# Patient Record
Sex: Female | Born: 1935
Health system: Southern US, Community
[De-identification: ages and names within clinical notes are randomized; demographics above are authoritative.]

## PROBLEM LIST (undated history)

## (undated) DIAGNOSIS — L80 Vitiligo: Secondary | ICD-10-CM

## (undated) DIAGNOSIS — K5730 Diverticulosis of large intestine without perforation or abscess without bleeding: Secondary | ICD-10-CM

## (undated) DIAGNOSIS — K589 Irritable bowel syndrome without diarrhea: Secondary | ICD-10-CM

## (undated) DIAGNOSIS — K635 Polyp of colon: Secondary | ICD-10-CM

## (undated) DIAGNOSIS — J3489 Other specified disorders of nose and nasal sinuses: Secondary | ICD-10-CM

## (undated) DIAGNOSIS — C50919 Malignant neoplasm of unspecified site of unspecified female breast: Secondary | ICD-10-CM

## (undated) DIAGNOSIS — K648 Other hemorrhoids: Secondary | ICD-10-CM

## (undated) DIAGNOSIS — M1990 Unspecified osteoarthritis, unspecified site: Secondary | ICD-10-CM

## (undated) DIAGNOSIS — Z972 Presence of dental prosthetic device (complete) (partial): Secondary | ICD-10-CM

## (undated) DIAGNOSIS — E7800 Pure hypercholesterolemia, unspecified: Secondary | ICD-10-CM

## (undated) DIAGNOSIS — H9190 Unspecified hearing loss, unspecified ear: Secondary | ICD-10-CM

## (undated) DIAGNOSIS — E039 Hypothyroidism, unspecified: Secondary | ICD-10-CM

## (undated) DIAGNOSIS — F419 Anxiety disorder, unspecified: Secondary | ICD-10-CM

## (undated) DIAGNOSIS — Z800 Family history of malignant neoplasm of digestive organs: Secondary | ICD-10-CM

## (undated) DIAGNOSIS — Z973 Presence of spectacles and contact lenses: Secondary | ICD-10-CM

## (undated) DIAGNOSIS — M1711 Unilateral primary osteoarthritis, right knee: Secondary | ICD-10-CM

## (undated) HISTORY — PX: BUNIONECTOMY: SHX129

## (undated) HISTORY — PX: OTHER SURGICAL HISTORY: SHX169

## (undated) HISTORY — PX: ABDOMINAL HYSTERECTOMY: SHX81

## (undated) HISTORY — PX: TONSILLECTOMY: SUR1361

## (undated) HISTORY — DX: Unspecified osteoarthritis, unspecified site: M19.90

## (undated) HISTORY — DX: Family history of malignant neoplasm of digestive organs: Z80.0

## (undated) HISTORY — DX: Unilateral primary osteoarthritis, right knee: M17.11

## (undated) HISTORY — DX: Other hemorrhoids: K64.8

## (undated) HISTORY — DX: Polyp of colon: K63.5

## (undated) HISTORY — PX: EYE SURGERY: SHX253

## (undated) HISTORY — DX: Diverticulosis of large intestine without perforation or abscess without bleeding: K57.30

---

## 1999-09-10 ENCOUNTER — Encounter: Admission: RE | Admit: 1999-09-10 | Discharge: 1999-09-10 | Payer: Self-pay | Admitting: Family Medicine

## 1999-09-10 ENCOUNTER — Encounter: Payer: Self-pay | Admitting: Family Medicine

## 2001-01-06 ENCOUNTER — Encounter: Admission: RE | Admit: 2001-01-06 | Discharge: 2001-01-06 | Payer: Self-pay | Admitting: Family Medicine

## 2001-01-06 ENCOUNTER — Encounter: Payer: Self-pay | Admitting: Family Medicine

## 2001-03-02 HISTORY — PX: WRIST FRACTURE SURGERY: SHX121

## 2001-03-02 HISTORY — PX: OTHER SURGICAL HISTORY: SHX169

## 2001-03-22 ENCOUNTER — Encounter: Payer: Self-pay | Admitting: Family Medicine

## 2001-03-22 ENCOUNTER — Encounter: Admission: RE | Admit: 2001-03-22 | Discharge: 2001-03-22 | Payer: Self-pay | Admitting: Family Medicine

## 2001-05-20 ENCOUNTER — Encounter: Admission: RE | Admit: 2001-05-20 | Discharge: 2001-05-20 | Payer: Self-pay | Admitting: Orthopaedic Surgery

## 2001-05-20 ENCOUNTER — Encounter: Payer: Self-pay | Admitting: Orthopaedic Surgery

## 2001-05-24 ENCOUNTER — Ambulatory Visit (HOSPITAL_BASED_OUTPATIENT_CLINIC_OR_DEPARTMENT_OTHER): Admission: RE | Admit: 2001-05-24 | Discharge: 2001-05-24 | Payer: Self-pay | Admitting: Orthopaedic Surgery

## 2001-11-01 ENCOUNTER — Ambulatory Visit (HOSPITAL_BASED_OUTPATIENT_CLINIC_OR_DEPARTMENT_OTHER): Admission: RE | Admit: 2001-11-01 | Discharge: 2001-11-01 | Payer: Self-pay | Admitting: Orthopaedic Surgery

## 2002-03-02 HISTORY — PX: BREAST LUMPECTOMY: SHX2

## 2002-03-24 ENCOUNTER — Encounter: Payer: Self-pay | Admitting: Internal Medicine

## 2002-03-24 ENCOUNTER — Encounter: Admission: RE | Admit: 2002-03-24 | Discharge: 2002-03-24 | Payer: Self-pay | Admitting: Internal Medicine

## 2002-03-30 ENCOUNTER — Other Ambulatory Visit: Admission: RE | Admit: 2002-03-30 | Discharge: 2002-03-30 | Payer: Self-pay | Admitting: Diagnostic Radiology

## 2002-03-30 ENCOUNTER — Encounter (INDEPENDENT_AMBULATORY_CARE_PROVIDER_SITE_OTHER): Payer: Self-pay | Admitting: Specialist

## 2002-03-30 ENCOUNTER — Encounter: Admission: RE | Admit: 2002-03-30 | Discharge: 2002-03-30 | Payer: Self-pay | Admitting: Internal Medicine

## 2002-03-30 ENCOUNTER — Encounter: Payer: Self-pay | Admitting: Internal Medicine

## 2002-04-04 ENCOUNTER — Encounter: Payer: Self-pay | Admitting: Surgery

## 2002-04-04 ENCOUNTER — Ambulatory Visit (HOSPITAL_COMMUNITY): Admission: RE | Admit: 2002-04-04 | Discharge: 2002-04-04 | Payer: Self-pay | Admitting: Surgery

## 2002-04-17 ENCOUNTER — Encounter: Admission: RE | Admit: 2002-04-17 | Discharge: 2002-04-17 | Payer: Self-pay | Admitting: Surgery

## 2002-04-17 ENCOUNTER — Encounter: Payer: Self-pay | Admitting: Surgery

## 2002-04-17 ENCOUNTER — Encounter (INDEPENDENT_AMBULATORY_CARE_PROVIDER_SITE_OTHER): Payer: Self-pay | Admitting: Specialist

## 2002-04-17 ENCOUNTER — Ambulatory Visit (HOSPITAL_BASED_OUTPATIENT_CLINIC_OR_DEPARTMENT_OTHER): Admission: RE | Admit: 2002-04-17 | Discharge: 2002-04-17 | Payer: Self-pay | Admitting: Surgery

## 2002-04-17 ENCOUNTER — Encounter: Payer: Self-pay | Admitting: General Surgery

## 2002-05-02 ENCOUNTER — Ambulatory Visit: Admission: RE | Admit: 2002-05-02 | Discharge: 2002-07-06 | Payer: Self-pay | Admitting: Radiation Oncology

## 2002-05-09 ENCOUNTER — Ambulatory Visit (HOSPITAL_COMMUNITY): Admission: RE | Admit: 2002-05-09 | Discharge: 2002-05-09 | Payer: Self-pay | Admitting: Oncology

## 2002-05-09 ENCOUNTER — Encounter: Payer: Self-pay | Admitting: Oncology

## 2002-07-27 ENCOUNTER — Encounter: Admission: RE | Admit: 2002-07-27 | Discharge: 2002-07-27 | Payer: Self-pay | Admitting: Oncology

## 2002-07-27 ENCOUNTER — Encounter: Payer: Self-pay | Admitting: Oncology

## 2002-09-05 ENCOUNTER — Other Ambulatory Visit: Admission: RE | Admit: 2002-09-05 | Discharge: 2002-09-05 | Payer: Self-pay | Admitting: Internal Medicine

## 2002-10-26 ENCOUNTER — Encounter: Admission: RE | Admit: 2002-10-26 | Discharge: 2002-10-26 | Payer: Self-pay | Admitting: General Surgery

## 2002-10-26 ENCOUNTER — Encounter: Payer: Self-pay | Admitting: General Surgery

## 2002-11-08 ENCOUNTER — Encounter (INDEPENDENT_AMBULATORY_CARE_PROVIDER_SITE_OTHER): Payer: Self-pay | Admitting: *Deleted

## 2002-11-08 DIAGNOSIS — K648 Other hemorrhoids: Secondary | ICD-10-CM

## 2003-03-27 ENCOUNTER — Encounter: Admission: RE | Admit: 2003-03-27 | Discharge: 2003-03-27 | Payer: Self-pay | Admitting: General Surgery

## 2003-12-13 ENCOUNTER — Ambulatory Visit (HOSPITAL_COMMUNITY): Admission: RE | Admit: 2003-12-13 | Discharge: 2003-12-13 | Payer: Self-pay | Admitting: Oncology

## 2004-04-02 ENCOUNTER — Ambulatory Visit: Payer: Self-pay | Admitting: Oncology

## 2004-04-04 ENCOUNTER — Encounter: Admission: RE | Admit: 2004-04-04 | Discharge: 2004-04-04 | Payer: Self-pay | Admitting: General Surgery

## 2004-10-09 ENCOUNTER — Ambulatory Visit (HOSPITAL_COMMUNITY): Admission: RE | Admit: 2004-10-09 | Discharge: 2004-10-09 | Payer: Self-pay | Admitting: Internal Medicine

## 2005-04-02 ENCOUNTER — Ambulatory Visit: Payer: Self-pay | Admitting: Oncology

## 2005-04-16 ENCOUNTER — Encounter: Admission: RE | Admit: 2005-04-16 | Discharge: 2005-04-16 | Payer: Self-pay | Admitting: Oncology

## 2005-09-11 ENCOUNTER — Other Ambulatory Visit: Admission: RE | Admit: 2005-09-11 | Discharge: 2005-09-11 | Payer: Self-pay | Admitting: Internal Medicine

## 2006-03-29 ENCOUNTER — Ambulatory Visit: Payer: Self-pay | Admitting: Oncology

## 2006-04-01 LAB — CBC WITH DIFFERENTIAL/PLATELET
BASO%: 0.6 % (ref 0.0–2.0)
Basophils Absolute: 0 10*3/uL (ref 0.0–0.1)
EOS%: 1.7 % (ref 0.0–7.0)
HGB: 13.2 g/dL (ref 11.6–15.9)
MCH: 30.6 pg (ref 26.0–34.0)
MCHC: 34.6 g/dL (ref 32.0–36.0)
MCV: 88.5 fL (ref 81.0–101.0)
MONO%: 9.1 % (ref 0.0–13.0)
RBC: 4.32 10*6/uL (ref 3.70–5.32)
RDW: 13.3 % (ref 11.3–14.5)
lymph#: 1.6 10*3/uL (ref 0.9–3.3)

## 2006-04-01 LAB — COMPREHENSIVE METABOLIC PANEL
AST: 15 U/L (ref 0–37)
Albumin: 4.3 g/dL (ref 3.5–5.2)
Alkaline Phosphatase: 56 U/L (ref 39–117)
Glucose, Bld: 93 mg/dL (ref 70–99)
Potassium: 4.2 mEq/L (ref 3.5–5.3)
Sodium: 143 mEq/L (ref 135–145)
Total Bilirubin: 0.8 mg/dL (ref 0.3–1.2)
Total Protein: 6.8 g/dL (ref 6.0–8.3)

## 2006-04-01 LAB — CANCER ANTIGEN 27.29: CA 27.29: 24 U/mL (ref 0–39)

## 2006-04-01 LAB — LIPID PANEL
LDL Cholesterol: 148 mg/dL — ABNORMAL HIGH (ref 0–99)
Total CHOL/HDL Ratio: 4.4 Ratio
Triglycerides: 207 mg/dL — ABNORMAL HIGH (ref <150)
VLDL: 41 mg/dL — ABNORMAL HIGH (ref 0–40)

## 2006-04-12 ENCOUNTER — Encounter: Admission: RE | Admit: 2006-04-12 | Discharge: 2006-04-12 | Payer: Self-pay | Admitting: Oncology

## 2006-04-19 ENCOUNTER — Encounter: Admission: RE | Admit: 2006-04-19 | Discharge: 2006-04-19 | Payer: Self-pay | Admitting: Oncology

## 2006-04-26 ENCOUNTER — Encounter: Admission: RE | Admit: 2006-04-26 | Discharge: 2006-04-26 | Payer: Self-pay | Admitting: Oncology

## 2006-10-05 ENCOUNTER — Ambulatory Visit: Payer: Self-pay | Admitting: Oncology

## 2006-10-07 LAB — CBC WITH DIFFERENTIAL/PLATELET
BASO%: 0.4 % (ref 0.0–2.0)
Basophils Absolute: 0 10*3/uL (ref 0.0–0.1)
EOS%: 2.4 % (ref 0.0–7.0)
HGB: 12.9 g/dL (ref 11.6–15.9)
MCH: 30.5 pg (ref 26.0–34.0)
RBC: 4.22 10*6/uL (ref 3.70–5.32)
RDW: 13.6 % (ref 11.3–14.5)
lymph#: 1.8 10*3/uL (ref 0.9–3.3)

## 2006-10-07 LAB — COMPREHENSIVE METABOLIC PANEL
ALT: 14 U/L (ref 0–35)
AST: 16 U/L (ref 0–37)
Albumin: 4 g/dL (ref 3.5–5.2)
Alkaline Phosphatase: 57 U/L (ref 39–117)
BUN: 18 mg/dL (ref 6–23)
Calcium: 9.5 mg/dL (ref 8.4–10.5)
Chloride: 106 mEq/L (ref 96–112)
Potassium: 4 mEq/L (ref 3.5–5.3)
Sodium: 143 mEq/L (ref 135–145)
Total Protein: 6.6 g/dL (ref 6.0–8.3)

## 2007-03-03 DIAGNOSIS — K5730 Diverticulosis of large intestine without perforation or abscess without bleeding: Secondary | ICD-10-CM

## 2007-03-03 DIAGNOSIS — K635 Polyp of colon: Secondary | ICD-10-CM

## 2007-03-03 HISTORY — PX: CYSTOCELE REPAIR: SHX163

## 2007-03-03 HISTORY — DX: Diverticulosis of large intestine without perforation or abscess without bleeding: K57.30

## 2007-03-03 HISTORY — PX: COLONOSCOPY: SHX174

## 2007-03-03 HISTORY — DX: Polyp of colon: K63.5

## 2007-04-05 ENCOUNTER — Ambulatory Visit: Payer: Self-pay | Admitting: Oncology

## 2007-04-07 LAB — COMPREHENSIVE METABOLIC PANEL
Albumin: 4.2 g/dL (ref 3.5–5.2)
Alkaline Phosphatase: 51 U/L (ref 39–117)
BUN: 17 mg/dL (ref 6–23)
CO2: 25 mEq/L (ref 19–32)
Glucose, Bld: 107 mg/dL — ABNORMAL HIGH (ref 70–99)
Potassium: 4 mEq/L (ref 3.5–5.3)
Sodium: 141 mEq/L (ref 135–145)
Total Protein: 6.4 g/dL (ref 6.0–8.3)

## 2007-04-07 LAB — CBC WITH DIFFERENTIAL/PLATELET
Basophils Absolute: 0 10*3/uL (ref 0.0–0.1)
Eosinophils Absolute: 0.1 10*3/uL (ref 0.0–0.5)
HGB: 12.6 g/dL (ref 11.6–15.9)
LYMPH%: 30.9 % (ref 14.0–48.0)
MCV: 87.3 fL (ref 81.0–101.0)
MONO#: 0.5 10*3/uL (ref 0.1–0.9)
MONO%: 11.1 % (ref 0.0–13.0)
NEUT#: 2.6 10*3/uL (ref 1.5–6.5)
Platelets: 205 10*3/uL (ref 145–400)
RDW: 12.9 % (ref 11.3–14.5)
WBC: 4.7 10*3/uL (ref 3.9–10.0)

## 2007-04-07 LAB — LACTATE DEHYDROGENASE: LDH: 146 U/L (ref 94–250)

## 2007-04-07 LAB — CANCER ANTIGEN 27.29: CA 27.29: 13 U/mL (ref 0–39)

## 2007-04-27 ENCOUNTER — Encounter: Admission: RE | Admit: 2007-04-27 | Discharge: 2007-04-27 | Payer: Self-pay | Admitting: Oncology

## 2007-09-16 ENCOUNTER — Encounter (INDEPENDENT_AMBULATORY_CARE_PROVIDER_SITE_OTHER): Payer: Self-pay | Admitting: Urology

## 2007-09-16 ENCOUNTER — Ambulatory Visit (HOSPITAL_BASED_OUTPATIENT_CLINIC_OR_DEPARTMENT_OTHER): Admission: RE | Admit: 2007-09-16 | Discharge: 2007-09-16 | Payer: Self-pay | Admitting: Urology

## 2007-12-05 DIAGNOSIS — E039 Hypothyroidism, unspecified: Secondary | ICD-10-CM

## 2007-12-05 DIAGNOSIS — M1990 Unspecified osteoarthritis, unspecified site: Secondary | ICD-10-CM

## 2007-12-07 ENCOUNTER — Ambulatory Visit: Payer: Self-pay | Admitting: Gastroenterology

## 2007-12-07 DIAGNOSIS — Z853 Personal history of malignant neoplasm of breast: Secondary | ICD-10-CM

## 2008-01-10 ENCOUNTER — Ambulatory Visit: Payer: Self-pay | Admitting: Gastroenterology

## 2008-01-12 ENCOUNTER — Encounter: Payer: Self-pay | Admitting: Gastroenterology

## 2008-01-12 ENCOUNTER — Ambulatory Visit: Payer: Self-pay | Admitting: Gastroenterology

## 2008-01-16 ENCOUNTER — Encounter: Payer: Self-pay | Admitting: Gastroenterology

## 2008-04-04 ENCOUNTER — Ambulatory Visit: Payer: Self-pay | Admitting: Oncology

## 2008-04-05 LAB — CBC WITH DIFFERENTIAL/PLATELET
BASO%: 0.5 % (ref 0.0–2.0)
EOS%: 1.7 % (ref 0.0–7.0)
HCT: 39.1 % (ref 34.8–46.6)
MCH: 30.1 pg (ref 26.0–34.0)
MCHC: 34.2 g/dL (ref 32.0–36.0)
MONO#: 0.5 10*3/uL (ref 0.1–0.9)
NEUT%: 65.2 % (ref 39.6–76.8)
RBC: 4.44 10*6/uL (ref 3.70–5.32)
RDW: 13.6 % (ref 11.3–14.5)
WBC: 6.1 10*3/uL (ref 3.9–10.0)
lymph#: 1.5 10*3/uL (ref 0.9–3.3)

## 2008-04-06 LAB — COMPREHENSIVE METABOLIC PANEL
ALT: 12 U/L (ref 0–35)
AST: 15 U/L (ref 0–37)
CO2: 26 mEq/L (ref 19–32)
Calcium: 9.6 mg/dL (ref 8.4–10.5)
Chloride: 103 mEq/L (ref 96–112)
Creatinine, Ser: 0.78 mg/dL (ref 0.40–1.20)
Sodium: 139 mEq/L (ref 135–145)
Total Protein: 6.7 g/dL (ref 6.0–8.3)

## 2008-04-30 ENCOUNTER — Encounter: Admission: RE | Admit: 2008-04-30 | Discharge: 2008-04-30 | Payer: Self-pay | Admitting: Oncology

## 2009-04-12 ENCOUNTER — Ambulatory Visit: Payer: Self-pay | Admitting: Oncology

## 2009-04-12 LAB — CANCER ANTIGEN 27.29: CA 27.29: 15 U/mL (ref 0–39)

## 2009-04-12 LAB — COMPREHENSIVE METABOLIC PANEL
ALT: 18 U/L (ref 0–35)
Albumin: 4.4 g/dL (ref 3.5–5.2)
CO2: 29 mEq/L (ref 19–32)
Calcium: 9.9 mg/dL (ref 8.4–10.5)
Chloride: 105 mEq/L (ref 96–112)
Glucose, Bld: 84 mg/dL (ref 70–99)
Potassium: 4.3 mEq/L (ref 3.5–5.3)
Sodium: 143 mEq/L (ref 135–145)
Total Bilirubin: 1.1 mg/dL (ref 0.3–1.2)
Total Protein: 6.8 g/dL (ref 6.0–8.3)

## 2009-04-12 LAB — LACTATE DEHYDROGENASE: LDH: 168 U/L (ref 94–250)

## 2009-04-12 LAB — CBC WITH DIFFERENTIAL/PLATELET
Basophils Absolute: 0 10*3/uL (ref 0.0–0.1)
EOS%: 1.9 % (ref 0.0–7.0)
Eosinophils Absolute: 0.1 10*3/uL (ref 0.0–0.5)
HCT: 41.5 % (ref 34.8–46.6)
HGB: 13.9 g/dL (ref 11.6–15.9)
MCH: 30.2 pg (ref 25.1–34.0)
NEUT#: 4.2 10*3/uL (ref 1.5–6.5)
NEUT%: 62.4 % (ref 38.4–76.8)
RDW: 13.6 % (ref 11.2–14.5)
lymph#: 1.9 10*3/uL (ref 0.9–3.3)

## 2009-05-01 ENCOUNTER — Encounter: Admission: RE | Admit: 2009-05-01 | Discharge: 2009-05-01 | Payer: Self-pay | Admitting: Oncology

## 2009-09-19 ENCOUNTER — Other Ambulatory Visit: Admission: RE | Admit: 2009-09-19 | Discharge: 2009-09-19 | Payer: Self-pay | Admitting: Internal Medicine

## 2009-10-03 ENCOUNTER — Ambulatory Visit (HOSPITAL_COMMUNITY): Admission: RE | Admit: 2009-10-03 | Discharge: 2009-10-03 | Payer: Self-pay | Admitting: Internal Medicine

## 2009-10-17 ENCOUNTER — Ambulatory Visit (HOSPITAL_COMMUNITY): Admission: RE | Admit: 2009-10-17 | Discharge: 2009-10-17 | Payer: Self-pay | Admitting: Internal Medicine

## 2010-03-23 ENCOUNTER — Encounter: Payer: Self-pay | Admitting: Oncology

## 2010-03-23 ENCOUNTER — Encounter: Payer: Self-pay | Admitting: Internal Medicine

## 2010-03-28 ENCOUNTER — Other Ambulatory Visit: Payer: Self-pay | Admitting: Internal Medicine

## 2010-03-28 DIAGNOSIS — Z1239 Encounter for other screening for malignant neoplasm of breast: Secondary | ICD-10-CM

## 2010-05-07 ENCOUNTER — Ambulatory Visit
Admission: RE | Admit: 2010-05-07 | Discharge: 2010-05-07 | Disposition: A | Discharge status: Home / Self Care | Payer: MEDICARE | Source: Ambulatory Visit | Attending: Internal Medicine | Admitting: Internal Medicine

## 2010-05-07 DIAGNOSIS — Z1239 Encounter for other screening for malignant neoplasm of breast: Secondary | ICD-10-CM

## 2010-07-15 NOTE — Op Note (Signed)
NAME:  Alexandria Davies, Alexandria Davies                 ACCOUNT NO.:  [id number]   MEDICAL RECORD NO.:  [id number]           PATIENT TYPE:   LOCATION:                                 FACILITY:   PHYSICIAN:  Sigmund I. Tannenbaum, M.D.DATE OF BIRTH:  1925/11/21   DATE OF PROCEDURE:  DATE OF DISCHARGE:                               OPERATIVE REPORT   PREOPERATIVE DIAGNOSES:  Complete pelvic vault prolapse with grade 4  cystocele and possible enterocele.   POSTOPERATIVE DIAGNOSES:  Grade 4 cystocele, enterocele.   OPERATION:  Anterior vaginal vault repair, apical repair and enterocele  repair using Capio device and Public affairs consultant.   SURGEON:  Sigmund I. Patsi Sears, M.D.   ANESTHESIA:  General endotracheal.   FIRST ASSISTANT:  Delman Kitten, M.D.   PREPARATION:  After appropriate preanesthesia the patient was brought to  the operating room and placed on the operating table in dorsal supine  position where general endotracheal anesthesia was introduced.  She was  then replaced in dorsal lithotomy position where the pubis was prepped  with Betadine solution and draped in the usual fashion.   BRIEF HISTORY:  This 77 year old female has a history of hysterectomy  and bladder tacking in 1976 for benign reasons.  She is para 4-4-0,  uses no tobacco, has a stable weight at 142 pounds.  The patient denies  leakage with cough/sneeze, but has a history of urge incontinence.  The  patient has had urodynamics done which showed that she has a 500 mL  capacity, a compliant but hypersensitive bladder.  The bladder does  appear stable and she did not leak for Valsalva leak point pressure at  an abdominal generated pressure of 123 cm of water.  She is now for  pelvic floor repair.   PROCEDURE IN DETAIL:  Vaginal inspection reveals the patient has a grade  4 cystourethrocele.  There is also a large midline mass which is  posterior to the apex.  The apex was delineated with a 2-0 Vicryl suture  between two  Allis clamps.  Xylocaine with epinephrine 1:200,000 was  injected in the vaginal epithelial tissue and a 10 cm midline incision  is accomplished.  Subcutaneous tissue was dissected with both blunt and  sharp dissection.  A large cystocele was identified but there was also  an enterocele identified.  The enterocele was dissected proximally and  closed with 2-0 Vicryl sutures.  Following this dissection was  accomplished until the ischial spines were identified bilaterally.  The  sacrospinous ligament was then identified bilaterally and using the  Capio device the Pinnacle mesh system was placed bilaterally.  The  anterior portion of the Pinnacle device was placed with the Capio device  placing sutures in the arcus tendineus.  The mesh was advanced and  redundant mesh was excised.  The edges of the mesh were then sutured to  the vaginal epithelium and the pelvic floor with 2-0 Vicryl suture.  Following this, the wings of the Pinnacle mesh were cut in a standard  fashion and removed.  Redundant wings of mesh were also excised.  Excellent  release of the grade 4 cystocele and the enterocele were  accomplished using the Pinnacle mesh system.  Because of the large  amount of redundant vaginal mucosa, the patient's advanced age, it  elected to excise some of the redundant vaginal epithelium and this was  accomplished.  The vaginal epithelium was then closed with 2-0 Vicryl  running suture.  Estrace cream packing was placed.  Cystoscopy revealed  normal bladder and it was noted that blue contrast was identified from  each orifice after giving indigo carmine.   It is noted that at the beginning of the case an erythematous irregular  area was biopsied from the external urethra.  This appeared to be  atrophic excoriated tissue only.  Pathology is pending.  The base of the  area was cauterized.  The patient was taken to the recovery room in good  condition.      Sigmund I. Patsi Sears, M.D.   Electronically Signed     SIT/MEDQ  D:  09/16/2007  T:  09/16/2007  Job:  [id number]   cc:   Lovenia Kim, D.O.  Fax: [phone]

## 2010-07-18 NOTE — Op Note (Signed)
NAME:  Alexandria Davies, Alexandria Davies                           ACCOUNT NO.:  [id number]   MEDICAL RECORD NO.:  [id number]                   PATIENT TYPE:  AMB   LOCATION:  DSC                                  FACILITY:  [facility]   PHYSICIAN:  Lubertha Basque. Jerl Santos, M.D.             DATE OF BIRTH:  04/18/1925   DATE OF PROCEDURE:  11/01/2001  DATE OF DISCHARGE:                                 OPERATIVE REPORT   PREOPERATIVE DIAGNOSES:  1. Left wrist cyst.  2. Left forearm lipoma.  3. Right shoulder acromioclavicular pain.   POSTOPERATIVE DIAGNOSES:  1. Left wrist cyst.  2. Left forearm lipoma.  3. Right shoulder acromioclavicular pain.   PROCEDURES:  1. Left wrist cyst excision.  2. Left forearm lipoma excision.  3. Right shoulder acromioclavicular injection.   ANESTHESIA:  MAC and Bier block.   SURGEON:  Lubertha Basque. Jerl Santos, M.D.   ASSISTANT:  Prince Rome, P.A.   INDICATIONS:  The patient is a 77 year old woman with a long history of  right wrist pain stemming from an accident.  She has had a swelling on the  volar aspect of her wrist since that time which has persisted.  She has  undergone thorough workup and has been treated with bracing and anti-  inflammatories.  Unfortunately, this continues to bother her.  She is  offered an outpatient excision.  She adds that she has a mass on her forearm  that she would like to have removed at the same time, and this appeared to  be a lipoma, and she is offered excision of this as well under the same  anesthetic.  Her right shoulder was again bothering her but at this point on  the top near the [facility] joint, and we agreed to have an injection done during  this anesthetic.  The procedures were discussed with the patient and  informed operative consent was obtained after a discussion of the possible  complications of, reaction to the anesthesia, infection, and recurrence, as  well as neurovascular injury.   DESCRIPTION OF PROCEDURE:  The patient was  taken to the operating suite,  where a Bier block was applied to the level of her upper arm.  She was also  given MAC.  She was positioned supine and prepped and draped in a normal  sterile fashion.  After the administration of preop IV antibiotics, a small  incision was made over a cyst on the volar aspect of her wrist.  Dissection  was carried down to a small gelatinous-filled cyst, which was excised along  with some overlying scar tissue.  The radial artery was retracted in a  radial direction.  This wound was irrigated, followed by use of Bovie  cautery to control some mild bleeding.  Skin was then closed primarily with  nylon.  Further up her arm on the ulnar aspect of the forearm, a several  centimeter incision  was made over a mass.  Some normal-appearing fatty  tissue was removed consistent with a lipoma.  This wound was then irrigated,  followed by closure with nylon.  Adaptic was applied to the dressings,  followed by dry gauze and a loose Ace wrap.  The tourniquet was deflated,  and her hand became pink and warm immediately.  At this point the right  shoulder was prepped and an AC injection was done with 1 cc of Depo-Medrol  and 1 cc of Xylocaine.  A Band-Aid was applied here.   DISPOSITION:  The patient was taken to the recovery room in stable  condition.  Plans were for her to go home the same day and follow up in the  office in less than a week.  I will contact her by phone tonight.                                               Lubertha Basque Jerl Santos, M.D.    PGD/MEDQ  D:  11/01/2001  T:  11/01/2001  Job:  [id number]

## 2010-07-18 NOTE — Op Note (Signed)
NAME:  Alexandria Davies, Alexandria Davies                           ACCOUNT NO.:  [id number]   MEDICAL RECORD NO.:  [id number]                   PATIENT TYPE:  AMB   LOCATION:  DSC                                  FACILITY:  [facility]   PHYSICIAN:  Rose Phi. Maple Hudson, M.D.                DATE OF BIRTH:  11/15/1925   DATE OF PROCEDURE:  04/17/2002  DATE OF DISCHARGE:                                 OPERATIVE REPORT   PREOPERATIVE DIAGNOSIS:  Stage-1 carcinoma of the left breast.   POSTOPERATIVE DIAGNOSIS:  Stage-1 carcinoma of the left breast.   OPERATION:  1. Blue dye injection.  2. Left partial mastectomy with needle localization and specimen     mammography.  3. Left sentinel node biopsy.   SURGEON:  Rose Phi. Maple Hudson, M.D.   ANESTHESIA:  General.   OPERATIVE PROCEDURE:  Prior to coming to the operating room, the patient had  a wire localization of a biopsy-proven carcinoma at the 5 o'clock position  of her left breast.  In addition, 1 mCi of technetium sulfur colloid was  injected intradermally.   After suitable general anesthesia was induced, the patient was placed in the  supine position with the left arm extended on the arm board.  Next, 4 mL of  Lymphazurin blue was injected in the subareolar tissue and the breast gently  massaged for about three minutes.   We then prepped and draped the breast, shoulder and axilla.  A curved  incision using the previously placed wire for the lesion at the 5 o'clock  position was then made, and the wire and surrounding tissue were excised.  Specimen was oriented for the pathologist.  Specimen mammography confirmed  the removal of a lesion.  Touch preps of the margins were thought to be  clear.   While that part was being done, we scanned the axilla and there was one good  hot spot.  A short, transverse, axillary incision was made with dissection  through the subcutaneous tissue to the clavipectoral fascia.  Just deep to  the clavipectoral fascia was an enlarged  blue and very hot lymph node.  This  was removed by clipping the lymphatics and removing the specimen and  submitting it to the pathologist.  There were no other blue hot or palpable  nodes.  Touch preps on the nodes were negative.  Incisions were then closed  with 3-0 Vicryl and subcuticular 4-0 Monocryl and Steri-Strips.  Dressings  applied.  Patient transferred to the recovery room in satisfactory condition  having tolerated the procedure well.                                               Rose Phi. Maple Hudson, M.D.    PRY/MEDQ  D:  04/17/2002  T:  04/17/2002  Job:  [id number]

## 2010-07-18 NOTE — Op Note (Signed)
[place]  Patient:    Alexandria Davies, Alexandria Davies Visit Number: [id number] MRN: [id number]          Service Type: DSU Location: [facility] Attending Physician:  Marcene Corning Dictated by:   Lubertha Basque. Jerl Santos, M.D. Proc. Date: 05/24/01 Admit Date:  05/24/2001                             Operative Report  PREOPERATIVE DIAGNOSIS: 1. Right shoulder impingement. 2. Right shoulder rotator cuff tear.  POSTOPERATIVE DIAGNOSIS: 1. Right shoulder impingement. 2. Right shoulder acromioclavicular spur. 3. Right shoulder rotator cuff tear.  OPERATION PERFORMED: 1. Right shoulder arthroscopic acromioplasty. 2. Right shoulder partial clavulectomy. 3. Right shoulder debridement, rotator cuff.  ANESTHESIA:  General.  ATTENDING SURGEON:  Lubertha Basque. Jerl Santos, M.D.  ASSISTANT:  Lindwood Qua, P.A.  INDICATIONS FOR PROCEDURE:  The patient is a 77 year old woman with a long history of right shoulder pain.  This has responded in a transient way to subacromial injection x 3.  She has undergone a preoperative MRI scan which shows some significant rotator cuff degeneration with several full thickness tears small in size.  At this point she is offered arthroscopic intervention. The procedure was discussed with the patient and informed operative consent was obtained after discussion of possible complications of reaction to anesthesia and infection.  DESCRIPTION OF PROCEDURE:  The patient was taken to an operating suite where general anesthetic was applied without difficulty.  She was then positioned in beach chair position and prepped and draped in normal sterile fashion.  After administration of preop intravenous antibiotics, an arthroscopy of the right right shoulder was performed through a total of two portals.  The glenohumeral joint showed no degenerative change and the biceps tendon and all labral structures were intact.  The rotator cuff appeared benign from below.   The subacromial space she had a great deal of bursitis which was excised.  She had a prominent subacromial spur which was addressed with an acromioplasty back to a flat surface.  This was done with the bur in the lateral position followed by transfer of the bur to the posterior position.  She had some prominence of the distal clavicle addressed with a partial clavulectomy but a formal AC decompression was not done.  The rotator cuff was then thoroughly examined. She had some significant tendinosis with 50% partial thickness tears through a good portion of the supraspinatus and infraspinatus from the insertion point to about 2 cm proximal.  There were some small areas of full thickness tearing as well and one which measured 5 or 6 mm at the insertion point on the greater tuberosity.  It was felt with all this degeneration in the tendon that a repair would be futile and doomed to failure.  Our hope is that with decompression, these will gradually heal or at least become asymptomatic as she did respond in a transient way to injection.  The shoulder was thoroughly irrigated at the of the case followed by placement of Marcaine with morphine. No epinephrine was placed.  Simple sutures of nylon were used to loosely reapproximate the portals followed by Adaptic and a dry gauze dressing with tape.  Estimated blood loss and intraoperative fluids can be obtained from Anesthesia records.  DISPOSITION:  The patient was extubated in the operating room and taken to the recovery room in stable condition.  Plans were for her to go home the same day and to  follow up in the office in less than a week.  I will contact her by phone tonight. Dictated by:   Lubertha Basque Jerl Santos, M.D. Attending Physician:  Marcene Corning DD:  05/24/01 TD:  05/24/01 Job: [id number]

## 2010-07-21 ENCOUNTER — Other Ambulatory Visit: Payer: Self-pay | Admitting: Dermatology

## 2011-02-17 ENCOUNTER — Encounter (HOSPITAL_COMMUNITY): Payer: Self-pay | Admitting: *Deleted

## 2011-02-17 ENCOUNTER — Emergency Department (HOSPITAL_COMMUNITY)
Admission: EM | Admit: 2011-02-17 | Discharge: 2011-02-17 | Disposition: A | Discharge status: Home / Self Care | Payer: Medicare Other | Attending: Emergency Medicine | Admitting: Emergency Medicine

## 2011-02-17 ENCOUNTER — Emergency Department (HOSPITAL_COMMUNITY): Payer: Medicare Other

## 2011-02-17 DIAGNOSIS — E7800 Pure hypercholesterolemia, unspecified: Secondary | ICD-10-CM | POA: Insufficient documentation

## 2011-02-17 DIAGNOSIS — S60211A Contusion of right wrist, initial encounter: Secondary | ICD-10-CM

## 2011-02-17 DIAGNOSIS — Z853 Personal history of malignant neoplasm of breast: Secondary | ICD-10-CM | POA: Insufficient documentation

## 2011-02-17 DIAGNOSIS — Z9010 Acquired absence of unspecified breast and nipple: Secondary | ICD-10-CM | POA: Insufficient documentation

## 2011-02-17 DIAGNOSIS — M25539 Pain in unspecified wrist: Secondary | ICD-10-CM | POA: Insufficient documentation

## 2011-02-17 DIAGNOSIS — E039 Hypothyroidism, unspecified: Secondary | ICD-10-CM | POA: Insufficient documentation

## 2011-02-17 DIAGNOSIS — W1809XA Striking against other object with subsequent fall, initial encounter: Secondary | ICD-10-CM | POA: Insufficient documentation

## 2011-02-17 DIAGNOSIS — S60219A Contusion of unspecified wrist, initial encounter: Secondary | ICD-10-CM | POA: Insufficient documentation

## 2011-02-17 HISTORY — DX: Malignant neoplasm of unspecified site of unspecified female breast: C50.919

## 2011-02-17 HISTORY — DX: Pure hypercholesterolemia, unspecified: E78.00

## 2011-02-17 HISTORY — DX: Hypothyroidism, unspecified: E03.9

## 2011-02-17 NOTE — ED Notes (Signed)
Pt states "stepped on an incline Saturday & fell, want to get the right wrist x-rayed"; pt presents with bruising to RAW, +pp

## 2011-02-19 NOTE — ED Provider Notes (Signed)
History     CSN: [id number] Arrival date & time: 02/17/2011  1:29 PM   First MD Initiated Contact with Patient 02/17/11 1637      Chief Complaint  Patient presents with  . Fall  . Wrist Injury    (Consider location/radiation/quality/duration/timing/severity/associated sxs/prior treatment) Patient is a 77 y.o. female presenting with fall and wrist injury. The history is provided by the patient.  Fall The accident occurred 2 days ago. The fall occurred while standing (she was stepping out of a car when she tripped on an incline,  landing on her outstretched right wrist.). She landed on concrete. There was no blood loss. The point of impact was the right wrist. The pain is at a severity of 4/10. The pain is moderate. She was ambulatory at the scene. Associated symptoms include tingling. Pertinent negatives include no fever, no numbness, no abdominal pain, no nausea, no headaches and no loss of consciousness.  Wrist Injury  Pertinent negatives include no fever.    Past Medical History  Diagnosis Date  . Breast cancer   . Hypercholesteremia   . Hypothyroidism     Past Surgical History  Procedure Date  . Abdominal hysterectomy   . Bladder tac   . Bunionectomy   . Breast lumpectomy     left  . Right shoulder     bone spur removed    No family history on file.  History  Substance Use Topics  . Smoking status: Never Smoker   . Smokeless tobacco: Not on file  . Alcohol Use: Yes     ocassionally    OB History    Grav Para Term Preterm Abortions TAB SAB Ect Mult Living                  Review of Systems  Constitutional: Negative for fever.  HENT: Negative for congestion, sore throat and neck pain.   Eyes: Negative.   Respiratory: Negative for chest tightness and shortness of breath.   Cardiovascular: Negative for chest pain.  Gastrointestinal: Negative for nausea and abdominal pain.  Genitourinary: Negative.   Musculoskeletal: Positive for arthralgias. Negative  for joint swelling.  Skin: Negative.  Negative for rash and wound.  Neurological: Positive for tingling. Negative for dizziness, loss of consciousness, weakness, light-headedness, numbness and headaches.  Hematological: Negative.   Psychiatric/Behavioral: Negative.     Allergies  Epinephrine and Etodolac  Home Medications   Current Outpatient Rx  Name Route Sig Dispense Refill  . ALPHA LIPOIC ACID PO Oral Take 100 mg by mouth daily.      Marland Kitchen BIOTIN 1000 MCG PO TABS Oral Take 500 mcg by mouth daily.      Marland Kitchen CALCIUM CARBONATE-VITAMIN D 600-400 MG-UNIT PO TABS Oral Take 1 tablet by mouth daily.      Marland Kitchen VITAMIN D 1000 UNITS PO TABS Oral Take 200 Units by mouth daily.      . IBUPROFEN 200 MG PO TABS Oral Take 200 mg by mouth every 6 (six) hours as needed. For pain     . LEVOTHYROXINE SODIUM 75 MCG PO TABS Oral Take 75 mcg by mouth daily.      Marland Kitchen POTASSIUM GLUCONATE 595 MG PO CAPS Oral Take 595 mg by mouth daily.      Marland Kitchen SIMVASTATIN 20 MG PO TABS Oral Take 20 mg by mouth daily.      . TROLAMINE SALICYLATE 10 % EX CREA Topical Apply 1 application topically 2 (two) times daily as needed. For pain     .  VITAMIN C 500 MG PO TABS Oral Take 500 mg by mouth daily.      Marland Kitchen VITAMIN E 400 UNITS PO CAPS Oral Take 400 Units by mouth daily.        BP 131/83  Pulse 63  Temp(Src) 98.4 F (36.9 C) (Oral)  Resp 20  Wt 147 lb (66.679 kg)  SpO2 99%  Physical Exam  Nursing note and vitals reviewed. Constitutional: She is oriented to person, place, and time. She appears well-developed and well-nourished.  HENT:  Head: Normocephalic.  Eyes: Conjunctivae are normal.  Neck: Normal range of motion.  Cardiovascular: Normal rate and intact distal pulses.  Exam reveals no decreased pulses.   Pulses:      Dorsalis pedis pulses are 2+ on the right side, and 2+ on the left side.       Posterior tibial pulses are 2+ on the right side, and 2+ on the left side.  Pulmonary/Chest: Effort normal.  Musculoskeletal: She  exhibits edema and tenderness.       Right wrist: She exhibits tenderness and swelling. She exhibits normal range of motion, no effusion, no crepitus and no deformity.       Arms: Neurological: She is alert and oriented to person, place, and time. No sensory deficit.  Skin: Skin is warm, dry and intact.    ED Course  Procedures (including critical care time)  Labs Reviewed - No data to display Dg Wrist Complete Right  02/17/2011  *RADIOLOGY REPORT*  Clinical Data: Right wrist pain and bruising secondary to a fall.  RIGHT WRIST - COMPLETE 3+ VIEW  Comparison: None.  Findings: No fracture, dislocation, or other significant abnormality.  IMPRESSION: Normal right wrist.  Original Report Authenticated By: Gwynn Burly, M.D.     1. Contusion of right wrist       MDM  Contusion of right wrist.  Offered ace wrap or wrist splint for comfort.  Pt. Defers.        Candis Musa, PA 02/19/11 [phone]

## 2011-02-20 NOTE — ED Provider Notes (Signed)
Medical screening examination/treatment/procedure(s) were performed by non-physician practitioner and as supervising physician I was immediately available for consultation/collaboration.  Ethelda Chick, MD 02/20/11 [phone]

## 2011-03-30 ENCOUNTER — Other Ambulatory Visit: Payer: Self-pay | Admitting: Internal Medicine

## 2011-03-30 DIAGNOSIS — Z1231 Encounter for screening mammogram for malignant neoplasm of breast: Secondary | ICD-10-CM

## 2011-04-22 ENCOUNTER — Telehealth: Payer: Self-pay | Admitting: Gastroenterology

## 2011-04-22 NOTE — Telephone Encounter (Signed)
Pt scheduled to see Mike Gip PA tomorrow at 9:30am. Lelon Mast to fax records and notify pt of appt date and time.

## 2011-04-23 ENCOUNTER — Ambulatory Visit (INDEPENDENT_AMBULATORY_CARE_PROVIDER_SITE_OTHER): Payer: Medicare Other | Admitting: Physician Assistant

## 2011-04-23 ENCOUNTER — Ambulatory Visit (INDEPENDENT_AMBULATORY_CARE_PROVIDER_SITE_OTHER)
Admission: RE | Admit: 2011-04-23 | Discharge: 2011-04-23 | Disposition: A | Discharge status: Home / Self Care | Payer: Medicare Other | Source: Ambulatory Visit | Attending: Physician Assistant | Admitting: Physician Assistant

## 2011-04-23 ENCOUNTER — Telehealth: Payer: Self-pay | Admitting: *Deleted

## 2011-04-23 ENCOUNTER — Encounter: Payer: Self-pay | Admitting: Physician Assistant

## 2011-04-23 DIAGNOSIS — K5732 Diverticulitis of large intestine without perforation or abscess without bleeding: Secondary | ICD-10-CM

## 2011-04-23 DIAGNOSIS — K5792 Diverticulitis of intestine, part unspecified, without perforation or abscess without bleeding: Secondary | ICD-10-CM

## 2011-04-23 DIAGNOSIS — R1032 Left lower quadrant pain: Secondary | ICD-10-CM

## 2011-04-23 DIAGNOSIS — K635 Polyp of colon: Secondary | ICD-10-CM | POA: Insufficient documentation

## 2011-04-23 DIAGNOSIS — D126 Benign neoplasm of colon, unspecified: Secondary | ICD-10-CM

## 2011-04-23 DIAGNOSIS — K5730 Diverticulosis of large intestine without perforation or abscess without bleeding: Secondary | ICD-10-CM

## 2011-04-23 DIAGNOSIS — K5790 Diverticulosis of intestine, part unspecified, without perforation or abscess without bleeding: Secondary | ICD-10-CM

## 2011-04-23 MED ORDER — IOHEXOL 300 MG/ML  SOLN
100.0000 mL | Freq: Once | INTRAMUSCULAR | Status: AC | PRN
  Administered 2011-04-23: 100 mL via INTRAVENOUS

## 2011-04-23 MED ORDER — METRONIDAZOLE 250 MG PO TABS: 250.0000 mg | ORAL_TABLET | Freq: Three times a day (TID) | ORAL | Status: DC

## 2011-04-23 MED ORDER — CIPROFLOXACIN HCL 500 MG PO TABS: ORAL_TABLET | ORAL | Status: DC

## 2011-04-23 NOTE — Patient Instructions (Signed)
We schedueld the CT Scan at [facility] [address] It is located in the [employer] building across from [facility] .  You have been scheduled for a CT scan of the abdomen and pelvis at [facility] ([address]---this is in the same building as Architectural technologist).   You are scheduled on 04-23-2011 at 12:30 PM.   Arrive at 12:15 PMYou should arrive 15 minutes prior to your appointment time for registration. Please follow the written instructions below on the day of your exam:  WARNING: IF YOU ARE ALLERGIC TO IODINE/X-RAY DYE, PLEASE NOTIFY RADIOLOGY IMMEDIATELY AT [phone]! YOU WILL BE GIVEN A 13 HOUR PREMEDICATION PREP.  1) Do not eat or drink anything until after the exam is over.2) You have been given 2 bottles of oral contrast to drink. The solution may taste better if refrigerated, but do NOT add ice or any other liquid to this solution. Shake  well before drinking.    Drink 1 bottle of contrast @ 10:30 AM (2 hours prior to your exam)  Drink 1 bottle of contrast @ 11:30 AM (1 hour prior to your exam)  You may take any medications as prescribed with a small amount of water except for the following: Metformin, Glucophage, Glucovance, Avandamet, Riomet, Fortamet, Actoplus Met, Janumet, Glumetza or Metaglip. The above medications must be held the day of the exam AND 48 hours after the exam.  The purpose of you drinking the oral contrast is to aid in the visualization of your intestinal tract. The contrast solution may cause some diarrhea. Before your exam is started, you will be given a small amount of fluid to drink. Depending on your individual set of symptoms, you may also receive an intravenous injection of x-ray contrast/dye. Plan on being at [facility] for 30 minutes or long, depending on the type of exam you are having performed.  If you have any questions regarding your exam or if you need to reschedule, you may call the CT department at  [phone] between the hours of 8:00 am and 5:00 pm, Monday-Friday.  ________________________________________________________________________

## 2011-04-23 NOTE — Progress Notes (Signed)
Subjective:    [name], female    DOB: 1925/05/01, 78 y.o.   MRN: [id number]  HPI Alexandria Davies is a pleasant 78 year old white female known to Alexandria Davies from prior colonoscopies. She had a colonoscopy in November of 2009 which was positive for descending colon diverticulosis and a couple of small polyps which were removed she also had internal hemorrhoids. Path on f her polyps showed benign polypoid tissue. Patient does have a family history of colon cancer in her son and also a strong family history of breast cancer as well as personal history of breast cancer.  Patient comes in today after being seen at Alexandria Davies office on Monday 2/18 with  left-sided abdominal pain onset about one week previous. She was felt to have diverticulitis and was started on a course of Cipro and Flagyl. She had labs done on the 18th including a CBC  And CMET  ,which were unremarkable.  After being on the Antibiotics  for 2 days she has developed a dark blackish diarrhea. It sounds as if she is having 3-4 loose bowel movements per day, and continues to have fairly constant left-sided abdominal pain which is not quite as bad as it had been. She has not had any fever or chills,but  in general she  does not feel well. She does complain of some nausea as well. She's quite concerned about the knot sensation that she has in her left abdomen and wonders if she has a hernia.    Review of Systems  Constitutional: Positive for appetite change.  HENT: Negative.   Eyes: Negative.   Respiratory: Negative.   Cardiovascular: Negative.   Gastrointestinal: Positive for nausea, abdominal pain and diarrhea.  Genitourinary: Negative.   Musculoskeletal: Negative.   Neurological: Negative.   Hematological: Negative.   Psychiatric/Behavioral: Negative.    Outpatient Prescriptions Prior to Visit  Medication Sig Dispense Refill  . ALPHA LIPOIC ACID PO Take 100 mg by mouth daily.        . Biotin 1000 MCG tablet Take 500  mcg by mouth daily.        . Calcium Carbonate-Vitamin D (CALCIUM 600+D HIGH POTENCY) 600-400 MG-UNIT per tablet Take 1 tablet by mouth daily.        . cholecalciferol (VITAMIN D) 1000 UNITS tablet Take 1,000 Units by mouth daily.       Marland Kitchen ibuprofen (ADVIL,MOTRIN) 200 MG tablet Take 200 mg by mouth every 6 (six) hours as needed. For pain       . levothyroxine (SYNTHROID, LEVOTHROID) 75 MCG tablet Take 75 mcg by mouth daily.        . Potassium Gluconate 595 MG CAPS Take 595 mg by mouth daily.        . simvastatin (ZOCOR) 20 MG tablet Take 20 mg by mouth daily.        Marland Kitchen trolamine salicylate (ASPERCREME) 10 % cream Apply 1 application topically 2 (two) times daily as needed. For pain       . vitamin C (ASCORBIC ACID) 500 MG tablet Take 500 mg by mouth daily.        . vitamin E (VITAMIN E) 400 UNIT capsule Take 400 Units by mouth daily.         Allergies  Allergen Reactions  . Epinephrine Other (See Comments)    Heart raced   . Etodolac Hives   Patient Active Problem List  Diagnoses  . HYPOTHYROIDISM  . HEMORRHOIDS, INTERNAL  . DEGENERATIVE JOINT DISEASE  .  PERSONAL HISTORY OF MALIGNANT NEOPLASM OF BREAST  . Diverticulosis  . Colon polyps       Objective:   Physical Exam in acute distress somewhat anxious blood pressure 1/64 pulse 60 weight is 148. HEENT; nontraumatic, normocephalic, EOMI, PERRLA sclera anicteric, neck; supple no JVD Cardiovascular; regular rate and rhythm with S1-S2 no murmur gallop, Pulmonary; clear bilaterally, Abdomen; soft she is tender in the left mid quadrant left lower quadrant with some guarding no rebound she also has a ping-pong ball-sized lipoma in the left mid quadrant which is nontender. Rectal; Brown stool Hemoccult negative, Extremities; no clubbing cyanosis or edema, Psych;mood and affect appropriate.        Assessment & Plan:  #27  78 year old female with known diverticulosis presenting with a one-week history of left-sided abdominal pain which has  been constant. She does have a lipoma in the left mid abdomen which explains the knot sensation she has in her abdomen. I do think she has acute diverticulitis, but she is hesitant to complete a course of antibiotics because of the diarrheal side effect. I do not think that she has C. Diff. as her diarrhea has been mild and started immediately after the antibiotics began.  Plan; proceed with CT scan of the abdomen and pelvis to confirm the diagnosis and better clarify need for a longer course of antibiotics. We may need to switch her to Augmentin she is not tolerating the Cipro and Flagyl well. Will await CT result, study to be done this afternoon.  #2 positive family history of colon cancer in the patient's son, patient had colonoscopy in November of 2009 and will be due for followup in 2014.

## 2011-04-23 NOTE — Telephone Encounter (Signed)
I called the patient to advise per Mike Gip PA-C that the CT scan showed infected diverticulum on the left side. Amy is suggesting the patient take a total of 10 days of Cipro and Flagyl.  She agreed to take the additional doses.  I also told her to take 1 Imodium capsule in the morning for several mornings.  She asked if she gets constipated should she still take the Imodium .  I told her to stop it at that point.  I told her I will call her on Tues or Wed next week to check on her progress.  I called the pharmacist at [facility] and ordered the additional doses.

## 2011-04-24 ENCOUNTER — Telehealth: Payer: Self-pay | Admitting: Physician Assistant

## 2011-04-24 NOTE — Telephone Encounter (Signed)
Spoke with patient and gave her the same report as Lowry Ram, CMA gave her yesterday as per Mike Gip, PA

## 2011-04-24 NOTE — Progress Notes (Signed)
I agree with assessment and plan.

## 2011-04-24 NOTE — Telephone Encounter (Signed)
Spoke with patient and she wants CT scan results. She states her diarrhea is getting a little better.

## 2011-04-28 ENCOUNTER — Telehealth: Payer: Self-pay | Admitting: Gastroenterology

## 2011-04-28 NOTE — Telephone Encounter (Signed)
Pt aware.

## 2011-04-28 NOTE — Telephone Encounter (Signed)
Pt was taking cipro and flagyl from her PCP. Amy Esterwood PA added an additional 3 days of treatment. Pt states that yesterday was the 8th day and she took the medicine and her face is red and puffy, she has also broken out in a rash. Pt states she does not want to take any more of the medication but wants to know if there is anything else she needs to do. Dr. Arlyce Dice please advise.

## 2011-04-28 NOTE — Telephone Encounter (Signed)
OK to d/c antibiotics

## 2011-05-06 ENCOUNTER — Ambulatory Visit (INDEPENDENT_AMBULATORY_CARE_PROVIDER_SITE_OTHER): Payer: Medicare Other | Admitting: Gastroenterology

## 2011-05-06 ENCOUNTER — Encounter: Payer: Self-pay | Admitting: Gastroenterology

## 2011-05-06 DIAGNOSIS — K635 Polyp of colon: Secondary | ICD-10-CM

## 2011-05-06 DIAGNOSIS — R1012 Left upper quadrant pain: Secondary | ICD-10-CM

## 2011-05-06 DIAGNOSIS — D126 Benign neoplasm of colon, unspecified: Secondary | ICD-10-CM

## 2011-05-06 DIAGNOSIS — Z800 Family history of malignant neoplasm of digestive organs: Secondary | ICD-10-CM

## 2011-05-06 HISTORY — DX: Family history of malignant neoplasm of digestive organs: Z80.0

## 2011-05-06 NOTE — Assessment & Plan Note (Signed)
Symptoms were due to epiploic appendigitis which has resolved with antibiotic therapy. She has residual complaints of bloating and excess gas that likely is related to altered bowel flora.  Recommendations #1 align for 10 days

## 2011-05-06 NOTE — Progress Notes (Signed)
History of Present Illness:  Mrs. Chabot has returned for followup of pain and diarrhea. CT scan demonstrated changes consistent with acute epiploic appendigitis.  Pain has entirely subsided. She had diarrhea with the antibiotics and cut short her antibiotic course by 2 days. At this time she's complaining of abdominal bloating and excess gas. She's without pain or diarrhea.    Review of Systems: Pertinent positive and negative review of systems were noted in the above HPI section. All other review of systems were otherwise negative.    Current Medications, Allergies, Past Medical History, Past Surgical History, Family History and Social History were reviewed in [facility] electronic medical record  Vital signs were reviewed in today's medical record. Physical Exam: General: Well developed , well nourished, no acute distress

## 2011-05-06 NOTE — Patient Instructions (Signed)
Follow up as needed Use Align daily for 10 days we have given you samples

## 2011-05-06 NOTE — Assessment & Plan Note (Signed)
Plan follow-up colonoscopy 2016 

## 2011-05-11 ENCOUNTER — Ambulatory Visit
Admission: RE | Admit: 2011-05-11 | Discharge: 2011-05-11 | Disposition: A | Discharge status: Home / Self Care | Payer: Medicare Other | Source: Ambulatory Visit | Attending: Internal Medicine | Admitting: Internal Medicine

## 2011-05-11 DIAGNOSIS — Z1231 Encounter for screening mammogram for malignant neoplasm of breast: Secondary | ICD-10-CM

## 2011-07-01 ENCOUNTER — Telehealth: Payer: Self-pay

## 2011-07-01 NOTE — Telephone Encounter (Signed)
ATC PT NA unable to LVM WCB--pt is not pt here

## 2011-07-02 NOTE — Telephone Encounter (Signed)
Patient has never been seen by this practice, she is currently a patient of Dr Arlyce Dice.  Called spoke with patient, who stated that she was on allergy shots on the 80s and over the past few weeks/month she has been having trouble with her allergies.  Pt is requesting to have allergy testing done.  Advised pt that consult is needed before allergy testing can be done.  Pt okay with this.  Consult with CDY scheduled for 6.13.13 @ 2:45pm - pt aware to arrive 15 minutes early for paperwork and bring current insurance cards.  Nothing further needed, will sign off.

## 2011-07-02 NOTE — Telephone Encounter (Signed)
LMTCB

## 2011-08-13 ENCOUNTER — Other Ambulatory Visit (INDEPENDENT_AMBULATORY_CARE_PROVIDER_SITE_OTHER): Payer: Medicare Other

## 2011-08-13 ENCOUNTER — Ambulatory Visit (INDEPENDENT_AMBULATORY_CARE_PROVIDER_SITE_OTHER): Payer: Medicare Other | Admitting: Internal Medicine

## 2011-08-13 ENCOUNTER — Encounter: Payer: Self-pay | Admitting: Internal Medicine

## 2011-08-13 VITALS — BP 122/66 | HR 66 | Ht 65.5 in | Wt 149.0 lb

## 2011-08-13 DIAGNOSIS — J029 Acute pharyngitis, unspecified: Secondary | ICD-10-CM

## 2011-08-13 DIAGNOSIS — J309 Allergic rhinitis, unspecified: Secondary | ICD-10-CM

## 2011-08-13 LAB — CBC WITH DIFFERENTIAL/PLATELET
Basophils Absolute: 0 10*3/uL (ref 0.0–0.1)
Eosinophils Absolute: 0.2 10*3/uL (ref 0.0–0.7)
Lymphocytes Relative: 27.1 % (ref 12.0–46.0)
MCHC: 33.5 g/dL (ref 30.0–36.0)
Neutrophils Relative %: 61.7 % (ref 43.0–77.0)
RBC: 4.51 Mil/uL (ref 3.87–5.11)
RDW: 13.9 % (ref 11.5–14.6)

## 2011-08-13 NOTE — Progress Notes (Signed)
08/13/11- 78 yoF never smoker referred courtesy of Dr. Haroldine Laws for evaluation of suspected allergy. She was on allergy vaccine 20 years ago and remembers doing very well. She has had sore throats, enlarged nodule on the left side of her neck is shifted some, throat clearing. This is gone at least 2 months. Dr. Haroldine Laws has treated her for otitis in the past with antibiotics for the recent symptoms. She has been putting Vick's on her neck. She denies discomfort with her eyes were ears now. Her chest is been clear without cough or wheeze. She denies any GERD or reflux. There is poorly defined family history of allergies and of cancer.  Prior to Admission medications   Medication Sig Start Date End Date Taking? Authorizing Provider  ALPHA LIPOIC ACID PO Take 100 mg by mouth daily.     Yes Historical Provider, MD  Biotin 1000 MCG tablet Take 500 mcg by mouth daily.     Yes Historical Provider, MD  Calcium Carbonate-Vitamin D (CALCIUM 600+D HIGH POTENCY) 600-400 MG-UNIT per tablet Take 1 tablet by mouth daily.     Yes Historical Provider, MD  cholecalciferol (VITAMIN D) 1000 UNITS tablet Take 1,000 Units by mouth daily.    Yes Historical Provider, MD  ibuprofen (ADVIL,MOTRIN) 200 MG tablet Take 200 mg by mouth every 6 (six) hours as needed. For pain    Yes Historical Provider, MD  levothyroxine (SYNTHROID, LEVOTHROID) 75 MCG tablet Take 75 mcg by mouth daily.     Yes Historical Provider, MD  Potassium Gluconate 595 MG CAPS Take 595 mg by mouth daily.     Yes Historical Provider, MD  simvastatin (ZOCOR) 20 MG tablet Take 20 mg by mouth daily.     Yes Historical Provider, MD  trolamine salicylate (ASPERCREME) 10 % cream Apply 1 application topically 2 (two) times daily as needed. For pain    Yes Historical Provider, MD  vitamin C (ASCORBIC ACID) 500 MG tablet Take 500 mg by mouth daily.     Yes Historical Provider, MD  vitamin E (VITAMIN E) 400 UNIT capsule Take 400 Units by mouth daily.     Yes Historical  Provider, MD   Past Medical History  Diagnosis Date  . Breast cancer   . Hypercholesteremia   . Hypothyroidism   . Internal hemorrhoids without mention of complication   . Family history of malignant neoplasm of gastrointestinal tract 05/06/2011   Past Surgical History  Procedure Date  . Abdominal hysterectomy   . Bladder tac   . Bunionectomy     bilateral  . Breast lumpectomy     left  . Right shoulder     bone spur removed  . Wrist fracture surgery     left   Family History  Problem Relation Age of Onset  . Breast cancer Sister     x 2  . Colon cancer Son   . Heart disease Mother     died at age 63   History   Social History  . Marital Status: Married    Spouse Name: N/A    Number of Children: N/A  . Years of Education: N/A   Occupational History  . Retired    Social History Main Topics  . Smoking status: Never Smoker   . Smokeless tobacco: Never Used  . Alcohol Use: Yes     ocassionally  . Drug Use: No  . Sexually Active: Not on file   Other Topics Concern  . Not on file  Social History Narrative  . No narrative on file   ROS-see HPI Constitutional:   No-   weight loss, night sweats, fevers, chills, fatigue, lassitude. HEENT:   No-  headaches, difficulty swallowing, tooth/dental problems, +sore throat,       No-  sneezing, itching, ear ache, nasal congestion, post nasal drip,  CV:  No-   chest pain, orthopnea, PND, swelling in lower extremities, anasarca, dizziness, palpitations Resp: No-   shortness of breath with exertion or at rest.              No-   productive cough,  No non-productive cough,  No- coughing up of blood.              No-   change in color of mucus.  No- wheezing.   Skin: No-   rash or lesions. GI:  No-   heartburn, indigestion, abdominal pain, nausea, vomiting, diarrhea,                 change in bowel habits, loss of appetite GU: No-   dysuria, change in color of urine, no urgency or frequency.  No- flank pain. MS:  No-    joint pain or swelling.  No- decreased range of motion.  No- back pain. Neuro-     nothing unusual Psych:  No- change in mood or affect. No depression or anxiety.  No memory loss.  OBJ- Physical Exam General- Alert, Oriented, Affect-appropriate, Distress- none acute, looks well for age Skin- rash-none, lesions- none, excoriation- none Lymphadenopathy- none. Specifically no enlarged cervical nodes found. Head- atraumatic            Eyes- Gross vision intact, PERRLA, conjunctivae and secretions clear            Ears- +Hearing diminished, scar R TM            Nose- Clear, no-Septal dev, mucus, polyps, erosion, perforation             Throat- Mallampati II , +mucosa clear- minimally red , drainage- none, tonsils- atrophic Neck- flexible , trachea midline, no stridor , thyroid nl, carotid no bruit Chest - symmetrical excursion , unlabored           Heart/CV- RRR , no murmur , no gallop  , no rub, nl s1 s2                           - JVD- none , edema- none, stasis changes- none, varices- none           Lung- clear to P&A, wheeze- none, cough- none , dullness-none, rub- none           Chest wall-  Abd- tender-no, distended-no, bowel sounds-present, HSM- no Br/ Gen/ Rectal- Not done, not indicated Extrem- cyanosis- none, clubbing, none, atrophy- none, strength- nl Neuro- grossly intact to observation

## 2011-08-13 NOTE — Patient Instructions (Addendum)
Order- CBC w/ diff, Allergy Profile     Dx pharyngitis  Sample Dymista nasal spray    1-2 puffs each nostril every night at bedtime

## 2011-08-14 LAB — ALLERGY FULL PROFILE
Allergen, D pternoyssinus,d7: 1.47 kU/L — ABNORMAL HIGH (ref <0.35)
Alternaria Alternata: 0.13 kU/L (ref <0.35)
Aspergillus fumigatus, IgG: 0.17 kU/L (ref <0.35)
Bahia Grass: 8.59 kU/L — ABNORMAL HIGH (ref <0.35)
Cat Dander: <0.1 kU/L (ref <0.35)
Curvularia lunata: <0.1 kU/L (ref <0.35)
D. farinae: 0.76 kU/L — ABNORMAL HIGH (ref <0.35)
Elm IgE: 6.54 kU/L — ABNORMAL HIGH (ref <0.35)
G009 Red Top: 4.77 kU/L — ABNORMAL HIGH (ref <0.35)
House Dust Hollister: 0.22 kU/L (ref <0.35)
Lamb's Quarters: 7.5 kU/L — ABNORMAL HIGH (ref <0.35)
Plantain: 6.03 kU/L — ABNORMAL HIGH (ref <0.35)
Sycamore Tree: 4.51 kU/L — ABNORMAL HIGH (ref <0.35)

## 2011-08-19 DIAGNOSIS — J309 Allergic rhinitis, unspecified: Secondary | ICD-10-CM | POA: Insufficient documentation

## 2011-08-19 NOTE — Assessment & Plan Note (Signed)
Or throat complaints probably related to the same process that irritates her nasopharynx. This may respond to treatment of postnasal drip with the Dymista. I do not suspect strep throat. I can never completely exclude an element of reflux with pharyngeal irritation.

## 2011-08-19 NOTE — Assessment & Plan Note (Signed)
Allergic versus nonallergic rhinitis, complaints of drainage which presents for consideration of allergy. Plan-lab allergy profile, sample of Dymista nasal spray

## 2011-08-20 NOTE — Progress Notes (Signed)
Quick Note:  LMTCB ______ 

## 2011-08-21 ENCOUNTER — Telehealth: Payer: Self-pay | Admitting: Internal Medicine

## 2011-08-21 NOTE — Telephone Encounter (Signed)
Notes Recorded by Waymon Budge, MD on 08/14/2011 at 5:26 PM Blood count is normal. There are significantly elevated allergy antibody levels to a number of common environmental allergens.  I spoke with patient about results and she verbalized understanding and had no questions

## 2011-09-08 ENCOUNTER — Ambulatory Visit (INDEPENDENT_AMBULATORY_CARE_PROVIDER_SITE_OTHER): Payer: Medicare Other | Admitting: Internal Medicine

## 2011-09-08 ENCOUNTER — Encounter: Payer: Self-pay | Admitting: Internal Medicine

## 2011-09-08 VITALS — BP 110/64 | HR 58 | Ht 65.5 in | Wt 147.8 lb

## 2011-09-08 DIAGNOSIS — J309 Allergic rhinitis, unspecified: Secondary | ICD-10-CM

## 2011-09-08 NOTE — Progress Notes (Signed)
08/13/11- 42 yoF never smoker referred courtesy of Dr. Haroldine Laws for evaluation of suspected allergy. She was on allergy vaccine 20 years ago and remembers doing very well. She has had sore throats, enlarged nodule on the left side of her neck is shifted some, throat clearing. This is gone at least 2 months. Dr. Haroldine Laws has treated her for otitis in the past with antibiotics for the recent symptoms. She has been putting Vick's on her neck. She denies discomfort with her eyes were ears now. Her chest is been clear without cough or wheeze. She denies any GERD or reflux. There is poorly defined family history of allergies and of cancer.   09/08/11- 86 yoF never smoker followed for allergy evaluation Noticed glands swelling in neck few days ago "due to allergies"; Dymista  helped slightly. She now feels better and adenopathy gone.  Allergy Profile 08/13/11- total IgE 279.3, specific elevations for dust mite, grass pollens, tree and weed pollens.  ROS-see HPI Constitutional:   No-   weight loss, night sweats, fevers, chills, fatigue, lassitude. HEENT:   No-  headaches, difficulty swallowing, tooth/dental problems, +sore throat,       No-  sneezing, itching, ear ache, nasal congestion, post nasal drip,  CV:  No-   chest pain, orthopnea, PND, swelling in lower extremities, anasarca, dizziness, palpitations Resp: No-   shortness of breath with exertion or at rest.              No-   productive cough,  No non-productive cough,  No- coughing up of blood.              No-   change in color of mucus.  No- wheezing.   Skin: No-   rash or lesions. GI:  No-   heartburn, indigestion, abdominal pain, nausea, vomiting,  GU:  MS:  No-   joint pain or swelling.  Neuro-     nothing unusual Psych:  No- change in mood or affect. No depression or anxiety.  No memory loss.  OBJ- Physical Exam General- Alert, Oriented, Affect-appropriate, Distress- none acute, looks well for age Skin- rash-none, lesions- none,  excoriation- none Lymphadenopathy- none. Specifically no enlarged cervical nodes found. Head- atraumatic            Eyes- Gross vision intact, PERRLA, conjunctivae and secretions clear            Ears- +Hearing diminished, scar R TM            Nose- Clear, no-Septal dev, mucus, polyps, erosion, perforation             Throat- Mallampati II , mucosa clear , drainage- none, tonsils- atrophic Neck- flexible , trachea midline, no stridor , thyroid nl, carotid no bruit Chest - symmetrical excursion , unlabored           Heart/CV- RRR , no murmur , no gallop  , no rub, nl s1 s2                           - JVD- none , edema- none, stasis changes- none, varices- none           Lung- clear to P&A, wheeze- none, cough- none , dullness-none, rub- none           Chest wall-  Abd-  Br/ Gen/ Rectal- Not done, not indicated Extrem- cyanosis- none, clubbing, none, atrophy- none, strength- nl Neuro- grossly intact to observation

## 2011-09-08 NOTE — Patient Instructions (Addendum)
Sample dymista nasal spray-   To try again when needed   1-2 puffs each nostril once daily at bedtime  Consider the environmental dust and pollen control information I gave you.  We will get you back in the Fall to see how you are doing then. Please call earlier as needed.

## 2011-09-14 NOTE — Assessment & Plan Note (Signed)
Significantly atopic, sensitive to common environmental allergens. Plan-environmental dust precautions reviewed. Sample of Dymista to hold for now. She would be a candidate for allergy vaccine therapy if symptoms require, but for now she is satisfied.

## 2011-12-01 ENCOUNTER — Ambulatory Visit: Payer: Medicare Other | Admitting: Internal Medicine

## 2012-02-05 ENCOUNTER — Telehealth: Payer: Self-pay | Admitting: Internal Medicine

## 2012-02-05 MED ORDER — AZELASTINE-FLUTICASONE 137-50 MCG/ACT NA SUSP: 1.0000 | Freq: Every day | NASAL | Status: DC

## 2012-02-05 NOTE — Telephone Encounter (Signed)
Pt informed that rx for Dymista was sent to [facility].

## 2012-04-04 ENCOUNTER — Other Ambulatory Visit: Payer: Self-pay | Admitting: Internal Medicine

## 2012-04-04 DIAGNOSIS — Z1231 Encounter for screening mammogram for malignant neoplasm of breast: Secondary | ICD-10-CM

## 2012-05-11 ENCOUNTER — Ambulatory Visit
Admission: RE | Admit: 2012-05-11 | Discharge: 2012-05-11 | Disposition: A | Discharge status: Home / Self Care | Payer: Medicare Other | Source: Ambulatory Visit | Attending: Internal Medicine | Admitting: Internal Medicine

## 2012-05-12 ENCOUNTER — Other Ambulatory Visit: Payer: Self-pay | Admitting: Internal Medicine

## 2012-05-12 DIAGNOSIS — R928 Other abnormal and inconclusive findings on diagnostic imaging of breast: Secondary | ICD-10-CM

## 2012-05-26 ENCOUNTER — Ambulatory Visit
Admission: RE | Admit: 2012-05-26 | Discharge: 2012-05-26 | Disposition: A | Discharge status: Home / Self Care | Payer: Medicare Other | Source: Ambulatory Visit | Attending: Internal Medicine | Admitting: Internal Medicine

## 2012-05-26 DIAGNOSIS — R928 Other abnormal and inconclusive findings on diagnostic imaging of breast: Secondary | ICD-10-CM

## 2013-01-04 ENCOUNTER — Other Ambulatory Visit: Payer: Self-pay | Admitting: Orthopaedic Surgery

## 2013-01-12 NOTE — H&P (Signed)
Alexandria Davies is an 79 y.o. female.   Chief Complaint: Left knee pain HPI: Re: Continues to complain of left knee pain with a catching sensation.  This is limiting her activities.  She feels that something is moving around inside of her knee joint.  She continues on meloxicam.  She has had a recent injection which helped temporarily.  Pain is intermittent and severe.X-rays taken recently show mild patellofemoral degeneration.  We have discussed proceeding with a arthroscopy to hopefully eliminate her pain and improve her function and activities of daily living.  Past Medical History  Diagnosis Date  . Breast cancer   . Hypercholesteremia   . Hypothyroidism   . Internal hemorrhoids without mention of complication   . Family history of malignant neoplasm of gastrointestinal tract 05/06/2011    Past Surgical History  Procedure Laterality Date  . Abdominal hysterectomy    . Bladder tac    . Bunionectomy      bilateral  . Breast lumpectomy      left  . Right shoulder      bone spur removed  . Wrist fracture surgery      left    Family History  Problem Relation Age of Onset  . Breast cancer Sister     x 2  . Colon cancer Son   . Heart disease Mother     died at age 19   Social History:  reports that she has never smoked. She has never used smokeless tobacco. She reports that she drinks alcohol. She reports that she does not use illicit drugs.  Allergies:  Allergies  Allergen Reactions  . Ciprofloxacin   . Epinephrine Other (See Comments)    Heart raced   . Etodolac Hives  . Flagyl [Metronidazole Hcl]     No prescriptions prior to admission    No results found for this or any previous visit (from the past 48 hour(s)). No results found.  Review of Systems  Constitutional: Negative.   HENT: Negative.   Eyes: Negative.   Respiratory: Negative.   Cardiovascular: Negative.   Gastrointestinal: Negative.   Genitourinary: Negative.   Musculoskeletal: Positive for joint  pain.  Skin: Negative.   Neurological: Negative.   Endo/Heme/Allergies: Negative.   Psychiatric/Behavioral: Negative.     There were no vitals taken for this visit. Physical Exam  Constitutional: She appears well-nourished.  HENT:  Head: Atraumatic.  Eyes: Conjunctivae are normal.  Neck: Neck supple.  Cardiovascular: Normal rate.   Respiratory: Effort normal.  GI: Soft. Bowel sounds are normal.  Musculoskeletal:  Left knee exam: Trace effusion.  Range of motion 0-1 20.  Pain with hyperflexion and pain along the medial joint line to palpation.  McMurray's testing causes medial pain.  Ligaments stable.  Neurological: She is alert.  Skin: Skin is warm.  Psychiatric: She has a normal mood and affect.     Assessment/Plan Assessment: Left knee chondromalacia in probable torn lateral meniscus, most recently injected on 11/28/12. Plan: We have talked to Alexandria Davies about proceeding with a left knee arthroscopy.  She has failed anti-inflammatory medicines and injections and has continued symptoms.  We have discussed the risks of anesthesia, infection and potential DVT associated with knee arthroscopy.  Also discussed the need for postoperative physical therapy to optimize the results.  Katrece Roediger R 01/12/2013, 5:28 PM

## 2013-01-13 ENCOUNTER — Encounter (HOSPITAL_BASED_OUTPATIENT_CLINIC_OR_DEPARTMENT_OTHER): Payer: Self-pay | Admitting: *Deleted

## 2013-01-13 NOTE — Progress Notes (Signed)
Very sharp little lady-no labs needed no cardiac problems

## 2013-01-17 ENCOUNTER — Ambulatory Visit (HOSPITAL_BASED_OUTPATIENT_CLINIC_OR_DEPARTMENT_OTHER)
Admission: RE | Admit: 2013-01-17 | Discharge: 2013-01-17 | Disposition: A | Discharge status: Home / Self Care | Payer: Medicare Other | Source: Ambulatory Visit | Attending: Orthopaedic Surgery | Admitting: Orthopaedic Surgery

## 2013-01-17 ENCOUNTER — Encounter (HOSPITAL_BASED_OUTPATIENT_CLINIC_OR_DEPARTMENT_OTHER)
Admission: RE | Disposition: A | Discharge status: Home / Self Care | Payer: Self-pay | Source: Ambulatory Visit | Attending: Orthopaedic Surgery

## 2013-01-17 ENCOUNTER — Ambulatory Visit (HOSPITAL_BASED_OUTPATIENT_CLINIC_OR_DEPARTMENT_OTHER): Payer: Medicare Other | Admitting: *Deleted

## 2013-01-17 ENCOUNTER — Encounter (HOSPITAL_BASED_OUTPATIENT_CLINIC_OR_DEPARTMENT_OTHER): Payer: Self-pay | Admitting: *Deleted

## 2013-01-17 ENCOUNTER — Encounter (HOSPITAL_BASED_OUTPATIENT_CLINIC_OR_DEPARTMENT_OTHER): Payer: Medicare Other | Admitting: *Deleted

## 2013-01-17 DIAGNOSIS — E039 Hypothyroidism, unspecified: Secondary | ICD-10-CM | POA: Insufficient documentation

## 2013-01-17 DIAGNOSIS — M23329 Other meniscus derangements, posterior horn of medial meniscus, unspecified knee: Secondary | ICD-10-CM | POA: Insufficient documentation

## 2013-01-17 DIAGNOSIS — M1710 Unilateral primary osteoarthritis, unspecified knee: Secondary | ICD-10-CM | POA: Insufficient documentation

## 2013-01-17 DIAGNOSIS — Z853 Personal history of malignant neoplasm of breast: Secondary | ICD-10-CM | POA: Insufficient documentation

## 2013-01-17 DIAGNOSIS — Z9889 Other specified postprocedural states: Secondary | ICD-10-CM

## 2013-01-17 DIAGNOSIS — M2340 Loose body in knee, unspecified knee: Secondary | ICD-10-CM | POA: Insufficient documentation

## 2013-01-17 DIAGNOSIS — M23302 Other meniscus derangements, unspecified lateral meniscus, unspecified knee: Secondary | ICD-10-CM | POA: Insufficient documentation

## 2013-01-17 HISTORY — PX: KNEE ARTHROSCOPY: SHX127

## 2013-01-17 HISTORY — DX: Presence of spectacles and contact lenses: Z97.3

## 2013-01-17 HISTORY — DX: Presence of dental prosthetic device (complete) (partial): Z97.2

## 2013-01-17 SURGERY — ARTHROSCOPY, KNEE
Anesthesia: General | Site: Knee | Laterality: Left | Wound class: Clean

## 2013-01-17 MED ORDER — MIDAZOLAM HCL 2 MG/2ML IJ SOLN
INTRAMUSCULAR | Status: AC
  Filled 2013-01-17: qty 2

## 2013-01-17 MED ORDER — HYDROCODONE-ACETAMINOPHEN 5-325 MG PO TABS
ORAL_TABLET | ORAL | Status: AC
  Filled 2013-01-17: qty 1

## 2013-01-17 MED ORDER — SODIUM CHLORIDE 0.9 % IR SOLN
Status: DC | PRN
  Administered 2013-01-17: 6000 mL

## 2013-01-17 MED ORDER — CHLORHEXIDINE GLUCONATE 4 % EX LIQD: 60.0000 mL | Freq: Once | CUTANEOUS | Status: DC

## 2013-01-17 MED ORDER — METHYLPREDNISOLONE ACETATE 80 MG/ML IJ SUSP
INTRAMUSCULAR | Status: DC | PRN
  Administered 2013-01-17: 80 mg

## 2013-01-17 MED ORDER — PROPOFOL 10 MG/ML IV EMUL
INTRAVENOUS | Status: AC
  Filled 2013-01-17: qty 100

## 2013-01-17 MED ORDER — ONDANSETRON HCL 4 MG/2ML IJ SOLN: 4.0000 mg | Freq: Once | INTRAMUSCULAR | Status: DC | PRN

## 2013-01-17 MED ORDER — FENTANYL CITRATE 0.05 MG/ML IJ SOLN: 50.0000 ug | INTRAMUSCULAR | Status: DC | PRN

## 2013-01-17 MED ORDER — MORPHINE SULFATE 4 MG/ML IJ SOLN
INTRAMUSCULAR | Status: AC
  Filled 2013-01-17: qty 1

## 2013-01-17 MED ORDER — MORPHINE SULFATE 4 MG/ML IJ SOLN
INTRAMUSCULAR | Status: DC | PRN
  Administered 2013-01-17: 4 mg

## 2013-01-17 MED ORDER — BUPIVACAINE HCL (PF) 0.5 % IJ SOLN
INTRAMUSCULAR | Status: AC
  Filled 2013-01-17: qty 30

## 2013-01-17 MED ORDER — MIDAZOLAM HCL 2 MG/2ML IJ SOLN: 1.0000 mg | INTRAMUSCULAR | Status: DC | PRN

## 2013-01-17 MED ORDER — KETOROLAC TROMETHAMINE 30 MG/ML IJ SOLN: 15.0000 mg | Freq: Once | INTRAMUSCULAR | Status: DC | PRN

## 2013-01-17 MED ORDER — LACTATED RINGERS IV SOLN
INTRAVENOUS | Status: DC
  Administered 2013-01-17: 08:00:00 via INTRAVENOUS

## 2013-01-17 MED ORDER — FENTANYL CITRATE 0.05 MG/ML IJ SOLN
INTRAMUSCULAR | Status: AC
  Filled 2013-01-17: qty 4

## 2013-01-17 MED ORDER — ONDANSETRON HCL 4 MG/2ML IJ SOLN
INTRAMUSCULAR | Status: DC | PRN
  Administered 2013-01-17: 4 mg via INTRAVENOUS

## 2013-01-17 MED ORDER — HYDROCODONE-ACETAMINOPHEN 5-325 MG PO TABS: 1.0000 | ORAL_TABLET | Freq: Four times a day (QID) | ORAL | Status: DC | PRN

## 2013-01-17 MED ORDER — HYDROMORPHONE HCL PF 1 MG/ML IJ SOLN
INTRAMUSCULAR | Status: AC
  Filled 2013-01-17: qty 1

## 2013-01-17 MED ORDER — HYDROMORPHONE HCL PF 1 MG/ML IJ SOLN
0.2500 mg | INTRAMUSCULAR | Status: DC | PRN
  Administered 2013-01-17 (×2): 0.5 mg via INTRAVENOUS

## 2013-01-17 MED ORDER — BUPIVACAINE HCL (PF) 0.5 % IJ SOLN
INTRAMUSCULAR | Status: DC | PRN
  Administered 2013-01-17: 20 mL via INTRA_ARTICULAR

## 2013-01-17 MED ORDER — PROPOFOL 10 MG/ML IV BOLUS
INTRAVENOUS | Status: DC | PRN
  Administered 2013-01-17: 140 mg via INTRAVENOUS
  Administered 2013-01-17: 20 mg via INTRAVENOUS

## 2013-01-17 MED ORDER — DEXTROSE IN LACTATED RINGERS 5 % IV SOLN: INTRAVENOUS | Status: DC

## 2013-01-17 MED ORDER — CEFAZOLIN SODIUM 1-5 GM-% IV SOLN
INTRAVENOUS | Status: AC
  Filled 2013-01-17: qty 100

## 2013-01-17 MED ORDER — LIDOCAINE HCL (CARDIAC) 20 MG/ML IV SOLN
INTRAVENOUS | Status: DC | PRN
  Administered 2013-01-17: 20 mg via INTRAVENOUS

## 2013-01-17 MED ORDER — DEXAMETHASONE SODIUM PHOSPHATE 4 MG/ML IJ SOLN
INTRAMUSCULAR | Status: DC | PRN
  Administered 2013-01-17: 4 mg via INTRAVENOUS

## 2013-01-17 MED ORDER — FENTANYL CITRATE 0.05 MG/ML IJ SOLN
INTRAMUSCULAR | Status: DC | PRN
  Administered 2013-01-17: 50 ug via INTRAVENOUS
  Administered 2013-01-17: 100 ug via INTRAVENOUS

## 2013-01-17 MED ORDER — HYDROCODONE-ACETAMINOPHEN 5-325 MG PO TABS
1.0000 | ORAL_TABLET | Freq: Once | ORAL | Status: AC
  Administered 2013-01-17: 1 via ORAL

## 2013-01-17 MED ORDER — CEFAZOLIN SODIUM-DEXTROSE 2-3 GM-% IV SOLR
2.0000 g | INTRAVENOUS | Status: AC
  Administered 2013-01-17: 2 g via INTRAVENOUS

## 2013-01-17 SURGICAL SUPPLY — 41 items
BANDAGE ELASTIC 6 VELCRO ST LF (GAUZE/BANDAGES/DRESSINGS) ×2 IMPLANT
BANDAGE GAUZE ELAST BULKY 4 IN (GAUZE/BANDAGES/DRESSINGS) ×2 IMPLANT
BLADE CUDA 5.5 (BLADE) IMPLANT
BLADE GREAT WHITE 4.2 (BLADE) ×2 IMPLANT
CANISTER SUCT 3000ML (MISCELLANEOUS) IMPLANT
CANISTER SUCT LVC 12 LTR MEDI- (MISCELLANEOUS) IMPLANT
DRAPE ARTHROSCOPY W/POUCH 114 (DRAPES) ×2 IMPLANT
DRAPE U-SHAPE 47X51 STRL (DRAPES) ×2 IMPLANT
DRSG EMULSION OIL 3X3 NADH (GAUZE/BANDAGES/DRESSINGS) ×2 IMPLANT
DURAPREP 26ML APPLICATOR (WOUND CARE) ×2 IMPLANT
ELECT MENISCUS 165MM 90D (ELECTRODE) IMPLANT
ELECT REM PT RETURN 9FT ADLT (ELECTROSURGICAL)
ELECTRODE REM PT RTRN 9FT ADLT (ELECTROSURGICAL) IMPLANT
GLOVE BIO SURGEON STRL SZ 6.5 (GLOVE) ×2 IMPLANT
GLOVE BIO SURGEON STRL SZ8.5 (GLOVE) IMPLANT
GLOVE BIOGEL PI IND STRL 7.0 (GLOVE) ×1 IMPLANT
GLOVE BIOGEL PI IND STRL 8 (GLOVE) ×2 IMPLANT
GLOVE BIOGEL PI IND STRL 8.5 (GLOVE) IMPLANT
GLOVE BIOGEL PI INDICATOR 7.0 (GLOVE) ×1
GLOVE BIOGEL PI INDICATOR 8 (GLOVE) ×2
GLOVE BIOGEL PI INDICATOR 8.5 (GLOVE)
GLOVE SS BIOGEL STRL SZ 8 (GLOVE) ×1 IMPLANT
GLOVE SUPERSENSE BIOGEL SZ 8 (GLOVE) ×1
GOWN BRE IMP PREV XXLGXLNG (GOWN DISPOSABLE) ×2 IMPLANT
GOWN PREVENTION PLUS XLARGE (GOWN DISPOSABLE) ×2 IMPLANT
GOWN PREVENTION PLUS XXLARGE (GOWN DISPOSABLE) ×2 IMPLANT
IV NS IRRIG 3000ML ARTHROMATIC (IV SOLUTION) ×4 IMPLANT
KNEE WRAP E Z 3 GEL PACK (MISCELLANEOUS) ×2 IMPLANT
PACK ARTHROSCOPY DSU (CUSTOM PROCEDURE TRAY) ×2 IMPLANT
PACK BASIN DAY SURGERY FS (CUSTOM PROCEDURE TRAY) ×2 IMPLANT
PENCIL BUTTON HOLSTER BLD 10FT (ELECTRODE) IMPLANT
SET ARTHROSCOPY TUBING (MISCELLANEOUS) ×1
SET ARTHROSCOPY TUBING LN (MISCELLANEOUS) ×1 IMPLANT
SHEET MEDIUM DRAPE 40X70 STRL (DRAPES) ×2 IMPLANT
SPONGE GAUZE 4X4 12PLY (GAUZE/BANDAGES/DRESSINGS) ×2 IMPLANT
SYR 3ML 18GX1 1/2 (SYRINGE) IMPLANT
TOWEL OR 17X24 6PK STRL BLUE (TOWEL DISPOSABLE) ×2 IMPLANT
TOWEL OR NON WOVEN STRL DISP B (DISPOSABLE) ×2 IMPLANT
WAND 30 DEG SABER W/CORD (SURGICAL WAND) IMPLANT
WAND STAR VAC 90 (SURGICAL WAND) IMPLANT
WATER STERILE IRR 1000ML POUR (IV SOLUTION) ×2 IMPLANT

## 2013-01-17 NOTE — Anesthesia Procedure Notes (Signed)
Procedure Name: LMA Insertion Date/Time: 01/17/2013 8:32 AM Performed by: Velna Ochs Pre-anesthesia Checklist: Patient identified, Emergency Drugs available, Suction available and Patient being monitored Patient Re-evaluated:Patient Re-evaluated prior to inductionOxygen Delivery Method: Circle System Utilized Preoxygenation: Pre-oxygenation with 100% oxygen Intubation Type: IV induction Ventilation: Mask ventilation without difficulty LMA: LMA inserted LMA Size: 4.0 Number of attempts: 1 Airway Equipment and Method: bite block Placement Confirmation: positive ETCO2 and breath sounds checked- equal and bilateral Tube secured with: Tape Dental Injury: Teeth and Oropharynx as per pre-operative assessment

## 2013-01-17 NOTE — Op Note (Signed)
[id number] 

## 2013-01-17 NOTE — Transfer of Care (Signed)
Immediate Anesthesia Transfer of Care Note  Patient: Alexandria Davies  Procedure(s) Performed: Procedure(s) with comments: ARTHROSCOPY LEFT KNEE (Left) - partial medial and partial lateral chondroplasty and removal loose body  Patient Location: PACU  Anesthesia Type:General  Level of Consciousness: awake, alert  and oriented  Airway & Oxygen Therapy: Patient Spontanous Breathing and Patient connected to face mask oxygen  Post-op Assessment: Report given to PACU RN, Post -op Vital signs reviewed and stable and Patient moving all extremities  Post vital signs: Reviewed and stable  Complications: No apparent anesthesia complications

## 2013-01-17 NOTE — Anesthesia Postprocedure Evaluation (Signed)
  Anesthesia Post-op Note  Patient: Alexandria Davies  Procedure(s) Performed: Procedure(s) with comments: ARTHROSCOPY LEFT KNEE (Left) - partial medial and partial lateral chondroplasty and removal loose body  Patient Location: PACU  Anesthesia Type:General  Level of Consciousness: awake, alert  and oriented  Airway and Oxygen Therapy: Patient Spontanous Breathing and Patient connected to nasal cannula oxygen  Post-op Pain: mild  Post-op Assessment: Post-op Vital signs reviewed, Patient's Cardiovascular Status Stable, Respiratory Function Stable, Patent Airway and Pain level controlled  Post-op Vital Signs: stable  Complications: No apparent anesthesia complications

## 2013-01-17 NOTE — Interval H&P Note (Signed)
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 After consideration of risks, benefits and other options for treatment, the patient has consented to  Procedure(s): ARTHROSCOPY LEFT KNEE (Left) as a surgical intervention .  The patient's history has been reviewed, patient examined, no change in status, stable for surgery.  I have reviewed the patient's chart and labs.  Questions were answered to the patient's satisfaction.     [name]

## 2013-01-17 NOTE — Anesthesia Preprocedure Evaluation (Signed)
Anesthesia Evaluation  Patient identified by MRN, date of birth, ID band Patient awake    Reviewed: Allergy & Precautions, H&P , NPO status , Patient's Chart, lab work & pertinent test results  Airway Mallampati: II TM Distance: >3 FB Neck ROM: Full    Dental  (+) Edentulous Upper and Dental Advisory Given   Pulmonary  breath sounds clear to auscultation        Cardiovascular Rhythm:Regular Rate:Normal     Neuro/Psych    GI/Hepatic   Endo/Other    Renal/GU      Musculoskeletal   Abdominal   Peds  Hematology   Anesthesia Other Findings   Reproductive/Obstetrics                           Anesthesia Physical Anesthesia Plan  ASA: II  Anesthesia Plan: General   Post-op Pain Management:    Induction: Intravenous  Airway Management Planned: LMA  Additional Equipment:   Intra-op Plan:   Post-operative Plan:   Informed Consent: I have reviewed the patients History and Physical, chart, labs and discussed the procedure including the risks, benefits and alternatives for the proposed anesthesia with the patient or authorized representative who has indicated his/her understanding and acceptance.   Dental advisory given  Plan Discussed with: CRNA and Anesthesiologist  Anesthesia Plan Comments:         Anesthesia Quick Evaluation

## 2013-01-18 ENCOUNTER — Encounter (HOSPITAL_BASED_OUTPATIENT_CLINIC_OR_DEPARTMENT_OTHER): Payer: Self-pay | Admitting: Orthopaedic Surgery

## 2013-01-20 NOTE — Op Note (Signed)
NAMEMOYINOLUWA, DAWE                 ACCOUNT NO.:  [id number]  MEDICAL RECORD NO.:  [id number]  LOCATION:                               FACILITY:  [facility]  PHYSICIAN:  Lubertha Basque. Karmine Kauer, M.D.DATE OF BIRTH:  August 02, 1925  DATE OF PROCEDURE:  01/17/2013 DATE OF DISCHARGE:  01/17/2013                              OPERATIVE REPORT   PREOPERATIVE DIAGNOSES: 1. Left knee torn medial meniscus. 2. Left knee degenerative joint disease.  POSTOPERATIVE DIAGNOSES: 1. Left knee torn medial and torn lateral meniscus. 2. Left knee degenerative joint disease. 3. Left knee loose body.  PROCEDURES: 1. Left knee partial medial and partial lateral meniscectomy. 2. Left knee abrasion chondroplasty, lateral and patellofemoral. 3. Left knee removal of loose body.  ANESTHESIA:  General.  ATTENDING SURGEON:  Lubertha Basque. Jerl Santos, M.D.  ASSISTANT:  Elodia Florence, PA.  INDICATION FOR PROCEDURE:  The patient is an 79 year old woman with a long history of mechanical symptoms involving her left knee.  This locks and almost makes her fall.  This is persisted despite oral anti- inflammatories, bracing, and an injection.  She has findings consistent with a mechanical problem in the knee, and she is offered an arthroscopy.  Informed operative consent was obtained after discussion of possible complications including reaction to anesthesia and infection.  SUMMARY OF FINDINGS AND PROCEDURE:  Under general anesthesia, an arthroscopy of the left knee was performed.  The suprapatellar pouch was benign except for a fairly large cartilaginous loose body, which we removed.  She had some patellofemoral breakdown grade 4 focally and abrasion chondroplasty to bleeding bone was done at one tiny area.  The medial compartment exhibited some grade 3 change across the broad areas on the medial femoral condyle, a thorough chondroplasty was done here. She had a very small posterior horn medial meniscus tear addressed with about a  5% partial medial meniscectomy.  The ACL was normal.  The lateral compartment was notable for a large defect on the lateral femoral condyle likely where her loose body emanated from.  This was debrided back to stable edges, but she did have grade 4 change in this area and it was about half of her lateral femoral condyle surface. Lateral meniscus had a degenerative tear addressed with about 10% partial lateral meniscectomy.  She was injected and discharged home.  DESCRIPTION OF PROCEDURE:  The patient was taken to the operating suite where general anesthetic was applied without difficulty.  She was positioned supine and prepped and draped in normal sterile fashion. After administration of preop IV Kefzol and appropriate time-out, an arthroscopy of the left knee was performed through total of two portals. Findings were as noted above and procedure consisted predominantly of the removal of loose body, which measured about a 1.5 cm in its greatest diameter.  We also performed the abrasion chondroplasties as listed above and the meniscectomy was done with baskets and shavers.  She was irrigated and injected with Marcaine with epinephrine and morphine plus some Depo-Medrol.  Adaptic was applied to the portals followed by dry gauze and a loose Ace wrap.  Estimated blood loss and intraoperative fluids were obtained from anesthesia records.  No tourniquet was used.  DISPOSITION:  The patient was extubated in the operating room and taken to the recovery room in stable condition.  She was to go home same-day and follow up in the office next week.  I will contact her by phone tonight.     Lubertha Basque Jerl Santos, M.D.     PGD/MEDQ  D:  01/17/2013  T:  01/18/2013  Job:  [id number]

## 2013-04-14 ENCOUNTER — Other Ambulatory Visit: Payer: Self-pay

## 2013-04-14 DIAGNOSIS — Z1231 Encounter for screening mammogram for malignant neoplasm of breast: Secondary | ICD-10-CM

## 2013-05-12 ENCOUNTER — Other Ambulatory Visit: Payer: Self-pay

## 2013-05-12 ENCOUNTER — Ambulatory Visit
Admission: RE | Admit: 2013-05-12 | Discharge: 2013-05-12 | Disposition: A | Discharge status: Home / Self Care | Payer: Medicare Other | Source: Ambulatory Visit

## 2013-05-12 DIAGNOSIS — Z1231 Encounter for screening mammogram for malignant neoplasm of breast: Secondary | ICD-10-CM

## 2013-06-06 ENCOUNTER — Telehealth: Payer: Self-pay

## 2013-06-06 NOTE — Telephone Encounter (Signed)
Pt called requesting Rx Clotimazole-topical cream. [facility]. Please advise.

## 2013-06-07 ENCOUNTER — Telehealth: Payer: Self-pay | Admitting: *Deleted

## 2013-06-07 ENCOUNTER — Telehealth: Payer: Self-pay

## 2013-06-07 MED ORDER — CLOTRIMAZOLE-BETAMETHASONE 1-0.05 % EX CREA: 1.0000 "application " | TOPICAL_CREAM | Freq: Two times a day (BID) | CUTANEOUS | Status: DC

## 2013-06-07 MED ORDER — NYSTATIN 100000 UNIT/GM EX CREA: 1.0000 "application " | TOPICAL_CREAM | Freq: Two times a day (BID) | CUTANEOUS | Status: DC

## 2013-06-07 NOTE — Telephone Encounter (Signed)
lmom to pt 

## 2013-06-07 NOTE — Telephone Encounter (Signed)
RX WE CALLED IN COST HER 46$ ASKING CAN WE CHANGE IT TO WHAT SHE HAS USED BEFORE?  CLOTRIMAZOLE "B" Garrett 6$

## 2013-06-07 NOTE — Telephone Encounter (Signed)
Pt called requesting topical cream for vaginal area. Please advise. [facility]

## 2013-06-20 ENCOUNTER — Ambulatory Visit: Payer: Self-pay | Admitting: Physician Assistant

## 2013-06-21 ENCOUNTER — Encounter: Payer: Self-pay | Admitting: Emergency Medicine

## 2013-06-21 ENCOUNTER — Ambulatory Visit (INDEPENDENT_AMBULATORY_CARE_PROVIDER_SITE_OTHER): Payer: Medicare Other | Admitting: Emergency Medicine

## 2013-06-21 VITALS — BP 124/70 | HR 62 | Temp 98.0°F | Resp 16 | Ht 64.5 in | Wt 146.0 lb

## 2013-06-21 DIAGNOSIS — R197 Diarrhea, unspecified: Secondary | ICD-10-CM

## 2013-06-21 DIAGNOSIS — R350 Frequency of micturition: Secondary | ICD-10-CM

## 2013-06-21 DIAGNOSIS — J309 Allergic rhinitis, unspecified: Secondary | ICD-10-CM

## 2013-06-21 DIAGNOSIS — R5381 Other malaise: Secondary | ICD-10-CM

## 2013-06-21 DIAGNOSIS — R5383 Other fatigue: Principal | ICD-10-CM

## 2013-06-21 LAB — HEPATIC FUNCTION PANEL
ALT: 13 U/L (ref 0–35)
AST: 17 U/L (ref 0–37)
Albumin: 4.3 g/dL (ref 3.5–5.2)
Alkaline Phosphatase: 63 U/L (ref 39–117)
BILIRUBIN DIRECT: 0.2 mg/dL (ref 0.0–0.3)
BILIRUBIN INDIRECT: 1 mg/dL (ref 0.2–1.2)
BILIRUBIN TOTAL: 1.2 mg/dL (ref 0.2–1.2)
Total Protein: 6.6 g/dL (ref 6.0–8.3)

## 2013-06-21 LAB — CBC WITH DIFFERENTIAL/PLATELET
BASOS PCT: 0 % (ref 0–1)
Basophils Absolute: 0 10*3/uL (ref 0.0–0.1)
EOS ABS: 0.1 10*3/uL (ref 0.0–0.7)
Eosinophils Relative: 2 % (ref 0–5)
HCT: 39.3 % (ref 36.0–46.0)
HEMOGLOBIN: 13.6 g/dL (ref 12.0–15.0)
Lymphocytes Relative: 26 % (ref 12–46)
Lymphs Abs: 1.8 10*3/uL (ref 0.7–4.0)
MCH: 29.6 pg (ref 26.0–34.0)
MCHC: 34.6 g/dL (ref 30.0–36.0)
MCV: 85.4 fL (ref 78.0–100.0)
Monocytes Absolute: 0.6 10*3/uL (ref 0.1–1.0)
Monocytes Relative: 8 % (ref 3–12)
NEUTROS PCT: 64 % (ref 43–77)
Neutro Abs: 4.4 10*3/uL (ref 1.7–7.7)
PLATELETS: 266 10*3/uL (ref 150–400)
RBC: 4.6 MIL/uL (ref 3.87–5.11)
RDW: 14 % (ref 11.5–15.5)
WBC: 6.9 10*3/uL (ref 4.0–10.5)

## 2013-06-21 LAB — BASIC METABOLIC PANEL WITH GFR
BUN: 15 mg/dL (ref 6–23)
CHLORIDE: 103 meq/L (ref 96–112)
CO2: 28 mEq/L (ref 19–32)
Calcium: 9.4 mg/dL (ref 8.4–10.5)
Creat: 0.66 mg/dL (ref 0.50–1.10)
GFR, EST NON AFRICAN AMERICAN: 81 mL/min
GFR, Est African American: >89 mL/min
Glucose, Bld: 89 mg/dL (ref 70–99)
POTASSIUM: 4.2 meq/L (ref 3.5–5.3)
Sodium: 139 mEq/L (ref 135–145)

## 2013-06-21 LAB — TSH: TSH: 2.235 u[IU]/mL (ref 0.350–4.500)

## 2013-06-21 NOTE — Progress Notes (Signed)
Subjective:    [name], female    DOB: [birth year], 80 y.o.   MRN: [id number]  HPI Comments: 80 yo WF with multiple complaints. She is feeling fatigued and has occasional left abdomen pain on/off with out trigger. She notes pain is worse after eating with loose stools. She notes those symptoms have been going on for about a week and she notes mild palpitations on/ off x 1 week and increased bruising. She denies dysuria but earlier in the week she was having frequent urine. All symptoms better x 2 days except for her feeling tired. She notes increased stress and notes it has made stomach feel more queasy. She denies any new exposures.    She has been feeling more depressed with decreased activity with knee pain. She notes improvement with PT but has not been active since d/c PT.  Abdominal Pain Associated symptoms include dysuria and nausea.  Palpitations  Associated symptoms include nausea.  Dysuria  Associated symptoms include nausea.     Medication List       This list is accurate as of: 06/21/13 10:44 AM.  Always use your most recent med list.               Azelastine-Fluticasone 137-50 MCG/ACT Susp  Place 1-2 puffs into the nose at bedtime.     Biotin 1000 MCG tablet  Take 500 mcg by mouth daily.     CALCIUM 600+D HIGH POTENCY 600-400 MG-UNIT per tablet  Generic drug:  Calcium Carbonate-Vitamin D  Take 1 tablet by mouth daily.     cholecalciferol 1000 UNITS tablet  Commonly known as:  VITAMIN D  Take 1,000 Units by mouth daily.     clotrimazole-betamethasone cream  Commonly known as:  LOTRISONE  Apply 1 application topically 2 (two) times daily.     diazepam 5 MG tablet  Commonly known as:  VALIUM  Take 5 mg by mouth every 8 (eight) hours as needed for anxiety.     levothyroxine 75 MCG tablet  Commonly known as:  SYNTHROID, LEVOTHROID  Take 75 mcg by mouth daily.     meloxicam 15 MG tablet  Commonly known as:  MOBIC  Take 15 mg by mouth  daily.     nystatin cream  Commonly known as:  MYCOSTATIN  Apply 1 application topically 2 (two) times daily.     Potassium Gluconate 595 MG Caps  Take 595 mg by mouth daily.     simvastatin 20 MG tablet  Commonly known as:  ZOCOR  Take 20 mg by mouth daily.     vitamin C 500 MG tablet  Commonly known as:  ASCORBIC ACID  Take 500 mg by mouth daily.     vitamin E 400 UNIT capsule  Generic drug:  vitamin E  Take 400 Units by mouth daily.       Allergies  Allergen Reactions  . Ciprofloxacin   . Epinephrine Other (See Comments)    Heart raced   . Etodolac Hives  . Lidocaine   . Synephrine     Palpitations   . Flagyl [Metronidazole Hcl] Rash   Past Medical History  Diagnosis Date  . Breast cancer   . Hypercholesteremia   . Hypothyroidism   . Internal hemorrhoids without mention of complication   . Family history of malignant neoplasm of gastrointestinal tract 05/06/2011  . Wears glasses   . Wears dentures     top      Review of Systems  Constitutional: Positive for fatigue.  HENT: Positive for tinnitus.   Cardiovascular: Positive for palpitations.  Gastrointestinal: Positive for nausea and abdominal pain.  Genitourinary: Positive for dysuria.  Hematological: Bruises/bleeds easily.  All other systems reviewed and are negative.  BP 124/70  Pulse 62  Temp(Src) 98 F (36.7 C) (Temporal)  Resp 16  Ht 5' 4.5" (1.638 m)  Wt 146 lb (66.225 kg)  BMI 24.68 kg/m2     Objective:   Physical Exam  Nursing note and vitals reviewed. Constitutional: She is oriented to person, place, and time. She appears well-developed and well-nourished. No distress.  HENT:  Head: Normocephalic and atraumatic.  Right Ear: External ear normal.  Left Ear: External ear normal.  Nose: Nose normal.  Mouth/Throat: Oropharynx is clear and moist. No oropharyngeal exudate.  Cloudy TM's bilaterally   Eyes: Conjunctivae and EOM are normal.  Neck: Normal range of motion. Neck supple. No  JVD present. No thyromegaly present.  Cardiovascular: Normal rate, regular rhythm, normal heart sounds and intact distal pulses.   Pulmonary/Chest: Effort normal and breath sounds normal.  Abdominal: Soft. Bowel sounds are normal. She exhibits no distension and no mass. There is no tenderness. There is no rebound and no guarding.  Musculoskeletal: Normal range of motion. She exhibits no edema and no tenderness.  Lymphadenopathy:    She has no cervical adenopathy.  Neurological: She is alert and oriented to person, place, and time. No cranial nerve deficit.  Skin: Skin is warm and dry. No rash noted. No erythema. No pallor.  Minimal ecchymosis UE  Psychiatric: She has a normal mood and affect. Her behavior is normal. Judgment and thought content normal.          Assessment & Plan:  1. Probably Viral gastritis vs colitis vs GERD- Restora 1 QD, Bland diet x 1 week, push fluids, Check labs, if any increased symptoms ER. Nexium 40 mg SX #10  2. Fatigue- check labs, increase activity and H2O, with history ? Palpitations, w/c if SX increase or ER.   3. Urine frequency - check labs  4. Tinnitus vs Allergic rhinitis- Claritin/ Allegra OTC, increase H2o, allergy hygiene explained.  5. Mild depression with knee troubles- keep ortho f/u and increase activity- chair exercises  OVER 40 minutes of exam, counseling, chart review, referral performed

## 2013-06-21 NOTE — Patient Instructions (Signed)
Tinnitus Sounds you hear in your ears and coming from within the ear is called tinnitus. This can be a symptom of many ear disorders. It is often associated with hearing loss.  Tinnitus can be seen with:  Infections.  Ear blockages such as wax buildup.  Meniere's disease.  Ear damage.  Inherited.  Occupational causes. While irritating, it is not usually a threat to health. When the cause of the tinnitus is wax, infection in the middle ear, or foreign body it is easily treated. Hearing loss will usually be reversible.  TREATMENT  When treating the underlying cause does not get rid of tinnitus, it may be necessary to get rid of the unwanted sound by covering it up with more pleasant background noises. This may include music, the radio etc. There are tinnitus maskers which can be worn which produce background noise to cover up the tinnitus. Avoid all medications which tend to make tinnitus worse such as alcohol, caffeine, aspirin, and nicotine. There are many soothing background tapes such as rain, ocean, thunderstorms, etc. These soothing sounds help with sleeping or resting. Keep all follow-up appointments and referrals. This is important to identify the cause of the problem. It also helps avoid complications, impaired hearing, disability, or chronic pain. Document Released: 02/16/2005 Document Revised: 05/11/2011 Document Reviewed: 10/05/2007 [facility] Patient Information 2014 [place]. Viral Gastroenteritis Viral gastroenteritis is also called stomach flu. This illness is caused by a certain type of germ (virus). It can cause sudden watery poop (diarrhea) and throwing up (vomiting). This can cause you to lose body fluids (dehydration). This illness usually lasts for 3 to 8 days. It usually goes away on its own. HOME CARE   Drink enough fluids to keep your pee (urine) clear or pale yellow. Drink small amounts of fluids often.  Ask your doctor how to replace body fluid losses  (rehydration).  Avoid:  Foods high in sugar.  Alcohol.  Bubbly (carbonated) drinks.  Tobacco.  Juice.  Caffeine drinks.  Very hot or cold fluids.  Fatty, greasy foods.  Eating too much at one time.  Dairy products until 24 to 48 hours after your watery poop stops.  You may eat foods with active cultures (probiotics). They can be found in some yogurts and supplements.  Wash your hands well to avoid spreading the illness.  Only take medicines as told by your doctor. Do not give aspirin to children. Do not take medicines for watery poop (antidiarrheals).  Ask your doctor if you should keep taking your regular medicines.  Keep all doctor visits as told. GET HELP RIGHT AWAY IF:   You cannot keep fluids down.  You do not pee at least once every 6 to 8 hours.  You are short of breath.  You see blood in your poop or throw up. This may look like coffee grounds.  You have belly (abdominal) pain that gets worse or is just in one small spot (localized).  You keep throwing up or having watery poop.  You have a fever.  The patient is a child younger than 3 months, and he or she has a fever.  The patient is a child older than 3 months, and he or she has a fever and problems that do not go away.  The patient is a child older than 3 months, and he or she has a fever and problems that suddenly get worse.  The patient is a baby, and he or she has no tears when crying. MAKE SURE YOU:  Understand these instructions.  Will watch your condition.  Will get help right away if you are not doing well or get worse. Document Released: 08/05/2007 Document Revised: 05/11/2011 Document Reviewed: 12/03/2010 [facility] Patient Information 2014 [place]. Diet for Gastroesophageal Reflux Disease, Adult Reflux is when stomach acid flows up into the esophagus. The esophagus becomes irritated and sore (inflammation). When reflux happens often and is severe, it is called  gastroesophageal reflux disease (GERD). What you eat can help ease any discomfort caused by GERD. FOODS OR DRINKS TO AVOID OR LIMIT  Coffee and black tea, with or without caffeine.  Bubbly (carbonated) drinks with caffeine or energy drinks.  Strong spices, such as pepper, cayenne pepper, curry, or chili powder.  Peppermint or spearmint.  Chocolate.  High-fat foods, such as meats, fried food, oils, butter, or nuts.  Fruits and vegetables that cause discomfort. This includes citrus fruits and tomatoes.  Alcohol. If a certain food or drink irritates your GERD, avoid eating or drinking it. THINGS THAT MAY HELP GERD INCLUDE:  Eat meals slowly.  Eat 5 to 6 small meals a day, not 3 large meals.  Do not eat food for a certain amount of time if it causes discomfort.  Wait 3 hours after eating before lying down.  Keep the head of your bed raised 6 to 9 inches (15 23 centimeters). Put a foam wedge or blocks under the legs of the bed.  Stay active. Weight loss, if needed, may help ease your discomfort.  Wear loose-fitting clothing.  Do not smoke or chew tobacco. Document Released: 08/18/2011 Document Reviewed: 08/18/2011 [facility] Patient Information 2014 [place].

## 2013-06-22 ENCOUNTER — Ambulatory Visit: Payer: Self-pay | Admitting: Internal Medicine

## 2013-06-22 LAB — URINALYSIS, ROUTINE W REFLEX MICROSCOPIC
BILIRUBIN URINE: NEGATIVE
GLUCOSE, UA: NEGATIVE mg/dL
Hgb urine dipstick: NEGATIVE
Ketones, ur: NEGATIVE mg/dL
Leukocytes, UA: NEGATIVE
Nitrite: NEGATIVE
PROTEIN: NEGATIVE mg/dL
Specific Gravity, Urine: 1.009 (ref 1.005–1.030)
Urobilinogen, UA: 0.2 mg/dL (ref 0.0–1.0)
pH: 6.5 (ref 5.0–8.0)

## 2013-06-22 LAB — URINE CULTURE
Colony Count: NO GROWTH
Organism ID, Bacteria: NO GROWTH

## 2013-06-23 ENCOUNTER — Telehealth: Payer: Self-pay

## 2013-06-23 NOTE — Telephone Encounter (Signed)
Pt called c/o weakness and is concerned about cholesterol. Pt was in for a sick visit so cholesterol was not checked. Pt advised to go to ER/Urgent care if having increased symptoms.

## 2013-08-02 ENCOUNTER — Telehealth: Payer: Self-pay | Admitting: Gastroenterology

## 2013-08-02 NOTE — Telephone Encounter (Signed)
Pts last colon was done in 2009. Pt is calling to find out when she needs a repeat colon done. States she is having a lot of lower abdominal pain and is concerned. Please advise.

## 2013-08-02 NOTE — Telephone Encounter (Signed)
She needs an office evaluation

## 2013-08-02 NOTE — Telephone Encounter (Signed)
Pt states she has been having abdominal pain and thinks she could have an infection in her colon or possible diverticulitis. Pt scheduled to see Alonza Bogus PA 08/04/13@10am . Pt aware of appt.

## 2013-08-03 ENCOUNTER — Encounter: Payer: Self-pay | Admitting: *Deleted

## 2013-08-04 ENCOUNTER — Other Ambulatory Visit (INDEPENDENT_AMBULATORY_CARE_PROVIDER_SITE_OTHER): Payer: Medicare Other

## 2013-08-04 ENCOUNTER — Encounter: Payer: Self-pay | Admitting: Gastroenterology

## 2013-08-04 ENCOUNTER — Ambulatory Visit (INDEPENDENT_AMBULATORY_CARE_PROVIDER_SITE_OTHER): Payer: Medicare Other | Admitting: Gastroenterology

## 2013-08-04 VITALS — BP 130/66 | HR 64 | Ht 64.25 in | Wt 148.2 lb

## 2013-08-04 DIAGNOSIS — R197 Diarrhea, unspecified: Secondary | ICD-10-CM

## 2013-08-04 DIAGNOSIS — R109 Unspecified abdominal pain: Secondary | ICD-10-CM

## 2013-08-04 LAB — IGA: IgA: 98 mg/dL (ref 68–378)

## 2013-08-04 MED ORDER — DICYCLOMINE HCL 10 MG PO CAPS: 10.0000 mg | ORAL_CAPSULE | Freq: Three times a day (TID) | ORAL | Status: DC

## 2013-08-04 NOTE — Progress Notes (Signed)
     08/04/2013 ANIEYA HELMAN [id number] January 28, 1926   History of Present Illness:  This is an 80 year old female who is known to Dr. Deatra Ina for previous colonoscopy.  Her last colonoscopy was in November 2011 at which time she was found to have diverticulosis, internal hemorrhoids, and what was thought to be of a colon polyp; biopsy showed only benign polypoid mucosa. She was treated in 2013 for what was initially suspected to be diverticulitis, however, CT scan actually showed epiploic appendicitis. She completed a course of Cipro and Flagyl with resolution of the symptoms at that time.  She comes to our office today with complaints of intermittent bilateral lower abdominal pains/cramping, particularly for the past 14 days or so; they come and go.  Do not keep her from sleeping at night.  No nausea, vomiting, fever, or chills.  She also reports that she constantly feels bloated and has suffered from loose stools with urgency and sometimes incontinence when eating certain things for the past 2-3 years. She states that she thinks she has a "nervous stomach". She constantly is fearful to eat in public because she knows that she will have to move her bowels and sometimes has accidents.  Denies seeing blood in her stool.  No weight loss.   Current Medications, Allergies, Past Medical History, Past Surgical History, Family History and Social History were reviewed in [employer] record.   Physical Exam: BP 130/66  Pulse 64  Ht 5' 4.25" (1.632 m)  Wt 148 lb 3.2 oz (67.223 kg)  BMI 25.24 kg/m2 General: Well developed white female in no acute distress; looks younger than reported age Head: Normocephalic and atraumatic Eyes:  Sclerae anicteric, conjunctiva pink  Ears: Normal auditory acuity Lungs: Clear throughout to auscultation Heart: Regular rate and rhythm Abdomen: Soft, non-distended.  Normal bowel sounds.  Minimal lower abdominal TTP without R/R/G. Musculoskeletal:  Symmetrical with no gross deformities  Extremities: No edema  Neurological: Alert oriented x 4, grossly non-focal Psychological:  Alert and cooperative. Normal mood and affect  Assessment and Recommendations: -Diarrhea (chronic) and intermittent lower abdominal pains with bloating:  Possibly has some irritable bowel syndrome. She describes what she calls a "nervous stomach". Will check celiac labs to rule that out as a source of her symptoms. She will try a lactose-free diet for several weeks and see if that allows any improvement. We will also give her a prescription for dicyclomine 10 mg to take twice daily.  We discussed the use of Imodium as needed.  Follow-up in 4-8 weeks.

## 2013-08-04 NOTE — Patient Instructions (Addendum)
Please make a follow up visit in 6-8 weeks with Dr. Deatra Ina  Your physician has requested that you go to the basement for the following lab work before leaving today: TTG IGA  We have sent the following medications to your pharmacy for you to pick up at your convenience: Bentyl 10 mg  Information on Lactose Free Diet is below for your review. ____________________________________________________________________________________________  Lactose-Free Diet Lactose is a carbohydrate that is found mainly in milk and milk products, as well as in foods with added milk or whey. Lactose must be digested by the enzyme lactase in order to be used by the body. Lactose intolerance occurs when there is a shortage of lactase. When your body is not able to digest lactose, you may feel sick to your stomach (nausea), bloated, and have cramps, gas, and diarrhea. TYPES OF LACTASE DEFICIENCY  Primary lactase deficiency. This is the most common type. It is characterized by a slow decrease in lactase activity.  Secondary lactase deficiency. This occurs following injury to the small intestinal mucosa as a result of a disease or condition. It can also occur as a result of surgery or after treatment with antibiotic medicines or cancer drugs. Tolerance to lactose varies widely. Each person must determine how much milk can be consumed without developing symptoms. Drinking smaller portions of milk throughout the day may be helpful. Some studies suggest that slowing gastric emptying may help increase tolerance of milk products. This may be done by:  Consuming milk or milk products with a meal rather than alone.  Consuming milk with a higher fat content. There are many dairy products that may be tolerated better than milk by some people, including:  Cheese (especially aged cheese). The lactose content is much lower than in milk.  Cultured dairy products, such as yogurt, buttermilk, cottage cheese, and sweet acidophilus  milk (kefir). These products are usually well tolerated by lactase-deficient people. This is because the healthy bacteria help digest lactose.  Lactose-hydrolyzed milk. This product contains 40% to 90% less lactose than milk and may also be well tolerated. ADEQUACY These diets may be deficient in calcium, riboflavin, and vitamin D, according to the Recommended Dietary Allowances of the [employer]. Depending on individual tolerances and the use of milk substitutes, milk, or other dairy products, you may be able to meet these recommendations. SPECIAL NOTES  Lactose is a carbohydrate. The main food source for lactose is dairy products. Reading food labels is important. Many products contain lactose even when they are not made from milk. Look for the following words: whey, milk solids, dry milk solids, nonfat dry milk powder. Typical sources of lactose other than dairy products include breads, candies, cold cuts, prepared and processed foods, and commercial sauces and gravies.  All foods must be prepared without milk, cream, or other dairy foods.  A vitamin or mineral supplement may be necessary. Consult your caregiver or Registered Dietitian.  Lactose is also found in many prescription and over-the-counter medicines.  Soy milk and lactose-free supplements may be used as an alternative to milk. CHOOSING FOODS Breads and Starches  Allowed: Breads and rolls made without milk. [place], [place], or [place] bread. Soda crackers, graham crackers. Any crackers prepared without lactose. Cooked or dry cereals prepared without lactose (read labels). Any potatoes, pasta, or rice prepared without milk or lactose. Popcorn.  Avoid: Breads and rolls that contain milk. Prepared mixes such as muffins, biscuits, waffles, pancakes. Sweet rolls, donuts, [place] toast (if made with milk or lactose). Zwieback  crackers, corn curls, or any crackers that contain lactose. Cooked or dry cereals prepared with  lactose (read labels). Instant potatoes, frozen [place] fries, scalloped or au gratin potatoes. Vegetables  Allowed: Fresh, frozen, and canned vegetables.  Avoid: Creamed or breaded vegetables. Vegetables in a cheese sauce or with lactose-containing margarines. Fruit  Allowed: All fresh, canned, or frozen fruits that are not processed with lactose.  Avoid: Any canned or frozen fruits processed with lactose. Meat and Meat Substitutes  Allowed: Plain beef, chicken, fish, [place], lamb, veal, pork, or ham. Kosher prepared meat products. Strained or junior meats that do not contain milk. Eggs, soy meat substitutes, nuts.  Avoid: Scrambled eggs, omelets, and souffles that contain milk. Creamed or breaded meat, fish, or fowl. Sausage products such as wieners, liver sausage, or cold cuts that contain milk solids. Cheese, cottage cheese, or cheese spreads. Milk  Allowed: None.  Avoid: Milk (whole, 2%, skim, or chocolate). Evaporated, powdered, or condensed milk. Malted milk. Soups and Combination Foods  Allowed: Bouillon, broth, vegetable soups, clear soups, consomms. Homemade soups made with allowed ingredients. Combination or prepared foods that do not contain milk or milk products (read labels).  Avoid: Cream soups, chowders, commercially prepared soups containing lactose. Macaroni and cheese, pizza. Combination or prepared foods that contain milk or milk products. Desserts and Sweets  Allowed: Water and fruit ices, gelatin, angel food cake. Homemade cookies, pies, or cakes made from allowed ingredients. Pudding (if made with water or a milk substitute). Lactose-free tofu desserts. Sugar, honey, corn syrup, jam, jelly, marmalade, molasses (beet sugar). Pure sugar candy, marshmallows.  Avoid: Ice cream, ice milk, sherbet, custard, pudding, frozen yogurt. Commercial cake and cookie mixes. Desserts that contain chocolate. Pie crust made with milk-containing margarine. Reduced calorie desserts  made with a sugar substitute that contains lactose. Toffee, peppermint, butterscotch, chocolate, caramels. Fats and Oils  Allowed: Butter (as tolerated, contains very small amounts of lactose). Margarines and dressings that do not contain milk. Vegetable oils, shortening, mayonnaise, nondairy cream and whipped toppings without lactose or milk solids added. Berniece Salines.  Avoid: Margarines and salad dressings containing milk. Cream, cream cheese, peanut butter with added milk solids, sour cream, chip dips made with sour cream. Beverages  Allowed: Carbonated drinks, tea, coffee and freeze-dried coffee, some instant coffees (check labels). Fruit drinks, fruit and vegetable juice, rice or soy milk.  Avoid: Hot chocolate. Some cocoas, some instant coffees, instant iced teas, powdered fruit drinks (read labels). Condiments  Allowed: Soy sauce, carob powder, olives, gravy made with water, baker's cocoa, pickles, pure seasonings and spices, wine, pure monosodium glutamate, catsup, mustard.  Avoid: Some chewing gums, chocolate, some cocoas. Certain antibiotics and vitamin or mineral preparations. Spice blends if they contain milk products. MSG extender. Artificial sweeteners that contain lactose. Some nondairy creamers (read labels). SAMPLE MENU Breakfast  Orange juice.  Banana.  Bran cereal.  Nondairy creamer.  [place] bread, toasted.  Butter or milk-free margarine.  Coffee or tea. Lunch  Chicken breast.  Rice.  Green beans.  Butter or milk-free margarine.  Fresh melon.  Coffee or tea. Dinner  [employer].  Baked potato.  Butter or milk-free margarine.  Broccoli.  Lettuce salad with vinegar and oil dressing.  [employer]  Coffee or tea. Document Released: 08/08/2001 Document Revised: 05/11/2011 Document Reviewed: 05/16/2010 [facility] Patient Information 2014 [place], [place].

## 2013-08-07 LAB — T-TRANSGLUTAMINASE (TTG) IGG: TISSUE TRANSGLUT AB: 3 U/mL (ref 0–5)

## 2013-08-07 NOTE — Progress Notes (Signed)
Reviewed and agree with management. Kari Kerth D. Shareece Bultman, M.D., FACG  

## 2013-10-18 ENCOUNTER — Other Ambulatory Visit: Payer: Self-pay | Admitting: Physician Assistant

## 2013-10-18 ENCOUNTER — Ambulatory Visit (INDEPENDENT_AMBULATORY_CARE_PROVIDER_SITE_OTHER): Payer: Medicare Other | Admitting: Physician Assistant

## 2013-10-18 ENCOUNTER — Encounter: Payer: Self-pay | Admitting: Physician Assistant

## 2013-10-18 VITALS — BP 138/78 | HR 60 | Temp 97.7°F | Resp 16 | Ht 64.0 in | Wt 145.0 lb

## 2013-10-18 DIAGNOSIS — K635 Polyp of colon: Secondary | ICD-10-CM

## 2013-10-18 DIAGNOSIS — Z23 Encounter for immunization: Secondary | ICD-10-CM

## 2013-10-18 DIAGNOSIS — Z0000 Encounter for general adult medical examination without abnormal findings: Secondary | ICD-10-CM

## 2013-10-18 DIAGNOSIS — Z853 Personal history of malignant neoplasm of breast: Secondary | ICD-10-CM

## 2013-10-18 DIAGNOSIS — E039 Hypothyroidism, unspecified: Secondary | ICD-10-CM

## 2013-10-18 DIAGNOSIS — E7800 Pure hypercholesterolemia, unspecified: Secondary | ICD-10-CM

## 2013-10-18 DIAGNOSIS — Z789 Other specified health status: Secondary | ICD-10-CM

## 2013-10-18 DIAGNOSIS — Z1331 Encounter for screening for depression: Secondary | ICD-10-CM

## 2013-10-18 DIAGNOSIS — M1990 Unspecified osteoarthritis, unspecified site: Secondary | ICD-10-CM

## 2013-10-18 DIAGNOSIS — E538 Deficiency of other specified B group vitamins: Secondary | ICD-10-CM

## 2013-10-18 DIAGNOSIS — E559 Vitamin D deficiency, unspecified: Secondary | ICD-10-CM

## 2013-10-18 LAB — BASIC METABOLIC PANEL WITH GFR
BUN: 16 mg/dL (ref 6–23)
CO2: 28 meq/L (ref 19–32)
CREATININE: 0.76 mg/dL (ref 0.50–1.10)
Calcium: 9.5 mg/dL (ref 8.4–10.5)
Chloride: 105 mEq/L (ref 96–112)
GFR, Est African American: 82 mL/min
GFR, Est Non African American: 71 mL/min
GLUCOSE: 86 mg/dL (ref 70–99)
Potassium: 4.3 mEq/L (ref 3.5–5.3)
Sodium: 141 mEq/L (ref 135–145)

## 2013-10-18 LAB — CBC WITH DIFFERENTIAL/PLATELET
Basophils Absolute: 0 10*3/uL (ref 0.0–0.1)
Basophils Relative: 0 % (ref 0–1)
Eosinophils Absolute: 0.2 10*3/uL (ref 0.0–0.7)
Eosinophils Relative: 3 % (ref 0–5)
HEMATOCRIT: 39.6 % (ref 36.0–46.0)
Hemoglobin: 13.4 g/dL (ref 12.0–15.0)
Lymphocytes Relative: 29 % (ref 12–46)
Lymphs Abs: 1.8 10*3/uL (ref 0.7–4.0)
MCH: 29.5 pg (ref 26.0–34.0)
MCHC: 33.8 g/dL (ref 30.0–36.0)
MCV: 87.2 fL (ref 78.0–100.0)
MONO ABS: 0.6 10*3/uL (ref 0.1–1.0)
MONOS PCT: 9 % (ref 3–12)
NEUTROS ABS: 3.7 10*3/uL (ref 1.7–7.7)
Neutrophils Relative %: 59 % (ref 43–77)
Platelets: 255 10*3/uL (ref 150–400)
RBC: 4.54 MIL/uL (ref 3.87–5.11)
RDW: 13.9 % (ref 11.5–15.5)
WBC: 6.2 10*3/uL (ref 4.0–10.5)

## 2013-10-18 LAB — HEPATIC FUNCTION PANEL
ALT: 14 U/L (ref 0–35)
AST: 19 U/L (ref 0–37)
Albumin: 4.4 g/dL (ref 3.5–5.2)
Alkaline Phosphatase: 69 U/L (ref 39–117)
BILIRUBIN DIRECT: 0.2 mg/dL (ref 0.0–0.3)
Indirect Bilirubin: 0.8 mg/dL (ref 0.2–1.2)
TOTAL PROTEIN: 6.5 g/dL (ref 6.0–8.3)
Total Bilirubin: 1 mg/dL (ref 0.2–1.2)

## 2013-10-18 LAB — TSH: TSH: 7.31 u[IU]/mL — ABNORMAL HIGH (ref 0.350–4.500)

## 2013-10-18 LAB — VITAMIN B12: Vitamin B-12: 567 pg/mL (ref 211–911)

## 2013-10-18 LAB — LIPID PANEL
CHOLESTEROL: 167 mg/dL (ref 0–200)
HDL: 57 mg/dL (ref >39)
LDL CALC: 81 mg/dL (ref 0–99)
TRIGLYCERIDES: 143 mg/dL (ref <150)
Total CHOL/HDL Ratio: 2.9 Ratio
VLDL: 29 mg/dL (ref 0–40)

## 2013-10-18 MED ORDER — DIAZEPAM 5 MG PO TABS: 5.0000 mg | ORAL_TABLET | Freq: Three times a day (TID) | ORAL | Status: DC | PRN

## 2013-10-18 MED ORDER — ESTROGENS, CONJUGATED 0.625 MG/GM VA CREA: 1.0000 | TOPICAL_CREAM | Freq: Every day | VAGINAL | Status: DC

## 2013-10-18 NOTE — Patient Instructions (Signed)
   Benefiber is good for constipation/diarrhea/irritable bowel syndrome, it helps with weight loss and can help lower your bad cholesterol. Please do 1-2 TBSP in the morning in water, coffee, or tea. It can take up to a month before you can see a difference with your bowel movements. It is cheapest from [employer], [employer], [employer].    

## 2013-10-18 NOTE — Progress Notes (Signed)
MEDICARE ANNUAL WELLNESS VISIT AND CPE  Assessment:   1. HYPOTHYROIDISM - TSH  2. Personal history of malignant neoplasm of breast Continue MGM  3. DEGENERATIVE JOINT DISEASE Increase walking, follow up with ortho  4. Hypercholesteremia - CBC with Differential - BASIC METABOLIC PANEL WITH GFR - Hepatic function panel - Lipid panel - Urinalysis, Routine w reflex microscopic - Microalbumin / creatinine urine ratio - EKG 12-Lead - [place], RETROPERITNL ABD,  LTD  5. Colon polyps Colonoscopy- patient declines a colonoscopy even though the risks and benefits were discussed at length. Colon cancer is 3rd most diagnosed cancer and 2nd leading cause of death in both men and women 39 years of age and older. Patient understands the risk of cancer and death with declining the test however they are willing to do cologuard screening instead. They understand that this is not as sensitive or specific as a colonoscopy and they are still recommended to get a colonoscopy. The cologuard will be sent out to their house.   6. Unspecified vitamin D deficiency - Vit D  25 hydroxy (rtn osteoporosis monitoring)  7. B12 deficiency - Vitamin B12 - HM DIABETES FOOT EXAM  8. Vaginal atrophy Premarin cream called in  Discussed small amount gets absorbed systemically, understands risks  9. PREVNAR 13  Plan:   During the course of the visit the patient was educated and counseled about appropriate screening and preventive services including:    Pneumococcal vaccine   Influenza vaccine  Td vaccine  Screening electrocardiogram  Screening mammography  Bone densitometry screening  Colorectal cancer screening  Diabetes screening  Glaucoma screening  Nutrition counseling   Advanced directives: given information/requested  Screening recommendations, referrals:  Vaccinations: Tdap vaccine not indicated Influenza vaccine requested Pneumococcal vaccine Needs Prevnar Shingles vaccine  undecided Hep B vaccine not indicated  Nutrition assessed and recommended  Colonoscopy declined due to age, she would like to do cologuard Mammogram due 04/2013 Pap smear not indicated Pelvic exam not indicated Recommended yearly ophthalmology/optometry visit for glaucoma screening and checkup Recommended yearly dental visit for hygiene and checkup Advanced directives - requested  Conditions/risks identified: BMI: Discussed weight loss, diet, and increase physical activity.  Increase physical activity: AHA recommends 150 minutes of physical activity a week.  Medications reviewed DEXA- declined Urinary Incontinence is not an issue: discussed non pharmacology and pharmacology options.  Fall risk: low- discussed PT, home fall assessment, medications.   Subjective:   Alexandria Davies is a 80 y.o. female who presents for Medicare Annual Wellness Visit and complete physical.    Date of last medicare wellness visit is unknown.   Her blood pressure has been controlled at home, today their BP is BP: 138/78 mmHg She does not workout due to left knee pain, she does stay active at home. She denies chest pain, shortness of breath, dizziness.  She is on cholesterol medication and denies myalgias. Her cholesterol is not at goal. The cholesterol last visit was:   Lab Results  Component Value Date   CHOL 245* 04/01/2006   HDL 56 04/01/2006   LDLCALC 148* 04/01/2006   TRIG 207* 04/01/2006   CHOLHDL 4.4 04/01/2006    Last A1C in the office was: 5.5 Patient is on Vitamin D supplement.   Lab Results  Component Value Date   VD25OH 33 04/12/2009     Has diverticulosis and IBS, sees Dr. Deatra Ina and has occ diarrhea.   Names of Other Physician/Practitioners you currently use: 1. [facility]- here  for primary care 2. [place], eye doctor, last visit March/April 2015 3. Dr. Tenna Child , dentist, Sept 2 is next visit Patient Care Team: Unk Pinto, MD as PCP -  General (Internal Medicine) Hessie Dibble, MD as Consulting Physician (Orthopedic Surgery) Deneise Lever, MD as Consulting Physician (Pulmonary Disease) Inda Castle, MD as Consulting Physician (Gastroenterology) Ailene Rud, MD as Consulting Physician (Urology) Dr. Allyson Sabal, Derm  Medication Review Current Outpatient Prescriptions on File Prior to Visit  Medication Sig Dispense Refill  . Azelastine-Fluticasone 137-50 MCG/ACT SUSP Place 1-2 puffs into the nose at bedtime.  1 Bottle  6  . Biotin 1000 MCG tablet Take 500 mcg by mouth daily.        . Calcium Carbonate-Vitamin D (CALCIUM 600+D HIGH POTENCY) 600-400 MG-UNIT per tablet Take 1 tablet by mouth daily.        . cholecalciferol (VITAMIN D) 1000 UNITS tablet Take 1,000 Units by mouth daily.       . clotrimazole-betamethasone (LOTRISONE) cream Apply 1 application topically 2 (two) times daily.  45 g  2  . diazepam (VALIUM) 5 MG tablet Take 5 mg by mouth every 8 (eight) hours as needed for anxiety.      . dicyclomine (BENTYL) 10 MG capsule Take 1 capsule (10 mg total) by mouth 4 (four) times daily -  before meals and at bedtime.  30 capsule  1  . levothyroxine (SYNTHROID, LEVOTHROID) 75 MCG tablet Take 75 mcg by mouth daily.        . meloxicam (MOBIC) 15 MG tablet Take 15 mg by mouth daily.      Marland Kitchen nystatin cream (MYCOSTATIN) Apply 1 application topically 2 (two) times daily.  30 g  2  . Potassium Gluconate 595 MG CAPS Take 595 mg by mouth daily.        . simvastatin (ZOCOR) 20 MG tablet Take 20 mg by mouth daily.        . vitamin C (ASCORBIC ACID) 500 MG tablet Take 500 mg by mouth daily.        . vitamin E (VITAMIN E) 400 UNIT capsule Take 400 Units by mouth daily.         No current facility-administered medications on file prior to visit.    Current Problems (verified) Patient Active Problem List   Diagnosis Date Noted  . Hypercholesteremia   . Diarrhea 08/04/2013  . Rhinitis, allergic 08/19/2011  . Family  history of malignant neoplasm of gastrointestinal tract 05/06/2011  . Diverticulosis 04/23/2011  . Colon polyps 04/23/2011  . PERSONAL HISTORY OF MALIGNANT NEOPLASM OF BREAST 12/07/2007  . HYPOTHYROIDISM 12/05/2007  . DEGENERATIVE JOINT DISEASE 12/05/2007  . HEMORRHOIDS, INTERNAL 11/08/2002    Screening Tests Health Maintenance  Topic Date Due  . Zostavax  08/11/1987  . Influenza Vaccine  09/30/2013  . Colonoscopy  01/11/2018  . Tetanus/tdap  10/18/2022  . Pneumococcal Polysaccharide Vaccine Age 1 And Over  Completed    Immunization History  Administered Date(s) Administered  . Influenza Split 12/01/2010  . Influenza-Unspecified 10/31/2012  . Pneumococcal Polysaccharide-23 03/20/2010  . Td 10/17/2012    Preventative care: Last colonoscopy: 2009 getting it Nov  Last mammogram: 04/2013 3D normal, due 1 year Last pap smear/pelvic exam: 2011   DEXA:2011 DUE  Prior vaccinations: TD or Tdap: 2014  Influenza: 2014  Pneumococcal: 2012  Prevnar 13: DUE Shingles/Zostavax: check price  History reviewed: allergies, current medications, past family history, past medical history, past social history, past surgical history and problem  list  Risk Factors: Osteoporosis: postmenopausal estrogen deficiency and dietary calcium and/or vitamin D deficiency History of fracture in the past year: no  Tobacco History  Substance Use Topics  . Smoking status: Never Smoker   . Smokeless tobacco: Never Used  . Alcohol Use: Yes     Comment: ocassionally   She does not smoke.  Patient is not a former smoker. Are there smokers in your home (other than you)?  No  Alcohol Current alcohol use: social drinker  Caffeine Current caffeine use: coffee 1 /day  Exercise  Current exercise: no regular exercise, walking and yard work  Nutrition/Diet Current diet: in general, a "healthy" diet    Cardiac risk factors: advanced age (older than 65 for men, 59 for women), dyslipidemia,  hypertension and sedentary lifestyle.  Depression Screen (Note: if answer to either of the following is "Yes", a more complete depression screening is indicated)   Q1: Over the past two weeks, have you felt down, depressed or hopeless? No  Q2: Over the past two weeks, have you felt little interest or pleasure in doing things? No  Have you lost interest or pleasure in daily life? No  Do you often feel hopeless? No  Do you cry easily over simple problems? No  Activities of Daily Living In your present state of health, do you have any difficulty performing the following activities?:  Driving? No Managing money?  No Feeding yourself? No Getting from bed to chair? No Climbing a flight of stairs? No Preparing food and eating?: No Bathing or showering? No Getting dressed: No Getting to the toilet? No Using the toilet:No Moving around from place to place: No In the past year have you fallen or had a near fall?:No   Are you sexually active?  No  Do you have more than one partner?  No  Vision Difficulties: No  Hearing Difficulties: No Do you often ask people to speak up or repeat themselves? No Do you experience ringing or noises in your ears? Yes Do you have difficulty understanding soft or whispered voices? No  Cognition  Do you feel that you have a problem with memory?Yes  Do you often misplace items? No  Do you feel safe at home?  Yes  Advanced directives Does patient have a [place]? Yes Does patient have a Living Will? Yes   Objective:     Blood pressure 138/78, pulse 60, temperature 97.7 F (36.5 C), resp. rate 16, height 5\' 4"  (1.626 m), weight 145 lb (65.772 kg). Body mass index is 24.88 kg/(m^2).  Wt Readings from Last 3 Encounters:  10/18/13 145 lb (65.772 kg)  08/04/13 148 lb 3.2 oz (67.223 kg)  06/21/13 146 lb (66.225 kg)    General appearance: alert, no distress, WD/WN,  female Cognitive Testing  Alert? Yes  Normal  Appearance?Yes  Oriented to person? Yes  Place? Yes   Time? Yes  Recall of three objects?  2/3  Can perform simple calculations? Yes  Displays appropriate judgment?Yes  Can read the correct time from a watch face?Yes  HEENT: normocephalic, sclerae anicteric, TMs pearly, nares patent, no discharge or erythema, pharynx normal Oral cavity: MMM, no lesions Neck: supple, no lymphadenopathy, no thyromegaly, no masses Heart: RRR, normal S1, S2, no murmurs Lungs: CTA bilaterally, no wheezes, rhonchi, or rales Abdomen: +bs, soft, non tender, non distended, no masses, no hepatomegaly, no splenomegaly Musculoskeletal: nontender, no swelling, no obvious deformity Extremities: no edema, no cyanosis, no clubbing Pulses: 2+ symmetric,  upper and lower extremities, normal cap refill Neurological: alert, oriented x 3, CN2-12 intact, strength normal upper extremities and lower extremities, sensation normal throughout, DTRs 2+ throughout, no cerebellar signs, gait normal Psychiatric: normal affect, behavior normal, pleasant  Breast:  Gyn: defer  Rectal: defer  Medicare Attestation I have personally reviewed: The patient's medical and social history Their use of alcohol, tobacco or illicit drugs Their current medications and supplements The patient's functional ability including ADLs,fall risks, home safety risks, cognitive, and hearing and visual impairment Diet and physical activities Evidence for depression or mood disorders  The patient's weight, height, BMI, and visual acuity have been recorded in the chart.  I have made referrals, counseling, and provided education to the patient based on review of the above and I have provided the patient with a written personalized care plan for preventive services.     Vicie Mutters, PA-C   10/18/2013

## 2013-10-19 ENCOUNTER — Other Ambulatory Visit: Payer: Self-pay

## 2013-10-19 ENCOUNTER — Other Ambulatory Visit: Payer: Self-pay | Admitting: Physician Assistant

## 2013-10-19 LAB — URINALYSIS, ROUTINE W REFLEX MICROSCOPIC
Bilirubin Urine: NEGATIVE
Glucose, UA: NEGATIVE mg/dL
HGB URINE DIPSTICK: NEGATIVE
Ketones, ur: NEGATIVE mg/dL
LEUKOCYTES UA: NEGATIVE
Nitrite: NEGATIVE
PH: 5 (ref 5.0–8.0)
PROTEIN: NEGATIVE mg/dL
Specific Gravity, Urine: 1.021 (ref 1.005–1.030)
Urobilinogen, UA: 0.2 mg/dL (ref 0.0–1.0)

## 2013-10-19 LAB — MICROALBUMIN / CREATININE URINE RATIO
Creatinine, Urine: 210 mg/dL
MICROALB UR: 1.02 mg/dL (ref 0.00–1.89)
Microalb Creat Ratio: 4.9 mg/g (ref 0.0–30.0)

## 2013-10-19 LAB — VITAMIN D 25 HYDROXY (VIT D DEFICIENCY, FRACTURES): Vit D, 25-Hydroxy: 47 ng/mL (ref 30–89)

## 2013-10-19 MED ORDER — CLOTRIMAZOLE-BETAMETHASONE 1-0.05 % EX CREA: 1.0000 "application " | TOPICAL_CREAM | Freq: Two times a day (BID) | CUTANEOUS | Status: DC

## 2013-11-27 ENCOUNTER — Ambulatory Visit (INDEPENDENT_AMBULATORY_CARE_PROVIDER_SITE_OTHER): Payer: Medicare Other

## 2013-11-27 DIAGNOSIS — E039 Hypothyroidism, unspecified: Secondary | ICD-10-CM

## 2013-11-27 DIAGNOSIS — R6889 Other general symptoms and signs: Secondary | ICD-10-CM

## 2013-11-27 NOTE — Progress Notes (Signed)
[name], female   DOB: [birth year], 80 y.o.   MRN: [id number] Patient here today for recheck of thyroid. Patient taking Levothyroxine 150 mg, Tuesday and Thursday , taking 75 mg all other days.

## 2013-11-28 LAB — TSH: TSH: 0.452 u[IU]/mL (ref 0.350–4.500)

## 2013-12-04 ENCOUNTER — Telehealth: Payer: Self-pay

## 2013-12-04 NOTE — Telephone Encounter (Signed)
Return call to patient, she called because she has not heard from [employer] regarding the cologuard product, I called [employer] and was informed by rep that the request was cancelled by provider due to it was not covered. I called patient and informed her and she was curious about cost, she was given the phone number to [employer] to follow up on cost at [phone]

## 2014-01-01 ENCOUNTER — Encounter: Payer: Self-pay | Admitting: Physician Assistant

## 2014-01-01 ENCOUNTER — Ambulatory Visit (INDEPENDENT_AMBULATORY_CARE_PROVIDER_SITE_OTHER): Payer: Medicare Other | Admitting: Physician Assistant

## 2014-01-01 VITALS — BP 150/76 | HR 64 | Temp 98.2°F | Resp 18 | Ht 64.25 in | Wt 142.0 lb

## 2014-01-01 DIAGNOSIS — A499 Bacterial infection, unspecified: Secondary | ICD-10-CM

## 2014-01-01 DIAGNOSIS — L089 Local infection of the skin and subcutaneous tissue, unspecified: Secondary | ICD-10-CM

## 2014-01-01 DIAGNOSIS — B9689 Other specified bacterial agents as the cause of diseases classified elsewhere: Secondary | ICD-10-CM

## 2014-01-01 MED ORDER — DOXYCYCLINE HYCLATE 100 MG PO TABS: 100.0000 mg | ORAL_TABLET | Freq: Two times a day (BID) | ORAL | Status: DC

## 2014-01-01 NOTE — Progress Notes (Signed)
Subjective:    [name], female    DOB: 06/19/1925, 80 y.o.   MRN: [id number]  HPI A 80yo Caucasian female presents today due to a cut on her dorsal right index finger at the MP joint that is about 4cm long and 0.2cm wide.  She states the cut happened 2-3 weeks ago when she was at her daughter's house in [place].  Patient states she was sitting on a stool at the kitchen counter and when she got up her foot slipped on the shiny wooden floors.  She reports that she hit her head on the stool when she landed, but did not loose consciousness.  She did not realize her hand was cut until her granddaughter told her she was bleeding on her right hand.  Her daughters told her to go to the ER to get stitches, but pt refused due to not wanting to go through that and was upset.  Patient stated she "felt like she already went through trauma".  Patient stated the pharmacist told her to put Bacitracin on it and covered it with gauze pads.  Patient state that pus was oozing from the wound yesterday.  Patient is left-hand dominant.  She states the cut is not healing well in her eyes.  She denies pain right now, but states is was painful when it happened.  Patient denies fever, chills, night sweats, fatigue, CP, SOB, abdominal pain, nausea, vomiting, diarrhea, constipation, decreased ROM, numbness, tingling, anxiety and stress.  Review of Systems  Constitutional: Negative.   Musculoskeletal: Negative.  Negative for myalgias, joint swelling and arthralgias.  Skin: Positive for wound. Negative for color change, pallor and rash.  Neurological: Negative.   Psychiatric/Behavioral: Negative.   All other systems reviewed and are negative. - See HPI for ROS.   Past Medical History  Diagnosis Date  . Hypothyroidism   . Internal hemorrhoids without mention of complication   . Family history of malignant neoplasm of gastrointestinal tract 05/06/2011  . Wears glasses   . Wears dentures     top  . Colon  polyp 2009    BENIGN POLYPOID  . Diverticulosis of colon (without mention of hemorrhage) 2009  . Hypercholesteremia   . DJD (degenerative joint disease)   . Breast cancer     left   Current Outpatient Prescriptions on File Prior to Visit  Medication Sig Dispense Refill  . Azelastine-Fluticasone 137-50 MCG/ACT SUSP Place 1-2 puffs into the nose at bedtime. 1 Bottle 6  . Biotin 1000 MCG tablet Take 500 mcg by mouth daily.      . Calcium Carbonate-Vitamin D (CALCIUM 600+D HIGH POTENCY) 600-400 MG-UNIT per tablet Take 1 tablet by mouth daily.      . cholecalciferol (VITAMIN D) 1000 UNITS tablet Take 1,000 Units by mouth daily.     . clotrimazole-betamethasone (LOTRISONE) cream Apply 1 application topically 2 (two) times daily. 45 g 2  . diazepam (VALIUM) 5 MG tablet Take 1 tablet (5 mg total) by mouth every 8 (eight) hours as needed for anxiety. 90 tablet 0  . levothyroxine (SYNTHROID, LEVOTHROID) 150 MCG tablet Take 1 tablet (150 mcg total) by mouth daily. 90 tablet 0  . meloxicam (MOBIC) 15 MG tablet Take 15 mg by mouth daily.    . Multiple Vitamin (ONE-A-DAY 55 PLUS PO) Take by mouth daily.    . Potassium Gluconate 595 MG CAPS Take 595 mg by mouth daily.      . simvastatin (ZOCOR) 20 MG tablet Take  20 mg by mouth daily.      . vitamin C (ASCORBIC ACID) 500 MG tablet Take 500 mg by mouth daily.      . vitamin E (VITAMIN E) 400 UNIT capsule Take 400 Units by mouth daily.       No current facility-administered medications on file prior to visit.   Allergies  Allergen Reactions  . Ciprofloxacin   . Epinephrine Other (See Comments)    Heart raced   . Etodolac Hives  . Lidocaine   . Synephrine     Palpitations   . Flagyl [Metronidazole Hcl] Rash     BP 150/76 mmHg  Pulse 64  Temp(Src) 98.2 F (36.8 C) (Temporal)  Resp 18  Ht 5' 4.25" (1.632 m)  Wt 142 lb (64.411 kg)  BMI 24.18 kg/m2  SpO2 97% Objective:   Physical Exam  Constitutional: She is oriented to person, place, and  time. She appears well-developed and well-nourished. No distress.  HENT:  Head: Normocephalic and atraumatic.  Eyes: Conjunctivae are normal. Pupils are equal, round, and reactive to light. Right eye exhibits no discharge. Left eye exhibits no discharge. No scleral icterus.  Neck: Normal range of motion.  Cardiovascular: Normal rate, regular rhythm, S1 normal, S2 normal, normal heart sounds, intact distal pulses and normal pulses.  Exam reveals no gallop, no distant heart sounds and no friction rub.   No murmur heard. Pulmonary/Chest: Effort normal and breath sounds normal. No respiratory distress. She has no decreased breath sounds. She has no wheezes. She has no rhonchi. She has no rales.  Musculoskeletal: Normal range of motion. She exhibits tenderness. She exhibits no edema.       Right wrist: She exhibits laceration. She exhibits normal range of motion, no tenderness, no bony tenderness, no swelling, no effusion, no crepitus and no deformity.       Arms: Tenderness upon palpation to right index MP joint.  Neurological: She is alert and oriented to person, place, and time. She has normal strength. No sensory deficit.  Skin: Skin is warm and dry. No rash noted. She is not diaphoretic. No cyanosis or erythema. No pallor. Nails show no clubbing.  Psychiatric: She has a normal mood and affect. Her speech is normal and behavior is normal. Judgment and thought content normal. Cognition and memory are normal.  Vitals reviewed.     Assessment & Plan:  1. Bacterial skin infection Take Doxycycline as prescribed with food.- doxycycline (VIBRA-TABS) 100 MG tablet; Take 1 tablet (100 mg total) by mouth 2 (two) times daily. For 10 days  Dispense: 20 tablet; Refill: 0 -Wash it daily with soap and water and keep uncovered so it can stay dry. -If you are having redness, swelling, joint pain or it is getting worse, then please call the office and we will have to refer you to ortho to make sure you do not  have a joint infection. -If you are having fever, chills, night sweats, redness, swelling, pus drainage and joint pain, then please go to the ER immediately.    Parisa Pinela, Stephani Police, PA-C 3:58 PM [facility]

## 2014-01-01 NOTE — Patient Instructions (Addendum)
Take Doxycycline as prescribed.  Make sure to take with food.   Keep clean and use soap and water to clean and keep dry.   If you have any questions or concerns, then please call the office.   -If you are having redness, swelling, joint pain or it is getting worse, then please call the office and we will have to refer you to ortho to make sure you do not have a joint infection. -If you are having fever, chills, night sweats, redness, swelling, pus drainage and joint pain, then please go to the ER immediately.

## 2014-01-26 ENCOUNTER — Other Ambulatory Visit: Payer: Self-pay | Admitting: Physician Assistant

## 2014-02-14 ENCOUNTER — Ambulatory Visit (INDEPENDENT_AMBULATORY_CARE_PROVIDER_SITE_OTHER): Payer: Medicare Other | Admitting: Internal Medicine

## 2014-02-14 ENCOUNTER — Encounter: Payer: Self-pay | Admitting: Internal Medicine

## 2014-02-14 VITALS — BP 122/66 | HR 56 | Temp 97.3°F | Resp 16 | Ht 64.5 in | Wt 142.2 lb

## 2014-02-14 DIAGNOSIS — E039 Hypothyroidism, unspecified: Secondary | ICD-10-CM

## 2014-02-14 DIAGNOSIS — E559 Vitamin D deficiency, unspecified: Secondary | ICD-10-CM

## 2014-02-14 DIAGNOSIS — IMO0001 Reserved for inherently not codable concepts without codable children: Secondary | ICD-10-CM

## 2014-02-14 DIAGNOSIS — Z79899 Other long term (current) drug therapy: Secondary | ICD-10-CM

## 2014-02-14 DIAGNOSIS — R030 Elevated blood-pressure reading, without diagnosis of hypertension: Secondary | ICD-10-CM

## 2014-02-14 DIAGNOSIS — E782 Mixed hyperlipidemia: Secondary | ICD-10-CM | POA: Insufficient documentation

## 2014-02-14 DIAGNOSIS — R0989 Other specified symptoms and signs involving the circulatory and respiratory systems: Secondary | ICD-10-CM | POA: Insufficient documentation

## 2014-02-14 LAB — CBC WITH DIFFERENTIAL/PLATELET
Basophils Absolute: 0 10*3/uL (ref 0.0–0.1)
Basophils Relative: 0 % (ref 0–1)
EOS ABS: 0.2 10*3/uL (ref 0.0–0.7)
Eosinophils Relative: 3 % (ref 0–5)
HCT: 37.6 % (ref 36.0–46.0)
HEMOGLOBIN: 13 g/dL (ref 12.0–15.0)
LYMPHS ABS: 1.7 10*3/uL (ref 0.7–4.0)
LYMPHS PCT: 28 % (ref 12–46)
MCH: 29.5 pg (ref 26.0–34.0)
MCHC: 34.6 g/dL (ref 30.0–36.0)
MCV: 85.3 fL (ref 78.0–100.0)
MONO ABS: 0.5 10*3/uL (ref 0.1–1.0)
MONOS PCT: 9 % (ref 3–12)
MPV: 9.4 fL (ref 9.4–12.4)
Neutro Abs: 3.5 10*3/uL (ref 1.7–7.7)
Neutrophils Relative %: 60 % (ref 43–77)
Platelets: 252 10*3/uL (ref 150–400)
RBC: 4.41 MIL/uL (ref 3.87–5.11)
RDW: 13.7 % (ref 11.5–15.5)
WBC: 5.9 10*3/uL (ref 4.0–10.5)

## 2014-02-14 NOTE — Progress Notes (Signed)
[name], female   DOB: 04-11-1925, 80 y.o.   MRN: [id number]   This very nice 80 y.o.female presents for 3 month follow up with Hypertension, Hyperlipidemia, Pre-Diabetes and Vitamin D Deficiency.    Patient is treated for HTN & BP has been controlled at home. Today's BP: 122/66 mmHg. Patient has had no complaints of any cardiac type chest pain, palpitations, dyspnea/orthopnea/PND, dizziness, claudication, or dependent edema.   Hyperlipidemia is controlled with diet & meds. Patient denies myalgias or other med SE's. Last Lipids were Total Chol 167; HDL  57; LDL 81; Triglycerides 143 on 10/18/2013   Also, the patient is screened for PreDiabetes and has had no symptoms of reactive hypoglycemia, diabetic polys, paresthesias or visual blurring.  Last A1c was 5.5% in Feb 2014.   Further, the patient also has history of Vitamin D Deficiency and supplements vitamin D without any suspected side-effects. Last vitamin D was  47 on 10/18/2013.    Medication List   Biotin 1000 MCG tablet  Take 500 mcg by mouth daily.     CALCIUM 600+D HIGH POTENCY 600-400 MG-UNIT per tablet  Generic drug:  Calcium Carbonate-Vitamin D  Take 1 tablet by mouth daily.     cholecalciferol 1000 UNITS tablet  Commonly known as:  VITAMIN D  Take 1,000 Units by mouth daily.     clotrimazole-betamethasone cream  Commonly known as:  LOTRISONE  Apply 1 application topically 2 (two) times daily.     diazepam 5 MG tablet  Commonly known as:  VALIUM  Take 1 tablet (5 mg total) by mouth every 8 (eight) hours as needed for anxiety.     levothyroxine 150 MCG tablet  Commonly known as:  SYNTHROID, LEVOTHROID  Take 1 tablet (150 mcg total) by mouth daily.     ONE-A-DAY 55 PLUS PO  Take by mouth daily.     Potassium Gluconate 595 MG Caps  Take 595 mg by mouth daily.     simvastatin 20 MG tablet  Commonly known as:  ZOCOR  Take 1 tablet by mouth  every night at bedtime     vitamin C 500 MG tablet  Commonly  known as:  ASCORBIC ACID  Take 500 mg by mouth daily.     vitamin E 400 UNIT capsule  Generic drug:  vitamin E  Take 400 Units by mouth daily.     Allergies  Allergen Reactions  . Ciprofloxacin   . Epinephrine Other (See Comments)    Heart raced   . Etodolac Hives  . Lidocaine   . Synephrine     Palpitations   . Flagyl [Metronidazole Hcl] Rash    PMHx:   Past Medical History  Diagnosis Date  . Hypothyroidism   . Internal hemorrhoids without mention of complication   . Family history of malignant neoplasm of gastrointestinal tract 05/06/2011  . Wears glasses   . Wears dentures     top  . Colon polyp 2009    BENIGN POLYPOID  . Diverticulosis of colon (without mention of hemorrhage) 2009  . Hypercholesteremia   . DJD (degenerative joint disease)   . Breast cancer     left   Immunization History  Administered Date(s) Administered  . Influenza Split 12/01/2010  . Influenza-Unspecified 10/31/2012  . Pneumococcal Conjugate-13 10/18/2013  . Pneumococcal Polysaccharide-23 03/20/2010  . Td 10/17/2012   Past Surgical History  Procedure Laterality Date  . Abdominal hysterectomy    . Bladder tac    . Bunionectomy  bilateral  . Right shoulder  2003    bone spur removed-rcr  . Wrist fracture surgery  2003    left-lipoma  . Breast lumpectomy  2004    left lump-snbx  . Cystocele repair  2009  . Tonsillectomy    . Knee arthroscopy Left 01/17/2013    Procedure: ARTHROSCOPY LEFT KNEE;  Surgeon: Hessie Dibble, MD;  Location: [facility];  Service: Orthopedics;  Laterality: Left;  partial medial and partial lateral chondroplasty and removal loose body  . Colonoscopy  2009    several   FHx:    Reviewed / unchanged  SHx:    Reviewed / unchanged  Systems Review:  Constitutional: Denies fever, chills, wt changes, headaches, insomnia, fatigue, night sweats, change in appetite. Eyes: Denies redness, blurred vision, diplopia, discharge, itchy, watery eyes.   ENT: Denies discharge, congestion, post nasal drip, epistaxis, sore throat, earache, hearing loss, dental pain, tinnitus, vertigo, sinus pain, snoring.  CV: Denies chest pain, palpitations, irregular heartbeat, syncope, dyspnea, diaphoresis, orthopnea, PND, claudication or edema. Respiratory: denies cough, dyspnea, DOE, pleurisy, hoarseness, laryngitis, wheezing.  Gastrointestinal: Denies dysphagia, odynophagia, heartburn, reflux, water brash, abdominal pain or cramps, nausea, vomiting, bloating, diarrhea, constipation, hematemesis, melena, hematochezia  or hemorrhoids. Genitourinary: Denies dysuria, frequency, urgency, nocturia, hesitancy, discharge, hematuria or flank pain. Musculoskeletal: Denies arthralgias, myalgias, stiffness, jt. swelling, pain, limping or strain/sprain.  Skin: Denies pruritus, rash, hives, warts, acne, eczema or change in skin lesion(s). Neuro: No weakness, tremor, incoordination, spasms, paresthesia or pain. Psychiatric: Denies confusion, memory loss or sensory loss. Endo: Denies change in weight, skin or hair change.  Heme/Lymph: No excessive bleeding, bruising or enlarged lymph nodes.  Physical Exam  BP 122/66   Pulse 56  Temp 97.3 F   Resp 16  Ht 5' 4.5"   Wt 142 lb 3.2 oz      BMI 24.04   Appears well nourished and in no distress. Eyes: PERRLA, EOMs, conjunctiva no swelling or erythema. Sinuses: No frontal/maxillary tenderness ENT/Mouth: EAC's clear, TM's nl w/o erythema, bulging. Nares clear w/o erythema, swelling, exudates. Oropharynx clear without erythema or exudates. Oral hygiene is good. Tongue normal, non obstructing. Hearing intact.  Neck: Supple. Thyroid nl. Car 2+/2+ without bruits, nodes or JVD. Chest: Respirations nl with BS clear & equal w/o rales, rhonchi, wheezing or stridor.  Cor: Heart sounds normal w/ regular rate and rhythm without sig. murmurs, gallops, clicks, or rubs. Peripheral pulses normal and equal  without edema.  Abdomen: Soft  & bowel sounds normal. Non-tender w/o guarding, rebound, hernias, masses, or organomegaly.  Lymphatics: Unremarkable.  Musculoskeletal: Full ROM all peripheral extremities, joint stability, 5/5 strength, and normal gait.  Skin: Warm, dry without exposed rashes, lesions or ecchymosis apparent.  Neuro: Cranial nerves intact, reflexes equal bilaterally. Sensory-motor testing grossly intact. Tendon reflexes grossly intact.  Pysch: Alert & oriented x 3.  Insight and judgement nl & appropriate. No ideations.  Assessment and Plan:  1. Elevated BP - Continue monitor blood pressure at home. Continue diet/meds same.  2. Hyperlipidemia - Continue diet/meds, exercise,& lifestyle modifications. Continue monitor periodic cholesterol/liver & renal functions   3.  Pre-Diabetes - Continue diet, exercise, lifestyle modifications. Monitor appropriate labs.  4. Vitamin D Deficiency - Continue supplementation.  5. Hypothyroidism -    Recommended regular exercise, BP monitoring, weight control, and discussed med and SE's. Recommended labs to assess and monitor clinical status. Further disposition pending results of labs.

## 2014-02-14 NOTE — Patient Instructions (Signed)

## 2014-02-15 LAB — HEPATIC FUNCTION PANEL
ALBUMIN: 4.1 g/dL (ref 3.5–5.2)
ALK PHOS: 66 U/L (ref 39–117)
ALT: 14 U/L (ref 0–35)
AST: 18 U/L (ref 0–37)
BILIRUBIN TOTAL: 1.1 mg/dL (ref 0.2–1.2)
Bilirubin, Direct: 0.2 mg/dL (ref 0.0–0.3)
Indirect Bilirubin: 0.9 mg/dL (ref 0.2–1.2)
TOTAL PROTEIN: 6.2 g/dL (ref 6.0–8.3)

## 2014-02-15 LAB — TSH: TSH: 1.63 u[IU]/mL (ref 0.350–4.500)

## 2014-02-15 LAB — BASIC METABOLIC PANEL WITHOUT GFR
BUN: 12 mg/dL (ref 6–23)
CO2: 27 meq/L (ref 19–32)
Calcium: 9.7 mg/dL (ref 8.4–10.5)
Chloride: 105 meq/L (ref 96–112)
Creat: 0.75 mg/dL (ref 0.50–1.10)
GFR, Est African American: 83 mL/min
GFR, Est Non African American: 72 mL/min
Glucose, Bld: 94 mg/dL (ref 70–99)
Potassium: 4.3 meq/L (ref 3.5–5.3)
Sodium: 143 meq/L (ref 135–145)

## 2014-02-15 LAB — HEMOGLOBIN A1C
Hgb A1c MFr Bld: 5.5 % (ref <5.7)
Mean Plasma Glucose: 111 mg/dL (ref <117)

## 2014-02-15 LAB — LIPID PANEL
CHOLESTEROL: 149 mg/dL (ref 0–200)
HDL: 60 mg/dL (ref >39)
LDL Cholesterol: 68 mg/dL (ref 0–99)
Total CHOL/HDL Ratio: 2.5 Ratio
Triglycerides: 103 mg/dL (ref <150)
VLDL: 21 mg/dL (ref 0–40)

## 2014-02-15 LAB — INSULIN, FASTING: Insulin fasting, serum: 13.3 u[IU]/mL (ref 2.0–19.6)

## 2014-02-15 LAB — MAGNESIUM: Magnesium: 2 mg/dL (ref 1.5–2.5)

## 2014-02-15 LAB — VITAMIN D 25 HYDROXY (VIT D DEFICIENCY, FRACTURES): Vit D, 25-Hydroxy: 29 ng/mL — ABNORMAL LOW (ref 30–100)

## 2014-02-21 ENCOUNTER — Telehealth: Payer: Self-pay | Admitting: *Deleted

## 2014-02-21 NOTE — Telephone Encounter (Signed)
Pt aware of lab results Patient advised to increase Vitamin D to 5000 iu daily.

## 2014-03-05 ENCOUNTER — Other Ambulatory Visit: Payer: Self-pay

## 2014-03-05 DIAGNOSIS — Z1231 Encounter for screening mammogram for malignant neoplasm of breast: Secondary | ICD-10-CM

## 2014-04-04 ENCOUNTER — Other Ambulatory Visit: Payer: Self-pay | Admitting: *Deleted

## 2014-04-04 MED ORDER — LEVOTHYROXINE SODIUM 150 MCG PO TABS: 150.0000 ug | ORAL_TABLET | Freq: Every day | ORAL | Status: DC

## 2014-04-30 ENCOUNTER — Ambulatory Visit (INDEPENDENT_AMBULATORY_CARE_PROVIDER_SITE_OTHER): Payer: Medicare Other | Admitting: Physician Assistant

## 2014-04-30 ENCOUNTER — Encounter: Payer: Self-pay | Admitting: Physician Assistant

## 2014-04-30 VITALS — BP 158/82 | HR 60 | Temp 98.4°F | Resp 16 | Ht 64.5 in | Wt 140.0 lb

## 2014-04-30 DIAGNOSIS — M79641 Pain in right hand: Secondary | ICD-10-CM

## 2014-04-30 DIAGNOSIS — M6530 Trigger finger, unspecified finger: Secondary | ICD-10-CM

## 2014-04-30 NOTE — Patient Instructions (Addendum)
We have made a referral for you to get imaging or go to another doctor. We will try to get this as soon as possible but please understand that we often need to get approval with your insurance before we can schedule and not every office can accommodate you quickly. So please allow 5-7 business days for [place] to call you back about the referral.    Trigger Finger Trigger finger (digital tendinitis and stenosing tenosynovitis) is a common disorder that causes an often painful catching of the fingers or thumb. It occurs as a clicking, snapping, or locking of a finger in the palm of the hand. This is caused by a problem with the tendons that flex or bend the fingers sliding smoothly through their sheaths. The condition may occur in any finger or a couple fingers at the same time.  The finger may lock with the finger curled or suddenly straighten out with a snap. This is more common in patients with rheumatoid arthritis and diabetes. Left untreated, the condition may get worse to the point where the finger becomes locked in flexion, like making a fist, or less commonly locked with the finger straightened out. CAUSES   Inflammation and scarring that lead to swelling around the tendon sheath.  Repeated or forceful movements.  Rheumatoid arthritis, an autoimmune disease that affects joints.  Gout.  Diabetes mellitus. SIGNS AND SYMPTOMS  Soreness and swelling of your finger.  A painful clicking or snapping as you bend and straighten your finger. DIAGNOSIS  Your health care provider will do a physical exam of your finger to diagnose trigger finger. TREATMENT   Splinting for 6-8 weeks may be helpful.  Nonsteroidal anti-inflammatory medicines (NSAIDs) can help to relieve the pain and inflammation.  Cortisone injections, along with splinting, may speed up recovery. Several injections may be required. Cortisone may give relief after one injection.  Surgery is another treatment that may be used if  conservative treatments do not work. Surgery can be minor, without incisions (a cut does not have to be made), and can be done with a needle through the skin.  Other surgical choices involve an open procedure in which the surgeon opens the hand through a small incision and cuts the pulley so the tendon can again slide smoothly. Your hand will still work fine. HOME CARE INSTRUCTIONS  Apply ice to the injured area, twice per day:  Put ice in a plastic bag.  Place a towel between your skin and the bag.  Leave the ice on for 20 minutes, 3-4 times a day.  Rest your hand often. MAKE SURE YOU:   Understand these instructions.  Will watch your condition.  Will get help right away if you are not doing well or get worse. Document Released: 12/07/2003 Document Revised: 10/19/2012 Document Reviewed: 07/19/2012 [facility] Patient Information 2015 [place], [place]. This information is not intended to replace advice given to you by your health care provider. Make sure you discuss any questions you have with your health care provider.  Dupuytren's Contracture Dupuytren's contracture affects the fingers and the palm of the hand. This condition usually develops slowly. It may take many years to develop. The pinky finger and the ring finger are most often affected. These fingers start to curve inward, like a claw. At some point, the fingers cannot go straight anymore. This can make it hard to do things like:  Put on gloves.  Shake hands.  Grab something off a shelf. The condition usually does not cause pain and  is not dangerous. The condition gets its name from the doctor who came up with an operation to fix the problem. His name was Lanney Gins Dupuytren. Contracture means pulling inward. CAUSES  Dupuytren's contracture does not start with the fingers. It starts in the palm of the hand, under the skin. The tissue under the skin is called fascia. The fascia covers the cords (tendons) that control  how the fingers move. In Dupuytren's contracture the fascia tissue becomes thick and then pulls on the cords. That causes the fingers to curl. The condition can affect both hands and any fingers, but it usually strikes one hand worse than the other. The fingers farthest from the thumb are most often the ones that curl. The cause is not clear. Some experts believe it results from an autoimmune reaction. That means the body's immune system (which fights off disease) attacks itself by mistake. What experts do know is that certain conditions and behaviors (called risk factors) make the chance of having this condition more likely. They include:  Age. Most people who have the condition are older than 50.  Sex. It affects men more often than women.  Family history. The condition tends to run in families from countries in [place] and [place].  Certain behaviors. People who smoke and drink alcohol are more apt to develop the problem.  Some other medical conditions. Having diabetes makes Dupuytren's contracture more likely. So does having a condition that involves a seizure (when the brain's function is interrupted). SYMPTOMS  Signs of this condition take time to develop. Sometimes this takes weeks or months. More often, it takes several years.   Early symptoms:  Skin on the palm of the hand becomes thick. This is usually the first sign.  The skin may look dimpled or puckered.  Lumps (nodules) show up on the palm. There may be one or more lumps. They are not painful.  Later symptoms:  Thick cords of tissue form in the palm of the hand.  The pinky and ring fingers start to curl up into the palm.  The fingers cannot be straightened into their normal position. DIAGNOSIS  A physical examination is the main way that a healthcare provider can tell if you have Dupuytren's contracture. Other tests usually are not needed. The caregiver will probably:  Look at your hands. Feel your hands.  This is to check for thickening and nodules.  Measure finger motion. This tells how much your fingers have contracted (pulled in).  Do a tabletop test. You will be asked to try to put your hand flat on a table, palm down. TREATMENT  There is no cure for Dupuytren's contracture. But there are ways to treat the symptoms. Options include:  Watching and waiting. The condition develops slowly. Often it does not create problems for a long time. Sometimes the skin gets thick and nodules form, but the fingers never curl. So, in some cases it is best to just watch the condition carefully and wait to see what happens.  Shots (injections). Different substances may be injected, including:  Steroids. These drugs block swelling. These shots should make the condition less uncomfortable. Steroids may also slow down the condition. Shots are given into the nodules. The effect only lasts awhile. More shots may have to be given.  Enzymes. These are proteins. They weaken the thick tissue. After an injection, the caregiver usually stretches the fingers.  Needling. A needle is pushed through the skin and into the thick tissue. This is done  in several spots. The goal is to break up the thickened tissue. Or to weaken it.  Surgery. This may be suggested if you cannot grasp objects. Or, if you can no longer put your hand in your pocket.  A cut (incision) is made in the palm of the hand. The thick tissue is removed.  Sometimes the thick tissue is attached to the skin. Then, the skin must be removed, too. It is replaced with a piece of skin from another place on your body. That is called a skin graft.  Occupational or hand therapy is almost always needed after surgery. This involves special exercises to get back the use of your hand and fingers. After a skin graft, several months of therapy may be needed.  Sometimes the condition comes back, even after surgery.  Other methods. You can do some things on your own. They  include:  Stretching the fingers backwards. Do this often.  Warming the hand and massaging it. Again, do this often.  Using tools with padded grips. This should make things easier.  Wearing heavy gloves while working. This protects the hands. PROGNOSIS  Dupuytren's contracture usually develops slowly. There is no cure. But, the symptoms can be treated. Sometimes they come back after treatment, but not always. It is important to remember that this is a functional problem and not a life-threatening condition. Document Released: 12/14/2008 Document Revised: 05/11/2011 Document Reviewed: 12/14/2008 [facility] Patient Information 2015 [place], [place]. This information is not intended to replace advice given to you by your health care provider. Make sure you discuss any questions you have with your health care provider.

## 2014-04-30 NOTE — Progress Notes (Signed)
Subjective:    [name], female    DOB: 1926-02-25, 81 y.o.   MRN: [id number]  HPI 81 y.o. left handed WF  has a past medical history of Hypothyroidism; Internal hemorrhoids without mention of complication; Family history of malignant neoplasm of gastrointestinal tract (05/06/2011); Wears glasses; Wears dentures; Colon polyp (2009); Diverticulosis of colon (without mention of hemorrhage) (2009); Hypercholesteremia; DJD (degenerative joint disease); and Breast cancer. presents with right hand pain. She has had right 3rd digit pain for over a year with a nodule on her PIP joint. It has gotten larger with pin point/sharp pain occ at the joint, and for the past month it has been catching and she will have to forcefully straighten it. She also complains of some tenderness on her palm. Advil occ for her knees.   Current Outpatient Prescriptions on File Prior to Visit  Medication Sig Dispense Refill  . Azelastine-Fluticasone 137-50 MCG/ACT SUSP Place 1-2 puffs into the nose at bedtime. 1 Bottle 6  . Biotin 1000 MCG tablet Take 500 mcg by mouth daily.      . Calcium Carbonate-Vitamin D (CALCIUM 600+D HIGH POTENCY) 600-400 MG-UNIT per tablet Take 1 tablet by mouth daily.      . cholecalciferol (VITAMIN D) 1000 UNITS tablet Take 2,000 Units by mouth 3 (three) times daily.     . clotrimazole-betamethasone (LOTRISONE) cream Apply 1 application topically 2 (two) times daily. 45 g 2  . diazepam (VALIUM) 5 MG tablet Take 1 tablet (5 mg total) by mouth every 8 (eight) hours as needed for anxiety. 90 tablet 0  . levothyroxine (SYNTHROID, LEVOTHROID) 150 MCG tablet Take 1 tablet (150 mcg total) by mouth daily. 90 tablet 1  . Multiple Vitamin (ONE-A-DAY 55 PLUS PO) Take by mouth daily.    . Potassium Gluconate 595 MG CAPS Take 595 mg by mouth daily.      . simvastatin (ZOCOR) 20 MG tablet Take 1 tablet by mouth  every night at bedtime 90 tablet 1  . vitamin C (ASCORBIC ACID) 500 MG tablet Take 500  mg by mouth daily.      . vitamin E (VITAMIN E) 400 UNIT capsule Take 400 Units by mouth daily.       No current facility-administered medications on file prior to visit.   Past Medical History  Diagnosis Date  . Hypothyroidism   . Internal hemorrhoids without mention of complication   . Family history of malignant neoplasm of gastrointestinal tract 05/06/2011  . Wears glasses   . Wears dentures     top  . Colon polyp 2009    BENIGN POLYPOID  . Diverticulosis of colon (without mention of hemorrhage) 2009  . Hypercholesteremia   . DJD (degenerative joint disease)   . Breast cancer     left    Review of Systems  Constitutional: Negative.   HENT: Negative.   Cardiovascular: Negative.   Musculoskeletal: Positive for myalgias, joint swelling and arthralgias. Negative for back pain, gait problem, neck pain and neck stiffness.  Skin: Negative.  Negative for rash.  Neurological: Negative.   Hematological: Negative.        Objective:   Physical Exam  Constitutional: She appears well-developed and well-nourished.  Cardiovascular: Regular rhythm.   Pulmonary/Chest: Effort normal and breath sounds normal.  Musculoskeletal:  Right+ PIP nodule 3rd digit hard, tender, without erythema, warmth, with catching of 3rd digit in flexion. Right palmar hand at 4th and 5th digit, hard fibrous cord with no obvious restriction/decreased  strength. Good distal neurovascular exam.        Assessment & Plan:  Right hand pain- + OA, ? Trigger finger vs dupytren's contracture- will refer to hand sepcialist and information given, continue NSAIDS  Future Appointments Date Time Provider [place]  05/14/2014 11:40 AM [facility]  05/16/2014 11:00 AM Vicie Mutters, PA-C [facility] None  10/22/2014 10:00 AM Vicie Mutters, PA-C [facility] None

## 2014-05-09 ENCOUNTER — Telehealth: Payer: Self-pay | Admitting: *Deleted

## 2014-05-09 NOTE — Telephone Encounter (Signed)
Pt asking about a referral said she was here & saw you & we were to refer her to someone & do the referral & schedule the appointment for trigger finger. Pt said shes checking status

## 2014-05-14 ENCOUNTER — Ambulatory Visit
Admission: RE | Admit: 2014-05-14 | Discharge: 2014-05-14 | Disposition: A | Discharge status: Home / Self Care | Payer: Medicare Other | Source: Ambulatory Visit

## 2014-05-14 ENCOUNTER — Ambulatory Visit: Payer: Self-pay

## 2014-05-14 DIAGNOSIS — Z1231 Encounter for screening mammogram for malignant neoplasm of breast: Secondary | ICD-10-CM

## 2014-05-16 ENCOUNTER — Ambulatory Visit (INDEPENDENT_AMBULATORY_CARE_PROVIDER_SITE_OTHER): Payer: Medicare Other | Admitting: Physician Assistant

## 2014-05-16 ENCOUNTER — Encounter: Payer: Self-pay | Admitting: Physician Assistant

## 2014-05-16 VITALS — BP 142/78 | HR 64 | Temp 97.7°F | Resp 16 | Wt 140.0 lb

## 2014-05-16 DIAGNOSIS — IMO0001 Reserved for inherently not codable concepts without codable children: Secondary | ICD-10-CM

## 2014-05-16 DIAGNOSIS — E782 Mixed hyperlipidemia: Secondary | ICD-10-CM | POA: Diagnosis not present

## 2014-05-16 DIAGNOSIS — Z79899 Other long term (current) drug therapy: Secondary | ICD-10-CM | POA: Diagnosis not present

## 2014-05-16 DIAGNOSIS — R030 Elevated blood-pressure reading, without diagnosis of hypertension: Secondary | ICD-10-CM

## 2014-05-16 DIAGNOSIS — E039 Hypothyroidism, unspecified: Secondary | ICD-10-CM | POA: Diagnosis not present

## 2014-05-16 DIAGNOSIS — E559 Vitamin D deficiency, unspecified: Secondary | ICD-10-CM

## 2014-05-16 LAB — LIPID PANEL
Cholesterol: 176 mg/dL (ref 0–200)
HDL: 64 mg/dL (ref >=46)
LDL CALC: 87 mg/dL (ref 0–99)
Total CHOL/HDL Ratio: 2.8 Ratio
Triglycerides: 124 mg/dL (ref <150)
VLDL: 25 mg/dL (ref 0–40)

## 2014-05-16 LAB — HEPATIC FUNCTION PANEL
ALT: 18 U/L (ref 0–35)
AST: 20 U/L (ref 0–37)
Albumin: 4.2 g/dL (ref 3.5–5.2)
Alkaline Phosphatase: 57 U/L (ref 39–117)
BILIRUBIN DIRECT: 0.2 mg/dL (ref 0.0–0.3)
Indirect Bilirubin: 0.8 mg/dL (ref 0.2–1.2)
Total Bilirubin: 1 mg/dL (ref 0.2–1.2)
Total Protein: 6.3 g/dL (ref 6.0–8.3)

## 2014-05-16 LAB — CBC WITH DIFFERENTIAL/PLATELET
Basophils Absolute: 0.1 10*3/uL (ref 0.0–0.1)
Basophils Relative: 1 % (ref 0–1)
EOS ABS: 0.1 10*3/uL (ref 0.0–0.7)
EOS PCT: 2 % (ref 0–5)
HCT: 40.5 % (ref 36.0–46.0)
HEMOGLOBIN: 13.6 g/dL (ref 12.0–15.0)
LYMPHS ABS: 1.5 10*3/uL (ref 0.7–4.0)
Lymphocytes Relative: 25 % (ref 12–46)
MCH: 29.6 pg (ref 26.0–34.0)
MCHC: 33.6 g/dL (ref 30.0–36.0)
MCV: 88.2 fL (ref 78.0–100.0)
MONO ABS: 0.5 10*3/uL (ref 0.1–1.0)
MPV: 9.5 fL (ref 8.6–12.4)
Monocytes Relative: 9 % (ref 3–12)
Neutro Abs: 3.8 10*3/uL (ref 1.7–7.7)
Neutrophils Relative %: 63 % (ref 43–77)
PLATELETS: 246 10*3/uL (ref 150–400)
RBC: 4.59 MIL/uL (ref 3.87–5.11)
RDW: 13.8 % (ref 11.5–15.5)
WBC: 6 10*3/uL (ref 4.0–10.5)

## 2014-05-16 LAB — BASIC METABOLIC PANEL WITH GFR
BUN: 18 mg/dL (ref 6–23)
CHLORIDE: 106 meq/L (ref 96–112)
CO2: 27 mEq/L (ref 19–32)
Calcium: 9.5 mg/dL (ref 8.4–10.5)
Creat: 0.92 mg/dL (ref 0.50–1.10)
GFR, Est African American: 65 mL/min
GFR, Est Non African American: 57 mL/min — ABNORMAL LOW
Glucose, Bld: 87 mg/dL (ref 70–99)
POTASSIUM: 4.6 meq/L (ref 3.5–5.3)
Sodium: 141 mEq/L (ref 135–145)

## 2014-05-16 LAB — TSH: TSH: 1.801 u[IU]/mL (ref 0.350–4.500)

## 2014-05-16 LAB — MAGNESIUM: MAGNESIUM: 2 mg/dL (ref 1.5–2.5)

## 2014-05-16 NOTE — Patient Instructions (Addendum)
Benefiber is good for constipation/diarrhea/irritable bowel syndrome, it helps with weight loss and can help lower your bad cholesterol. Please do 1-2 TBSP in the morning in water, coffee, or tea. It can take up to a month before you can see a difference with your bowel movements. It is cheapest from [employer], [employer], [employer].    02/14/2014: LDL (calc) 68. Your LDL is the bad cholesterol that can lead to heart attack and stroke. To lower your number you can decrease your fatty foods, red meat, cheese, milk and increase fiber like whole grains and veggies. You can also add a fiber supplement like Metamucil or Benefiber.   Nail fungus is very common and very difficult to treat. Sometime if caught early topical treatment can work. You can try terbinafine over the counter, apply twice daily for 1-3 months to your toenails. If this does not work there is a once a day oral medicine that you have to take for 3 months. Sometime this medication may have to be repeated. It is processed through your liver so we often ask patients to come back for a liver function test and to report any AB pain, yellowing of the skin, or fluid retention. If the topical treatment does not work for you we can start you on the pill.

## 2014-05-16 NOTE — Progress Notes (Signed)
Assessment and Plan:  1. Hypertension -Continue medication, monitor blood pressure at home. Continue DASH diet.  Reminder to go to the ER if any CP, SOB, nausea, dizziness, severe HA, changes vision/speech, left arm numbness and tingling and jaw pain.  2. Cholesterol -Continue diet and exercise. Check cholesterol.   3. Hypothyroidism -check TSH level, continue medications the same, reminded to take on an empty stomach 30-94mins before food.   4. Vitamin D Def - check level and continue medications.   Continue diet and meds as discussed. Further disposition pending results of labs.  HPI 81 y.o. female  presents for 3 month follow up   has a past medical history of Hypothyroidism; Internal hemorrhoids without mention of complication; Family history of malignant neoplasm of gastrointestinal tract (05/06/2011); Wears glasses; Wears dentures; Colon polyp (2009); Diverticulosis of colon (without mention of hemorrhage) (2009); Hypercholesteremia; DJD (degenerative joint disease); and Breast cancer.  Her blood pressure has been controlled at home, today their BP is BP: (!) 142/78 mmHg  She does workout. She denies chest pain, shortness of breath, dizziness.  She is on cholesterol medication and denies myalgias. Her cholesterol is at goal. The cholesterol last visit was:   Lab Results  Component Value Date   CHOL 149 02/14/2014   HDL 60 02/14/2014   LDLCALC 68 02/14/2014   TRIG 103 02/14/2014   CHOLHDL 2.5 02/14/2014  Last A1C in the office was:  Lab Results  Component Value Date   HGBA1C 5.5 02/14/2014  Patient is on Vitamin D supplement, 5000 IU.   Lab Results  Component Value Date   VD25OH 29* 02/14/2014    She is on thyroid medication. Her medication was not changed last visit.   Lab Results  Component Value Date   TSH 1.630 02/14/2014    Current Medications:  Current Outpatient Prescriptions on File Prior to Visit  Medication Sig Dispense Refill  . Azelastine-Fluticasone 137-50  MCG/ACT SUSP Place 1-2 puffs into the nose at bedtime. 1 Bottle 6  . Biotin 1000 MCG tablet Take 500 mcg by mouth daily.      . Calcium Carbonate-Vitamin D (CALCIUM 600+D HIGH POTENCY) 600-400 MG-UNIT per tablet Take 1 tablet by mouth daily.      . cholecalciferol (VITAMIN D) 1000 UNITS tablet Take 2,000 Units by mouth 3 (three) times daily.     . clotrimazole-betamethasone (LOTRISONE) cream Apply 1 application topically 2 (two) times daily. 45 g 2  . diazepam (VALIUM) 5 MG tablet Take 1 tablet (5 mg total) by mouth every 8 (eight) hours as needed for anxiety. 90 tablet 0  . levothyroxine (SYNTHROID, LEVOTHROID) 150 MCG tablet Take 1 tablet (150 mcg total) by mouth daily. 90 tablet 1  . Multiple Vitamin (ONE-A-DAY 55 PLUS PO) Take by mouth daily.    . Potassium Gluconate 595 MG CAPS Take 595 mg by mouth daily.      . simvastatin (ZOCOR) 20 MG tablet Take 1 tablet by mouth  every night at bedtime 90 tablet 1  . vitamin C (ASCORBIC ACID) 500 MG tablet Take 500 mg by mouth daily.      . vitamin E (VITAMIN E) 400 UNIT capsule Take 400 Units by mouth daily.       No current facility-administered medications on file prior to visit.   Medical History:  Past Medical History  Diagnosis Date  . Hypothyroidism   . Internal hemorrhoids without mention of complication   . Family history of malignant neoplasm of gastrointestinal tract 05/06/2011  .  Wears glasses   . Wears dentures     top  . Colon polyp 2009    BENIGN POLYPOID  . Diverticulosis of colon (without mention of hemorrhage) 2009  . Hypercholesteremia   . DJD (degenerative joint disease)   . Breast cancer     left   Allergies:  Allergies  Allergen Reactions  . Ciprofloxacin   . Epinephrine Other (See Comments)    Heart raced   . Etodolac Hives  . Lidocaine   . Synephrine     Palpitations   . Flagyl [Metronidazole Hcl] Rash     Review of Systems:  Review of Systems  Constitutional: Negative.   HENT: Negative.   Eyes:  Negative.   Respiratory: Negative.   Cardiovascular: Negative.   Gastrointestinal: Negative.   Genitourinary: Negative.   Musculoskeletal: Negative.   Skin: Negative.   Neurological: Negative.   Endo/Heme/Allergies: Negative.   Psychiatric/Behavioral: Negative.     Family history- Review and unchanged Social history- Review and unchanged Physical Exam: BP 142/78 mmHg  Pulse 64  Temp(Src) 97.7 F (36.5 C)  Resp 16  Wt 140 lb (63.504 kg) Wt Readings from Last 3 Encounters:  05/16/14 140 lb (63.504 kg)  04/30/14 140 lb (63.504 kg)  02/14/14 142 lb 3.2 oz (64.501 kg)   General Appearance: Well nourished, in no apparent distress. Eyes: PERRLA, EOMs, conjunctiva no swelling or erythema Sinuses: No Frontal/maxillary tenderness ENT/Mouth: Ext aud canals clear, TMs without erythema, bulging. No erythema, swelling, or exudate on post pharynx.  Tonsils not swollen or erythematous. Hearing normal.  Neck: Supple, thyroid normal.  Respiratory: Respiratory effort normal, BS equal bilaterally without rales, rhonchi, wheezing or stridor.  Cardio: RRR with no MRGs. Brisk peripheral pulses without edema.  Abdomen: Soft, + BS,  Non tender, no guarding, rebound, hernias, masses. Lymphatics: Non tender without lymphadenopathy.  Musculoskeletal: Full ROM, 5/5 strength, Normal gait Skin: Warm, dry without rashes, lesions, ecchymosis.  Neuro: Cranial nerves intact. Normal muscle tone, no cerebellar symptoms. Psych: Awake and oriented X 3, normal affect, Insight and Judgment appropriate.    Vicie Mutters, PA-C 11:19 AM [facility]

## 2014-05-17 DIAGNOSIS — M65331 Trigger finger, right middle finger: Secondary | ICD-10-CM | POA: Diagnosis not present

## 2014-05-17 LAB — VITAMIN D 25 HYDROXY (VIT D DEFICIENCY, FRACTURES): Vit D, 25-Hydroxy: 41 ng/mL (ref 30–100)

## 2014-05-23 ENCOUNTER — Telehealth: Payer: Self-pay | Admitting: Internal Medicine

## 2014-05-23 NOTE — Telephone Encounter (Signed)
please call with lab results  Thank you, [facility] [phone] Fax [phone]

## 2014-06-07 ENCOUNTER — Ambulatory Visit: Payer: Medicare Other

## 2014-06-07 DIAGNOSIS — R300 Dysuria: Secondary | ICD-10-CM | POA: Diagnosis not present

## 2014-06-07 DIAGNOSIS — R6889 Other general symptoms and signs: Secondary | ICD-10-CM

## 2014-06-07 LAB — BASIC METABOLIC PANEL WITH GFR
BUN: 16 mg/dL (ref 6–23)
CO2: 27 mEq/L (ref 19–32)
Calcium: 10 mg/dL (ref 8.4–10.5)
Chloride: 102 mEq/L (ref 96–112)
Creat: 1.04 mg/dL (ref 0.50–1.10)
GFR, EST AFRICAN AMERICAN: 56 mL/min — AB
GFR, EST NON AFRICAN AMERICAN: 49 mL/min — AB
Glucose, Bld: 79 mg/dL (ref 70–99)
POTASSIUM: 4.7 meq/L (ref 3.5–5.3)
SODIUM: 139 meq/L (ref 135–145)

## 2014-06-08 LAB — URINALYSIS, COMPLETE
BACTERIA UA: NONE SEEN
Bilirubin Urine: NEGATIVE
Casts: NONE SEEN
Crystals: NONE SEEN
Glucose, UA: NEGATIVE mg/dL
Hgb urine dipstick: NEGATIVE
KETONES UR: NEGATIVE mg/dL
LEUKOCYTES UA: NEGATIVE
NITRITE: NEGATIVE
PH: 5 (ref 5.0–8.0)
PROTEIN: NEGATIVE mg/dL
Specific Gravity, Urine: 1.01 (ref 1.005–1.030)
Squamous Epithelial / LPF: NONE SEEN
UROBILINOGEN UA: 0.2 mg/dL (ref 0.0–1.0)

## 2014-06-08 LAB — URINE CULTURE: Colony Count: 15000

## 2014-06-12 ENCOUNTER — Other Ambulatory Visit: Payer: Self-pay | Admitting: Internal Medicine

## 2014-06-12 ENCOUNTER — Telehealth: Payer: Self-pay

## 2014-06-12 DIAGNOSIS — R944 Abnormal results of kidney function studies: Secondary | ICD-10-CM

## 2014-06-12 NOTE — Telephone Encounter (Signed)
DISREGARD. WRONG PATIENT.ERROR

## 2014-06-12 NOTE — Telephone Encounter (Signed)
Return call to patient , she needed last lab results as she had a issue with mychart (now resolved) , patient aware of results and RX for antibiotic at pharmacy, she will return in one month for a UA and Culture in 30 days to recheck urine

## 2014-06-20 DIAGNOSIS — M65331 Trigger finger, right middle finger: Secondary | ICD-10-CM | POA: Diagnosis not present

## 2014-06-22 ENCOUNTER — Ambulatory Visit (HOSPITAL_COMMUNITY)
Admission: RE | Admit: 2014-06-22 | Discharge: 2014-06-22 | Disposition: A | Discharge status: Home / Self Care | Payer: Medicare Other | Source: Ambulatory Visit | Attending: Internal Medicine | Admitting: Internal Medicine

## 2014-06-22 DIAGNOSIS — N2889 Other specified disorders of kidney and ureter: Secondary | ICD-10-CM | POA: Insufficient documentation

## 2014-06-22 DIAGNOSIS — R944 Abnormal results of kidney function studies: Secondary | ICD-10-CM

## 2014-06-22 DIAGNOSIS — Z853 Personal history of malignant neoplasm of breast: Secondary | ICD-10-CM | POA: Insufficient documentation

## 2014-06-22 DIAGNOSIS — N281 Cyst of kidney, acquired: Secondary | ICD-10-CM | POA: Diagnosis not present

## 2014-07-06 ENCOUNTER — Ambulatory Visit: Payer: Self-pay | Admitting: Internal Medicine

## 2014-07-06 ENCOUNTER — Ambulatory Visit: Payer: Self-pay | Admitting: Physician Assistant

## 2014-07-11 ENCOUNTER — Encounter: Payer: Self-pay | Admitting: Internal Medicine

## 2014-07-11 ENCOUNTER — Ambulatory Visit (INDEPENDENT_AMBULATORY_CARE_PROVIDER_SITE_OTHER): Payer: Medicare Other | Admitting: Internal Medicine

## 2014-07-11 VITALS — BP 128/68 | HR 56 | Temp 97.7°F | Resp 16 | Ht 64.5 in | Wt 135.0 lb

## 2014-07-11 DIAGNOSIS — R944 Abnormal results of kidney function studies: Secondary | ICD-10-CM | POA: Diagnosis not present

## 2014-07-11 DIAGNOSIS — M25552 Pain in left hip: Secondary | ICD-10-CM

## 2014-07-11 DIAGNOSIS — IMO0001 Reserved for inherently not codable concepts without codable children: Secondary | ICD-10-CM

## 2014-07-11 DIAGNOSIS — M25562 Pain in left knee: Secondary | ICD-10-CM

## 2014-07-11 MED ORDER — PREDNISONE 5 MG PO TABS: ORAL_TABLET | ORAL | Status: AC

## 2014-07-11 NOTE — Progress Notes (Signed)
   Subjective:    [name], female    DOB: 1925/04/03, 80 y.o.   MRN: [id number]  HPI  Patient presents to the office for evaluation of decreased GFR.  She reports that since her last visit she has increased her water to about 64 oz daily and has completely stopped taking NSAIDs like advil which she was taking for her knee relatively frequently.  She has no other complaints other than her knee pain.  She was taking 200 mg once daily when she was taking it.    Review of Systems  Respiratory: Negative for chest tightness and shortness of breath.   Cardiovascular: Negative for chest pain and palpitations.  Gastrointestinal: Negative for nausea and vomiting.  Genitourinary: Negative for dysuria, urgency, frequency, hematuria, decreased urine volume and difficulty urinating.  Musculoskeletal: Positive for arthralgias (left knee painn).  Neurological: Negative for dizziness, weakness and light-headedness.       Objective:   Physical Exam  Constitutional: She is oriented to person, place, and time. She appears well-developed and well-nourished. No distress.  HENT:  Head: Normocephalic and atraumatic.  Mouth/Throat: Oropharynx is clear and moist. No oropharyngeal exudate.  Eyes: Conjunctivae and EOM are normal. Pupils are equal, round, and reactive to light. No scleral icterus.  Neck: Normal range of motion. Neck supple. No JVD present. No thyromegaly present.  Cardiovascular: Normal rate, regular rhythm, normal heart sounds and intact distal pulses.  Exam reveals no gallop and no friction rub.   No murmur heard. Pulmonary/Chest: Effort normal and breath sounds normal. No respiratory distress. She has no wheezes. She has no rales. She exhibits no tenderness.  Abdominal: Soft. Bowel sounds are normal. She exhibits no distension and no mass. There is no tenderness. There is no rebound and no guarding.  Musculoskeletal:       Left knee: She exhibits decreased range of motion and  bony tenderness. She exhibits no swelling, no ecchymosis, no deformity, no laceration, no erythema, normal alignment, no LCL laxity and normal patellar mobility. Tenderness found. Medial joint line and lateral joint line tenderness noted.  Lymphadenopathy:    She has no cervical adenopathy.  Neurological: She is alert and oriented to person, place, and time.  Skin: Skin is warm and dry. She is not diaphoretic.  Psychiatric: She has a normal mood and affect. Her behavior is normal. Judgment and thought content normal.  Nursing note and vitals reviewed.         Assessment & Plan:    1. Decreased GFR -likely due to NSAIDS - BASIC METABOLIC PANEL WITH GFR  2. Chronic arthralgias of left knees and hips  - predniSONE (DELTASONE) 5 MG tablet; Take 2  tablets by mouth with breakfast for 1 week.  Decrease to 1 tablet daily for 1 week.  Dispense: 30 tablet; Refill: 0

## 2014-07-11 NOTE — Patient Instructions (Signed)
GFR, EGFR This test is used for screening to look for evidence of kidney damage (EGFR) or for changes in kidney function if you already have kidney disease (GFR). EGFR is calculated from your age, weight, gender, and serum creatinine (requires a blood sample from a vein in your arm); in some formulas, race is also used in the calculation. GFR is calculated from serum and urine creatinine. It requires a blood sample from a vein in your arm and a 24-hour collection of urine. Glomerular filtration rate (GFR) is a measure of the function of your kidneys. Glomeruli are tiny filters in your kidney that allow waste products to be removed from the blood, while preventing loss of important proteins and blood cells. The rate refers to the amount of blood that is filtered per minute. Estimated glomerular filtration rate (EGFR) is an estimate of the actual GFR. GFR is considered the most accurate way to detect changes in kidney status. While measurement of serum BUN (urea nitrogen) and creatinine are easier to do, they cannot pick up early damage to the kidneys. If kidney damage is detected early, it may be possible to prevent worsening damage to the kidneys with treatment of high blood pressure, diabetes, or other diseases that can damage the kidney.  SAMPLE COLLECTION A blood sample is taken by needle from a vein in the arm; your weight and age are needed for EGFR as well. GFR requires both a blood sample taken from a vein in your arm and a 24-hour sample of urine.  NORMAL FINDINGS Your lab will provide a range of normal values with your test results. MEANING OF TEST  Your caregiver will go over your test results with you and discuss the importance of this test. Reference values are dependent on many factors, including patient age, gender, sample population, and testing method. Numeric test results have different meanings in different labs. Your lab report should include the specific reference range for your test.   EGFR can detect most types of kidney disease in their earliest changes. A normal EGFR means that kidney disease is unlikely. A low EGFR suggests that some kidney damage has occurred. GFR results are usually evaluated in the same way. Sometimes, in very early kidney damage (especially when the kidneys are damaged by diabetes), EGFR and GFR may actually be high, indicating that the kidneys are working harder than normal.  The [employer] has suggested that all persons "know their GFR number." They recommend interpreting GFR (usually by EGFR) based on the following information: KIDNEY DAMAGE STAGE: 1  DESCRIPTION: Kidney damage with normal or high GFR  GFR: 90+ KIDNEY DAMAGE STAGE: 2  DESCRIPTION: Mild decrease in GFR  GFR: 60-89 KIDNEY DAMAGE STAGE: 3  DESCRIPTION: Moderate decrease in GFR  GFR: 30-59 KIDNEY DAMAGE STAGE:4  DESCRIPTION: Severe decrease in GFR  GFR: 15-29 KIDNEY DAMAGE STAGE: 5  DESCRIPTION: Kidney failure  GFR: Less Than 15 Additionally in Stage 1, Protein or albumin in urine are high, cells or casts are seen in urine. GFR and EGFR increase during pregnancy. OBTAINING THE TEST RESULTS  Make sure you receive the results of your test. Ask as to how these results are to be obtained if you have not been informed. It is your responsibility to obtain your test results.  Your caregiver will provide further instructions or treatment options if necessary. Document Released: 03/21/2004 Document Revised: 05/11/2011 Document Reviewed: 05/15/2013 [facility] Patient Information 2015 [place], [place]. This information is not intended to replace advice given to  to you by your health care provider. Make sure you discuss any questions you have with your health care provider.  

## 2014-07-12 LAB — BASIC METABOLIC PANEL WITH GFR
BUN: 17 mg/dL (ref 6–23)
CO2: 28 meq/L (ref 19–32)
CREATININE: 0.74 mg/dL (ref 0.50–1.10)
Calcium: 9.6 mg/dL (ref 8.4–10.5)
Chloride: 108 mEq/L (ref 96–112)
GFR, Est African American: 85 mL/min
GFR, Est Non African American: 74 mL/min
Glucose, Bld: 86 mg/dL (ref 70–99)
Potassium: 4.6 mEq/L (ref 3.5–5.3)
SODIUM: 141 meq/L (ref 135–145)

## 2014-08-06 ENCOUNTER — Other Ambulatory Visit: Payer: Self-pay | Admitting: *Deleted

## 2014-08-06 MED ORDER — SIMVASTATIN 20 MG PO TABS
ORAL_TABLET | ORAL | Status: DC
Start: 2014-08-06 — End: 2014-10-29

## 2014-09-05 ENCOUNTER — Encounter: Payer: Self-pay | Admitting: Gastroenterology

## 2014-10-01 DIAGNOSIS — L80 Vitiligo: Secondary | ICD-10-CM | POA: Diagnosis not present

## 2014-10-22 ENCOUNTER — Ambulatory Visit (INDEPENDENT_AMBULATORY_CARE_PROVIDER_SITE_OTHER): Payer: Medicare Other | Admitting: Physician Assistant

## 2014-10-22 ENCOUNTER — Encounter: Payer: Self-pay | Admitting: Physician Assistant

## 2014-10-22 VITALS — BP 142/68 | HR 64 | Temp 97.5°F | Resp 16 | Ht 64.5 in | Wt 140.6 lb

## 2014-10-22 DIAGNOSIS — K648 Other hemorrhoids: Secondary | ICD-10-CM | POA: Diagnosis not present

## 2014-10-22 DIAGNOSIS — Z1331 Encounter for screening for depression: Secondary | ICD-10-CM

## 2014-10-22 DIAGNOSIS — E782 Mixed hyperlipidemia: Secondary | ICD-10-CM

## 2014-10-22 DIAGNOSIS — E559 Vitamin D deficiency, unspecified: Secondary | ICD-10-CM | POA: Diagnosis not present

## 2014-10-22 DIAGNOSIS — G4700 Insomnia, unspecified: Secondary | ICD-10-CM | POA: Diagnosis not present

## 2014-10-22 DIAGNOSIS — R6889 Other general symptoms and signs: Secondary | ICD-10-CM | POA: Diagnosis not present

## 2014-10-22 DIAGNOSIS — K5790 Diverticulosis of intestine, part unspecified, without perforation or abscess without bleeding: Secondary | ICD-10-CM

## 2014-10-22 DIAGNOSIS — Z0001 Encounter for general adult medical examination with abnormal findings: Secondary | ICD-10-CM | POA: Diagnosis not present

## 2014-10-22 DIAGNOSIS — E039 Hypothyroidism, unspecified: Secondary | ICD-10-CM

## 2014-10-22 DIAGNOSIS — Z79899 Other long term (current) drug therapy: Secondary | ICD-10-CM | POA: Diagnosis not present

## 2014-10-22 DIAGNOSIS — Z0000 Encounter for general adult medical examination without abnormal findings: Secondary | ICD-10-CM

## 2014-10-22 DIAGNOSIS — Z853 Personal history of malignant neoplasm of breast: Secondary | ICD-10-CM | POA: Diagnosis not present

## 2014-10-22 DIAGNOSIS — Z6823 Body mass index (BMI) 23.0-23.9, adult: Secondary | ICD-10-CM

## 2014-10-22 DIAGNOSIS — IMO0001 Reserved for inherently not codable concepts without codable children: Secondary | ICD-10-CM

## 2014-10-22 DIAGNOSIS — M1990 Unspecified osteoarthritis, unspecified site: Secondary | ICD-10-CM

## 2014-10-22 DIAGNOSIS — K635 Polyp of colon: Secondary | ICD-10-CM

## 2014-10-22 DIAGNOSIS — J3089 Other allergic rhinitis: Secondary | ICD-10-CM | POA: Diagnosis not present

## 2014-10-22 DIAGNOSIS — F411 Generalized anxiety disorder: Secondary | ICD-10-CM | POA: Diagnosis not present

## 2014-10-22 DIAGNOSIS — Z1389 Encounter for screening for other disorder: Secondary | ICD-10-CM

## 2014-10-22 DIAGNOSIS — R030 Elevated blood-pressure reading, without diagnosis of hypertension: Secondary | ICD-10-CM

## 2014-10-22 DIAGNOSIS — Z789 Other specified health status: Secondary | ICD-10-CM | POA: Diagnosis not present

## 2014-10-22 DIAGNOSIS — Z9181 History of falling: Secondary | ICD-10-CM

## 2014-10-22 LAB — CBC WITH DIFFERENTIAL/PLATELET
Basophils Absolute: 0.1 10*3/uL (ref 0.0–0.1)
Basophils Relative: 1 % (ref 0–1)
EOS ABS: 0.1 10*3/uL (ref 0.0–0.7)
Eosinophils Relative: 2 % (ref 0–5)
HCT: 40.3 % (ref 36.0–46.0)
HEMOGLOBIN: 13.4 g/dL (ref 12.0–15.0)
Lymphocytes Relative: 26 % (ref 12–46)
Lymphs Abs: 1.6 10*3/uL (ref 0.7–4.0)
MCH: 29.8 pg (ref 26.0–34.0)
MCHC: 33.3 g/dL (ref 30.0–36.0)
MCV: 89.6 fL (ref 78.0–100.0)
MONOS PCT: 9 % (ref 3–12)
MPV: 9.9 fL (ref 8.6–12.4)
Monocytes Absolute: 0.5 10*3/uL (ref 0.1–1.0)
NEUTROS ABS: 3.7 10*3/uL (ref 1.7–7.7)
Neutrophils Relative %: 62 % (ref 43–77)
PLATELETS: 254 10*3/uL (ref 150–400)
RBC: 4.5 MIL/uL (ref 3.87–5.11)
RDW: 13.7 % (ref 11.5–15.5)
WBC: 6 10*3/uL (ref 4.0–10.5)

## 2014-10-22 LAB — BASIC METABOLIC PANEL WITH GFR
BUN: 15 mg/dL (ref 7–25)
CHLORIDE: 105 mmol/L (ref 98–110)
CO2: 27 mmol/L (ref 20–31)
CREATININE: 0.71 mg/dL (ref 0.60–0.88)
Calcium: 9.3 mg/dL (ref 8.6–10.4)
GFR, EST NON AFRICAN AMERICAN: 77 mL/min (ref >=60)
GFR, Est African American: 89 mL/min (ref >=60)
Glucose, Bld: 86 mg/dL (ref 65–99)
POTASSIUM: 4.1 mmol/L (ref 3.5–5.3)
SODIUM: 143 mmol/L (ref 135–146)

## 2014-10-22 LAB — HEPATIC FUNCTION PANEL
ALK PHOS: 63 U/L (ref 33–130)
ALT: 16 U/L (ref 6–29)
AST: 19 U/L (ref 10–35)
Albumin: 4.1 g/dL (ref 3.6–5.1)
BILIRUBIN DIRECT: 0.2 mg/dL (ref <=0.2)
BILIRUBIN TOTAL: 1 mg/dL (ref 0.2–1.2)
Indirect Bilirubin: 0.8 mg/dL (ref 0.2–1.2)
Total Protein: 6.5 g/dL (ref 6.1–8.1)

## 2014-10-22 LAB — LIPID PANEL
Cholesterol: 178 mg/dL (ref 125–200)
HDL: 61 mg/dL (ref >=46)
LDL Cholesterol: 89 mg/dL (ref <130)
Total CHOL/HDL Ratio: 2.9 Ratio (ref <=5.0)
Triglycerides: 141 mg/dL (ref <150)
VLDL: 28 mg/dL (ref <30)

## 2014-10-22 LAB — URIC ACID: URIC ACID, SERUM: 4 mg/dL (ref 2.4–7.0)

## 2014-10-22 LAB — MAGNESIUM: Magnesium: 2.1 mg/dL (ref 1.5–2.5)

## 2014-10-22 LAB — TSH: TSH: 1.818 u[IU]/mL (ref 0.350–4.500)

## 2014-10-22 MED ORDER — SERTRALINE HCL 25 MG PO TABS: 25.0000 mg | ORAL_TABLET | Freq: Every day | ORAL | Status: DC

## 2014-10-22 NOTE — Patient Instructions (Signed)
Get on zoloft for anxiety.   Tumeric with black pepper extract is a great natural antiinflammatory that helps with arthritis and aches and pain. Can get from [employer] or any health food store. Need to take at least $Remove'800mg'arGSPgn$  twice a day with food.   Generalized Anxiety Disorder Generalized anxiety disorder (GAD) is a mental disorder. It interferes with life functions, including relationships, work, and school. GAD is different from normal anxiety, which everyone experiences at some point in their lives in response to specific life events and activities. Normal anxiety actually helps [place] prepare for and get through these life events and activities. Normal anxiety goes away after the event or activity is over.  GAD causes anxiety that is not necessarily related to specific events or activities. It also causes excess anxiety in proportion to specific events or activities. The anxiety associated with GAD is also difficult to control. GAD can vary from mild to severe. People with severe GAD can have intense waves of anxiety with physical symptoms (panic attacks).  SYMPTOMS The anxiety and worry associated with GAD are difficult to control. This anxiety and worry are related to many life events and activities and also occur more days than not for 6 months or longer. People with GAD also have three or more of the following symptoms (one or more in children):  Restlessness.   Fatigue.  Difficulty concentrating.   Irritability.  Muscle tension.  Difficulty sleeping or unsatisfying sleep. DIAGNOSIS GAD is diagnosed through an assessment by your health care provider. Your health care provider will ask you questions aboutyour mood,physical symptoms, and events in your life. Your health care provider may ask you about your medical history and use of alcohol or drugs, including prescription medicines. Your health care provider may also do a physical exam and blood tests. Certain medical conditions and the use  of certain substances can cause symptoms similar to those associated with GAD. Your health care provider may refer you to a mental health specialist for further evaluation. TREATMENT The following therapies are usually used to treat GAD:   Medication. Antidepressant medication usually is prescribed for long-term daily control. Antianxiety medicines may be added in severe cases, especially when panic attacks occur.   Talk therapy (psychotherapy). Certain types of talk therapy can be helpful in treating GAD by providing support, education, and guidance. A form of talk therapy called cognitive behavioral therapy can teach you healthy ways to think about and react to daily life events and activities.  Stress managementtechniques. These include yoga, meditation, and exercise and can be very helpful when they are practiced regularly. A mental health specialist can help determine which treatment is best for you. Some people see improvement with one therapy. However, other people require a combination of therapies. Document Released: 06/13/2012 Document Revised: 07/03/2013 Document Reviewed: 06/13/2012 [facility] Patient Information 2015 [place], [place]. This information is not intended to replace advice given to you by your health care provider. Make sure you discuss any questions you have with your health care provider.  Preventive Care for Adults A healthy lifestyle and preventive care can promote health and wellness. Preventive health guidelines for women include the following key practices.  A routine yearly physical is a good way to check with your health care provider about your health and preventive screening. It is a chance to share any concerns and updates on your health and to receive a thorough exam.  Visit your dentist for a routine exam and preventive care every 6 months. Brush your  teeth twice a day and floss once a day. Good oral hygiene prevents tooth decay and gum disease.  The  frequency of eye exams is based on your age, health, family medical history, use of contact lenses, and other factors. Follow your health care provider's recommendations for frequency of eye exams.  Eat a healthy diet. Foods like vegetables, fruits, whole grains, low-fat dairy products, and lean protein foods contain the nutrients you need without too many calories. Decrease your intake of foods high in solid fats, added sugars, and salt. Eat the right amount of calories for you.Get information about a proper diet from your health care provider, if necessary.  Regular physical exercise is one of the most important things you can do for your health. Most adults should get at least 150 minutes of moderate-intensity exercise (any activity that increases your heart rate and causes you to sweat) each week. In addition, most adults need muscle-strengthening exercises on 2 or more days a week.  Maintain a healthy weight. The body mass index (BMI) is a screening tool to identify possible weight problems. It provides an estimate of body fat based on height and weight. Your health care provider can find your BMI and can help you achieve or maintain a healthy weight.For adults 20 years and older:  A BMI below 18.5 is considered underweight.  A BMI of 18.5 to 24.9 is normal.  A BMI of 25 to 29.9 is considered overweight.  A BMI of 30 and above is considered obese.  Maintain normal blood lipids and cholesterol levels by exercising and minimizing your intake of saturated fat. Eat a balanced diet with plenty of fruit and vegetables. If your lipid or cholesterol levels are high, you are over 50, or you are at high risk for heart disease, you may need your cholesterol levels checked more frequently.Ongoing high lipid and cholesterol levels should be treated with medicines if diet and exercise are not working.  If you smoke, find out from your health care provider how to quit. If you do not use tobacco, do not  start.  Lung cancer screening is recommended for adults aged 57-80 years who are at high risk for developing lung cancer because of a history of smoking. A yearly low-dose CT scan of the lungs is recommended for people who have at least a 30-pack-year history of smoking and are a current smoker or have quit within the past 15 years. A pack year of smoking is smoking an average of 1 pack of cigarettes a day for 1 year (for example: 1 pack a day for 30 years or 2 packs a day for 15 years). Yearly screening should continue until the smoker has stopped smoking for at least 15 years. Yearly screening should be stopped for people who develop a health problem that would prevent them from having lung cancer treatment.  Avoid use of street drugs. Do not share needles with anyone. Ask for help if you need support or instructions about stopping the use of drugs.  High blood pressure causes heart disease and increases the risk of stroke.  Ongoing high blood pressure should be treated with medicines if weight loss and exercise do not work.  If you are 23-79 years old, ask your health care provider if you should take aspirin to prevent strokes.  Diabetes screening involves taking a blood sample to check your fasting blood sugar level. This should be done once every 3 years, after age 33, if you are within normal weight and  without risk factors for diabetes. Testing should be considered at a younger age or be carried out more frequently if you are overweight and have at least 1 risk factor for diabetes.  Breast cancer screening is essential preventive care for women. You should practice "breast self-awareness." This means understanding the normal appearance and feel of your breasts and may include breast self-examination. Any changes detected, no matter how small, should be reported to a health care provider. Women in their 34s and 30s should have a clinical breast exam (CBE) by a health care provider as part of a  regular health exam every 1 to 3 years. After age 25, women should have a CBE every year. Starting at age 31, women should consider having a mammogram (breast X-ray test) every year. Women who have a family history of breast cancer should talk to their health care provider about genetic screening. Women at a high risk of breast cancer should talk to their health care providers about having an MRI and a mammogram every year.  Breast cancer gene (BRCA)-related cancer risk assessment is recommended for women who have family members with BRCA-related cancers. BRCA-related cancers include breast, ovarian, tubal, and peritoneal cancers. Having family members with these cancers may be associated with an increased risk for harmful changes (mutations) in the breast cancer genes BRCA1 and BRCA2. Results of the assessment will determine the need for genetic counseling and BRCA1 and BRCA2 testing.  Routine pelvic exams to screen for cancer are no longer recommended for nonpregnant women who are considered low risk for cancer of the pelvic organs (ovaries, uterus, and vagina) and who do not have symptoms. Ask your health care provider if a screening pelvic exam is right for you.  If you have had past treatment for cervical cancer or a condition that could lead to cancer, you need Pap tests and screening for cancer for at least 20 years after your treatment. If Pap tests have been discontinued, your risk factors (such as having a new sexual partner) need to be reassessed to determine if screening should be resumed. Some women have medical problems that increase the chance of getting cervical cancer. In these cases, your health care provider may recommend more frequent screening and Pap tests.    Colorectal cancer can be detected and often prevented. Most routine colorectal cancer screening begins at the age of 70 years and continues through age 46 years. However, your health care provider may recommend screening at an  earlier age if you have risk factors for colon cancer. On a yearly basis, your health care provider may provide home test kits to check for hidden blood in the stool. Use of a small camera at the end of a tube, to directly examine the colon (sigmoidoscopy or colonoscopy), can detect the earliest forms of colorectal cancer. Talk to your health care provider about this at age 71, when routine screening begins. Direct exam of the colon should be repeated every 5-10 years through age 40 years, unless early forms of pre-cancerous polyps or small growths are found.  Osteoporosis is a disease in which the bones lose minerals and strength with aging. This can result in serious bone fractures or breaks. The risk of osteoporosis can be identified using a bone density scan. Women ages 49 years and over and women at risk for fractures or osteoporosis should discuss screening with their health care providers. Ask your health care provider whether you should take a calcium supplement or vitamin D to reduce the  rate of osteoporosis.  Menopause can be associated with physical symptoms and risks. Hormone replacement therapy is available to decrease symptoms and risks. You should talk to your health care provider about whether hormone replacement therapy is right for you.  Use sunscreen. Apply sunscreen liberally and repeatedly throughout the day. You should seek shade when your shadow is shorter than you. Protect yourself by wearing long sleeves, pants, a wide-brimmed hat, and sunglasses year round, whenever you are outdoors.  Once a month, do a whole body skin exam, using a mirror to look at the skin on your back. Tell your health care provider of new moles, moles that have irregular borders, moles that are larger than a pencil eraser, or moles that have changed in shape or color.  Stay current with required vaccines (immunizations).  Influenza vaccine. All adults should be immunized every year.  Tetanus, diphtheria,  and acellular pertussis (Td, Tdap) vaccine. Pregnant women should receive 1 dose of Tdap vaccine during each pregnancy. The dose should be obtained regardless of the length of time since the last dose. Immunization is preferred during the 27th-36th week of gestation. An adult who has not previously received Tdap or who does not know her vaccine status should receive 1 dose of Tdap. This initial dose should be followed by tetanus and diphtheria toxoids (Td) booster doses every 10 years. Adults with an unknown or incomplete history of completing a 3-dose immunization series with Td-containing vaccines should begin or complete a primary immunization series including a Tdap dose. Adults should receive a Td booster every 10 years.    Zoster vaccine. One dose is recommended for adults aged 5 years or older unless certain conditions are present.    Pneumococcal 13-valent conjugate (PCV13) vaccine. When indicated, a person who is uncertain of her immunization history and has no record of immunization should receive the PCV13 vaccine. An adult aged 2 years or older who has certain medical conditions and has not been previously immunized should receive 1 dose of PCV13 vaccine. This PCV13 should be followed with a dose of pneumococcal polysaccharide (PPSV23) vaccine. The PPSV23 vaccine dose should be obtained at least 8 weeks after the dose of PCV13 vaccine. An adult aged 9 years or older who has certain medical conditions and previously received 1 or more doses of PPSV23 vaccine should receive 1 dose of PCV13. The PCV13 vaccine dose should be obtained 1 or more years after the last PPSV23 vaccine dose.    Pneumococcal polysaccharide (PPSV23) vaccine. When PCV13 is also indicated, PCV13 should be obtained first. All adults aged 68 years and older should be immunized. An adult younger than age 25 years who has certain medical conditions should be immunized. Any person who resides in a nursing home or long-term  care facility should be immunized. An adult smoker should be immunized. People with an immunocompromised condition and certain other conditions should receive both PCV13 and PPSV23 vaccines. People with human immunodeficiency virus (HIV) infection should be immunized as soon as possible after diagnosis. Immunization during chemotherapy or radiation therapy should be avoided. Routine use of PPSV23 vaccine is not recommended for [place] Indians, [place], or people younger than 65 years unless there are medical conditions that require PPSV23 vaccine. When indicated, people who have unknown immunization and have no record of immunization should receive PPSV23 vaccine. One-time revaccination 5 years after the first dose of PPSV23 is recommended for people aged 19-64 years who have chronic kidney failure, nephrotic syndrome, asplenia, or immunocompromised conditions. People  who received 1-2 doses of PPSV23 before age 33 years should receive another dose of PPSV23 vaccine at age 67 years or later if at least 5 years have passed since the previous dose. Doses of PPSV23 are not needed for people immunized with PPSV23 at or after age 55 years.   Preventive Services / Frequency  Ages 77 years and over  Blood pressure check.  Lipid and cholesterol check.  Lung cancer screening. / Every year if you are aged 59-80 years and have a 30-pack-year history of smoking and currently smoke or have quit within the past 15 years. Yearly screening is stopped once you have quit smoking for at least 15 years or develop a health problem that would prevent you from having lung cancer treatment.  Clinical breast exam.** / Every year after age 72 years.  BRCA-related cancer risk assessment.** / For women who have family members with a BRCA-related cancer (breast, ovarian, tubal, or peritoneal cancers).  Mammogram.** / Every year beginning at age 44 years and continuing for as long as you are in good health. Consult with  your health care provider.  Pap test.** / Every 3 years starting at age 23 years through age 79 or 58 years with 3 consecutive normal Pap tests. Testing can be stopped between 65 and 70 years with 3 consecutive normal Pap tests and no abnormal Pap or HPV tests in the past 10 years.  Fecal occult blood test (FOBT) of stool. / Every year beginning at age 39 years and continuing until age 30 years. You may not need to do this test if you get a colonoscopy every 10 years.  Flexible sigmoidoscopy or colonoscopy.** / Every 5 years for a flexible sigmoidoscopy or every 10 years for a colonoscopy beginning at age 71 years and continuing until age 45 years.  Hepatitis C blood test.** / For all people born from [birth year] through 1963 and any individual with known risks for hepatitis C.  Osteoporosis screening.** / A one-time screening for women ages 68 years and over and women at risk for fractures or osteoporosis.  Skin self-exam. / Monthly.  Influenza vaccine. / Every year.  Tetanus, diphtheria, and acellular pertussis (Tdap/Td) vaccine.** / 1 dose of Td every 10 years.  Zoster vaccine.** / 1 dose for adults aged 61 years or older.  Pneumococcal 13-valent conjugate (PCV13) vaccine.** / Consult your health care provider.  Pneumococcal polysaccharide (PPSV23) vaccine.** / 1 dose for all adults aged 55 years and older. Screening for abdominal aortic aneurysm (AAA)  by ultrasound is recommended for people who have history of high blood pressure or who are current or former smokers.

## 2014-10-22 NOTE — Progress Notes (Signed)
MEDICARE ANNUAL WELLNESS VISIT AND CPE  Assessment:   1. Hypothyroidism, unspecified hypothyroidism type Hypothyroidism-check TSH level, continue medications the same, reminded to take on an empty stomach 30-68mins before food.  - TSH  2. Elevated BP - continue medications, DASH diet, exercise and monitor at home. Call if greater than 130/80.  - CBC with Differential/Platelet - BASIC METABOLIC PANEL WITH GFR - Hepatic function panel - Urinalysis, Routine w reflex microscopic (not at [facility]) - Microalbumin / creatinine urine ratio - EKG 12-Lead  3. Mixed hyperlipidemia -continue medications, check lipids, decrease fatty foods, increase activity.  - Lipid panel  4. Vitamin D deficiency - Vit D  25 hydroxy (rtn osteoporosis monitoring)  5. Medication management - Magnesium  6. Personal history of malignant neoplasm of breast Up to date on MGM  7. Other allergic rhinitis - Allegra OTC, increase H20, allergy hygiene explained.  8. Internal hemorrhoids better  9. Diverticulosis of intestine without bleeding, unspecified intestinal tract location Increase fiber  10. Colon polyps No colonoscopy due to age  81. Osteoarthritis, unspecified osteoarthritis type, unspecified site - Uric acid  12. Insomnia Insomnia- good sleep hygiene discussed, increase day time activity, try melatonin or benadryl if this does not help we will call in sleep medication.   13. Generalized anxiety disorder Start very low dose of zoloft, 25mg  daily  Over 40 minutes of exam, counseling, chart review and critical decision making was performed  Plan:   During the course of the visit the patient was educated and counseled about appropriate screening and preventive services including:    Pneumococcal vaccine   Prevnar 13  Influenza vaccine  Td vaccine  Screening electrocardiogram  Bone densitometry screening  Colorectal cancer screening  Diabetes screening  Glaucoma  screening  Nutrition counseling   Advanced directives: requested  Conditions/risks identified: BMI: Discussed weight loss, diet, and increase physical activity.  Increase physical activity: AHA recommends 150 minutes of physical activity a week.  Medications reviewed Urinary Incontinence is not an issue: discussed non pharmacology and pharmacology options.  Fall risk: low- discussed PT, home fall assessment, medications.    Subjective:  Alexandria Davies is a 81 y.o. female who presents for Medicare Annual Wellness Visit and complete physical.  Date of last medicare wellness visit was 09/2013   Her blood pressure has been controlled at home, today their BP is BP: (!) 142/68 mmHg She does not workout. She denies chest pain, shortness of breath, dizziness.  She works at [employer] at [employer], has some left foot pain, has OA bilateral hands, has seen Dr. Grandville Silos and had injection that has helped. She is left handed.  She has IBS and follows with Dr. Deatra Ina, diet controlled.  She is on cholesterol medication and denies myalgias. Her cholesterol is at goal. The cholesterol last visit was:   Lab Results  Component Value Date   CHOL 176 05/16/2014   HDL 64 05/16/2014   LDLCALC 87 05/16/2014   TRIG 124 05/16/2014   CHOLHDL 2.8 05/16/2014    Last A1C in the office was:  Lab Results  Component Value Date   HGBA1C 5.5 02/14/2014   Patient is on Vitamin D supplement.   Lab Results  Component Value Date   VD25OH 41 05/16/2014     She is on thyroid medication. Her medication was not changed last visit.   Lab Results  Component Value Date   TSH 1.801 05/16/2014   She will rarely take valium for sleep at night, but  it is rare.   Medication Review: Current Outpatient Prescriptions on File Prior to Visit  Medication Sig Dispense Refill  . Azelastine-Fluticasone 137-50 MCG/ACT SUSP Place 1-2 puffs into the nose at bedtime. 1 Bottle 6  . Biotin 1000 MCG tablet Take 500 mcg by mouth  daily.      . Calcium Carbonate-Vitamin D (CALCIUM 600+D HIGH POTENCY) 600-400 MG-UNIT per tablet Take 1 tablet by mouth daily.      . cholecalciferol (VITAMIN D) 1000 UNITS tablet Take 2,000 Units by mouth 3 (three) times daily.     . clotrimazole-betamethasone (LOTRISONE) cream Apply 1 application topically 2 (two) times daily. 45 g 2  . diazepam (VALIUM) 5 MG tablet Take 1 tablet (5 mg total) by mouth every 8 (eight) hours as needed for anxiety. 90 tablet 0  . levothyroxine (SYNTHROID, LEVOTHROID) 150 MCG tablet Take 1 tablet (150 mcg total) by mouth daily. 90 tablet 1  . Multiple Vitamin (ONE-A-DAY 55 PLUS PO) Take by mouth daily.    . Potassium Gluconate 595 MG CAPS Take 595 mg by mouth daily.      . simvastatin (ZOCOR) 20 MG tablet Take 1 tablet by mouth  every night at bedtime 90 tablet 1  . vitamin C (ASCORBIC ACID) 500 MG tablet Take 500 mg by mouth daily.      . vitamin E (VITAMIN E) 400 UNIT capsule Take 400 Units by mouth daily.       No current facility-administered medications on file prior to visit.    Current Problems (verified) Patient Active Problem List   Diagnosis Date Noted  . Insomnia 10/22/2014  . Elevated BP 02/14/2014  . Mixed hyperlipidemia 02/14/2014  . Vitamin D deficiency 02/14/2014  . Medication management 02/14/2014  . Rhinitis, allergic 08/19/2011  . Diverticulosis 04/23/2011  . Colon polyps 04/23/2011  . PERSONAL HISTORY OF MALIGNANT NEOPLASM OF BREAST 12/07/2007  . Hypothyroidism 12/05/2007  . Osteoarthritis 12/05/2007  . Internal hemorrhoids 11/08/2002    Screening Tests Immunization History  Administered Date(s) Administered  . Influenza Split 12/01/2010  . Influenza-Unspecified 10/31/2012  . Pneumococcal Conjugate-13 10/18/2013  . Pneumococcal Polysaccharide-23 03/20/2010  . Td 10/17/2012   Preventative care: Last colonoscopy: 2009 Last mammogram: 2016 Last pap smear/pelvic exam: remote   DEXA:2011 Ct AB 2013  Prior  vaccinations: TD or Tdap: 2014  Influenza: 2015  Pneumococcal: 2012 Prevnar13: 2015 Shingles/Zostavax: N/A  Names of Other Physician/Practitioners you currently use: 1. [facility] here for primary care 2. Dr. Einar Gip, eye doctor, last visit March 2016 3. Dr. Tenna Child, dentist, last visit March 2015 Patient Care Team: Unk Pinto, MD as PCP - General (Internal Medicine) Melrose Nakayama, MD as Consulting Physician (Orthopedic Surgery) Deneise Lever, MD as Consulting Physician (Pulmonary Disease) Inda Castle, MD as Consulting Physician (Gastroenterology) Carolan Clines, MD as Consulting Physician (Urology)  SURGICAL HISTORY Past Surgical History  Procedure Laterality Date  . Abdominal hysterectomy    . Bladder tac    . Bunionectomy      bilateral  . Right shoulder  2003    bone spur removed-rcr  . Wrist fracture surgery  2003    left-lipoma  . Breast lumpectomy  2004    left lump-snbx  . Cystocele repair  2009  . Tonsillectomy    . Knee arthroscopy Left 01/17/2013    Procedure: ARTHROSCOPY LEFT KNEE;  Surgeon: Hessie Dibble, MD;  Location: [place];  Service: Orthopedics;  Laterality: Left;  partial medial and  partial lateral chondroplasty and removal loose body  . Colonoscopy  2009    several   FAMILY HISTORY Family History  Problem Relation Age of Onset  . Breast cancer Sister     x 2  . Colon cancer Son   . Heart disease Mother     died at age 22  . Cancer Sister 22    breast  . Cancer Sister 47    breast cancer   SOCIAL HISTORY Social History  Substance Use Topics  . Smoking status: Never Smoker   . Smokeless tobacco: Never Used  . Alcohol Use: Yes     Comment: ocassionally    MEDICARE WELLNESS OBJECTIVES: Tobacco use: She does not smoke.  Patient is not a former smoker. Alcohol Current alcohol use: social drinker Osteoporosis: postmenopausal estrogen deficiency and dietary calcium  and/or vitamin D deficiency, History of fracture in the past year: no Fall risk: Low Risk Hearing: normal Visual acuity: normal,  does perform annual eye exam Diet: in general, a "healthy" diet   Physical activity: Current Exercise Habits:: Exercise is limited by, Limited by:: orthopedic condition(s) Cardiac risk factors: Cardiac Risk Factors include: advanced age (>5men, >64 women);dyslipidemia;sedentary lifestyle;hypertension Depression/mood screen:   Depression screen [facility] 2/9 10/22/2014  Decreased Interest 0  Down, Depressed, Hopeless 0  PHQ - 2 Score 0    ADLs:  In your present state of health, do you have any difficulty performing the following activities: 10/22/2014  Hearing? N  Vision? N  Difficulty concentrating or making decisions? N  Walking or climbing stairs? N  Dressing or bathing? N  Doing errands, shopping? N  Preparing Food and eating ? N  Using the Toilet? N  In the past six months, have you accidently leaked urine? N  Do you have problems with loss of bowel control? N  Managing your Medications? N  Managing your Finances? N  Housekeeping or managing your Housekeeping? N     Cognitive Testing  Alert? Yes  Normal Appearance?Yes  Oriented to person? Yes  Place? Yes   Time? Yes  Recall of three objects?  2/3  Can perform simple calculations? Yes  Displays appropriate judgment?Yes  Can read the correct time from a watch face?Yes  EOL planning: Does patient have an advance directive?: Yes Type of Advance Directive: Healthcare Power of Attorney, Living will Does patient want to make changes to advanced directive?: No - Patient declined Copy of advanced directive(s) in chart?: No - copy requested yes  Review of Systems  Constitutional: Negative.   HENT: Negative.   Eyes: Negative.   Respiratory: Negative.   Cardiovascular: Negative.   Gastrointestinal: Negative.   Genitourinary: Negative.   Musculoskeletal: Positive for myalgias, back pain and joint pain.  Negative for falls and neck pain.  Skin: Negative.   Neurological: Negative.   Psychiatric/Behavioral: Negative for depression, suicidal ideas, hallucinations, memory loss and substance abuse. The patient is nervous/anxious. The patient does not have insomnia.      Objective:     Today's Vitals   10/22/14 1004  BP: 142/68  Pulse: 64  Temp: 97.5 F (36.4 C)  Resp: 16  Height: 5' 4.5" (1.638 m)  Weight: 140 lb 9.6 oz (63.776 kg)   Body mass index is 23.77 kg/(m^2).  General appearance: alert, no distress, WD/WN, female HEENT: normocephalic, sclerae anicteric, TMs pearly, nares patent, no discharge or erythema, pharynx normal Oral cavity: MMM, no lesions Neck: supple, no lymphadenopathy, no thyromegaly, no masses Heart: RRR, normal S1, S2, no  murmurs Lungs: CTA bilaterally, no wheezes, rhonchi, or rales Abdomen: +bs, soft, non tender, non distended, no masses, no hepatomegaly, no splenomegaly Musculoskeletal: nontender, no swelling, no obvious deformity Extremities: no edema, no cyanosis, no clubbing Pulses: 2+ symmetric, upper and lower extremities, normal cap refill Neurological: alert, oriented x 3, CN2-12 intact, strength normal upper extremities and lower extremities, sensation normal throughout, DTRs 2+ throughout, no cerebellar signs, gait normal Psychiatric: normal affect, behavior normal, pleasant  Skin: vitiligo on neck  Medicare Attestation I have personally reviewed: The patient's medical and social history Their use of alcohol, tobacco or illicit drugs Their current medications and supplements The patient's functional ability including ADLs,fall risks, home safety risks, cognitive, and hearing and visual impairment Diet and physical activities Evidence for depression or mood disorders  The patient's weight, height, BMI, and visual acuity have been recorded in the chart.  I have made referrals, counseling, and provided education to the patient based on review of  the above and I have provided the patient with a written personalized care plan for preventive services.     Vicie Mutters, PA-C   10/22/2014

## 2014-10-23 LAB — VITAMIN D 25 HYDROXY (VIT D DEFICIENCY, FRACTURES): Vit D, 25-Hydroxy: 49 ng/mL (ref 30–100)

## 2014-10-23 LAB — URINALYSIS, ROUTINE W REFLEX MICROSCOPIC
BILIRUBIN URINE: NEGATIVE
Glucose, UA: NEGATIVE
Hgb urine dipstick: NEGATIVE
Ketones, ur: NEGATIVE
LEUKOCYTES UA: NEGATIVE
Nitrite: NEGATIVE
PH: 6 (ref 5.0–8.0)
Protein, ur: NEGATIVE
Specific Gravity, Urine: 1.013 (ref 1.001–1.035)

## 2014-10-23 LAB — MICROALBUMIN / CREATININE URINE RATIO
Creatinine, Urine: 68.2 mg/dL
MICROALB/CREAT RATIO: 2.9 mg/g (ref 0.0–30.0)
Microalb, Ur: 0.2 mg/dL (ref <2.0)

## 2014-10-29 ENCOUNTER — Other Ambulatory Visit: Payer: Self-pay | Admitting: Internal Medicine

## 2014-11-21 ENCOUNTER — Telehealth: Payer: Self-pay | Admitting: Internal Medicine

## 2014-11-21 NOTE — Telephone Encounter (Signed)
Alexandria Davies from [facility], with Dr Posey Pronto called to advise Dr Melford Aase that Ed Blalock declined a referral appointment with Dr Posey Pronto.  The patient indicated that she did not need a Kidney doctor, she saw her primary doctor, had labs and was told she was doing fine. Please advise if Dr Serita Grit office should hold this referral open or close it out.  [phone], ext [place]  Thank you, Leonie Douglas Referral Coordinator  [facility] [phone]. [phone] Fax [phone]

## 2014-11-28 DIAGNOSIS — M65332 Trigger finger, left middle finger: Secondary | ICD-10-CM | POA: Diagnosis not present

## 2015-01-02 DIAGNOSIS — M65332 Trigger finger, left middle finger: Secondary | ICD-10-CM | POA: Diagnosis not present

## 2015-01-09 ENCOUNTER — Ambulatory Visit (INDEPENDENT_AMBULATORY_CARE_PROVIDER_SITE_OTHER): Payer: Medicare Other

## 2015-01-09 ENCOUNTER — Ambulatory Visit (INDEPENDENT_AMBULATORY_CARE_PROVIDER_SITE_OTHER): Payer: Medicare Other | Admitting: Podiatry

## 2015-01-09 ENCOUNTER — Encounter: Payer: Self-pay | Admitting: Podiatry

## 2015-01-09 VITALS — BP 141/79 | HR 61 | Resp 12

## 2015-01-09 DIAGNOSIS — R52 Pain, unspecified: Secondary | ICD-10-CM

## 2015-01-09 DIAGNOSIS — M719 Bursopathy, unspecified: Secondary | ICD-10-CM | POA: Diagnosis not present

## 2015-01-09 NOTE — Progress Notes (Signed)
   Subjective:    [name], female    DOB: May 09, 1925, 81 y.o.   MRN: [id number]  HPI this 81 year old female presents to the office with chief complaint of a chronic pain noted on the top of her left foot. She states she dropped an object on the top of her left foot in July. She states the pain has been present since that time and she has been having difficulty walking due to the pain in the top of her left foot. She thought the pain would be gone at this juncture, so she presents to the office today for an evaluation and treatment of this condition Patient presents here today with left top of foot pain   Review of Systems  HENT:       Ringing in ears  All other systems reviewed and are negative.      Objective:   Physical Exam vascular dorsalis pedis and posterior tibial pulses are weak, equal, regular bilaterally. Temperature gradient is within normal limits. Capillary return is within normal limits. Neurologically, muscle power is within normal limits and epicritic sensations are intact orthopedically there is palpable pain noted at the base of the second metatarsal of the left foot. No evidence of any ecchymosis or swelling noted at this time        Assessment & Plan:  Bursitis secondary probable fracture second metatarsal left foot.  Treatment  IE  Xray was taken reveal probable fracture second metatarsal left foot.  Injection therapy left foot. RTC prn  Gardiner Barefoot DPM

## 2015-01-14 DIAGNOSIS — M1712 Unilateral primary osteoarthritis, left knee: Secondary | ICD-10-CM | POA: Diagnosis not present

## 2015-01-14 DIAGNOSIS — M1711 Unilateral primary osteoarthritis, right knee: Secondary | ICD-10-CM | POA: Diagnosis not present

## 2015-01-15 NOTE — Addendum Note (Signed)
Addended by: Vicie Mutters R on: 01/15/2015 10:54 AM   Modules accepted: Miquel Dunn

## 2015-02-05 ENCOUNTER — Encounter: Payer: Self-pay | Admitting: Physician Assistant

## 2015-02-05 ENCOUNTER — Ambulatory Visit (INDEPENDENT_AMBULATORY_CARE_PROVIDER_SITE_OTHER): Payer: Medicare Other | Admitting: Physician Assistant

## 2015-02-05 VITALS — BP 140/78 | HR 65 | Temp 97.5°F | Resp 14 | Ht 64.5 in | Wt 141.0 lb

## 2015-02-05 DIAGNOSIS — R350 Frequency of micturition: Secondary | ICD-10-CM | POA: Diagnosis not present

## 2015-02-05 DIAGNOSIS — M545 Low back pain, unspecified: Secondary | ICD-10-CM

## 2015-02-05 DIAGNOSIS — E039 Hypothyroidism, unspecified: Secondary | ICD-10-CM | POA: Diagnosis not present

## 2015-02-05 MED ORDER — DIAZEPAM 5 MG PO TABS: 5.0000 mg | ORAL_TABLET | Freq: Three times a day (TID) | ORAL | Status: DC | PRN

## 2015-02-05 MED ORDER — MELOXICAM 15 MG PO TABS: ORAL_TABLET | ORAL | Status: DC

## 2015-02-05 MED ORDER — SIMVASTATIN 20 MG PO TABS: ORAL_TABLET | ORAL | Status: DC

## 2015-02-05 NOTE — Patient Instructions (Signed)
Can get synthroid direct is supported by [employer] in [place], [place]. It is a way to get BRAND NAME synthroid sent directly to your house. I will send a prescription to a mail order pharmacy, you need to call the pharmacy at [phone] or go online to [email or web address] to get started.   Cost:  Pay 25$ for a one month supply Pay 45$ for a two month supply Pay 65$ for a 3 month supply and save 10$   Low Back Sprain With Rehab A sprain is an injury in which a ligament is torn. The ligaments of the lower back are vulnerable to sprains. However, they are strong and require great force to be injured. These ligaments are important for stabilizing the spinal column. Sprains are classified into three categories. Grade 1 sprains cause pain, but the tendon is not lengthened. Grade 2 sprains include a lengthened ligament, due to the ligament being stretched or partially ruptured. With grade 2 sprains there is still function, although the function may be decreased. Grade 3 sprains involve a complete tear of the tendon or muscle, and function is usually impaired. SYMPTOMS   Severe pain in the lower back.  Sometimes, a feeling of a "pop," "snap," or tear, at the time of injury.  Tenderness and sometimes swelling at the injury site.  Uncommonly, bruising (contusion) within 48 hours of injury.  Muscle spasms in the back. CAUSES  Low back sprains occur when a force is placed on the ligaments that is greater than they can handle. Common causes of injury include:  Performing a stressful act while off-balance.  Repetitive stressful activities that involve movement of the lower back.  Direct hit (trauma) to the lower back. RISK INCREASES WITH:  Contact sports (football, wrestling).  Collisions (major skiing accidents).  Sports that require throwing or lifting (baseball, weightlifting).  Sports involving twisting of the spine (gymnastics, diving, tennis, golf).  Poor strength and  flexibility.  Inadequate protection.  Previous back injury or surgery (especially fusion). PREVENTION  Wear properly fitted and padded protective equipment.  Warm up and stretch properly before activity.  Allow for adequate recovery between workouts.  Maintain physical fitness:  Strength, flexibility, and endurance.  Cardiovascular fitness.  Maintain a healthy body weight. PROGNOSIS  If treated properly, low back sprains usually heal with non-surgical treatment. The length of time for healing depends on the severity of the injury.  RELATED COMPLICATIONS   Recurring symptoms, resulting in a chronic problem.  Chronic inflammation and pain in the low back.  Delayed healing or resolution of symptoms, especially if activity is resumed too soon.  Prolonged impairment.  Unstable or arthritic joints of the low back. TREATMENT  Treatment first involves the use of ice and medicine, to reduce pain and inflammation. The use of strengthening and stretching exercises may help reduce pain with activity. These exercises may be performed at home or with a therapist. Severe injuries may require referral to a therapist for further evaluation and treatment, such as ultrasound. Your caregiver may advise that you wear a back brace or corset, to help reduce pain and discomfort. Often, prolonged bed rest results in greater harm then benefit. Corticosteroid injections may be recommended. However, these should be reserved for the most serious cases. It is important to avoid using your back when lifting objects. At night, sleep on your back on a firm mattress, with a pillow placed under your knees. If non-surgical treatment is unsuccessful, surgery may be needed.  MEDICATION   If pain medicine  is needed, nonsteroidal anti-inflammatory medicines (aspirin and ibuprofen), or other minor pain relievers (acetaminophen), are often advised.  Do not take pain medicine for 7 days before surgery.  Prescription  pain relievers may be given, if your caregiver thinks they are needed. Use only as directed and only as much as you need.  Ointments applied to the skin may be helpful.  Corticosteroid injections may be given by your caregiver. These injections should be reserved for the most serious cases, because they may only be given a certain number of times. HEAT AND COLD  Cold treatment (icing) should be applied for 10 to 15 minutes every 2 to 3 hours for inflammation and pain, and immediately after activity that aggravates your symptoms. Use ice packs or an ice massage.  Heat treatment may be used before performing stretching and strengthening activities prescribed by your caregiver, physical therapist, or athletic trainer. Use a heat pack or a warm water soak. SEEK MEDICAL CARE IF:   Symptoms get worse or do not improve in 2 to 4 weeks, despite treatment.  You develop numbness or weakness in either leg.  You lose bowel or bladder function.  Any of the following occur after surgery: fever, increased pain, swelling, redness, drainage of fluids, or bleeding in the affected area.  New, unexplained symptoms develop. (Drugs used in treatment may produce side effects.) EXERCISES  RANGE OF MOTION (ROM) AND STRETCHING EXERCISES - Low Back Sprain Most people with lower back pain will find that their symptoms get worse with excessive bending forward (flexion) or arching at the lower back (extension). The exercises that will help resolve your symptoms will focus on the opposite motion.  Your physician, physical therapist or athletic trainer will help you determine which exercises will be most helpful to resolve your lower back pain. Do not complete any exercises without first consulting with your caregiver. Discontinue any exercises which make your symptoms worse, until you speak to your caregiver. If you have pain, numbness or tingling which travels down into your buttocks, leg or foot, the goal of the therapy  is for these symptoms to move closer to your back and eventually resolve. Sometimes, these leg symptoms will get better, but your lower back pain may worsen. This is often an indication of progress in your rehabilitation. Be very alert to any changes in your symptoms and the activities in which you participated in the 24 hours prior to the change. Sharing this information with your caregiver will allow him or her to most efficiently treat your condition. These exercises may help you when beginning to rehabilitate your injury. Your symptoms may resolve with or without further involvement from your physician, physical therapist or athletic trainer. While completing these exercises, remember:   Restoring tissue flexibility helps normal motion to return to the joints. This allows healthier, less painful movement and activity.  An effective stretch should be held for at least 30 seconds.  A stretch should never be painful. You should only feel a gentle lengthening or release in the stretched tissue. FLEXION RANGE OF MOTION AND STRETCHING EXERCISES: STRETCH - Flexion, Single Knee to Chest   Lie on a firm bed or floor with both legs extended in front of you.  Keeping one leg in contact with the floor, bring your opposite knee to your chest. Hold your leg in place by either grabbing behind your thigh or at your knee.  Pull until you feel a gentle stretch in your low back. Hold __________ seconds.  Slowly release  your grasp and repeat the exercise with the opposite side. Repeat __________ times. Complete this exercise __________ times per day.  STRETCH - Flexion, Double Knee to Chest  Lie on a firm bed or floor with both legs extended in front of you.  Keeping one leg in contact with the floor, bring your opposite knee to your chest.  Tense your stomach muscles to support your back and then lift your other knee to your chest. Hold your legs in place by either grabbing behind your thighs or at your  knees.  Pull both knees toward your chest until you feel a gentle stretch in your low back. Hold __________ seconds.  Tense your stomach muscles and slowly return one leg at a time to the floor. Repeat __________ times. Complete this exercise __________ times per day.  STRETCH - Low Trunk Rotation  Lie on a firm bed or floor. Keeping your legs in front of you, bend your knees so they are both pointed toward the ceiling and your feet are flat on the floor.  Extend your arms out to the side. This will stabilize your upper body by keeping your shoulders in contact with the floor.  Gently and slowly drop both knees together to one side until you feel a gentle stretch in your low back. Hold for __________ seconds.  Tense your stomach muscles to support your lower back as you bring your knees back to the starting position. Repeat the exercise to the other side. Repeat __________ times. Complete this exercise __________ times per day  EXTENSION RANGE OF MOTION AND FLEXIBILITY EXERCISES: STRETCH - Extension, Prone on Elbows   Lie on your stomach on the floor, a bed will be too soft. Place your palms about shoulder width apart and at the height of your head.  Place your elbows under your shoulders. If this is too painful, stack pillows under your chest.  Allow your body to relax so that your hips drop lower and make contact more completely with the floor.  Hold this position for __________ seconds.  Slowly return to lying flat on the floor. Repeat __________ times. Complete this exercise __________ times per day.  RANGE OF MOTION - Extension, Prone Press Ups  Lie on your stomach on the floor, a bed will be too soft. Place your palms about shoulder width apart and at the height of your head.  Keeping your back as relaxed as possible, slowly straighten your elbows while keeping your hips on the floor. You may adjust the placement of your hands to maximize your comfort. As you gain motion, your  hands will come more underneath your shoulders.  Hold this position __________ seconds.  Slowly return to lying flat on the floor. Repeat __________ times. Complete this exercise __________ times per day.  RANGE OF MOTION- Quadruped, Neutral Spine   Assume a hands and knees position on a firm surface. Keep your hands under your shoulders and your knees under your hips. You may place padding under your knees for comfort.  Drop your head and point your tailbone toward the ground below you. This will round out your lower back like an angry cat. Hold this position for __________ seconds.  Slowly lift your head and release your tail bone so that your back sags into a large arch, like an old horse.  Hold this position for __________ seconds.  Repeat this until you feel limber in your low back.  Now, find your "sweet spot." This will be the most comfortable  position somewhere between the two previous positions. This is your neutral spine. Once you have found this position, tense your stomach muscles to support your low back.  Hold this position for __________ seconds. Repeat __________ times. Complete this exercise __________ times per day.  STRENGTHENING EXERCISES - Low Back Sprain These exercises may help you when beginning to rehabilitate your injury. These exercises should be done near your "sweet spot." This is the neutral, low-back arch, somewhere between fully rounded and fully arched, that is your least painful position. When performed in this safe range of motion, these exercises can be used for people who have either a flexion or extension based injury. These exercises may resolve your symptoms with or without further involvement from your physician, physical therapist or athletic trainer. While completing these exercises, remember:   Muscles can gain both the endurance and the strength needed for everyday activities through controlled exercises.  Complete these exercises as instructed  by your physician, physical therapist or athletic trainer. Increase the resistance and repetitions only as guided.  You may experience muscle soreness or fatigue, but the pain or discomfort you are trying to eliminate should never worsen during these exercises. If this pain does worsen, stop and make certain you are following the directions exactly. If the pain is still present after adjustments, discontinue the exercise until you can discuss the trouble with your caregiver. STRENGTHENING - Deep Abdominals, Pelvic Tilt   Lie on a firm bed or floor. Keeping your legs in front of you, bend your knees so they are both pointed toward the ceiling and your feet are flat on the floor.  Tense your lower abdominal muscles to press your low back into the floor. This motion will rotate your pelvis so that your tail bone is scooping upwards rather than pointing at your feet or into the floor. With a gentle tension and even breathing, hold this position for __________ seconds. Repeat __________ times. Complete this exercise __________ times per day.  STRENGTHENING - Abdominals, Crunches   Lie on a firm bed or floor. Keeping your legs in front of you, bend your knees so they are both pointed toward the ceiling and your feet are flat on the floor. Cross your arms over your chest.  Slightly tip your chin down without bending your neck.  Tense your abdominals and slowly lift your trunk high enough to just clear your shoulder blades. Lifting higher can put excessive stress on the lower back and does not further strengthen your abdominal muscles.  Control your return to the starting position. Repeat __________ times. Complete this exercise __________ times per day.  STRENGTHENING - Quadruped, Opposite UE/LE Lift   Assume a hands and knees position on a firm surface. Keep your hands under your shoulders and your knees under your hips. You may place padding under your knees for comfort.  Find your neutral spine  and gently tense your abdominal muscles so that you can maintain this position. Your shoulders and hips should form a rectangle that is parallel with the floor and is not twisted.  Keeping your trunk steady, lift your right hand no higher than your shoulder and then your left leg no higher than your hip. Make sure you are not holding your breath. Hold this position for __________ seconds.  Continuing to keep your abdominal muscles tense and your back steady, slowly return to your starting position. Repeat with the opposite arm and leg. Repeat __________ times. Complete this exercise __________ times per day.  STRENGTHENING - Abdominals and Quadriceps, Straight Leg Raise   Lie on a firm bed or floor with both legs extended in front of you.  Keeping one leg in contact with the floor, bend the other knee so that your foot can rest flat on the floor.  Find your neutral spine, and tense your abdominal muscles to maintain your spinal position throughout the exercise.  Slowly lift your straight leg off the floor about 6 inches for a count of 15, making sure to not hold your breath.  Still keeping your neutral spine, slowly lower your leg all the way to the floor. Repeat this exercise with each leg __________ times. Complete this exercise __________ times per day. POSTURE AND BODY MECHANICS CONSIDERATIONS - Low Back Sprain Keeping correct posture when sitting, standing or completing your activities will reduce the stress put on different body tissues, allowing injured tissues a chance to heal and limiting painful experiences. The following are general guidelines for improved posture. Your physician or physical therapist will provide you with any instructions specific to your needs. While reading these guidelines, remember:  The exercises prescribed by your provider will help you have the flexibility and strength to maintain correct postures.  The correct posture provides the best environment for your  joints to work. All of your joints have less wear and tear when properly supported by a spine with good posture. This means you will experience a healthier, less painful body.  Correct posture must be practiced with all of your activities, especially prolonged sitting and standing. Correct posture is as important when doing repetitive low-stress activities (typing) as it is when doing a single heavy-load activity (lifting). RESTING POSITIONS Consider which positions are most painful for you when choosing a resting position. If you have pain with flexion-based activities (sitting, bending, stooping, squatting), choose a position that allows you to rest in a less flexed posture. You would want to avoid curling into a fetal position on your side. If your pain worsens with extension-based activities (prolonged standing, working overhead), avoid resting in an extended position such as sleeping on your stomach. Most people will find more comfort when they rest with their spine in a more neutral position, neither too rounded nor too arched. Lying on a non-sagging bed on your side with a pillow between your knees, or on your back with a pillow under your knees will often provide some relief. Keep in mind, being in any one position for a prolonged period of time, no matter how correct your posture, can still lead to stiffness. PROPER SITTING POSTURE In order to minimize stress and discomfort on your spine, you must sit with correct posture. Sitting with good posture should be effortless for a healthy body. Returning to good posture is a gradual process. Many people can work toward this most comfortably by using various supports until they have the flexibility and strength to maintain this posture on their own. When sitting with proper posture, your ears will fall over your shoulders and your shoulders will fall over your hips. You should use the back of the chair to support your upper back. Your lower back will be in  a neutral position, just slightly arched. You may place a small pillow or folded towel at the base of your lower back for  support.  When working at a desk, create an environment that supports good, upright posture. Without extra support, muscles tire, which leads to excessive strain on joints and other tissues. Keep these recommendations in  mind: CHAIR:  A chair should be able to slide under your desk when your back makes contact with the back of the chair. This allows you to work closely.  The chair's height should allow your eyes to be level with the upper part of your monitor and your hands to be slightly lower than your elbows. BODY POSITION  Your feet should make contact with the floor. If this is not possible, use a foot rest.  Keep your ears over your shoulders. This will reduce stress on your neck and low back. INCORRECT SITTING POSTURES  If you are feeling tired and unable to assume a healthy sitting posture, do not slouch or slump. This puts excessive strain on your back tissues, causing more damage and pain. Healthier options include:  Using more support, like a lumbar pillow.  Switching tasks to something that requires you to be upright or walking.  Talking a brief walk.  Lying down to rest in a neutral-spine position. PROLONGED STANDING WHILE SLIGHTLY LEANING FORWARD  When completing a task that requires you to lean forward while standing in one place for a long time, place either foot up on a stationary 2-4 inch high object to help maintain the best posture. When both feet are on the ground, the lower back tends to lose its slight inward curve. If this curve flattens (or becomes too large), then the back and your other joints will experience too much stress, tire more quickly, and can cause pain. CORRECT STANDING POSTURES Proper standing posture should be assumed with all daily activities, even if they only take a few moments, like when brushing your teeth. As in sitting,  your ears should fall over your shoulders and your shoulders should fall over your hips. You should keep a slight tension in your abdominal muscles to brace your spine. Your tailbone should point down to the ground, not behind your body, resulting in an over-extended swayback posture.  INCORRECT STANDING POSTURES  Common incorrect standing postures include a forward head, locked knees and/or an excessive swayback. WALKING Walk with an upright posture. Your ears, shoulders and hips should all line-up. PROLONGED ACTIVITY IN A FLEXED POSITION When completing a task that requires you to bend forward at your waist or lean over a low surface, try to find a way to stabilize 3 out of 4 of your limbs. You can place a hand or elbow on your thigh or rest a knee on the surface you are reaching across. This will provide you more stability, so that your muscles do not tire as quickly. By keeping your knees relaxed, or slightly bent, you will also reduce stress across your lower back. CORRECT LIFTING TECHNIQUES DO :  Assume a wide stance. This will provide you more stability and the opportunity to get as close as possible to the object which you are lifting.  Tense your abdominals to brace your spine. Bend at the knees and hips. Keeping your back locked in a neutral-spine position, lift using your leg muscles. Lift with your legs, keeping your back straight.  Test the weight of unknown objects before attempting to lift them.  Try to keep your elbows locked down at your sides in order get the best strength from your shoulders when carrying an object.  Always ask for help when lifting heavy or awkward objects. INCORRECT LIFTING TECHNIQUES DO NOT:   Lock your knees when lifting, even if it is a small object.  Bend and twist. Pivot at your feet or move  your feet when needing to change directions.  Assume that you can safely pick up even a paperclip without proper posture.   This information is not intended  to replace advice given to you by your health care provider. Make sure you discuss any questions you have with your health care provider.   Document Released: 02/16/2005 Document Revised: 03/09/2014 Document Reviewed: 05/31/2008 [employer] Interactive Patient Education [employer].

## 2015-02-05 NOTE — Progress Notes (Signed)
Subjective:    [name], female    DOB: [birth year], 81 y.o.   MRN: [id number]  HPI 81 y.o. WF presents with back pain and urinary issues x 2-3 weeks. She has left sided back pain, burning. She has been taking advil that helps some. She has been having fatigue and feels like she has low grade temp, she states she has urgency, frequency and her urine has an odor to it.  She has no pain down her leg, no weakness, no radiation.   Blood pressure 140/78, pulse 65, temperature 97.5 F (36.4 C), temperature source Temporal, resp. rate 14, height 5' 4.5" (1.638 m), weight 141 lb (63.957 kg), SpO2 96 %.  Past Medical History  Diagnosis Date  . Hypothyroidism   . Internal hemorrhoids without mention of complication   . Family history of malignant neoplasm of gastrointestinal tract 05/06/2011  . Wears glasses   . Wears dentures     top  . Colon polyp 2009    BENIGN POLYPOID  . Diverticulosis of colon (without mention of hemorrhage) 2009  . Hypercholesteremia   . DJD (degenerative joint disease)   . Breast cancer [facility])     left   Current Outpatient Prescriptions on File Prior to Visit  Medication Sig Dispense Refill  . Azelastine-Fluticasone 137-50 MCG/ACT SUSP Place 1-2 puffs into the nose at bedtime. 1 Bottle 6  . Biotin 1000 MCG tablet Take 500 mcg by mouth daily.      . Calcium Carbonate-Vitamin D (CALCIUM 600+D HIGH POTENCY) 600-400 MG-UNIT per tablet Take 1 tablet by mouth daily.      . cholecalciferol (VITAMIN D) 1000 UNITS tablet Take 2,000 Units by mouth 3 (three) times daily.     . clotrimazole-betamethasone (LOTRISONE) cream Apply 1 application topically 2 (two) times daily. 45 g 2  . diazepam (VALIUM) 5 MG tablet Take 1 tablet (5 mg total) by mouth every 8 (eight) hours as needed for anxiety. 90 tablet 0  . Multiple Vitamin (ONE-A-DAY 55 PLUS PO) Take by mouth daily.    . Potassium Gluconate 595 MG CAPS Take 595 mg by mouth daily.      . sertraline (ZOLOFT) 25 MG  tablet Take 1 tablet (25 mg total) by mouth daily. 30 tablet 2  . simvastatin (ZOCOR) 20 MG tablet Take 1 tablet by mouth  every night at bedtime 90 tablet 3  . SYNTHROID 150 MCG tablet Take 1 tablet by mouth  daily 90 tablet 3  . vitamin C (ASCORBIC ACID) 500 MG tablet Take 500 mg by mouth daily.      . vitamin E (VITAMIN E) 400 UNIT capsule Take 400 Units by mouth daily.       No current facility-administered medications on file prior to visit.    Review of Systems  Constitutional: Negative.   HENT: Negative.   Respiratory: Negative.   Cardiovascular: Negative.   Gastrointestinal: Negative.   Genitourinary: Positive for urgency and frequency. Negative for dysuria, flank pain, decreased urine volume, vaginal bleeding, vaginal discharge, enuresis, genital sores, vaginal pain, menstrual problem and pelvic pain.  Musculoskeletal: Positive for back pain. Negative for arthralgias and gait problem.  Skin: Negative.  Negative for rash.  Neurological: Negative.   Psychiatric/Behavioral: Negative.        Objective:   Physical Exam  Constitutional: She is oriented to person, place, and time. She appears well-developed and well-nourished.  Neck: Normal range of motion. Neck supple.  Cardiovascular: Normal rate and regular rhythm.  Pulmonary/Chest: Effort normal and breath sounds normal.  Abdominal: Soft. Bowel sounds are normal. She exhibits no distension and no mass. There is tenderness (suprapubic). There is no rebound and no guarding.  Musculoskeletal: Normal range of motion. She exhibits no tenderness.  Patient is able to ambulate well. Gait is not  Antalgic. Straight leg raising with dorsiflexion negative bilaterally for radicular symptoms. Sensory exam in the legs are normal. Knee reflexes are normal  Ankle reflexes are normal Strength is normal and symmetric in arms and legs. There is not SI tenderness to palpation.  There is paraspinal muscle spasm.  There is midline tenderness.  ROM of  spine with  limited in all spheres due to pain.   Neurological: She is alert and oriented to person, place, and time.  Skin: Skin is warm and dry.      Assessment & Plan:  Lower back pain- negative straight leg raise She does have some mid line tenderness- mobic once daily, if not better will refer to PT/ortho  Urinary frequency with back pain Check urine- pending urine will send in ABX  Hypothyroidism Check TSH

## 2015-02-06 LAB — URINALYSIS, ROUTINE W REFLEX MICROSCOPIC
Bilirubin Urine: NEGATIVE
Glucose, UA: NEGATIVE
Hgb urine dipstick: NEGATIVE
Ketones, ur: NEGATIVE
Leukocytes, UA: NEGATIVE
Nitrite: NEGATIVE
Protein, ur: NEGATIVE
Specific Gravity, Urine: 1.007 (ref 1.001–1.035)
pH: 5.5 (ref 5.0–8.0)

## 2015-02-06 LAB — TSH: TSH: 3.257 u[IU]/mL (ref 0.350–4.500)

## 2015-02-07 LAB — URINE CULTURE
COLONY COUNT: NO GROWTH
ORGANISM ID, BACTERIA: NO GROWTH

## 2015-02-12 ENCOUNTER — Other Ambulatory Visit: Payer: Self-pay | Admitting: Physician Assistant

## 2015-02-18 ENCOUNTER — Ambulatory Visit: Payer: Self-pay | Admitting: Internal Medicine

## 2015-02-19 ENCOUNTER — Encounter: Payer: Self-pay | Admitting: Internal Medicine

## 2015-02-19 ENCOUNTER — Ambulatory Visit (INDEPENDENT_AMBULATORY_CARE_PROVIDER_SITE_OTHER): Payer: Medicare Other | Admitting: Internal Medicine

## 2015-02-19 VITALS — BP 112/58 | HR 64 | Temp 98.2°F | Resp 16 | Ht 64.5 in | Wt 135.0 lb

## 2015-02-19 DIAGNOSIS — A084 Viral intestinal infection, unspecified: Secondary | ICD-10-CM

## 2015-02-19 MED ORDER — DICYCLOMINE HCL 20 MG PO TABS: 20.0000 mg | ORAL_TABLET | Freq: Three times a day (TID) | ORAL | Status: DC

## 2015-02-19 MED ORDER — ONDANSETRON 8 MG PO TBDP: ORAL_TABLET | ORAL | Status: DC

## 2015-02-19 NOTE — Patient Instructions (Signed)
Please start taking bentyl three times a day with meals.  This will help slow your bowels down.  You can take immodium with this as needed to help with the diarrhea.  Please take zofran on an as needed basis to help with nausea and also to prevent vomiting.  Please drink as many fluids as you can to help you rehydrate.  Combinations of water and either gatorade or pedialyte help rehydrate you the fastest.  Please eat plain bland foods like bread, rice, toast, applesauce.  Things that are easily digestable.  If diarrhea does not resolve in 5 days please call the office and let us know and we will order some tests on your stool.

## 2015-02-19 NOTE — Progress Notes (Signed)
   Subjective:    [name], female    DOB: 08/30/1925, 81 y.o.   MRN: [id number]  Diarrhea  Associated symptoms include chills and vomiting. Pertinent negatives include no abdominal pain or fever.  Emesis  Associated symptoms include chills and diarrhea. Pertinent negatives include no abdominal pain or fever.   Patient presents to the office for evaluation of GI upset.  She reports that that started on Friday and started off as her stomach being unsettled and nauseated.  She reports that she vomited on Friday.  She reports that she has vomited twice during the course of the illness.  She also reports that she has been having a lot of diarrhea.  She reports that her stomach is rumbling and loud.  She reports that she has been having some diarrhea which is very watery.  She reports that there is no blood.  The stool is yellow.  She has been taking nausea medication and immodium which is not slowing things down per her report. She reports that it is interrupting her sleep.  No sick contacts.  She reports that nothing makes her feel worse.  No abx recently.  NO recent travel.     Review of Systems  Constitutional: Positive for chills and fatigue. Negative for fever.  Respiratory: Negative for chest tightness and shortness of breath.   Gastrointestinal: Positive for nausea, vomiting and diarrhea. Negative for abdominal pain, constipation, blood in stool and rectal pain.  Genitourinary: Negative for dysuria, frequency, decreased urine volume and difficulty urinating.       Objective:   Physical Exam  Constitutional: She is oriented to person, place, and time. She appears well-developed and well-nourished.  HENT:  Head: Normocephalic.  Mouth/Throat: Oropharynx is clear and moist. No oropharyngeal exudate.  Eyes: Conjunctivae are normal. No scleral icterus.  Neck: Normal range of motion. Neck supple. No JVD present. No thyromegaly present.  Cardiovascular: Normal rate, regular  rhythm, normal heart sounds and intact distal pulses.  Exam reveals no gallop and no friction rub.   No murmur heard. Pulmonary/Chest: Effort normal and breath sounds normal. No respiratory distress. She has no wheezes. She has no rales. She exhibits no tenderness.  Abdominal: Soft. Bowel sounds are normal. She exhibits no distension and no mass. There is no tenderness. There is no rebound and no guarding.  Musculoskeletal: Normal range of motion.  Lymphadenopathy:    She has no cervical adenopathy.  Neurological: She is alert and oriented to person, place, and time.  Skin: Skin is warm and dry.  Psychiatric: She has a normal mood and affect. Her behavior is normal. Judgment and thought content normal.  Nursing note and vitals reviewed.   Filed Vitals:   02/19/15 1010  BP: 112/58  Pulse: 64  Temp: 98.2 F (36.8 C)  Resp: 16          Assessment & Plan:   1. Viral gastroenteritis -bentyl -zofran -fluids -BRAT diet -f/u in office if no relief from symptoms.

## 2015-03-26 ENCOUNTER — Other Ambulatory Visit: Payer: Self-pay | Admitting: *Deleted

## 2015-03-26 MED ORDER — LEVOTHYROXINE SODIUM 150 MCG PO TABS
ORAL_TABLET | ORAL | Status: DC
Start: 2015-03-26 — End: 2016-07-21

## 2015-03-26 MED ORDER — SIMVASTATIN 20 MG PO TABS
ORAL_TABLET | ORAL | Status: DC
Start: 2015-03-26 — End: 2016-01-25

## 2015-04-08 ENCOUNTER — Other Ambulatory Visit: Payer: Self-pay

## 2015-04-08 DIAGNOSIS — Z1231 Encounter for screening mammogram for malignant neoplasm of breast: Secondary | ICD-10-CM

## 2015-04-08 DIAGNOSIS — Z853 Personal history of malignant neoplasm of breast: Secondary | ICD-10-CM

## 2015-05-16 ENCOUNTER — Ambulatory Visit
Admission: RE | Admit: 2015-05-16 | Discharge: 2015-05-16 | Disposition: A | Discharge status: Home / Self Care | Payer: Medicare Other | Source: Ambulatory Visit

## 2015-05-16 DIAGNOSIS — Z1231 Encounter for screening mammogram for malignant neoplasm of breast: Secondary | ICD-10-CM | POA: Diagnosis not present

## 2015-05-16 DIAGNOSIS — Z853 Personal history of malignant neoplasm of breast: Secondary | ICD-10-CM

## 2015-05-24 DIAGNOSIS — H25813 Combined forms of age-related cataract, bilateral: Secondary | ICD-10-CM | POA: Diagnosis not present

## 2015-05-30 DIAGNOSIS — R0781 Pleurodynia: Secondary | ICD-10-CM | POA: Diagnosis not present

## 2015-05-30 DIAGNOSIS — S299XXA Unspecified injury of thorax, initial encounter: Secondary | ICD-10-CM | POA: Diagnosis not present

## 2015-05-30 DIAGNOSIS — S2242XA Multiple fractures of ribs, left side, initial encounter for closed fracture: Secondary | ICD-10-CM | POA: Diagnosis not present

## 2015-05-30 DIAGNOSIS — S80212A Abrasion, left knee, initial encounter: Secondary | ICD-10-CM | POA: Diagnosis not present

## 2015-06-04 ENCOUNTER — Telehealth: Payer: Self-pay | Admitting: *Deleted

## 2015-06-04 MED ORDER — TRAMADOL HCL 50 MG PO TABS: ORAL_TABLET | ORAL | Status: DC

## 2015-06-04 NOTE — Telephone Encounter (Signed)
[place] and [place].  Patient requested an RX for pain be sent in.  Per Dr Melford Aase, send in an RX for Tramadol 50 mg.

## 2015-06-06 ENCOUNTER — Encounter: Payer: Self-pay | Admitting: Internal Medicine

## 2015-06-06 ENCOUNTER — Ambulatory Visit (INDEPENDENT_AMBULATORY_CARE_PROVIDER_SITE_OTHER): Payer: Medicare Other | Admitting: Internal Medicine

## 2015-06-06 VITALS — BP 122/78 | HR 64 | Temp 97.2°F | Resp 16 | Ht 64.5 in | Wt 137.8 lb

## 2015-06-06 DIAGNOSIS — S2232XD Fracture of one rib, left side, subsequent encounter for fracture with routine healing: Secondary | ICD-10-CM | POA: Diagnosis not present

## 2015-06-06 NOTE — Progress Notes (Signed)
Subjective:    [name], female    DOB: 28-Oct-1925, 82 y.o.   MRN: [id number]  HPI  This very nice and spry 82 yo [name] tripped & fell 05/28/2015 at home and 05/30/2015 was evaluated at an Urgent Care in [facility] and was found to have Fx' s of the Left  6th & 7th ribs. Other than CP , she has done well w/o complaints of dyspnea, cough or congestion. No other injuries have become apparent in the interim. Tramadol seems to be controlling her pains.   Medication Sig  . Azelastine-Fluticasone 137-50  Place 1-2 puffs into the nose at bedtime.  . Biotin 1000 MCG tablet Take 500 mcg by mouth daily.    . Calcium -Vitamin D 600-400  Take 1 tablet by mouth daily.    Marland Kitchen VITAMIN D 1000 UNITS tablet Take 2,000 Units by mouth 3 (three) times daily.   Marland Kitchen LOTRISONE cream Apply 1 application topically 2 (two) times daily.  . diazepam  5 MG tablet Take 1 tablet (5 mg total) by mouth every 8 (eight) hours as needed for anxiety.  . dicyclomine  20 MG tablet Take 1 tablet (20 mg total) by mouth 3 (three) times daily before meals.  Marland Kitchen levothyroxine  150 MCG tablet Take 1 tablet by mouth  daily  . meloxicam  15 MG tablet Take one daily with food for 2 weeks, can take with tylenol, can not take with aleve, iburpofen, then as needed daily for pain  . Multiple Vitamin  Take by mouth daily.  . ondansetron  8 MG sl 8mg  ODT q8 hours prn nausea  . Potassium  595 MG CAPS Take 595 mg by mouth daily.    . sertraline  25 MG tablet Take 1 tablet (25 mg total) by mouth daily.  . simvastatin 20 MG tablet Take 1 tablet by mouth  every night at bedtime  . traMADol  50 MG tablet Take 1 tablet qid for pain.  . vitamin C  500 MG tablet Take 500 mg by mouth daily.    . vitamin E  400 UNIT capsule Take 400 Units by mouth daily.     Allergies  Allergen Reactions  . Ciprofloxacin   . Epinephrine Other (See Comments)    Heart raced   . Etodolac Hives  . Lidocaine   . Synephrine     Palpitations   . Flagyl  [Metronidazole Hcl] Rash   Past Medical History  Diagnosis Date  . Hypothyroidism   . Internal hemorrhoids without mention of complication   . Family history of malignant neoplasm of gastrointestinal tract 05/06/2011  . Wears glasses   . Wears dentures     top  . Colon polyp 2009    BENIGN POLYPOID  . Diverticulosis of colon (without mention of hemorrhage) 2009  . Hypercholesteremia   . DJD (degenerative joint disease)   . Breast cancer [facility])     left   Past Surgical History  Procedure Laterality Date  . Abdominal hysterectomy    . Bladder tac    . Bunionectomy      bilateral  . Right shoulder  2003    bone spur removed-rcr  . Wrist fracture surgery  2003    left-lipoma  . Breast lumpectomy  2004    left lump-snbx  . Cystocele repair  2009  . Tonsillectomy    . Knee arthroscopy Left 01/17/2013    Procedure: ARTHROSCOPY LEFT KNEE;  Surgeon: Hessie Dibble, MD;  Location: [place];  Service: Orthopedics;  Laterality: Left;  partial medial and partial lateral chondroplasty and removal loose body  . Colonoscopy  2009    several   Review of Systems  10 point systems review negative except as above.    Objective:   Physical Exam  BP 122/78   Pulse 64  Temp 97.2 F   Resp 16  Ht 5' 4.5"   Wt 137 lb 12.8 oz   BMI 23.30    O2 sats at 98%.  HEENT - Eac's patent. TM's Nl. EOM's full. PERRLA. NasoOroPharynx clear. Neck - supple. Nl Thyroid. Carotids 2+ & No bruits, nodes, JVD Chest - Clear equal BS w/o Rales, rhonchi, wheezes. Tender Left Chest. No splinting.  Cor - Nl HS. RRR w/o sig MGR. PP 1(+). No edema. Abd - No palpable organomegaly, masses or tenderness. BS nl. MS- FROM w/o deformities. Muscle power, tone and bulk Nl. Gait Nl. Neuro - No obvious Cr N abnormalities. Sensory, motor and Cerebellar functions appear Nl w/o focal abnormalities.    Assessment & Plan:   1. Fracture of rib of left side, with routine healing, subsequent encounter  -  Discussed continue meds same.

## 2015-06-11 ENCOUNTER — Other Ambulatory Visit: Payer: Self-pay | Admitting: Internal Medicine

## 2015-06-11 ENCOUNTER — Telehealth: Payer: Self-pay | Admitting: *Deleted

## 2015-06-11 MED ORDER — MIRTAZAPINE 30 MG PO TABS: 30.0000 mg | ORAL_TABLET | Freq: Every day | ORAL | Status: DC

## 2015-06-11 NOTE — Telephone Encounter (Signed)
Patient called and states she has lost weight and foods make her sick at her stomach.  Patient states she stopped the Tramadol due to side effects.  Per Dr Melford Aase, send in an RX for Remeron to stimulate her appetite and patient informed to start with 1/2 tab at bedtime x 8 days and then increase to 1 tab at bedtime.  Patient is aware.

## 2015-06-20 ENCOUNTER — Telehealth: Payer: Self-pay | Admitting: *Deleted

## 2015-06-20 NOTE — Telephone Encounter (Signed)
Patient called and requested an RX for a knee brace due to the fall she had a few weeks ago.  RX sent to patient pick up per Dr Melford Aase.

## 2015-07-01 DIAGNOSIS — H2512 Age-related nuclear cataract, left eye: Secondary | ICD-10-CM | POA: Diagnosis not present

## 2015-07-01 DIAGNOSIS — H25812 Combined forms of age-related cataract, left eye: Secondary | ICD-10-CM | POA: Diagnosis not present

## 2015-07-12 DIAGNOSIS — M19072 Primary osteoarthritis, left ankle and foot: Secondary | ICD-10-CM | POA: Diagnosis not present

## 2015-07-12 DIAGNOSIS — M1712 Unilateral primary osteoarthritis, left knee: Secondary | ICD-10-CM | POA: Diagnosis not present

## 2015-07-23 DIAGNOSIS — H2511 Age-related nuclear cataract, right eye: Secondary | ICD-10-CM | POA: Diagnosis not present

## 2015-07-29 DIAGNOSIS — H25811 Combined forms of age-related cataract, right eye: Secondary | ICD-10-CM | POA: Diagnosis not present

## 2015-07-29 DIAGNOSIS — H2511 Age-related nuclear cataract, right eye: Secondary | ICD-10-CM | POA: Diagnosis not present

## 2015-08-01 ENCOUNTER — Telehealth: Payer: Self-pay | Admitting: Physician Assistant

## 2015-08-01 NOTE — Telephone Encounter (Signed)
Patient seen in Dec for diarrhea, instructed to follow up if not improved. Asking for medication for IBS and has had weight loss, patient instructed to come in for an office visit due to weight loss for evaluation. Can try lomital in the mean time but needs OV.

## 2015-08-01 NOTE — Telephone Encounter (Signed)
Spoke with about "kidimea" & she is unsure about how to spell it as well. Pt states she has been taking lomital & it is not really working. Pt states that she will just wait until Aug to come about diarrhea because she has been dealing with it for a few mths now & can handle it until then.

## 2015-08-09 DIAGNOSIS — M1711 Unilateral primary osteoarthritis, right knee: Secondary | ICD-10-CM | POA: Diagnosis not present

## 2015-08-09 DIAGNOSIS — M1712 Unilateral primary osteoarthritis, left knee: Secondary | ICD-10-CM | POA: Diagnosis not present

## 2015-09-04 DIAGNOSIS — M1711 Unilateral primary osteoarthritis, right knee: Secondary | ICD-10-CM | POA: Diagnosis not present

## 2015-10-02 DIAGNOSIS — M1711 Unilateral primary osteoarthritis, right knee: Secondary | ICD-10-CM | POA: Diagnosis not present

## 2015-10-10 ENCOUNTER — Other Ambulatory Visit: Payer: Self-pay | Admitting: Orthopaedic Surgery

## 2015-10-22 ENCOUNTER — Ambulatory Visit (INDEPENDENT_AMBULATORY_CARE_PROVIDER_SITE_OTHER): Payer: Medicare Other | Admitting: Internal Medicine

## 2015-10-22 ENCOUNTER — Encounter: Payer: Self-pay | Admitting: Physician Assistant

## 2015-10-22 VITALS — BP 136/64 | HR 58 | Temp 98.0°F | Resp 16 | Ht 64.5 in | Wt 134.0 lb

## 2015-10-22 DIAGNOSIS — F411 Generalized anxiety disorder: Secondary | ICD-10-CM | POA: Diagnosis not present

## 2015-10-22 DIAGNOSIS — Z79899 Other long term (current) drug therapy: Secondary | ICD-10-CM | POA: Diagnosis not present

## 2015-10-22 DIAGNOSIS — R030 Elevated blood-pressure reading, without diagnosis of hypertension: Secondary | ICD-10-CM | POA: Diagnosis not present

## 2015-10-22 DIAGNOSIS — R7309 Other abnormal glucose: Secondary | ICD-10-CM | POA: Diagnosis not present

## 2015-10-22 DIAGNOSIS — Z0001 Encounter for general adult medical examination with abnormal findings: Secondary | ICD-10-CM

## 2015-10-22 DIAGNOSIS — E039 Hypothyroidism, unspecified: Secondary | ICD-10-CM | POA: Diagnosis not present

## 2015-10-22 DIAGNOSIS — IMO0001 Reserved for inherently not codable concepts without codable children: Secondary | ICD-10-CM

## 2015-10-22 DIAGNOSIS — E782 Mixed hyperlipidemia: Secondary | ICD-10-CM | POA: Diagnosis not present

## 2015-10-22 DIAGNOSIS — E559 Vitamin D deficiency, unspecified: Secondary | ICD-10-CM

## 2015-10-22 DIAGNOSIS — M1990 Unspecified osteoarthritis, unspecified site: Secondary | ICD-10-CM | POA: Diagnosis not present

## 2015-10-22 DIAGNOSIS — R6889 Other general symptoms and signs: Secondary | ICD-10-CM | POA: Diagnosis not present

## 2015-10-22 LAB — CBC WITH DIFFERENTIAL/PLATELET
Basophils Absolute: 0 cells/uL (ref 0–200)
Basophils Relative: 0 %
EOS ABS: 124 {cells}/uL (ref 15–500)
Eosinophils Relative: 2 %
HEMATOCRIT: 39.4 % (ref 35.0–45.0)
HEMOGLOBIN: 12.9 g/dL (ref 11.7–15.5)
LYMPHS ABS: 1674 {cells}/uL (ref 850–3900)
LYMPHS PCT: 27 %
MCH: 29.5 pg (ref 27.0–33.0)
MCHC: 32.7 g/dL (ref 32.0–36.0)
MCV: 90.2 fL (ref 80.0–100.0)
MONO ABS: 558 {cells}/uL (ref 200–950)
MPV: 9.6 fL (ref 7.5–12.5)
Monocytes Relative: 9 %
Neutro Abs: 3844 cells/uL (ref 1500–7800)
Neutrophils Relative %: 62 %
Platelets: 229 10*3/uL (ref 140–400)
RBC: 4.37 MIL/uL (ref 3.80–5.10)
RDW: 13.8 % (ref 11.0–15.0)
WBC: 6.2 10*3/uL (ref 3.8–10.8)

## 2015-10-22 LAB — BASIC METABOLIC PANEL WITH GFR
BUN: 16 mg/dL (ref 7–25)
CHLORIDE: 104 mmol/L (ref 98–110)
CO2: 27 mmol/L (ref 20–31)
Calcium: 9.6 mg/dL (ref 8.6–10.4)
Creat: 0.78 mg/dL (ref 0.60–0.88)
GFR, Est African American: 78 mL/min (ref >=60)
GFR, Est Non African American: 68 mL/min (ref >=60)
GLUCOSE: 82 mg/dL (ref 65–99)
POTASSIUM: 4.5 mmol/L (ref 3.5–5.3)
Sodium: 140 mmol/L (ref 135–146)

## 2015-10-22 LAB — HEPATIC FUNCTION PANEL
ALBUMIN: 4 g/dL (ref 3.6–5.1)
ALK PHOS: 55 U/L (ref 33–130)
ALT: 12 U/L (ref 6–29)
AST: 16 U/L (ref 10–35)
Bilirubin, Direct: 0.2 mg/dL (ref <=0.2)
Indirect Bilirubin: 1 mg/dL (ref 0.2–1.2)
TOTAL PROTEIN: 6.3 g/dL (ref 6.1–8.1)
Total Bilirubin: 1.2 mg/dL (ref 0.2–1.2)

## 2015-10-22 LAB — LIPID PANEL
Cholesterol: 161 mg/dL (ref 125–200)
HDL: 65 mg/dL (ref >=46)
LDL CALC: 67 mg/dL (ref <130)
Total CHOL/HDL Ratio: 2.5 Ratio (ref <=5.0)
Triglycerides: 143 mg/dL (ref <150)
VLDL: 29 mg/dL (ref <30)

## 2015-10-22 LAB — MAGNESIUM: Magnesium: 1.9 mg/dL (ref 1.5–2.5)

## 2015-10-22 MED ORDER — DIAZEPAM 5 MG PO TABS: 5.0000 mg | ORAL_TABLET | Freq: Three times a day (TID) | ORAL | 0 refills | Status: DC | PRN

## 2015-10-22 NOTE — Progress Notes (Signed)
Complete Physical  Assessment and Plan:   1. Encounter for general adult medical examination with abnormal findings  - CBC with Differential/Platelet - BASIC METABOLIC PANEL WITH GFR - Hepatic function panel - Magnesium  2. Elevated BP  - Urinalysis, Routine w reflex microscopic (not at [facility]) - Microalbumin / creatinine urine ratio - EKG 12-Lead - TSH  3. Hypothyroidism, unspecified hypothyroidism type -TSH -cont synthroid  4. Mixed hyperlipidemia -cont meds - Lipid panel  5. Vitamin D deficiency -cont Vit D - VITAMIN D 25 Hydroxy (Vit-D Deficiency, Fractures)  6. Medication management -cont biyearly labs  7. Osteoarthritis, unspecified osteoarthritis type, unspecified site -declines further DEXA -patient will cont Vit D and calcium  8. Generalized anxiety disorder -cont meds  9. Other abnormal glucose  - Hemoglobin A1c - Insulin, random  Discussed med's effects and SE's. Screening labs and tests as requested with regular follow-up as recommended.  HPI  82 y.o. female  presents for a complete physical.  Her blood pressure has been controlled at home, today their BP is BP: 136/64.  She does not workout. She denies chest pain, shortness of breath, dizziness.  She reports that she is not able to exercise due to severe OA in her knee.    She is on cholesterol medication and denies myalgias. Her cholesterol is at goal. The cholesterol last visit was:  Lab Results  Component Value Date   CHOL 178 10/22/2014   HDL 61 10/22/2014   LDLCALC 89 10/22/2014   TRIG 141 10/22/2014   CHOLHDL 2.9 10/22/2014  .  She has been working on diet and exercise for prediabetes, she is not on bASA, she is on ACE/ARB and denies foot ulcerations, hyperglycemia, hypoglycemia , increased appetite, nausea, paresthesia of the feet, polydipsia, polyuria, visual disturbances, vomiting and weight loss. Last A1C in the office was:  Lab Results  Component Value Date   HGBA1C 5.5  02/14/2014    Patient is on Vitamin D supplement.   Lab Results  Component Value Date   VD25OH 13 10/22/2014     She reports that she is scheduled for a knee replacement.  She reports that Dr. Rhona Raider will be doing this surgery.    She reports that she had cataract surgery recently and feels like her eyes are doing well.  She reports that she now only needs her glasses for reading.    She is taking a 1/2 tablet of synthroid.  She reports that sometimes she feels like it gets stuck.    Dictation #1 [id number]  [id number]   Current Medications:  Current Outpatient Prescriptions on File Prior to Visit  Medication Sig Dispense Refill  . Azelastine-Fluticasone 137-50 MCG/ACT SUSP Place 1-2 puffs into the nose at bedtime. 1 Bottle 6  . Calcium Carbonate-Vitamin D (CALCIUM 600+D HIGH POTENCY) 600-400 MG-UNIT per tablet Take 1 tablet by mouth daily.      . cholecalciferol (VITAMIN D) 1000 UNITS tablet Take 2,000 Units by mouth 3 (three) times daily.     . clotrimazole-betamethasone (LOTRISONE) cream Apply 1 application topically 2 (two) times daily. 45 g 2  . diazepam (VALIUM) 5 MG tablet Take 1 tablet (5 mg total) by mouth every 8 (eight) hours as needed for anxiety. 60 tablet 0  . levothyroxine (SYNTHROID) 150 MCG tablet Take 1 tablet by mouth  daily 90 tablet 3  . meloxicam (MOBIC) 15 MG tablet Take one daily with food for 2 weeks, can take with tylenol, can not take with aleve, iburpofen, then as  needed daily for pain 30 tablet 1  . mirtazapine (REMERON) 30 MG tablet Take 1 tablet (30 mg total) by mouth at bedtime. As directed. 30 tablet 2  . Potassium Gluconate 595 MG CAPS Take 595 mg by mouth daily.      . simvastatin (ZOCOR) 20 MG tablet Take 1 tablet by mouth  every night at bedtime 90 tablet 3  . vitamin C (ASCORBIC ACID) 500 MG tablet Take 500 mg by mouth daily.      . vitamin E (VITAMIN E) 400 UNIT capsule Take 400 Units by mouth daily.       No current  facility-administered medications on file prior to visit.     Health Maintenance:   Immunization History  Administered Date(s) Administered  . Influenza Split 12/01/2010  . Influenza-Unspecified 10/31/2012, 11/29/2014  . Pneumococcal Conjugate-13 10/18/2013  . Pneumococcal Polysaccharide-23 03/20/2010  . Td 10/17/2012    Tetanus: 2014 Pneumovax: 2012 Prevnar: 2015 Flu vaccine: Declined Zostavax:Declined due to cost [id number] MGM:3/17 DEXA: 2011, declined Colonoscopy: 2009, aged out Last Eye Exam: Cataract surgery performed this year   Patient Care Team: Unk Pinto, MD as PCP - General (Internal Medicine) Melrose Nakayama, MD as Consulting Physician (Orthopedic Surgery) Deneise Lever, MD as Consulting Physician (Pulmonary Disease) Inda Castle, MD as Consulting Physician (Gastroenterology) Carolan Clines, MD as Consulting Physician (Urology)  Allergies:  Allergies  Allergen Reactions  . Ciprofloxacin   . Epinephrine Other (See Comments)    Heart raced   . Etodolac Hives  . Lidocaine   . Synephrine     Palpitations   . Flagyl [Metronidazole Hcl] Rash    Medical History:  Past Medical History:  Diagnosis Date  . Breast cancer ([place])    left  . Colon polyp 2009   BENIGN POLYPOID  . Diverticulosis of colon (without mention of hemorrhage) 2009  . DJD (degenerative joint disease)   . Family history of malignant neoplasm of gastrointestinal tract 05/06/2011  . Hypercholesteremia   . Hypothyroidism   . Internal hemorrhoids without mention of complication   . Wears dentures    top  . Wears glasses     Surgical History:  Past Surgical History:  Procedure Laterality Date  . ABDOMINAL HYSTERECTOMY    . bladder tac    . BREAST LUMPECTOMY  2004   left lump-snbx  . BUNIONECTOMY     bilateral  . COLONOSCOPY  2009   several  . CYSTOCELE REPAIR  2009  . KNEE ARTHROSCOPY Left 01/17/2013   Procedure: ARTHROSCOPY LEFT KNEE;   Surgeon: Hessie Dibble, MD;  Location: [place];  Service: Orthopedics;  Laterality: Left;  partial medial and partial lateral chondroplasty and removal loose body  . right shoulder  2003   bone spur removed-rcr  . TONSILLECTOMY    . WRIST FRACTURE SURGERY  2003   left-lipoma    Family History:  Family History  Problem Relation Age of Onset  . Breast cancer Sister     x 2  . Colon cancer Son   . Heart disease Mother     died at age 24  . Cancer Sister 21    breast  . Cancer Sister 79    breast cancer    Social History:  Social History  Substance Use Topics  . Smoking status: Never Smoker  . Smokeless tobacco: Never Used  . Alcohol use Yes     Comment: ocassionally    Review of Systems: Review of Systems  Constitutional: Negative for chills, fever and malaise/fatigue.  HENT: Negative for congestion, ear pain and sore throat.   Eyes: Negative.   Respiratory: Negative for cough, shortness of breath and wheezing.   Cardiovascular: Negative for chest pain, palpitations and leg swelling.  Gastrointestinal: Negative for abdominal pain, blood in stool, constipation, diarrhea, heartburn and melena.  Genitourinary: Negative.   Skin: Negative.   Neurological: Negative for dizziness, sensory change, loss of consciousness and headaches.  Psychiatric/Behavioral: Negative for depression. The patient is not nervous/anxious and does not have insomnia.     Physical Exam: Estimated body mass index is 22.65 kg/m as calculated from the following:   Height as of this encounter: 5' 4.5" (1.638 m).   Weight as of this encounter: 134 lb (60.8 kg). BP 136/64   Pulse (!) 58   Temp 98 F (36.7 C) (Temporal)   Resp 16   Ht 5' 4.5" (1.638 m)   Wt 134 lb (60.8 kg)   BMI 22.65 kg/m   General Appearance: Well nourished well developed, in no apparent distress.  Eyes: PERRLA, EOMs, conjunctiva no swelling or erythema ENT/Mouth: Ear canals normal without obstruction,  swelling, erythema, or discharge.  TMs normal bilaterally with no erythema, bulging, retraction, or loss of landmark.  Oropharynx moist and clear with no exudate, erythema, or swelling.   Neck: Supple, thyroid normal. No bruits.  No cervical adenopathy Respiratory: Respiratory effort normal, Breath sounds clear A&P without wheeze, rhonchi, rales.   Cardio: RRR without murmurs, rubs or gallops. Brisk peripheral pulses without edema.  Chest: symmetric, with normal excursions Breasts: Symmetric, without lumps, nipple discharge, retractions.  Abdomen: Soft, nontender, no guarding, rebound, hernias, masses, or organomegaly.  Lymphatics: Non tender without lymphadenopathy.  Genitourinary:  Musculoskeletal: Full ROM all peripheral extremities,5/5 strength, and normal gait.  Skin: Warm, dry without rashes, lesions, ecchymosis. Neuro: Awake and oriented X 3, Cranial nerves intact, reflexes equal bilaterally. Normal muscle tone, no cerebellar symptoms. Sensation intact.  Psych:  normal affect, Insight and Judgment appropriate.   EKG: WNL no changes.  Over 40 minutes of exam, counseling, chart review and critical decision making was performed  Loma Sousa Forcucci 10:25 AM [facility]

## 2015-10-23 ENCOUNTER — Encounter: Payer: Self-pay | Admitting: *Deleted

## 2015-10-23 LAB — URINALYSIS, ROUTINE W REFLEX MICROSCOPIC
BILIRUBIN URINE: NEGATIVE
GLUCOSE, UA: NEGATIVE
Hgb urine dipstick: NEGATIVE
KETONES UR: NEGATIVE
Leukocytes, UA: NEGATIVE
Nitrite: NEGATIVE
PROTEIN: NEGATIVE
Specific Gravity, Urine: 1.02 (ref 1.001–1.035)
pH: 5.5 (ref 5.0–8.0)

## 2015-10-23 LAB — HEMOGLOBIN A1C
HEMOGLOBIN A1C: 5.1 % (ref <5.7)
Mean Plasma Glucose: 100 mg/dL

## 2015-10-23 LAB — TSH: TSH: 1.9 mIU/L

## 2015-10-23 LAB — MICROALBUMIN / CREATININE URINE RATIO
CREATININE, URINE: 178 mg/dL (ref 20–320)
Microalb Creat Ratio: 7 mcg/mg creat (ref <30)
Microalb, Ur: 1.3 mg/dL

## 2015-10-23 LAB — INSULIN, RANDOM: INSULIN: 5.5 u[IU]/mL (ref 2.0–19.6)

## 2015-10-23 LAB — VITAMIN D 25 HYDROXY (VIT D DEFICIENCY, FRACTURES): Vit D, 25-Hydroxy: 50 ng/mL (ref 30–100)

## 2015-11-11 DIAGNOSIS — M1711 Unilateral primary osteoarthritis, right knee: Secondary | ICD-10-CM | POA: Diagnosis not present

## 2015-11-11 DIAGNOSIS — M1712 Unilateral primary osteoarthritis, left knee: Secondary | ICD-10-CM | POA: Diagnosis not present

## 2015-11-12 ENCOUNTER — Ambulatory Visit (INDEPENDENT_AMBULATORY_CARE_PROVIDER_SITE_OTHER): Payer: Medicare Other

## 2015-11-12 DIAGNOSIS — Z23 Encounter for immunization: Secondary | ICD-10-CM

## 2015-11-12 NOTE — Progress Notes (Signed)
Pt presents for HD FLU vaccine. Injection was given in right arm w/ no questions or concerns.

## 2015-11-14 DIAGNOSIS — H26493 Other secondary cataract, bilateral: Secondary | ICD-10-CM | POA: Diagnosis not present

## 2015-11-14 DIAGNOSIS — Z961 Presence of intraocular lens: Secondary | ICD-10-CM | POA: Diagnosis not present

## 2015-11-14 NOTE — Pre-Procedure Instructions (Signed)
Alexandria Davies  11/14/2015     Your procedure is scheduled on : Tuesday November 26, 2015 at 10:10 AM.  Report to [facility] Admitting at 8:10 AM.  Call this number if you have problems the morning of surgery: [phone]    Remember:  Do not eat food or drink liquids after midnight.  Take these medicines the morning of surgery with A SIP OF WATER : Diazepam (Valium) if needed, Levothyroxine (Synthroid)   Stop taking any vitamins, herbal medications/supplements, NSAIDs, Ibuprofen, Advil, Motrin, Aleve, etc on Tuesday September 19th   Do not wear jewelry, make-up or nail polish.  Do not wear lotions, powders, or perfumes, or deoderant.  Do not shave 48 hours prior to surgery.    Do not bring valuables to the hospital.  [facility] is not responsible for any belongings or valuables.  Contacts, dentures or bridgework may not be worn into surgery.  Leave your suitcase in the car.  After surgery it may be brought to your room.  For patients admitted to the hospital, discharge time will be determined by your treatment team.  Patients discharged the day of surgery will not be allowed to drive home.   Name and phone number of your driver:    Special instructions:  Shower using CHG soap the night before and the morning of your surgery  Please read over the following fact sheets that you were given. MRSA Information

## 2015-11-15 ENCOUNTER — Encounter (HOSPITAL_COMMUNITY): Payer: Self-pay

## 2015-11-15 ENCOUNTER — Encounter (HOSPITAL_COMMUNITY)
Admission: RE | Admit: 2015-11-15 | Discharge: 2015-11-15 | Disposition: A | Discharge status: Home / Self Care | Payer: Medicare Other | Source: Ambulatory Visit | Attending: Orthopaedic Surgery | Admitting: Orthopaedic Surgery

## 2015-11-15 ENCOUNTER — Ambulatory Visit (HOSPITAL_COMMUNITY)
Admission: RE | Admit: 2015-11-15 | Discharge: 2015-11-15 | Disposition: A | Discharge status: Home / Self Care | Payer: Medicare Other | Source: Ambulatory Visit | Attending: Orthopaedic Surgery | Admitting: Orthopaedic Surgery

## 2015-11-15 DIAGNOSIS — Z01818 Encounter for other preprocedural examination: Secondary | ICD-10-CM | POA: Insufficient documentation

## 2015-11-15 HISTORY — DX: Irritable bowel syndrome, unspecified: K58.9

## 2015-11-15 HISTORY — DX: Other specified disorders of nose and nasal sinuses: J34.89

## 2015-11-15 HISTORY — DX: Vitiligo: L80

## 2015-11-15 HISTORY — DX: Unspecified hearing loss, unspecified ear: H91.90

## 2015-11-15 HISTORY — DX: Anxiety disorder, unspecified: F41.9

## 2015-11-15 LAB — CBC WITH DIFFERENTIAL/PLATELET
BASOS ABS: 0 10*3/uL (ref 0.0–0.1)
BASOS PCT: 0 %
EOS PCT: 2 %
Eosinophils Absolute: 0.1 10*3/uL (ref 0.0–0.7)
HEMATOCRIT: 41.1 % (ref 36.0–46.0)
Hemoglobin: 13.4 g/dL (ref 12.0–15.0)
Lymphocytes Relative: 28 %
Lymphs Abs: 2.1 10*3/uL (ref 0.7–4.0)
MCH: 29.8 pg (ref 26.0–34.0)
MCHC: 32.6 g/dL (ref 30.0–36.0)
MCV: 91.3 fL (ref 78.0–100.0)
MONO ABS: 0.6 10*3/uL (ref 0.1–1.0)
MONOS PCT: 7 %
Neutro Abs: 4.8 10*3/uL (ref 1.7–7.7)
Neutrophils Relative %: 63 %
PLATELETS: 247 10*3/uL (ref 150–400)
RBC: 4.5 MIL/uL (ref 3.87–5.11)
RDW: 13.2 % (ref 11.5–15.5)
WBC: 7.7 10*3/uL (ref 4.0–10.5)

## 2015-11-15 LAB — TYPE AND SCREEN
ABO/RH(D): O POS
Antibody Screen: NEGATIVE

## 2015-11-15 LAB — URINALYSIS, ROUTINE W REFLEX MICROSCOPIC
Bilirubin Urine: NEGATIVE
GLUCOSE, UA: NEGATIVE mg/dL
HGB URINE DIPSTICK: NEGATIVE
Ketones, ur: NEGATIVE mg/dL
LEUKOCYTES UA: NEGATIVE
Nitrite: NEGATIVE
PROTEIN: NEGATIVE mg/dL
SPECIFIC GRAVITY, URINE: 1.008 (ref 1.005–1.030)
pH: 5.5 (ref 5.0–8.0)

## 2015-11-15 LAB — BASIC METABOLIC PANEL
ANION GAP: 8 (ref 5–15)
BUN: 12 mg/dL (ref 6–20)
CALCIUM: 9.9 mg/dL (ref 8.9–10.3)
CO2: 26 mmol/L (ref 22–32)
Chloride: 105 mmol/L (ref 101–111)
Creatinine, Ser: 0.84 mg/dL (ref 0.44–1.00)
GFR calc Af Amer: >60 mL/min (ref >60)
GLUCOSE: 98 mg/dL (ref 65–99)
Potassium: 3.8 mmol/L (ref 3.5–5.1)
Sodium: 139 mmol/L (ref 135–145)

## 2015-11-15 LAB — PROTIME-INR
INR: 1
Prothrombin Time: 13.2 seconds (ref 11.4–15.2)

## 2015-11-15 LAB — ABO/RH: ABO/RH(D): O POS

## 2015-11-15 LAB — APTT: APTT: 32 s (ref 24–36)

## 2015-11-15 LAB — SURGICAL PCR SCREEN
MRSA, PCR: NEGATIVE
Staphylococcus aureus: NEGATIVE

## 2015-11-19 ENCOUNTER — Ambulatory Visit: Payer: Self-pay

## 2015-11-25 NOTE — Progress Notes (Signed)
Pt notified of new arrival time of 10:45. Not happy with change but will be here.

## 2015-11-25 NOTE — H&P (Signed)
TOTAL KNEE ADMISSION H&P  Patient is being admitted for right total knee arthroplasty.  Subjective:  Chief Complaint:right knee pain.  HPI: Alexandria Davies, 82 y.o. female, has a history of pain and functional disability in the right knee due to arthritis and has failed non-surgical conservative treatments for greater than 12 weeks to includeNSAID's and/or analgesics, corticosteriod injections, flexibility and strengthening excercises, use of assistive devices, weight reduction as appropriate and activity modification.  Onset of symptoms was gradual, starting 5 years ago with gradually worsening course since that time. The patient noted no past surgery on the right knee(s).  Patient currently rates pain in the right knee(s) at 10 out of 10 with activity. Patient has night pain, worsening of pain with activity and weight bearing, pain that interferes with activities of daily living, pain with passive range of motion, crepitus and joint swelling.  Patient has evidence of subchondral cysts, subchondral sclerosis, periarticular osteophytes, joint subluxation and joint space narrowing by imaging studies.There is no active infection.  Patient Active Problem List   Diagnosis Date Noted  . Insomnia 10/22/2014  . Generalized anxiety disorder 10/22/2014  . Elevated BP 02/14/2014  . Mixed hyperlipidemia 02/14/2014  . Vitamin D deficiency 02/14/2014  . Medication management 02/14/2014  . Rhinitis, allergic 08/19/2011  . Diverticulosis 04/23/2011  . Colon polyps 04/23/2011  . PERSONAL HISTORY OF MALIGNANT NEOPLASM OF BREAST 12/07/2007  . Hypothyroidism 12/05/2007  . Osteoarthritis 12/05/2007  . Internal hemorrhoids 11/08/2002   Past Medical History:  Diagnosis Date  . Anxiety   . Breast cancer ([place])    left  . Colon polyp 2009   BENIGN POLYPOID  . Diverticulosis of colon (without mention of hemorrhage) 2009  . DJD (degenerative joint disease)   . Family history of malignant neoplasm of  gastrointestinal tract 05/06/2011  . HOH (hard of hearing)    Right ear  . Hypercholesteremia   . Hypothyroidism   . IBS (irritable bowel syndrome)   . Internal hemorrhoids without mention of complication   . Sinus drainage   . Vitiligo   . Wears dentures    top  . Wears glasses     Past Surgical History:  Procedure Laterality Date  . ABDOMINAL HYSTERECTOMY    . bladder tac    . BREAST LUMPECTOMY  2004   left lump-snbx  . BUNIONECTOMY     bilateral  . COLONOSCOPY  2009   several  . CYSTOCELE REPAIR  2009  . EYE SURGERY Bilateral    Cataract removal  . KNEE ARTHROSCOPY Left 01/17/2013   Procedure: ARTHROSCOPY LEFT KNEE;  Surgeon: Hessie Dibble, MD;  Location: [facility];  Service: Orthopedics;  Laterality: Left;  partial medial and partial lateral chondroplasty and removal loose body  . right shoulder  2003   bone spur removed-rcr  . TONSILLECTOMY    . WRIST FRACTURE SURGERY  2003   left-lipoma    No prescriptions prior to admission.   Allergies  Allergen Reactions  . Ciprofloxacin   . Epinephrine Other (See Comments)    Heart raced   . Etodolac Hives  . Lidocaine   . Synephrine     Palpitations   . Flagyl [Metronidazole Hcl] Rash    Social History  Substance Use Topics  . Smoking status: Never Smoker  . Smokeless tobacco: Never Used  . Alcohol use Yes     Comment: ocassionally    Family History  Problem Relation Age of Onset  . Breast cancer Sister  x 2  . Cancer Sister 58    breast  . Cancer Sister 57    breast cancer  . Colon cancer Son   . Heart disease Mother     died at age 23     Review of Systems  Musculoskeletal: Positive for joint pain.       Right knee  All other systems reviewed and are negative.   Objective:  Physical Exam  Constitutional: Alexandria Davies is oriented to person, place, and time. Alexandria Davies appears well-developed and well-nourished.  HENT:  Head: Normocephalic and atraumatic.  Eyes: Pupils are equal, round, and  reactive to light.  Neck: Normal range of motion.  Cardiovascular: Normal rate and regular rhythm.   Respiratory: Effort normal.  GI: Soft.  Musculoskeletal:  Right knee motion is still about 5-110. Alexandria Davies has crepitation and medial joint line pain. There is no effusion on either side. Hip motion is good on both sides. Opposite knee is little tender along the medial joint line with greater motion. Ligaments are stable on both sides. Sensation is intact in her feet with palpable pulses at both ankles.   Neurological: Alexandria Davies is alert and oriented to person, place, and time.  Skin: Skin is warm and dry.  Psychiatric: Alexandria Davies has a normal mood and affect. Her behavior is normal. Judgment and thought content normal.    Vital signs in last 24 hours:    Labs:   Estimated body mass index is 23.07 kg/m as calculated from the following:   Height as of 11/15/15: 5\' 4"  (1.626 m).   Weight as of 11/15/15: 61 kg (134 lb 6.4 oz).   Imaging Review Plain radiographs demonstrate severe degenerative joint disease of the right knee(s). The overall alignment isneutral. The bone quality appears to be good for age and reported activity level.  Assessment/Plan:  End stage primary arthritis, right knee   The patient history, physical examination, clinical judgment of the provider and imaging studies are consistent with end stage degenerative joint disease of the right knee(s) and total knee arthroplasty is deemed medically necessary. The treatment options including medical management, injection therapy arthroscopy and arthroplasty were discussed at length. The risks and benefits of total knee arthroplasty were presented and reviewed. The risks due to aseptic loosening, infection, stiffness, patella tracking problems, thromboembolic complications and other imponderables were discussed. The patient acknowledged the explanation, agreed to proceed with the plan and consent was signed. Patient is being admitted for inpatient  treatment for surgery, pain control, PT, OT, prophylactic antibiotics, VTE prophylaxis, progressive ambulation and ADL's and discharge planning. The patient is planning to be discharged home with home health services

## 2015-11-26 ENCOUNTER — Encounter (HOSPITAL_COMMUNITY)
Admission: RE | Disposition: A | Discharge status: Skilled Nursing Facility | Payer: Self-pay | Source: Ambulatory Visit | Attending: Orthopaedic Surgery

## 2015-11-26 ENCOUNTER — Encounter (HOSPITAL_COMMUNITY): Payer: Self-pay | Admitting: *Deleted

## 2015-11-26 ENCOUNTER — Inpatient Hospital Stay (HOSPITAL_COMMUNITY): Payer: Medicare Other | Admitting: Anesthesiology

## 2015-11-26 ENCOUNTER — Inpatient Hospital Stay (HOSPITAL_COMMUNITY)
Admission: RE | Admit: 2015-11-26 | Discharge: 2015-11-28 | DRG: 470 | Disposition: A | Discharge status: Skilled Nursing Facility | Payer: Medicare Other | Source: Ambulatory Visit | Attending: Orthopaedic Surgery | Admitting: Orthopaedic Surgery

## 2015-11-26 DIAGNOSIS — Z96651 Presence of right artificial knee joint: Secondary | ICD-10-CM | POA: Diagnosis not present

## 2015-11-26 DIAGNOSIS — M6281 Muscle weakness (generalized): Secondary | ICD-10-CM | POA: Diagnosis not present

## 2015-11-26 DIAGNOSIS — E039 Hypothyroidism, unspecified: Secondary | ICD-10-CM | POA: Diagnosis present

## 2015-11-26 DIAGNOSIS — R279 Unspecified lack of coordination: Secondary | ICD-10-CM | POA: Diagnosis not present

## 2015-11-26 DIAGNOSIS — M25569 Pain in unspecified knee: Secondary | ICD-10-CM | POA: Diagnosis not present

## 2015-11-26 DIAGNOSIS — M25561 Pain in right knee: Secondary | ICD-10-CM | POA: Diagnosis not present

## 2015-11-26 DIAGNOSIS — E782 Mixed hyperlipidemia: Secondary | ICD-10-CM | POA: Diagnosis present

## 2015-11-26 DIAGNOSIS — M1711 Unilateral primary osteoarthritis, right knee: Secondary | ICD-10-CM | POA: Diagnosis present

## 2015-11-26 DIAGNOSIS — R2681 Unsteadiness on feet: Secondary | ICD-10-CM | POA: Diagnosis not present

## 2015-11-26 DIAGNOSIS — Z791 Long term (current) use of non-steroidal anti-inflammatories (NSAID): Secondary | ICD-10-CM | POA: Diagnosis not present

## 2015-11-26 DIAGNOSIS — R531 Weakness: Secondary | ICD-10-CM | POA: Diagnosis not present

## 2015-11-26 DIAGNOSIS — M179 Osteoarthritis of knee, unspecified: Secondary | ICD-10-CM | POA: Diagnosis not present

## 2015-11-26 DIAGNOSIS — Z853 Personal history of malignant neoplasm of breast: Secondary | ICD-10-CM

## 2015-11-26 DIAGNOSIS — H9191 Unspecified hearing loss, right ear: Secondary | ICD-10-CM | POA: Diagnosis present

## 2015-11-26 DIAGNOSIS — Z4733 Aftercare following explantation of knee joint prosthesis: Secondary | ICD-10-CM | POA: Diagnosis not present

## 2015-11-26 HISTORY — DX: Unilateral primary osteoarthritis, right knee: M17.11

## 2015-11-26 HISTORY — PX: TOTAL KNEE ARTHROPLASTY: SHX125

## 2015-11-26 SURGERY — ARTHROPLASTY, KNEE, TOTAL
Anesthesia: Monitor Anesthesia Care | Site: Knee | Laterality: Right

## 2015-11-26 MED ORDER — PHENYLEPHRINE HCL 10 MG/ML IJ SOLN
INTRAMUSCULAR | Status: DC | PRN
  Administered 2015-11-26: 80 ug via INTRAVENOUS
  Administered 2015-11-26 (×8): 40 ug via INTRAVENOUS

## 2015-11-26 MED ORDER — LIDOCAINE 2% (20 MG/ML) 5 ML SYRINGE
INTRAMUSCULAR | Status: DC | PRN
  Administered 2015-11-26: 20 mg via INTRAVENOUS

## 2015-11-26 MED ORDER — PROPOFOL 10 MG/ML IV BOLUS
INTRAVENOUS | Status: AC
  Filled 2015-11-26: qty 20

## 2015-11-26 MED ORDER — DIPHENHYDRAMINE HCL 12.5 MG/5ML PO ELIX: 12.5000 mg | ORAL_SOLUTION | ORAL | Status: DC | PRN

## 2015-11-26 MED ORDER — METOCLOPRAMIDE HCL 5 MG/ML IJ SOLN: 5.0000 mg | Freq: Three times a day (TID) | INTRAMUSCULAR | Status: DC | PRN

## 2015-11-26 MED ORDER — POTASSIUM GLUCONATE 595 MG PO CAPS: 595.0000 mg | ORAL_CAPSULE | Freq: Every day | ORAL | Status: DC

## 2015-11-26 MED ORDER — HYDROMORPHONE HCL 1 MG/ML IJ SOLN: 0.2500 mg | INTRAMUSCULAR | Status: DC | PRN

## 2015-11-26 MED ORDER — ACETAMINOPHEN 325 MG PO TABS
650.0000 mg | ORAL_TABLET | Freq: Four times a day (QID) | ORAL | Status: DC | PRN
Start: 2015-11-26 — End: 2015-11-28

## 2015-11-26 MED ORDER — MIDAZOLAM HCL 2 MG/2ML IJ SOLN
INTRAMUSCULAR | Status: AC
  Filled 2015-11-26: qty 2

## 2015-11-26 MED ORDER — HYDROCODONE-ACETAMINOPHEN 5-325 MG PO TABS
1.0000 | ORAL_TABLET | ORAL | Status: DC | PRN
  Administered 2015-11-26 – 2015-11-28 (×9): 2 via ORAL
  Filled 2015-11-26 (×10): qty 2

## 2015-11-26 MED ORDER — ONDANSETRON HCL 4 MG/2ML IJ SOLN: 4.0000 mg | Freq: Four times a day (QID) | INTRAMUSCULAR | Status: DC | PRN

## 2015-11-26 MED ORDER — SODIUM CHLORIDE 0.9 % IV SOLN
2000.0000 mg | Freq: Once | INTRAVENOUS | Status: DC
  Filled 2015-11-26: qty 20

## 2015-11-26 MED ORDER — ONDANSETRON HCL 4 MG PO TABS: 4.0000 mg | ORAL_TABLET | Freq: Four times a day (QID) | ORAL | Status: DC | PRN

## 2015-11-26 MED ORDER — PHENOL 1.4 % MT LIQD: 1.0000 | OROMUCOSAL | Status: DC | PRN

## 2015-11-26 MED ORDER — FENTANYL CITRATE (PF) 100 MCG/2ML IJ SOLN
INTRAMUSCULAR | Status: DC
Start: 2015-11-26 — End: 2015-11-26
  Filled 2015-11-26: qty 2

## 2015-11-26 MED ORDER — PROMETHAZINE HCL 25 MG/ML IJ SOLN: 6.2500 mg | INTRAMUSCULAR | Status: DC | PRN

## 2015-11-26 MED ORDER — BUPIVACAINE LIPOSOME 1.3 % IJ SUSP
20.0000 mL | INTRAMUSCULAR | Status: DC
  Filled 2015-11-26: qty 20

## 2015-11-26 MED ORDER — MENTHOL 3 MG MT LOZG: 1.0000 | LOZENGE | OROMUCOSAL | Status: DC | PRN

## 2015-11-26 MED ORDER — ACETAMINOPHEN 650 MG RE SUPP: 650.0000 mg | Freq: Four times a day (QID) | RECTAL | Status: DC | PRN

## 2015-11-26 MED ORDER — METHOCARBAMOL 500 MG PO TABS
ORAL_TABLET | ORAL | Status: AC
  Administered 2015-11-26: 500 mg via ORAL
  Filled 2015-11-26: qty 1

## 2015-11-26 MED ORDER — PROPOFOL 500 MG/50ML IV EMUL
INTRAVENOUS | Status: DC | PRN
  Administered 2015-11-26: 75 ug/kg/min via INTRAVENOUS
  Administered 2015-11-26: 85 ug/kg/min via INTRAVENOUS

## 2015-11-26 MED ORDER — ONDANSETRON HCL 4 MG/2ML IJ SOLN
INTRAMUSCULAR | Status: AC
  Filled 2015-11-26: qty 2

## 2015-11-26 MED ORDER — FENTANYL CITRATE (PF) 100 MCG/2ML IJ SOLN
INTRAMUSCULAR | Status: AC
  Filled 2015-11-26: qty 4

## 2015-11-26 MED ORDER — MIDAZOLAM HCL 5 MG/5ML IJ SOLN
INTRAMUSCULAR | Status: DC | PRN
  Administered 2015-11-26 (×2): 1 mg via INTRAVENOUS

## 2015-11-26 MED ORDER — BUPIVACAINE-EPINEPHRINE (PF) 0.5% -1:200000 IJ SOLN
INTRAMUSCULAR | Status: DC | PRN
  Administered 2015-11-26: 20 mL via PERINEURAL

## 2015-11-26 MED ORDER — HYDROCODONE-ACETAMINOPHEN 5-325 MG PO TABS
ORAL_TABLET | ORAL | Status: AC
  Administered 2015-11-26: 2 via ORAL
  Filled 2015-11-26: qty 2

## 2015-11-26 MED ORDER — ASPIRIN EC 325 MG PO TBEC
325.0000 mg | DELAYED_RELEASE_TABLET | Freq: Two times a day (BID) | ORAL | Status: DC
  Administered 2015-11-27 – 2015-11-28 (×3): 325 mg via ORAL
  Filled 2015-11-26 (×3): qty 1

## 2015-11-26 MED ORDER — CEFAZOLIN SODIUM-DEXTROSE 2-4 GM/100ML-% IV SOLN
2.0000 g | Freq: Four times a day (QID) | INTRAVENOUS | Status: AC
  Administered 2015-11-26 – 2015-11-27 (×2): 2 g via INTRAVENOUS
  Filled 2015-11-26 (×2): qty 100

## 2015-11-26 MED ORDER — LACTATED RINGERS IV SOLN
INTRAVENOUS | Status: DC
  Administered 2015-11-26: 11:00:00 via INTRAVENOUS

## 2015-11-26 MED ORDER — LACTATED RINGERS IV SOLN
INTRAVENOUS | Status: DC | PRN
  Administered 2015-11-26 (×2): via INTRAVENOUS

## 2015-11-26 MED ORDER — HYDROMORPHONE HCL 1 MG/ML IJ SOLN: 0.5000 mg | INTRAMUSCULAR | Status: DC | PRN

## 2015-11-26 MED ORDER — METHOCARBAMOL 500 MG PO TABS
500.0000 mg | ORAL_TABLET | Freq: Four times a day (QID) | ORAL | Status: DC | PRN
  Administered 2015-11-26 – 2015-11-27 (×2): 500 mg via ORAL
  Filled 2015-11-26 (×2): qty 1

## 2015-11-26 MED ORDER — ALUM & MAG HYDROXIDE-SIMETH 200-200-20 MG/5ML PO SUSP: 30.0000 mL | ORAL | Status: DC | PRN

## 2015-11-26 MED ORDER — DOCUSATE SODIUM 100 MG PO CAPS
100.0000 mg | ORAL_CAPSULE | Freq: Two times a day (BID) | ORAL | Status: DC
  Filled 2015-11-26 (×4): qty 1

## 2015-11-26 MED ORDER — TRANEXAMIC ACID 1000 MG/10ML IV SOLN
1000.0000 mg | Freq: Once | INTRAVENOUS | Status: AC
  Administered 2015-11-26: 1000 mg via INTRAVENOUS
  Filled 2015-11-26: qty 10

## 2015-11-26 MED ORDER — SODIUM CHLORIDE 0.9 % IR SOLN
Status: DC | PRN
  Administered 2015-11-26: 3000 mL

## 2015-11-26 MED ORDER — PHENYLEPHRINE 40 MCG/ML (10ML) SYRINGE FOR IV PUSH (FOR BLOOD PRESSURE SUPPORT)
PREFILLED_SYRINGE | INTRAVENOUS | Status: AC
  Filled 2015-11-26: qty 10

## 2015-11-26 MED ORDER — BUPIVACAINE HCL (PF) 0.5 % IJ SOLN
INTRAMUSCULAR | Status: DC | PRN
  Administered 2015-11-26: 50 mL

## 2015-11-26 MED ORDER — EPHEDRINE SULFATE 50 MG/ML IJ SOLN
INTRAMUSCULAR | Status: DC | PRN
  Administered 2015-11-26 (×6): 5 mg via INTRAVENOUS

## 2015-11-26 MED ORDER — SIMVASTATIN 20 MG PO TABS
20.0000 mg | ORAL_TABLET | Freq: Every day | ORAL | Status: DC
  Administered 2015-11-26 – 2015-11-27 (×2): 20 mg via ORAL
  Filled 2015-11-26 (×2): qty 1

## 2015-11-26 MED ORDER — BUPIVACAINE IN DEXTROSE 0.75-8.25 % IT SOLN
INTRATHECAL | Status: DC | PRN
  Administered 2015-11-26: 1.8 mL via INTRATHECAL

## 2015-11-26 MED ORDER — DIAZEPAM 5 MG PO TABS: 5.0000 mg | ORAL_TABLET | Freq: Three times a day (TID) | ORAL | Status: DC | PRN

## 2015-11-26 MED ORDER — METHOCARBAMOL 1000 MG/10ML IJ SOLN
500.0000 mg | Freq: Four times a day (QID) | INTRAMUSCULAR | Status: DC | PRN
  Filled 2015-11-26: qty 5

## 2015-11-26 MED ORDER — 0.9 % SODIUM CHLORIDE (POUR BTL) OPTIME
TOPICAL | Status: DC | PRN
  Administered 2015-11-26: 1000 mL

## 2015-11-26 MED ORDER — FENTANYL CITRATE (PF) 100 MCG/2ML IJ SOLN
INTRAMUSCULAR | Status: DC | PRN
  Administered 2015-11-26 (×2): 25 ug via INTRAVENOUS

## 2015-11-26 MED ORDER — CHLORHEXIDINE GLUCONATE 4 % EX LIQD: 60.0000 mL | Freq: Once | CUTANEOUS | Status: DC

## 2015-11-26 MED ORDER — LEVOTHYROXINE SODIUM 75 MCG PO TABS
150.0000 ug | ORAL_TABLET | Freq: Every day | ORAL | Status: DC
  Administered 2015-11-27: 150 ug via ORAL
  Administered 2015-11-28: 75 ug via ORAL
  Filled 2015-11-26 (×2): qty 2

## 2015-11-26 MED ORDER — METOCLOPRAMIDE HCL 5 MG PO TABS: 5.0000 mg | ORAL_TABLET | Freq: Three times a day (TID) | ORAL | Status: DC | PRN

## 2015-11-26 MED ORDER — EPHEDRINE 5 MG/ML INJ
INTRAVENOUS | Status: AC
  Filled 2015-11-26: qty 10

## 2015-11-26 MED ORDER — BISACODYL 5 MG PO TBEC: 5.0000 mg | DELAYED_RELEASE_TABLET | Freq: Every day | ORAL | Status: DC | PRN

## 2015-11-26 MED ORDER — LACTATED RINGERS IV SOLN: INTRAVENOUS | Status: DC

## 2015-11-26 MED ORDER — ONDANSETRON HCL 4 MG/2ML IJ SOLN
INTRAMUSCULAR | Status: DC | PRN
  Administered 2015-11-26: 4 mg via INTRAVENOUS

## 2015-11-26 MED ORDER — TRANEXAMIC ACID 1000 MG/10ML IV SOLN
1000.0000 mg | INTRAVENOUS | Status: AC
  Administered 2015-11-26: 1000 mg via INTRAVENOUS
  Filled 2015-11-26: qty 10

## 2015-11-26 MED ORDER — SODIUM CHLORIDE 0.9 % IJ SOLN
INTRAMUSCULAR | Status: DC | PRN
  Administered 2015-11-26: 40 mL via INTRAVENOUS

## 2015-11-26 MED ORDER — CEFAZOLIN SODIUM-DEXTROSE 2-4 GM/100ML-% IV SOLN
2.0000 g | INTRAVENOUS | Status: AC
  Administered 2015-11-26: 2 g via INTRAVENOUS
  Filled 2015-11-26: qty 100

## 2015-11-26 MED ORDER — BUPIVACAINE-EPINEPHRINE (PF) 0.5% -1:200000 IJ SOLN
INTRAMUSCULAR | Status: AC
  Filled 2015-11-26: qty 30

## 2015-11-26 SURGICAL SUPPLY — 67 items
BAG DECANTER FOR FLEXI CONT (MISCELLANEOUS) ×6 IMPLANT
BANDAGE ACE 4X5 VEL STRL LF (GAUZE/BANDAGES/DRESSINGS) ×3 IMPLANT
BANDAGE ACE 6X5 VEL STRL LF (GAUZE/BANDAGES/DRESSINGS) ×3 IMPLANT
BANDAGE ESMARK 6X9 LF (GAUZE/BANDAGES/DRESSINGS) ×1 IMPLANT
BLADE SAGITTAL 25.0X1.19X90 (BLADE) ×2 IMPLANT
BLADE SAGITTAL 25.0X1.19X90MM (BLADE) ×1
BLADE SAW SGTL 13.0X1.19X90.0M (BLADE) ×3 IMPLANT
BLADE SURG ROTATE 9660 (MISCELLANEOUS) ×3 IMPLANT
BNDG ELASTIC 6X10 VLCR STRL LF (GAUZE/BANDAGES/DRESSINGS) ×3 IMPLANT
BNDG ESMARK 6X9 LF (GAUZE/BANDAGES/DRESSINGS) ×3
BNDG GAUZE ELAST 4 BULKY (GAUZE/BANDAGES/DRESSINGS) ×6 IMPLANT
BOWL SMART MIX CTS (DISPOSABLE) ×3 IMPLANT
CAP KNEE TOTAL 3 SIGMA ×3 IMPLANT
CEMENT HV SMART SET (Cement) ×6 IMPLANT
CLOSURE STERI-STRIP 1/2X4 (GAUZE/BANDAGES/DRESSINGS) ×1
CLOSURE WOUND 1/2 X4 (GAUZE/BANDAGES/DRESSINGS) ×1
CLSR STERI-STRIP ANTIMIC 1/2X4 (GAUZE/BANDAGES/DRESSINGS) ×2 IMPLANT
COVER SURGICAL LIGHT HANDLE (MISCELLANEOUS) ×3 IMPLANT
CUFF TOURNIQUET SINGLE 34IN LL (TOURNIQUET CUFF) ×3 IMPLANT
CUFF TOURNIQUET SINGLE 44IN (TOURNIQUET CUFF) IMPLANT
DECANTER SPIKE VIAL GLASS SM (MISCELLANEOUS) ×3 IMPLANT
DRAPE EXTREMITY T 121X128X90 (DRAPE) ×3 IMPLANT
DRAPE PROXIMA HALF (DRAPES) ×3 IMPLANT
DRAPE U-SHAPE 47X51 STRL (DRAPES) ×3 IMPLANT
DRSG ADAPTIC 3X8 NADH LF (GAUZE/BANDAGES/DRESSINGS) ×3 IMPLANT
DRSG KUZMA FLUFF (GAUZE/BANDAGES/DRESSINGS) ×3 IMPLANT
DRSG PAD ABDOMINAL 8X10 ST (GAUZE/BANDAGES/DRESSINGS) ×3 IMPLANT
DURAPREP 26ML APPLICATOR (WOUND CARE) ×3 IMPLANT
ELECT REM PT RETURN 9FT ADLT (ELECTROSURGICAL) ×3
ELECTRODE REM PT RTRN 9FT ADLT (ELECTROSURGICAL) ×1 IMPLANT
GAUZE SPONGE 4X4 12PLY STRL (GAUZE/BANDAGES/DRESSINGS) ×3 IMPLANT
GLOVE BIO SURGEON STRL SZ8 (GLOVE) ×6 IMPLANT
GLOVE BIOGEL PI IND STRL 8 (GLOVE) ×2 IMPLANT
GLOVE BIOGEL PI INDICATOR 8 (GLOVE) ×4
GOWN STRL REUS W/ TWL LRG LVL3 (GOWN DISPOSABLE) ×1 IMPLANT
GOWN STRL REUS W/ TWL XL LVL3 (GOWN DISPOSABLE) ×2 IMPLANT
GOWN STRL REUS W/TWL LRG LVL3 (GOWN DISPOSABLE) ×3
GOWN STRL REUS W/TWL XL LVL3 (GOWN DISPOSABLE) ×6
HANDPIECE INTERPULSE COAX TIP (DISPOSABLE) ×2
HOOD PEEL AWAY FACE SHEILD DIS (HOOD) ×6 IMPLANT
IMMOBILIZER KNEE 22 UNIV (SOFTGOODS) ×3 IMPLANT
IV CATH 22GX1 FEP (IV SOLUTION) ×3 IMPLANT
KIT BASIN OR (CUSTOM PROCEDURE TRAY) ×3 IMPLANT
KIT ROOM TURNOVER OR (KITS) ×3 IMPLANT
MANIFOLD NEPTUNE II (INSTRUMENTS) ×3 IMPLANT
NEEDLE HYPO 21X1 ECLIPSE (NEEDLE) ×3 IMPLANT
NS IRRIG 1000ML POUR BTL (IV SOLUTION) ×3 IMPLANT
PACK TOTAL JOINT (CUSTOM PROCEDURE TRAY) ×3 IMPLANT
PACK UNIVERSAL I (CUSTOM PROCEDURE TRAY) ×3 IMPLANT
PAD ARMBOARD 7.5X6 YLW CONV (MISCELLANEOUS) ×6 IMPLANT
SET HNDPC FAN SPRY TIP SCT (DISPOSABLE) ×1 IMPLANT
SPONGE GAUZE 4X4 12PLY STER LF (GAUZE/BANDAGES/DRESSINGS) ×3 IMPLANT
STAPLER VISISTAT 35W (STAPLE) IMPLANT
STRIP CLOSURE SKIN 1/2X4 (GAUZE/BANDAGES/DRESSINGS) ×2 IMPLANT
SUCTION FRAZIER HANDLE 10FR (MISCELLANEOUS)
SUCTION TUBE FRAZIER 10FR DISP (MISCELLANEOUS) IMPLANT
SUT MNCRL AB 3-0 PS2 18 (SUTURE) ×3 IMPLANT
SUT VIC AB 0 CT1 27 (SUTURE) ×6
SUT VIC AB 0 CT1 27XBRD ANBCTR (SUTURE) ×3 IMPLANT
SUT VIC AB 2-0 CT1 27 (SUTURE) ×9
SUT VIC AB 2-0 CT1 TAPERPNT 27 (SUTURE) ×3 IMPLANT
SUT VLOC 180 0 24IN GS25 (SUTURE) ×3 IMPLANT
SYR 50ML LL SCALE MARK (SYRINGE) ×3 IMPLANT
TOWEL OR 17X24 6PK STRL BLUE (TOWEL DISPOSABLE) ×3 IMPLANT
TOWEL OR 17X26 10 PK STRL BLUE (TOWEL DISPOSABLE) ×3 IMPLANT
TRAY FOLEY CATH 14FR (SET/KITS/TRAYS/PACK) IMPLANT
WATER STERILE IRR 1000ML POUR (IV SOLUTION) ×6 IMPLANT

## 2015-11-26 NOTE — Op Note (Signed)
PREOP DIAGNOSIS: DJD RIGHT KNEE POSTOP DIAGNOSIS: same PROCEDURE: RIGHT TKR ANESTHESIA: Spinal and MAC ATTENDING SURGEON: Katrina Brosh G ASSISTANT: Loni Dolly PA  INDICATIONS FOR PROCEDURE: Alexandria Davies is a 82 y.o. female who has struggled for a long time with pain due to degenerative arthritis of the right knee.  The patient has failed many conservative non-operative measures and at this point has pain which limits the ability to sleep and walk.  The patient is offered total knee replacement.  Informed operative consent was obtained after discussion of possible risks of anesthesia, infection, neurovascular injury, DVT, and death.  The importance of the post-operative rehabilitation protocol to optimize result was stressed extensively with the patient.  SUMMARY OF FINDINGS AND PROCEDURE:  [name] was taken to the operative suite where under the above anesthesia a right knee replacement was performed.  There were advanced degenerative changes and the bone quality was good.  We used the DePuy LCS system and placed size standard femur, 2.5 tibia, 38 mm all polyethylene patella, and a size 10 mm spacer.  Loni Dolly PA-C assisted throughout and was invaluable to the completion of the case in that he helped retract and maintain exposure while I placed components.  He also helped close thereby minimizing OR time.  The patient was admitted for appropriate post-op care to include perioperative antibiotics and mechanical and pharmacologic measures for DVT prophylaxis.  DESCRIPTION OF PROCEDURE:  MICAHLA CABO was taken to the operative suite where the above anesthesia was applied.  The patient was positioned supine and prepped and draped in normal sterile fashion.  An appropriate time out was performed.  After the administration of kefzol pre-op antibiotic the leg was elevated and exsanguinated and a tourniquet inflated. A standard longitudinal incision was made on the anterior knee.  Dissection was  carried down to the extensor mechanism.  All appropriate anti-infective measures were used including the pre-operative antibiotic, betadine impregnated drape, and closed hooded exhaust systems for each member of the surgical team.  A medial parapatellar incision was made in the extensor mechanism and the knee cap flipped and the knee flexed.  Some residual meniscal tissues were removed along with any remaining ACL/PCL tissue.  A guide was placed on the tibia and a flat cut was made on it's superior surface.  An intramedullary guide was placed in the femur and was utilized to make anterior and posterior cuts creating an appropriate flexion gap.  A second intramedullary guide was placed in the femur to make a distal cut properly balancing the knee with an extension gap equal to the flexion gap.  The three bones sized to the above mentioned sizes and the appropriate guides were placed and utilized.  A trial reduction was done and the knee easily came to full extension and the patella tracked well on flexion.  The trial components were removed and all bones were cleaned with pulsatile lavage and then dried thoroughly.  Cement was mixed and was pressurized onto the bones followed by placement of the aforementioned components.  Excess cement was trimmed and pressure was held on the components until the cement had hardened.  The tourniquet was deflated and a small amount of bleeding was controlled with cautery and pressure.  The knee was irrigated thoroughly.  The extensor mechanism was re-approximated with V-loc suture in running fashion.  The knee was flexed and the repair was solid.  The subcutaneous tissues were re-approximated with #0 and #2-0 vicryl and the skin closed with a  subcuticular stitch and steristrips.  A sterile dressing was applied.  Intraoperative fluids, EBL, and tourniquet time can be obtained from anesthesia records.  DISPOSITION:  The patient was taken to recovery room in stable condition and  admitted for appropriate post-op care to include peri-operative antibiotic and DVT prophylaxis with mechanical and pharmacologic measures.  Racquelle Hyser G 11/26/2015, 3:14 PM

## 2015-11-26 NOTE — Transfer of Care (Signed)
Immediate Anesthesia Transfer of Care Note  Patient: Alexandria Davies  Procedure(s) Performed: Procedure(s): TOTAL KNEE ARTHROPLASTY (Right)  Patient Location: PACU  Anesthesia Type:Spinal  Level of Consciousness: awake, alert  and oriented  Airway & Oxygen Therapy: Patient Spontanous Breathing and Patient connected to face mask oxygen  Post-op Assessment: Report given to RN, Post -op Vital signs reviewed and stable and Patient moving all extremities X 4  Post vital signs: Reviewed and stable  Last Vitals:  Vitals:   11/26/15 1140  BP: (!) 156/74  Pulse: 71  Resp: 20  Temp: 37.1 C    Last Pain:  Vitals:   11/26/15 1140  TempSrc: Oral         Complications: No apparent anesthesia complications

## 2015-11-26 NOTE — Progress Notes (Signed)
Orthopedic Tech Progress Note Patient Details:  Alexandria Davies 10-09-1925 [id number]  CPM Right Knee CPM Right Knee: On Right Knee Flexion (Degrees): 90 Right Knee Extension (Degrees): 0 Additional Comments: trapeze bar patient helper   Hildred Priest 11/26/2015, 4:23 PM Viewed order from doctor's order list

## 2015-11-26 NOTE — Progress Notes (Signed)
Patient reports being anxious offered to allow her family to come back, patient refused.

## 2015-11-26 NOTE — Anesthesia Preprocedure Evaluation (Addendum)
Anesthesia Evaluation  Patient identified by MRN, date of birth, ID band Patient awake    Reviewed: Allergy & Precautions, NPO status , Patient's Chart, lab work & pertinent test results  Airway Mallampati: II  TM Distance: >3 FB Neck ROM: Full    Dental  (+) Dental Advisory Given, Edentulous Upper   Pulmonary neg pulmonary ROS,    breath sounds clear to auscultation       Cardiovascular negative cardio ROS   Rhythm:Regular Rate:Normal     Neuro/Psych Anxiety negative neurological ROS     GI/Hepatic negative GI ROS, Neg liver ROS,   Endo/Other  Hypothyroidism   Renal/GU negative Renal ROS     Musculoskeletal  (+) Arthritis ,   Abdominal   Peds  Hematology negative hematology ROS (+)   Anesthesia Other Findings   Reproductive/Obstetrics                          Lab Results  Component Value Date   WBC 7.7 11/15/2015   HGB 13.4 11/15/2015   HCT 41.1 11/15/2015   MCV 91.3 11/15/2015   PLT 247 11/15/2015   Lab Results  Component Value Date   INR 1.00 11/15/2015   Lab Results  Component Value Date   CREATININE 0.84 11/15/2015   BUN 12 11/15/2015   NA 139 11/15/2015   K 3.8 11/15/2015   CL 105 11/15/2015   CO2 26 11/15/2015    Anesthesia Physical Anesthesia Plan  ASA: II  Anesthesia Plan: Spinal and MAC   Post-op Pain Management:    Induction: Intravenous  Airway Management Planned: Simple Face Mask and Natural Airway  Additional Equipment:   Intra-op Plan:   Post-operative Plan:   Informed Consent: I have reviewed the patients History and Physical, chart, labs and discussed the procedure including the risks, benefits and alternatives for the proposed anesthesia with the patient or authorized representative who has indicated his/her understanding and acceptance.     Plan Discussed with: CRNA  Anesthesia Plan Comments:        Anesthesia Quick Evaluation

## 2015-11-26 NOTE — Anesthesia Procedure Notes (Signed)
Spinal  Staffing Anesthesiologist: Saydie Gerdts Performed: anesthesiologist  Preanesthetic Checklist Completed: patient identified, site marked, surgical consent, pre-op evaluation, timeout performed, IV checked, risks and benefits discussed and monitors and equipment checked Spinal Block Patient position: sitting Prep: site prepped and draped and DuraPrep Patient monitoring: heart rate, continuous pulse ox and blood pressure Approach: midline Location: L4-5 Injection technique: single-shot Needle Needle type: Pencan  Needle gauge: 24 G Needle length: 9 cm     

## 2015-11-26 NOTE — Interval H&P Note (Signed)
History and Physical Interval Note:  11/26/2015 12:12 PM  Alexandria Davies  has presented today for surgery, with the diagnosis of RIGHT KNEE DEGENERATIVE JOINT DISEASE  The various methods of treatment have been discussed with the patient and family. After consideration of risks, benefits and other options for treatment, the patient has consented to  Procedure(s): TOTAL KNEE ARTHROPLASTY (Right) as a surgical intervention .  The patient's history has been reviewed, patient examined, no change in status, stable for surgery.  I have reviewed the patient's chart and labs.  Questions were answered to the patient's satisfaction.     Cyrus Ramsburg G

## 2015-11-26 NOTE — Anesthesia Procedure Notes (Signed)
Procedure Name: MAC Date/Time: 11/26/2015 1:55 PM Performed by: Garrison Columbus T Pre-anesthesia Checklist: Patient identified, Emergency Drugs available, Suction available and Patient being monitored Patient Re-evaluated:Patient Re-evaluated prior to inductionOxygen Delivery Method: Simple face mask Preoxygenation: Pre-oxygenation with 100% oxygen Intubation Type: IV induction Placement Confirmation: positive ETCO2 and breath sounds checked- equal and bilateral Dental Injury: Teeth and Oropharynx as per pre-operative assessment

## 2015-11-27 ENCOUNTER — Encounter (HOSPITAL_COMMUNITY): Payer: Self-pay | Admitting: Orthopaedic Surgery

## 2015-11-27 NOTE — Anesthesia Postprocedure Evaluation (Signed)
Anesthesia Post Note  Patient: Alexandria Davies  Procedure(s) Performed: Procedure(s) (LRB): TOTAL KNEE ARTHROPLASTY (Right)  Patient location during evaluation: PACU Anesthesia Type: Spinal and MAC Level of consciousness: awake and alert Pain management: pain level controlled Vital Signs Assessment: post-procedure vital signs reviewed and stable Respiratory status: spontaneous breathing and respiratory function stable Cardiovascular status: blood pressure returned to baseline and stable Postop Assessment: spinal receding Anesthetic complications: no    Last Vitals:  Vitals:   11/27/15 0011 11/27/15 0500  BP: (!) 112/45 (!) 106/50  Pulse: (!) 59 60  Resp: 16 17  Temp: 36.6 C 36.7 C    Last Pain:  Vitals:   11/27/15 0600  TempSrc:   PainSc: 1                  Tiajuana Amass

## 2015-11-27 NOTE — Clinical Social Work Placement (Signed)
   CLINICAL SOCIAL WORK PLACEMENT  NOTE  Date:  11/27/2015  Patient Details  Name: Alexandria Davies MRN: [id number] Date of Birth: [birth year]  Clinical Social Work is seeking post-discharge placement for this patient at the [place] level of care (*CSW will initial, date and re-position this form in  chart as items are completed):  Yes   Patient/family provided with [facility] Work Department's list of facilities offering this level of care within the geographic area requested by the patient (or if unable, by the patient's family).  Yes   Patient/family informed of their freedom to choose among providers that offer the needed level of care, that participate in Medicare, Medicaid or managed care program needed by the patient, have an available bed and are willing to accept the patient.  Yes   Patient/family informed of [facility]'s ownership interest in [facility] and [facility], as well as of the fact that they are under no obligation to receive care at these facilities.  PASRR submitted to EDS on 11/27/15     PASRR number received on 11/27/15     Existing PASRR number confirmed on       FL2 transmitted to all facilities in geographic area requested by pt/family on 11/27/15     FL2 transmitted to all facilities within larger geographic area on       Patient informed that his/her managed care company has contracts with or will negotiate with certain facilities, including the following:        Yes   Patient/family informed of bed offers received.  Patient chooses bed at [facility], [place]     Physician recommends and patient chooses bed at [facility], [place]    Patient to be transferred to [facility], [place] on 11/28/15.  Patient to be transferred to facility by [facility]     Patient family notified on   of transfer.  Name of family member notified:        PHYSICIAN Please prepare priority discharge summary, including  medications     Additional Comment:    _______________________________________________ Dulcy Fanny, LCSW 11/27/2015, 11:38 AM

## 2015-11-27 NOTE — NC FL2 (Signed)
[place] MEDICAID FL2 LEVEL OF CARE SCREENING TOOL     IDENTIFICATION  Patient Name: Alexandria Davies Birthdate: Feb 17, 1926 Sex: female Admission Date (Current Location): 11/26/2015  [facility] and [place] Number:  Herbalist and Address:  [facility], [place] [address], [place], [place] [address]      Provider Number: [id number]  Attending Physician Name and Address:  Melrose Nakayama, MD  Relative Name and Phone Number:       Current Level of Care: Hospital Recommended Level of Care: [place] Prior Approval Number:    Date Approved/Denied:   PASRR Number: [id number]  Discharge Plan: SNF    Current Diagnoses: Patient Active Problem List   Diagnosis Date Noted  . Primary osteoarthritis of right knee 11/26/2015  . Insomnia 10/22/2014  . Generalized anxiety disorder 10/22/2014  . Elevated BP 02/14/2014  . Mixed hyperlipidemia 02/14/2014  . Vitamin D deficiency 02/14/2014  . Medication management 02/14/2014  . Rhinitis, allergic 08/19/2011  . Diverticulosis 04/23/2011  . Colon polyps 04/23/2011  . PERSONAL HISTORY OF MALIGNANT NEOPLASM OF BREAST 12/07/2007  . Hypothyroidism 12/05/2007  . Osteoarthritis 12/05/2007  . Internal hemorrhoids 11/08/2002    Orientation RESPIRATION BLADDER Height & Weight     Self, Time, Situation, Place  Normal Continent Weight: 134 lb (60.8 kg) Height:  5\' 4"  (162.6 cm)  BEHAVIORAL SYMPTOMS/MOOD NEUROLOGICAL BOWEL NUTRITION STATUS      Continent    AMBULATORY STATUS COMMUNICATION OF NEEDS Skin   Limited Assist Verbally Surgical wounds                       Personal Care Assistance Level of Assistance  Bathing, Dressing Bathing Assistance: Limited assistance   Dressing Assistance: Limited assistance     Functional Limitations Info             SPECIAL CARE FACTORS FREQUENCY  PT (By licensed PT), OT (By licensed OT)     PT Frequency: daily OT Frequency: daily             Contractures Contractures Info: Not present    Additional Factors Info  Allergies   Allergies Info: Ciprofloxacin, Epinephrine, Etodolac, Synephrine, Flagyl Metronidazole Hcl           Current Medications (11/27/2015):  This is the current hospital active medication list Current Facility-Administered Medications  Medication Dose Route Frequency Provider Last Rate Last Dose  . acetaminophen (TYLENOL) tablet 650 mg  650 mg Oral Q6H PRN Loni Dolly, PA-C       Or  . acetaminophen (TYLENOL) suppository 650 mg  650 mg Rectal Q6H PRN Loni Dolly, PA-C      . alum & mag hydroxide-simeth (MAALOX/MYLANTA) 200-200-20 MG/5ML suspension 30 mL  30 mL Oral Q4H PRN Loni Dolly, PA-C      . aspirin EC tablet 325 mg  325 mg Oral BID PC Loni Dolly, PA-C   325 mg at 11/27/15 0802  . bisacodyl (DULCOLAX) EC tablet 5 mg  5 mg Oral Daily PRN Loni Dolly, PA-C      . diazepam (VALIUM) tablet 5 mg  5 mg Oral Q8H PRN Loni Dolly, PA-C      . diphenhydrAMINE (BENADRYL) 12.5 MG/5ML elixir 12.5-25 mg  12.5-25 mg Oral Q4H PRN Loni Dolly, PA-C      . docusate sodium (COLACE) capsule 100 mg  100 mg Oral BID Loni Dolly, PA-C      . HYDROcodone-acetaminophen (NORCO/VICODIN) 5-325 MG per tablet  1-2 tablet  1-2 tablet Oral Q4H PRN Loni Dolly, PA-C   2 tablet at 11/27/15 0759  . HYDROmorphone (DILAUDID) injection 0.5-1 mg  0.5-1 mg Intravenous Q3H PRN Loni Dolly, PA-C      . lactated ringers infusion   Intravenous Continuous Loni Dolly, PA-C      . levothyroxine (SYNTHROID, LEVOTHROID) tablet 150 mcg  150 mcg Oral QAC breakfast Loni Dolly, PA-C   150 mcg at 11/27/15 [id number]  . menthol-cetylpyridinium (CEPACOL) lozenge 3 mg  1 lozenge Oral PRN Loni Dolly, PA-C       Or  . phenol (CHLORASEPTIC) mouth spray 1 spray  1 spray Mouth/Throat PRN Loni Dolly, PA-C      . methocarbamol (ROBAXIN) tablet 500 mg  500 mg Oral Q6H PRN Loni Dolly, PA-C   500 mg at 11/27/15 0948   Or  . methocarbamol (ROBAXIN) 500 mg in  dextrose 5 % 50 mL IVPB  500 mg Intravenous Q6H PRN Loni Dolly, PA-C      . metoCLOPramide (REGLAN) tablet 5-10 mg  5-10 mg Oral Q8H PRN Loni Dolly, PA-C       Or  . metoCLOPramide (REGLAN) injection 5-10 mg  5-10 mg Intravenous Q8H PRN Loni Dolly, PA-C      . ondansetron [facility]) tablet 4 mg  4 mg Oral Q6H PRN Loni Dolly, PA-C       Or  . ondansetron [facility]) injection 4 mg  4 mg Intravenous Q6H PRN Loni Dolly, PA-C      . simvastatin (ZOCOR) tablet 20 mg  20 mg Oral QHS Loni Dolly, PA-C   20 mg at 11/26/15 2231     Discharge Medications: Please see discharge summary for a list of discharge medications.  Relevant Imaging Results:  Relevant Lab Results:   Additional Information    Farrel Conners, [place], LCSW

## 2015-11-27 NOTE — Clinical Social Work Note (Signed)
Clinical Social Work Assessment  Patient Details  Name: Alexandria Davies MRN: [id number] Date of Birth: 14-May-1925  Date of referral:  11/27/15               Reason for consult:  Facility Placement                Permission sought to share information with:  Facility Sport and exercise psychologist, Case Optician, dispensing granted to share information::  Yes, Verbal Permission Granted  Name::        Agency::  [facility]  Relationship::     Contact Information:     Housing/Transportation Living arrangements for the past 2 months:  Single Family Home Source of Information:  Patient Patient Interpreter Needed:  None Criminal Activity/Legal Involvement Pertinent to Current Situation/Hospitalization:  No - Comment as needed Significant Relationships:  Spouse Lives with:  Spouse Do you feel safe going back to the place where you live?  No Need for family participation in patient care:  No (Coment)  Care giving concerns:  No caregiving concerns identified.   Social Worker assessment / plan:  CSW spoke with patient and confirmed arrangements for patient to receive STR at [facility] at time of discharge.  The patient is a bundled patient and placement has been pre-arranged by MD office RNCM.  Patient will need PTAR transportation.  Employment status:  Retired Forensic scientist:  [employer] PT Recommendations:  [place] / Referral to community resources:  [place]  Patient/Family's Response to care:  Agreeable to SNF  Patient/Family's Understanding of and Emotional Response to Diagnosis, Current Treatment, and Prognosis:  Patient wishes she could return home, but is aware of therapies needed and recommendations and is agreeable to SNF.  CSW offered support during this transition time.  Emotional Assessment Appearance:  Appears stated age Attitude/Demeanor/Rapport:    Affect (typically observed):  Accepting,  Adaptable Orientation:  Oriented to Self, Oriented to Place, Oriented to  Time, Oriented to Situation Alcohol / Substance use:  Not Applicable Psych involvement (Current and /or in the community):  No (Comment)  Discharge Needs  Concerns to be addressed:  No discharge needs identified Readmission within the last 30 days:  No Current discharge risk:  None Barriers to Discharge:  No Barriers Identified   Dulcy Fanny, LCSW 11/27/2015, 11:35 AM

## 2015-11-27 NOTE — Evaluation (Signed)
Physical Therapy Evaluation Patient Details Name: Alexandria Davies MRN: [id number] DOB: 07-Dec-1925 Today's Date: 11/27/2015   History of Present Illness  Pt is an 82 y.o. female now s/p Rt TKA. PMH: anxiety.   Clinical Impression  Pt is s/p TKA resulting in the deficits listed below (see PT Problem List). Pt able to ambulate 25 ft with rw during initial PT session (posterior loss of balance with assist to recover with initial standing).  Pt will benefit from skilled PT to increase their independence and safety with mobility to allow discharge to SNF for further rehabilitation.      Follow Up Recommendations SNF;Supervision for mobility/OOB    Equipment Recommendations  None recommended by PT    Recommendations for Other Services       Precautions / Restrictions Precautions Precautions: Knee;Fall Precaution Booklet Issued: Yes (comment) Precaution Comments: HEP provided, reviewed knee extension precautions Required Braces or Orthoses: Knee Immobilizer - Right Restrictions Weight Bearing Restrictions: Yes RLE Weight Bearing: Weight bearing as tolerated      Mobility  Bed Mobility Overal bed mobility: Needs Assistance Bed Mobility: Supine to Sit     Supine to sit: Min assist     General bed mobility comments: Min assist provided with Rt LE, HOB elevated and using rail   Transfers Overall transfer level: Needs assistance Equipment used: Rolling walker (2 wheeled) Transfers: Sit to/from Stand Sit to Stand: Min assist         General transfer comment: Pt need cues for hand placement and min assist for standing. Unsteady with posterior loss of balance with initial stand but improving with time.   Ambulation/Gait Ambulation/Gait assistance: Min guard Ambulation Distance (Feet): 25 Feet Assistive device: Rolling walker (2 wheeled) Gait Pattern/deviations: Step-to pattern;Decreased weight shift to right Gait velocity: decreased   General Gait Details: Encouraging  weightbearing through Rt LE.   Stairs            Wheelchair Mobility    Modified Rankin (Stroke Patients Only)       Balance Overall balance assessment: Needs assistance Sitting-balance support: No upper extremity supported Sitting balance-Leahy Scale: Fair     Standing balance support: Bilateral upper extremity supported Standing balance-Leahy Scale: Poor Standing balance comment: using rw                             Pertinent Vitals/Pain Pain Assessment: 0-10 Pain Score: 3  Pain Location: Rt knee Pain Descriptors / Indicators: Aching Pain Intervention(s): Limited activity within patient's tolerance;Monitored during session    Home Living Family/patient expects to be discharged to:: Skilled nursing facility Living Arrangements: Spouse/significant other   Type of Home: House Home Access: Stairs to enter Entrance Stairs-Rails: None Entrance Stairs-Number of Steps: 2 Home Layout: One level (has 2 steps into sunroom) Home Equipment: Walker - 2 wheels Additional Comments: Pt reports that her husband has health issues and he will not be able to assist at home.     Prior Function Level of Independence: Independent               Hand Dominance        Extremity/Trunk Assessment   Upper Extremity Assessment: Overall WFL for tasks assessed           Lower Extremity Assessment: RLE deficits/detail RLE Deficits / Details: unable to perform SLR       Communication   Communication: HOH  Cognition Arousal/Alertness: Awake/alert Behavior During Therapy: WFL for tasks  assessed/performed Overall Cognitive Status: Within Functional Limits for tasks assessed                      General Comments      Exercises     Assessment/Plan    PT Assessment Patient needs continued PT services  PT Problem List Decreased strength;Decreased range of motion;Decreased activity tolerance;Decreased balance;Decreased mobility          PT  Treatment Interventions DME instruction;Gait training;Stair training;Functional mobility training;Therapeutic activities;Therapeutic exercise;Patient/family education    PT Goals (Current goals can be found in the Care Plan section)  Acute Rehab PT Goals Patient Stated Goal: [place] PT Goal Formulation: With patient Time For Goal Achievement: 12/11/15 Potential to Achieve Goals: Good    Frequency 7X/week   Barriers to discharge Decreased caregiver support      Co-evaluation               End of Session Equipment Utilized During Treatment: Gait belt;Right knee immobilizer Activity Tolerance: Patient tolerated treatment well Patient left: in chair;with call bell/phone within reach Nurse Communication: Mobility status         Time: [id number] PT Time Calculation (min) (ACUTE ONLY): 31 min   Charges:   PT Evaluation $PT Eval Moderate Complexity: 1 Procedure PT Treatments $Gait Training: 8-22 mins   PT G Codes:        Cassell Clement, PT, CSCS Pager [phone] Office [phone]  11/27/2015, 10:06 AM

## 2015-11-27 NOTE — Progress Notes (Signed)
OT Cancellation Note  Patient Details Name: ARNEISHA KINCANNON MRN: [id number] DOB: 1925-10-15   Cancelled Treatment:    Reason Eval/Treat Not Completed: Other (comment) (All OT needs can be met at next venue of care, defer to SNF)  Merri Ray Bryker Fletchall 11/27/2015, 4:13 PM  Hulda Humphrey OTR/L [phone]

## 2015-11-27 NOTE — Progress Notes (Signed)
Physical Therapy Treatment Patient Details Name: Alexandria Davies MRN: [id number] DOB: 1926/01/19 Today's Date: 11/27/2015    History of Present Illness Pt is an 82 y.o. female now s/p Rt TKA. PMH: anxiety.     PT Comments    Pt making gradual progress with PT, able to ambulate 45 ft with rw and min guard assistance. PT continuing to recommend SNF for further rehabilitation prior to returning home. Will continue to follow and progress mobility to maximize independence and safety.   Follow Up Recommendations  SNF;Supervision for mobility/OOB     Equipment Recommendations  None recommended by PT    Recommendations for Other Services       Precautions / Restrictions Precautions Precautions: Knee;Fall Required Braces or Orthoses: Knee Immobilizer - Right Restrictions Weight Bearing Restrictions: Yes RLE Weight Bearing: Weight bearing as tolerated    Mobility  Bed Mobility               General bed mobility comments: pt found up in bathroom upon arrival. Stressed to pt need to have assistance by staff when up.   Transfers Overall transfer level: Needs assistance Equipment used: Rolling walker (2 wheeled) Transfers: Sit to/from Stand Sit to Stand: Min guard         General transfer comment: cues for hand position  Ambulation/Gait Ambulation/Gait assistance: Min guard Ambulation Distance (Feet): 45 Feet Assistive device: Rolling walker (2 wheeled) Gait Pattern/deviations: Step-to pattern;Decreased stance time - right;Decreased step length - left Gait velocity: decreased   General Gait Details: cues for even strides and progressing weightbearing as tolerated.    Stairs            Wheelchair Mobility    Modified Rankin (Stroke Patients Only)       Balance Overall balance assessment: Needs assistance Sitting-balance support: No upper extremity supported Sitting balance-Leahy Scale: Good     Standing balance support: Bilateral upper extremity  supported Standing balance-Leahy Scale: Poor Standing balance comment: using rw for support                    Cognition Arousal/Alertness: Awake/alert Behavior During Therapy: WFL for tasks assessed/performed Overall Cognitive Status: Within Functional Limits for tasks assessed                      Exercises Total Joint Exercises Ankle Circles/Pumps: AROM;Both;15 reps Quad Sets: Strengthening;Right;10 reps (verbal and tactile cues) Heel Slides: AAROM;Right;10 reps Straight Leg Raises: Strengthening;Right;10 reps (max assist) Goniometric ROM: 69 degrees knee flexion    General Comments        Pertinent Vitals/Pain Pain Assessment: 0-10 Pain Score: 6  Pain Location: Rt knee Pain Descriptors / Indicators: Aching Pain Intervention(s): Limited activity within patient's tolerance;Monitored during session    Home Living                      Prior Function            PT Goals (current goals can now be found in the care plan section) Acute Rehab PT Goals Patient Stated Goal: not expressed PT Goal Formulation: With patient Time For Goal Achievement: 12/11/15 Potential to Achieve Goals: Good Progress towards PT goals: Progressing toward goals    Frequency    7X/week      PT Plan Current plan remains appropriate    Co-evaluation             End of Session Equipment Utilized During Treatment: Gait belt;Right knee  immobilizer Activity Tolerance: Patient tolerated treatment well Patient left: in chair;with call bell/phone within reach     Time: 1334-1401 PT Time Calculation (min) (ACUTE ONLY): 27 min  Charges:  $Gait Training: 8-22 mins $Therapeutic Exercise: 8-22 mins                    G Codes:      Cassell Clement, PT, CSCS Pager [phone] Office [phone]  11/27/2015, 2:22 PM

## 2015-11-27 NOTE — Progress Notes (Signed)
Subjective: 1 Day Post-Op Procedure(s) (LRB): TOTAL KNEE ARTHROPLASTY (Right)  Activity level:  wbat Diet tolerance:  ok Voiding:  Foley out this morning Patient reports pain as mild and moderate.    Objective: Vital signs in last 24 hours: Temp:  [97 F (36.1 C)-98.7 F (37.1 C)] 98.1 F (36.7 C) (09/27 0500) Pulse Rate:  [55-71] 60 (09/27 0500) Resp:  [14-20] 17 (09/27 0500) BP: (106-170)/(45-93) 106/50 (09/27 0500) SpO2:  [94 %-100 %] 96 % (09/27 0500) Weight:  [60.8 kg (134 lb)] 60.8 kg (134 lb) (09/26 1140)  Labs: No results for input(s): HGB in the last 72 hours. No results for input(s): WBC, RBC, HCT, PLT in the last 72 hours. No results for input(s): NA, K, CL, CO2, BUN, CREATININE, GLUCOSE, CALCIUM in the last 72 hours. No results for input(s): LABPT, INR in the last 72 hours.  Physical Exam:  Neurologically intact ABD soft Neurovascular intact Sensation intact distally Intact pulses distally Dorsiflexion/Plantar flexion intact Incision: dressing C/D/I and no drainage No cellulitis present Compartment soft  Assessment/Plan:  1 Day Post-Op Procedure(s) (LRB): TOTAL KNEE ARTHROPLASTY (Right) Advance diet Up with therapy D/C IV fluids Plan for discharge tomorrow to either SNF or home depending on how she is doing. Continue on ASA 325mg  BID x 2 weeks post op. Follow up in office 2 weeks post op.   Alexandria Davies, Larwance Sachs 11/27/2015, 8:21 AM

## 2015-11-28 DIAGNOSIS — F411 Generalized anxiety disorder: Secondary | ICD-10-CM | POA: Diagnosis not present

## 2015-11-28 DIAGNOSIS — R531 Weakness: Secondary | ICD-10-CM | POA: Diagnosis not present

## 2015-11-28 DIAGNOSIS — I10 Essential (primary) hypertension: Secondary | ICD-10-CM | POA: Diagnosis not present

## 2015-11-28 DIAGNOSIS — M6281 Muscle weakness (generalized): Secondary | ICD-10-CM | POA: Diagnosis not present

## 2015-11-28 DIAGNOSIS — Z96659 Presence of unspecified artificial knee joint: Secondary | ICD-10-CM | POA: Diagnosis not present

## 2015-11-28 DIAGNOSIS — Z4733 Aftercare following explantation of knee joint prosthesis: Secondary | ICD-10-CM | POA: Diagnosis not present

## 2015-11-28 DIAGNOSIS — R2681 Unsteadiness on feet: Secondary | ICD-10-CM | POA: Diagnosis not present

## 2015-11-28 DIAGNOSIS — M1711 Unilateral primary osteoarthritis, right knee: Secondary | ICD-10-CM | POA: Diagnosis not present

## 2015-11-28 DIAGNOSIS — Z96651 Presence of right artificial knee joint: Secondary | ICD-10-CM | POA: Diagnosis not present

## 2015-11-28 DIAGNOSIS — M25569 Pain in unspecified knee: Secondary | ICD-10-CM | POA: Diagnosis not present

## 2015-11-28 DIAGNOSIS — M25469 Effusion, unspecified knee: Secondary | ICD-10-CM | POA: Diagnosis not present

## 2015-11-28 DIAGNOSIS — M25561 Pain in right knee: Secondary | ICD-10-CM | POA: Diagnosis not present

## 2015-11-28 DIAGNOSIS — R279 Unspecified lack of coordination: Secondary | ICD-10-CM | POA: Diagnosis not present

## 2015-11-28 MED ORDER — ASPIRIN 325 MG PO TBEC: 325.0000 mg | DELAYED_RELEASE_TABLET | Freq: Two times a day (BID) | ORAL | 0 refills | Status: DC

## 2015-11-28 MED ORDER — METHOCARBAMOL 500 MG PO TABS: 500.0000 mg | ORAL_TABLET | Freq: Four times a day (QID) | ORAL | 0 refills | Status: DC | PRN

## 2015-11-28 MED ORDER — HYDROCODONE-ACETAMINOPHEN 5-325 MG PO TABS: 1.0000 | ORAL_TABLET | ORAL | 0 refills | Status: DC | PRN

## 2015-11-28 MED ORDER — BISACODYL 10 MG RE SUPP
10.0000 mg | Freq: Once | RECTAL | Status: AC
  Administered 2015-11-28: 10 mg via RECTAL
  Filled 2015-11-28: qty 1

## 2015-11-28 NOTE — Clinical Social Work Placement (Signed)
   CLINICAL SOCIAL WORK PLACEMENT  NOTE  Date:  11/28/2015  Patient Details  Name: Alexandria Davies MRN: [id number] Date of Birth: [birth year]  Clinical Social Work is seeking post-discharge placement for this patient at the [place] level of care (*CSW will initial, date and re-position this form in  chart as items are completed):  Yes   Patient/family provided with [facility] Work Department's list of facilities offering this level of care within the geographic area requested by the patient (or if unable, by the patient's family).  Yes   Patient/family informed of their freedom to choose among providers that offer the needed level of care, that participate in Medicare, Medicaid or managed care program needed by the patient, have an available bed and are willing to accept the patient.  Yes   Patient/family informed of [facility]'s ownership interest in [facility] and [facility], as well as of the fact that they are under no obligation to receive care at these facilities.  PASRR submitted to EDS on 11/27/15     PASRR number received on 11/27/15     Existing PASRR number confirmed on       FL2 transmitted to all facilities in geographic area requested by pt/family on 11/27/15     FL2 transmitted to all facilities within larger geographic area on       Patient informed that his/her managed care company has contracts with or will negotiate with certain facilities, including the following:        Yes   Patient/family informed of bed offers received.  Patient chooses bed at [place], [place]     Physician recommends and patient chooses bed at [place], [place]    Patient to be transferred to [place], [place] on 11/28/15.  Patient to be transferred to facility by PTAR     Patient family notified on 11/28/15 of transfer.  Name of family member notified:  Pt's notified her husband.      PHYSICIAN Please prepare priority  discharge summary, including medications     Additional Comment:    _______________________________________________ Darden Dates, LCSW 11/28/2015, 4:26 PM

## 2015-11-28 NOTE — Clinical Social Work Note (Signed)
Pt is ready for discharge today and will go to [facility]. Pt is aware and agreeable to discharge plan. Facility is ready to admit pt as they have received discharge information. Pt notified her husband. RN called report and PTAR will provide transportation. CSW is signing off as no further needs identified.   Darden Dates, MSW, LCSW  Clinical Social Worker  [phone]

## 2015-11-28 NOTE — Progress Notes (Signed)
Report called to Bennie Pierini at [facility]. All questions/concerns addressed. IV removed and PTAR to transport pt out. Will continue to monitor

## 2015-11-28 NOTE — Discharge Summary (Signed)
[name] MRN: [id number] DOB/AGE: 1925/08/16 82 y.o.  Admit date: 11/26/2015 Discharge date: 11/28/2015  Admission Diagnoses:  Principal Problem:   Primary osteoarthritis of right knee   Discharge Diagnoses:  Same  Past Medical History:  Diagnosis Date  . Anxiety   . Breast cancer ([place])    left  . Colon polyp 2009   BENIGN POLYPOID  . Diverticulosis of colon (without mention of hemorrhage) 2009  . DJD (degenerative joint disease)   . Family history of malignant neoplasm of gastrointestinal tract 05/06/2011  . HOH (hard of hearing)    Right ear  . Hypercholesteremia   . Hypothyroidism   . IBS (irritable bowel syndrome)   . Internal hemorrhoids without mention of complication   . Sinus drainage   . Vitiligo   . Wears dentures    top  . Wears glasses     Surgeries: Procedure(s): TOTAL KNEE ARTHROPLASTY on 11/26/2015   Consultants:   Discharged Condition: Improved  Hospital Course: Alexandria Davies is an 82 y.o. female who was admitted 11/26/2015 for operative treatment ofPrimary osteoarthritis of right knee. Patient has severe unremitting pain that affects sleep, daily activities, and work/hobbies. After pre-op clearance the patient was taken to the operating room on 11/26/2015 and underwent  Procedure(s): TOTAL KNEE ARTHROPLASTY.    Patient was given perioperative antibiotics: Anti-infectives    Start     Dose/Rate Route Frequency Ordered Stop   11/26/15 2000  ceFAZolin (ANCEF) IVPB 2g/100 mL premix     2 g 200 mL/hr over 30 Minutes Intravenous Every 6 hours 11/26/15 1901 11/27/15 0535   11/26/15 1039  ceFAZolin (ANCEF) IVPB 2g/100 mL premix     2 g 200 mL/hr over 30 Minutes Intravenous On call to O.R. 11/26/15 1039 11/26/15 1357       Patient was given sequential compression devices, early ambulation, and chemoprophylaxis to prevent DVT.  Patient benefited maximally from hospital stay and there were no complications.    Recent vital signs: Patient  Vitals for the past 24 hrs:  BP Temp Temp src Pulse Resp SpO2  11/28/15 1229 (!) 108/47 99 F (37.2 C) Oral 66 16 95 %  11/28/15 0453 (!) 117/42 97.6 F (36.4 C) Oral 61 16 97 %  11/27/15 2057 (!) 109/48 97.8 F (36.6 C) Oral 70 16 97 %     Recent laboratory studies: No results for input(s): WBC, HGB, HCT, PLT, NA, K, CL, CO2, BUN, CREATININE, GLUCOSE, INR, CALCIUM in the last 72 hours.  Invalid input(s): PT, 2   Discharge Medications:     Medication List    STOP taking these medications   ibuprofen 200 MG tablet Commonly known as:  ADVIL,MOTRIN   meloxicam 15 MG tablet Commonly known as:  MOBIC   naproxen sodium 220 MG tablet Commonly known as:  ANAPROX     TAKE these medications   aspirin 325 MG EC tablet Take 1 tablet (325 mg total) by mouth 2 (two) times daily after a meal.   Azelastine-Fluticasone 137-50 MCG/ACT Susp Place 1-2 puffs into the nose at bedtime.   CALCIUM 600+D HIGH POTENCY 600-400 MG-UNIT tablet Generic drug:  Calcium Carbonate-Vitamin D Take 1 tablet by mouth daily.   cholecalciferol 1000 units tablet Commonly known as:  VITAMIN D Take 2,000 Units by mouth 2 (two) times daily.   clotrimazole-betamethasone cream Commonly known as:  LOTRISONE Apply 1 application topically 2 (two) times daily.   diazepam 5 MG tablet Commonly known as:  VALIUM  Take 1 tablet (5 mg total) by mouth every 8 (eight) hours as needed for anxiety.   HYDROcodone-acetaminophen 5-325 MG tablet Commonly known as:  NORCO/VICODIN Take 1-2 tablets by mouth every 4 (four) hours as needed (breakthrough pain).   levothyroxine 150 MCG tablet Commonly known as:  SYNTHROID Take 1 tablet by mouth  daily   methocarbamol 500 MG tablet Commonly known as:  ROBAXIN Take 1 tablet (500 mg total) by mouth every 6 (six) hours as needed for muscle spasms.   mirtazapine 30 MG tablet Commonly known as:  REMERON Take 1 tablet (30 mg total) by mouth at bedtime. As directed.    Potassium Gluconate 595 MG Caps Take 595 mg by mouth daily.   PROBIOTIC PO Take 1 tablet by mouth daily.   simvastatin 20 MG tablet Commonly known as:  ZOCOR Take 1 tablet by mouth  every night at bedtime   vitamin C 500 MG tablet Commonly known as:  ASCORBIC ACID Take 500 mg by mouth daily.   vitamin E 400 UNIT capsule Generic drug:  vitamin E Take 400 Units by mouth every other day.       Diagnostic Studies: Dg Chest 2 View  Result Date: 11/15/2015 CLINICAL DATA:  Preop for total knee arthroplasty EXAM: CHEST  2 VIEW COMPARISON:  10/03/2009 FINDINGS: Cardiomediastinal silhouette is stable. No acute infiltrate or pleural effusion. No pulmonary edema. Mild degenerative changes mid thoracic spine. IMPRESSION: No active cardiopulmonary disease. Electronically Signed   By: Lahoma Crocker M.D.   On: 11/15/2015 15:11    Disposition: 01-Home or Self Care  Discharge Instructions    Call MD / Call 911    Complete by:  As directed    If you experience chest pain or shortness of breath, CALL 911 and be transported to the hospital emergency room.  If you develope a fever above 101 F, pus (white drainage) or increased drainage or redness at the wound, or calf pain, call your surgeon's office.   Constipation Prevention    Complete by:  As directed    Drink plenty of fluids.  Prune juice may be helpful.  You may use a stool softener, such as Colace (over the counter) 100 mg twice a day.  Use MiraLax (over the counter) for constipation as needed.   Diet - low sodium heart healthy    Complete by:  As directed    Discharge instructions    Complete by:  As directed    INSTRUCTIONS AFTER JOINT REPLACEMENT   Remove items at home which could result in a fall. This includes throw rugs or furniture in walking pathways ICE to the affected joint every three hours while awake for 30 minutes at a time, for at least the first 3-5 days, and then as needed for pain and swelling.  Continue to use ice for  pain and swelling. You may notice swelling that will progress down to the foot and ankle.  This is normal after surgery.  Elevate your leg when you are not up walking on it.   Continue to use the breathing machine you got in the hospital (incentive spirometer) which will help keep your temperature down.  It is common for your temperature to cycle up and down following surgery, especially at night when you are not up moving around and exerting yourself.  The breathing machine keeps your lungs expanded and your temperature down.   DIET:  As you were doing prior to hospitalization, we recommend a well-balanced diet.  DRESSING / WOUND CARE / SHOWERING  You may shower 3 days after surgery, but keep the wounds dry during showering.  You may use an occlusive plastic wrap (Press'n Seal for example), NO SOAKING/SUBMERGING IN THE BATHTUB.  If the bandage gets wet, change with a clean dry gauze.  If the incision gets wet, pat the wound dry with a clean towel.  ACTIVITY  Increase activity slowly as tolerated, but follow the weight bearing instructions below.   No driving for 6 weeks or until further direction given by your physician.  You cannot drive while taking narcotics.  No lifting or carrying greater than 10 lbs. until further directed by your surgeon. Avoid periods of inactivity such as sitting longer than an hour when not asleep. This helps prevent blood clots.  You may return to work once you are authorized by your doctor.     WEIGHT BEARING   Weight bearing as tolerated with assist device (walker, cane, etc) as directed, use it as long as suggested by your surgeon or therapist, typically at least 4-6 weeks.   EXERCISES  Results after joint replacement surgery are often greatly improved when you follow the exercise, range of motion and muscle strengthening exercises prescribed by your doctor. Safety measures are also important to protect the joint from further injury. Any time any of these  exercises cause you to have increased pain or swelling, decrease what you are doing until you are comfortable again and then slowly increase them. If you have problems or questions, call your caregiver or physical therapist for advice.   Rehabilitation is important following a joint replacement. After just a few days of immobilization, the muscles of the leg can become weakened and shrink (atrophy).  These exercises are designed to build up the tone and strength of the thigh and leg muscles and to improve motion. Often times heat used for twenty to thirty minutes before working out will loosen up your tissues and help with improving the range of motion but do not use heat for the first two weeks following surgery (sometimes heat can increase post-operative swelling).   These exercises can be done on a training (exercise) mat, on the floor, on a table or on a bed. Use whatever works the best and is most comfortable for you.    Use music or television while you are exercising so that the exercises are a pleasant break in your day. This will make your life better with the exercises acting as a break in your routine that you can look forward to.   Perform all exercises about fifteen times, three times per day or as directed.  You should exercise both the operative leg and the other leg as well.   Exercises include:   Quad Sets - Tighten up the muscle on the front of the thigh (Quad) and hold for 5-10 seconds.   Straight Leg Raises - With your knee straight (if you were given a brace, keep it on), lift the leg to 60 degrees, hold for 3 seconds, and slowly lower the leg.  Perform this exercise against resistance later as your leg gets stronger.  Leg Slides: Lying on your back, slowly slide your foot toward your buttocks, bending your knee up off the floor (only go as far as is comfortable). Then slowly slide your foot back down until your leg is flat on the floor again.  Angel Wings: Lying on your back spread  your legs to the side as far apart as  you can without causing discomfort.  Hamstring Strength:  Lying on your back, push your heel against the floor with your leg straight by tightening up the muscles of your buttocks.  Repeat, but this time bend your knee to a comfortable angle, and push your heel against the floor.  You may put a pillow under the heel to make it more comfortable if necessary.   A rehabilitation program following joint replacement surgery can speed recovery and prevent re-injury in the future due to weakened muscles. Contact your doctor or a physical therapist for more information on knee rehabilitation.    CONSTIPATION  Constipation is defined medically as fewer than three stools per week and severe constipation as less than one stool per week.  Even if you have a regular bowel pattern at home, your normal regimen is likely to be disrupted due to multiple reasons following surgery.  Combination of anesthesia, postoperative narcotics, change in appetite and fluid intake all can affect your bowels.   YOU MUST use at least one of the following options; they are listed in order of increasing strength to get the job done.  They are all available over the counter, and you may need to use some, POSSIBLY even all of these options:    Drink plenty of fluids (prune juice may be helpful) and high fiber foods Colace 100 mg by mouth twice a day  Senokot for constipation as directed and as needed Dulcolax (bisacodyl), take with full glass of water  Miralax (polyethylene glycol) once or twice a day as needed.  If you have tried all these things and are unable to have a bowel movement in the first 3-4 days after surgery call either your surgeon or your primary doctor.    If you experience loose stools or diarrhea, hold the medications until you stool forms back up.  If your symptoms do not get better within 1 week or if they get worse, check with your doctor.  If you experience "the worst  abdominal pain ever" or develop nausea or vomiting, please contact the office immediately for further recommendations for treatment.   ITCHING:  If you experience itching with your medications, try taking only a single pain pill, or even half a pain pill at a time.  You can also use Benadryl over the counter for itching or also to help with sleep.   TED HOSE STOCKINGS:  Use stockings on both legs until for at least 2 weeks or as directed by physician office. They may be removed at night for sleeping.  MEDICATIONS:  See your medication summary on the "After Visit Summary" that nursing will review with you.  You may have some home medications which will be placed on hold until you complete the course of blood thinner medication.  It is important for you to complete the blood thinner medication as prescribed.  PRECAUTIONS:  If you experience chest pain or shortness of breath - call 911 immediately for transfer to the hospital emergency department.   If you develop a fever greater that 101 F, purulent drainage from wound, increased redness or drainage from wound, foul odor from the wound/dressing, or calf pain - CONTACT YOUR SURGEON.                                                   FOLLOW-UP APPOINTMENTS:  If you do not already have a post-op appointment, please call the office for an appointment to be seen by your surgeon.  Guidelines for how soon to be seen are listed in your "After Visit Summary", but are typically between 1-4 weeks after surgery.  OTHER INSTRUCTIONS:   Knee Replacement:  Do not place pillow under knee, focus on keeping the knee straight while resting. CPM instructions: 0-90 degrees, 2 hours in the morning, 2 hours in the afternoon, and 2 hours in the evening. Place foam block, curve side up under heel at all times except when in CPM or when walking.  DO NOT modify, tear, cut, or change the foam block in any way.  MAKE SURE YOU:  Understand these instructions.  Get help right  away if you are not doing well or get worse.    Thank you for letting us be a part of your medical care team.  It is a privilege we respect greatly.  We hope these instructions will help you stay on track for a fast and full recovery!   Increase activity slowly as tolerated    Complete by:  As directed       Follow-up Information    DALLDORF,PETER G, MD. Schedule an appointment as soon as possible for a visit in 2 weeks.   Specialty:  Orthopedic Surgery Contact information: [place] [id number] [phone]            Signed: Rich Fuchs 11/28/2015, 1:39 PM

## 2015-11-28 NOTE — Progress Notes (Signed)
Physical Therapy Treatment Patient Details Name: Alexandria Davies MRN: [id number] DOB: [birth year] Today's Date: 11/28/2015    History of Present Illness Pt is an 82 y.o. female now s/p Rt TKA. PMH: anxiety.     PT Comments    Pt performed increased mobility and progressed distance and activity tolerance.  Pt required mod VCs and AAROM to complete supine exercises.    Follow Up Recommendations  SNF;Supervision for mobility/OOB     Equipment Recommendations  None recommended by PT    Recommendations for Other Services       Precautions / Restrictions Precautions Precautions: Knee;Fall Precaution Booklet Issued: Yes (comment) Precaution Comments: HEP provided, reviewed knee extension precautions Restrictions Weight Bearing Restrictions: Yes RLE Weight Bearing: Weight bearing as tolerated    Mobility  Bed Mobility Overal bed mobility: Needs Assistance Bed Mobility: Supine to Sit     Supine to sit: Min assist     General bed mobility comments: Pt required assist to advance LEs to edge of bed.    Transfers Overall transfer level: Needs assistance Equipment used: Rolling walker (2 wheeled) Transfers: Sit to/from Stand Sit to Stand: Min guard         General transfer comment: Cues for hand placement, demonstrates poor eccentric loading and required cues for forward advancement of RLE to avoid throwing herself back into chair.    Ambulation/Gait Ambulation/Gait assistance: Min guard Ambulation Distance (Feet): 101 Feet Assistive device: Rolling walker (2 wheeled) Gait Pattern/deviations: Step-to pattern;Decreased stance time - right;Trunk flexed;Shuffle Gait velocity: decreased   General Gait Details: Cues for R heel strike and Knee extension.  Cues for forward gaze and RW safety.     Stairs            Wheelchair Mobility    Modified Rankin (Stroke Patients Only)       Balance Overall balance assessment: Needs assistance   Sitting balance-Leahy  Scale: Good       Standing balance-Leahy Scale: Poor                      Cognition Arousal/Alertness: Awake/alert Behavior During Therapy: WFL for tasks assessed/performed Overall Cognitive Status: Within Functional Limits for tasks assessed                      Exercises Total Joint Exercises Ankle Circles/Pumps: AROM;Right;10 reps;Supine Quad Sets: AROM;Right;10 reps;Supine Towel Squeeze: AROM;Both;10 reps Short Arc Quad: Right;10 reps;Supine;AAROM Heel Slides: AAROM;Right;10 reps Hip ABduction/ADduction: AAROM;Right;10 reps;Supine Straight Leg Raises: AAROM;Right;10 reps;Supine Goniometric ROM: 70 degrees knee flexion    General Comments        Pertinent Vitals/Pain Pain Assessment: 0-10 Pain Score: 5  Pain Location: R knee Pain Descriptors / Indicators: Aching Pain Intervention(s): Monitored during session;Repositioned    Home Living                      Prior Function            PT Goals (current goals can now be found in the care plan section) Acute Rehab PT Goals Patient Stated Goal: not expressed Potential to Achieve Goals: Good Progress towards PT goals: Progressing toward goals    Frequency    7X/week      PT Plan Current plan remains appropriate    Co-evaluation             End of Session Equipment Utilized During Treatment: Gait belt;Right knee immobilizer Activity Tolerance: Patient tolerated treatment well  Patient left: in chair;with call bell/phone within reach     Time: 1109-1140 PT Time Calculation (min) (ACUTE ONLY): 31 min  Charges:  $Gait Training: 8-22 mins $Therapeutic Exercise: 8-22 mins                    G Codes:      Cristela Blue December 01, 2015, 11:45 AM  Governor Rooks, PTA pager [phone]

## 2015-11-28 NOTE — Progress Notes (Signed)
Orthopedic Tech Progress Note Patient Details:  Alexandria Davies [birth year] [id number]  [name], female   DOB: [birth year], 82 y.o.   MRN: [id number] Applied cpm 0-40. Pt was having to much pain for it to be higher. Already had pain meds   Karolee Stamps 11/28/2015, 6:06 AM

## 2015-11-29 DIAGNOSIS — I10 Essential (primary) hypertension: Secondary | ICD-10-CM | POA: Diagnosis not present

## 2015-11-29 DIAGNOSIS — Z96659 Presence of unspecified artificial knee joint: Secondary | ICD-10-CM | POA: Diagnosis not present

## 2015-11-29 DIAGNOSIS — F411 Generalized anxiety disorder: Secondary | ICD-10-CM | POA: Diagnosis not present

## 2015-12-01 DIAGNOSIS — M25469 Effusion, unspecified knee: Secondary | ICD-10-CM | POA: Diagnosis not present

## 2015-12-04 DIAGNOSIS — M25561 Pain in right knee: Secondary | ICD-10-CM | POA: Diagnosis not present

## 2015-12-04 DIAGNOSIS — R2681 Unsteadiness on feet: Secondary | ICD-10-CM | POA: Diagnosis not present

## 2015-12-06 DIAGNOSIS — M1711 Unilateral primary osteoarthritis, right knee: Secondary | ICD-10-CM | POA: Diagnosis not present

## 2015-12-06 DIAGNOSIS — M25561 Pain in right knee: Secondary | ICD-10-CM | POA: Diagnosis not present

## 2015-12-06 DIAGNOSIS — R2681 Unsteadiness on feet: Secondary | ICD-10-CM | POA: Diagnosis not present

## 2015-12-09 DIAGNOSIS — M25561 Pain in right knee: Secondary | ICD-10-CM | POA: Diagnosis not present

## 2015-12-09 DIAGNOSIS — R2681 Unsteadiness on feet: Secondary | ICD-10-CM | POA: Diagnosis not present

## 2015-12-11 DIAGNOSIS — R2681 Unsteadiness on feet: Secondary | ICD-10-CM | POA: Diagnosis not present

## 2015-12-11 DIAGNOSIS — M25561 Pain in right knee: Secondary | ICD-10-CM | POA: Diagnosis not present

## 2015-12-13 DIAGNOSIS — M25561 Pain in right knee: Secondary | ICD-10-CM | POA: Diagnosis not present

## 2015-12-13 DIAGNOSIS — R2681 Unsteadiness on feet: Secondary | ICD-10-CM | POA: Diagnosis not present

## 2015-12-16 DIAGNOSIS — M25561 Pain in right knee: Secondary | ICD-10-CM | POA: Diagnosis not present

## 2015-12-16 DIAGNOSIS — R2681 Unsteadiness on feet: Secondary | ICD-10-CM | POA: Diagnosis not present

## 2015-12-18 DIAGNOSIS — M25561 Pain in right knee: Secondary | ICD-10-CM | POA: Diagnosis not present

## 2015-12-18 DIAGNOSIS — R2681 Unsteadiness on feet: Secondary | ICD-10-CM | POA: Diagnosis not present

## 2015-12-20 DIAGNOSIS — M25561 Pain in right knee: Secondary | ICD-10-CM | POA: Diagnosis not present

## 2015-12-20 DIAGNOSIS — R2681 Unsteadiness on feet: Secondary | ICD-10-CM | POA: Diagnosis not present

## 2015-12-23 DIAGNOSIS — R2681 Unsteadiness on feet: Secondary | ICD-10-CM | POA: Diagnosis not present

## 2015-12-23 DIAGNOSIS — M25561 Pain in right knee: Secondary | ICD-10-CM | POA: Diagnosis not present

## 2015-12-25 DIAGNOSIS — R2681 Unsteadiness on feet: Secondary | ICD-10-CM | POA: Diagnosis not present

## 2015-12-25 DIAGNOSIS — M25561 Pain in right knee: Secondary | ICD-10-CM | POA: Diagnosis not present

## 2015-12-27 DIAGNOSIS — M1711 Unilateral primary osteoarthritis, right knee: Secondary | ICD-10-CM | POA: Diagnosis not present

## 2015-12-30 DIAGNOSIS — R2681 Unsteadiness on feet: Secondary | ICD-10-CM | POA: Diagnosis not present

## 2015-12-30 DIAGNOSIS — M25561 Pain in right knee: Secondary | ICD-10-CM | POA: Diagnosis not present

## 2016-01-01 DIAGNOSIS — R2681 Unsteadiness on feet: Secondary | ICD-10-CM | POA: Diagnosis not present

## 2016-01-01 DIAGNOSIS — M25561 Pain in right knee: Secondary | ICD-10-CM | POA: Diagnosis not present

## 2016-01-06 DIAGNOSIS — R2681 Unsteadiness on feet: Secondary | ICD-10-CM | POA: Diagnosis not present

## 2016-01-06 DIAGNOSIS — M25561 Pain in right knee: Secondary | ICD-10-CM | POA: Diagnosis not present

## 2016-01-09 DIAGNOSIS — R2681 Unsteadiness on feet: Secondary | ICD-10-CM | POA: Diagnosis not present

## 2016-01-09 DIAGNOSIS — M25561 Pain in right knee: Secondary | ICD-10-CM | POA: Diagnosis not present

## 2016-01-14 DIAGNOSIS — M25561 Pain in right knee: Secondary | ICD-10-CM | POA: Diagnosis not present

## 2016-01-14 DIAGNOSIS — R2681 Unsteadiness on feet: Secondary | ICD-10-CM | POA: Diagnosis not present

## 2016-01-25 ENCOUNTER — Other Ambulatory Visit: Payer: Self-pay | Admitting: Internal Medicine

## 2016-01-29 DIAGNOSIS — Z96651 Presence of right artificial knee joint: Secondary | ICD-10-CM | POA: Diagnosis not present

## 2016-01-29 DIAGNOSIS — M1711 Unilateral primary osteoarthritis, right knee: Secondary | ICD-10-CM | POA: Diagnosis not present

## 2016-01-30 ENCOUNTER — Ambulatory Visit (INDEPENDENT_AMBULATORY_CARE_PROVIDER_SITE_OTHER): Payer: Medicare Other | Admitting: Internal Medicine

## 2016-01-30 VITALS — BP 124/68 | HR 68 | Temp 97.3°F | Resp 16 | Ht 64.5 in | Wt 130.4 lb

## 2016-01-30 DIAGNOSIS — E559 Vitamin D deficiency, unspecified: Secondary | ICD-10-CM | POA: Diagnosis not present

## 2016-01-30 DIAGNOSIS — R7309 Other abnormal glucose: Secondary | ICD-10-CM

## 2016-01-30 DIAGNOSIS — J3089 Other allergic rhinitis: Secondary | ICD-10-CM | POA: Diagnosis not present

## 2016-01-30 DIAGNOSIS — R634 Abnormal weight loss: Secondary | ICD-10-CM

## 2016-01-30 DIAGNOSIS — R030 Elevated blood-pressure reading, without diagnosis of hypertension: Secondary | ICD-10-CM | POA: Diagnosis not present

## 2016-01-30 DIAGNOSIS — R7303 Prediabetes: Secondary | ICD-10-CM | POA: Diagnosis not present

## 2016-01-30 DIAGNOSIS — Z79899 Other long term (current) drug therapy: Secondary | ICD-10-CM

## 2016-01-30 LAB — BASIC METABOLIC PANEL WITH GFR
BUN: 19 mg/dL (ref 7–25)
CHLORIDE: 106 mmol/L (ref 98–110)
CO2: 26 mmol/L (ref 20–31)
Calcium: 10.1 mg/dL (ref 8.6–10.4)
Creat: 0.81 mg/dL (ref 0.60–0.88)
GFR, EST AFRICAN AMERICAN: 75 mL/min (ref >=60)
GFR, EST NON AFRICAN AMERICAN: 65 mL/min (ref >=60)
Glucose, Bld: 91 mg/dL (ref 65–99)
POTASSIUM: 4.5 mmol/L (ref 3.5–5.3)
SODIUM: 142 mmol/L (ref 135–146)

## 2016-01-30 LAB — CBC WITH DIFFERENTIAL/PLATELET
BASOS PCT: 0 %
Basophils Absolute: 0 cells/uL (ref 0–200)
Eosinophils Absolute: 68 cells/uL (ref 15–500)
Eosinophils Relative: 1 %
HCT: 39.8 % (ref 35.0–45.0)
Hemoglobin: 12.7 g/dL (ref 11.7–15.5)
LYMPHS PCT: 25 %
Lymphs Abs: 1700 cells/uL (ref 850–3900)
MCH: 28.6 pg (ref 27.0–33.0)
MCHC: 31.9 g/dL — ABNORMAL LOW (ref 32.0–36.0)
MCV: 89.6 fL (ref 80.0–100.0)
MONO ABS: 612 {cells}/uL (ref 200–950)
MONOS PCT: 9 %
MPV: 9.5 fL (ref 7.5–12.5)
NEUTROS ABS: 4420 {cells}/uL (ref 1500–7800)
Neutrophils Relative %: 65 %
PLATELETS: 258 10*3/uL (ref 140–400)
RBC: 4.44 MIL/uL (ref 3.80–5.10)
RDW: 14.1 % (ref 11.0–15.0)
WBC: 6.8 10*3/uL (ref 3.8–10.8)

## 2016-01-30 LAB — HEPATIC FUNCTION PANEL
ALBUMIN: 4.1 g/dL (ref 3.6–5.1)
ALK PHOS: 65 U/L (ref 33–130)
ALT: 9 U/L (ref 6–29)
AST: 14 U/L (ref 10–35)
BILIRUBIN DIRECT: 0.2 mg/dL (ref <=0.2)
BILIRUBIN INDIRECT: 0.7 mg/dL (ref 0.2–1.2)
BILIRUBIN TOTAL: 0.9 mg/dL (ref 0.2–1.2)
Total Protein: 6.3 g/dL (ref 6.1–8.1)

## 2016-01-30 LAB — MAGNESIUM: Magnesium: 1.9 mg/dL (ref 1.5–2.5)

## 2016-01-30 MED ORDER — MIRTAZAPINE 30 MG PO TABS: ORAL_TABLET | ORAL | 1 refills | Status: DC

## 2016-01-30 NOTE — Patient Instructions (Addendum)
Restart Remeron at bedtime  ============================  Try Certirizine  (Zyrtec) 10 mg daily for allergies  ++++++++++++++++++++++++++++ Recommend Adult Low Dose Aspirin or  coated  Aspirin 81 mg daily  To reduce risk of Colon Cancer 20 %,  Skin Cancer 26 % ,  Melanoma 46%  and  Pancreatic cancer 60% +++++++++++++++++++++++++ Vitamin D goal  is between 70-100.  Please make sure that you are taking your Vitamin D as directed.  It is very important as a natural anti-inflammatory  helping hair, skin, and nails, as well as reducing stroke and heart attack risk.  It helps your bones and helps with mood. It also decreases numerous cancer risks so please take it as directed.  Low Vit D is associated with a 200-300% higher risk for CANCER  and 200-300% higher risk for HEART   ATTACK  &  STROKE.   .....................................Marland Kitchen It is also associated with higher death rate at younger ages,  autoimmune diseases like Rheumatoid arthritis, Lupus, Multiple Sclerosis.    Also many other serious conditions, like depression, Alzheimer's Dementia, infertility, muscle aches, fatigue, fibromyalgia - just to name a few. ++++++++++++++++++++ Recommend the book "The END of DIETING" by Dr Excell Seltzer  & the book "The END of DIABETES " by Dr Excell Seltzer At [email or web address] - get book & Audio CD's    Being diabetic has a  300% increased risk for heart attack, stroke, cancer, and alzheimer- type vascular dementia. It is very important that you work harder with diet by avoiding all foods that are white. Avoid white rice (brown & wild rice is OK), white potatoes (sweetpotatoes in moderation is OK), White bread or wheat bread or anything made out of white flour like bagels, donuts, rolls, buns, biscuits, cakes, pastries, cookies, pizza crust, and pasta (made from white flour & egg whites) - vegetarian pasta or spinach or wheat pasta is OK. Multigrain breads like Arnold's or Pepperidge [employer], or multigrain  sandwich thins or flatbreads.  Diet, exercise and weight loss can reverse and cure diabetes in the early stages.  Diet, exercise and weight loss is very important in the control and prevention of complications of diabetes which affects every system in your body, ie. Brain - dementia/stroke, eyes - glaucoma/blindness, heart - heart attack/heart failure, kidneys - dialysis, stomach - gastric paralysis, intestines - malabsorption, nerves - severe painful neuritis, circulation - gangrene & loss of a leg(s), and finally cancer and Alzheimers.    I recommend avoid fried & greasy foods,  sweets/candy, white rice (brown or wild rice or Quinoa is OK), white potatoes (sweet potatoes are OK) - anything made from white flour - bagels, doughnuts, rolls, buns, biscuits,white and wheat breads, pizza crust and traditional pasta made of white flour & egg white(vegetarian pasta or spinach or wheat pasta is OK).  Multi-grain bread is OK - like multi-grain flat bread or sandwich thins. Avoid alcohol in excess. Exercise is also important.    Eat all the vegetables you want - avoid meat, especially red meat and dairy - especially cheese.  Cheese is the most concentrated form of trans-fats which is the worst thing to clog up our arteries. Veggie cheese is OK which can be found in the fresh produce section at [employer] or [employer] or [employer]  +++++++++++++++++++++ DASH Eating Plan  DASH stands for "Dietary Approaches to Stop Hypertension."   The DASH eating plan is a healthy eating plan that has been shown to reduce high blood pressure (hypertension). Additional health benefits may include  reducing the risk of type 2 diabetes mellitus, heart disease, and stroke. The DASH eating plan may also help with weight loss. WHAT DO I NEED TO KNOW ABOUT THE DASH EATING PLAN? For the DASH eating plan, you will follow these general guidelines:  Choose foods with a percent daily value for sodium of less than 5% (as listed on the  food label).  Use salt-free seasonings or herbs instead of table salt or sea salt.  Check with your health care provider or pharmacist before using salt substitutes.  Eat lower-sodium products, often labeled as "lower sodium" or "no salt added."  Eat fresh foods.  Eat more vegetables, fruits, and low-fat dairy products.  Choose whole grains. Look for the word "whole" as the first word in the ingredient list.  Choose fish   Limit sweets, desserts, sugars, and sugary drinks.  Choose heart-healthy fats.  Eat veggie cheese   Eat more home-cooked food and less restaurant, buffet, and fast food.  Limit fried foods.  Cook foods using methods other than frying.  Limit canned vegetables. If you do use them, rinse them well to decrease the sodium.  When eating at a restaurant, ask that your food be prepared with less salt, or no salt if possible.                      WHAT FOODS CAN I EAT? Read Dr Fara Olden Fuhrman's books on The End of Dieting & The End of Diabetes  Grains Whole grain or whole wheat bread. Brown rice. Whole grain or whole wheat pasta. Quinoa, bulgur, and whole grain cereals. Low-sodium cereals. Corn or whole wheat flour tortillas. Whole grain cornbread. Whole grain crackers. Low-sodium crackers.  Vegetables Fresh or frozen vegetables (raw, steamed, roasted, or grilled). Low-sodium or reduced-sodium tomato and vegetable juices. Low-sodium or reduced-sodium tomato sauce and paste. Low-sodium or reduced-sodium canned vegetables.   Fruits All fresh, canned (in natural juice), or frozen fruits.  Protein Products  All fish and seafood.  Dried beans, peas, or lentils. Unsalted nuts and seeds. Unsalted canned beans.  Dairy Low-fat dairy products, such as skim or 1% milk, 2% or reduced-fat cheeses, low-fat ricotta or cottage cheese, or plain low-fat yogurt. Low-sodium or reduced-sodium cheeses.  Fats and Oils Tub margarines without trans fats. Light or reduced-fat  mayonnaise and salad dressings (reduced sodium). Avocado. Safflower, olive, or canola oils. Natural peanut or almond butter.  Other Unsalted popcorn and pretzels. The items listed above may not be a complete list of recommended foods or beverages. Contact your dietitian for more options.  +++++++++++++++  WHAT FOODS ARE NOT RECOMMENDED? Grains/ White flour or wheat flour White bread. White pasta. White rice. Refined cornbread. Bagels and croissants. Crackers that contain trans fat.  Vegetables  Creamed or fried vegetables. Vegetables in a . Regular canned vegetables. Regular canned tomato sauce and paste. Regular tomato and vegetable juices.  Fruits Dried fruits. Canned fruit in light or heavy syrup. Fruit juice.  Meat and Other Protein Products Meat in general - RED meat & White meat.  Fatty cuts of meat. Ribs, chicken wings, all processed meats as bacon, sausage, bologna, salami, fatback, hot dogs, bratwurst and packaged luncheon meats.  Dairy Whole or 2% milk, cream, half-and-half, and cream cheese. Whole-fat or sweetened yogurt. Full-fat cheeses or blue cheese. Non-dairy creamers and whipped toppings. Processed cheese, cheese spreads, or cheese curds.  Condiments Onion and garlic salt, seasoned salt, table salt, and sea salt. Canned and packaged gravies. Worcestershire sauce.  Tartar sauce. Barbecue sauce. Teriyaki sauce. Soy sauce, including reduced sodium. Steak sauce. Fish sauce. Oyster sauce. Cocktail sauce. Horseradish. Ketchup and mustard. Meat flavorings and tenderizers. Bouillon cubes. Hot sauce. Tabasco sauce. Marinades. Taco seasonings. Relishes.  Fats and Oils Butter, stick margarine, lard, shortening and bacon fat. Coconut, palm kernel, or palm oils. Regular salad dressings.  Pickles and olives. Salted popcorn and pretzels.  The items listed above may not be a complete list of foods and beverages to avoid.

## 2016-01-31 ENCOUNTER — Telehealth: Payer: Self-pay | Admitting: *Deleted

## 2016-01-31 ENCOUNTER — Encounter: Payer: Self-pay | Admitting: Internal Medicine

## 2016-01-31 LAB — TSH: TSH: 2.84 mIU/L

## 2016-01-31 LAB — HEMOGLOBIN A1C
HEMOGLOBIN A1C: 5 % (ref <5.7)
MEAN PLASMA GLUCOSE: 97 mg/dL

## 2016-01-31 LAB — INSULIN, RANDOM: INSULIN: 7.3 u[IU]/mL (ref 2.0–19.6)

## 2016-01-31 LAB — VITAMIN D 25 HYDROXY (VIT D DEFICIENCY, FRACTURES): VIT D 25 HYDROXY: 50 ng/mL (ref 30–100)

## 2016-01-31 NOTE — Telephone Encounter (Signed)
Patient called and states the Mirtazapine is causing dizziness.  Per Dr Melford Aase, it is the best medication to increase the appetite and suggest she take 1/2 tablet a bedtime.  Patient is aware of advise.

## 2016-01-31 NOTE — Progress Notes (Signed)
[facility] Unk Pinto, M.D.        Uvaldo Bristle. Silverio Lay, P.A.-C       Starlyn Skeans, P.A.-C  [facility]                [address] [place] [id number] Telephone [phone] Telefax [phone] ______________________________________________________________________     This very nice 82 y.o. Spry  [name] presents for follow up with Hypertension, Hyperlipidemia, Pre-Diabetes and Vitamin D Deficiency.  Patient was recently hospitalized 9/26-28/2017 for an elective R TKR and has done well since surgery. Patient expressed concern re: her weight having lost 4 # since Aug prior to her knee surg and she reports a  loss of appetite w/o other sx's.       Patient is treated for HTN & BP has been controlled at home. Today's BP is at goal - 124/68. Patient has had no complaints of any cardiac type chest pain, palpitations, dyspnea/orthopnea/PND, dizziness, claudication, or dependent edema.     Hyperlipidemia is controlled with diet & meds. Patient denies myalgias or other med SE's. Last Lipids were at goal:  Lab Results  Component Value Date   CHOL 161 10/22/2015   HDL 65 10/22/2015   LDLCALC 67 10/22/2015   TRIG 143 10/22/2015   CHOLHDL 2.5 10/22/2015      Also, the patient has history of screened proactively for PreDiabetes and has had no symptoms of reactive hypoglycemia, diabetic polys, paresthesias or visual blurring.  Last A1c was at goal: Lab Results  Component Value Date   HGBA1C 5.0 01/30/2016      Further, the patient also has history of Vitamin D Deficiency and supplements vitamin D without any suspected side-effects. Last vitamin D was still low (goal 70-100):  Lab Results  Component Value Date   VD25OH 50 01/30/2016   Current Outpatient Prescriptions on File Prior to Visit  Medication Sig  . Calcium Carbonate-Vitamin D (CALCIUM 600+D HIGH POTENCY) 600-400 MG-UNIT per tablet Take 1  tablet by mouth daily.    . cholecalciferol (VITAMIN D) 1000 UNITS tablet Take 2,000 Units by mouth 2 (two) times daily.   . clotrimazole-betamethasone (LOTRISONE) cream Apply 1 application topically 2 (two) times daily.  . diazepam (VALIUM) 5 MG tablet Take 1 tablet (5 mg total) by mouth every 8 (eight) hours as needed for anxiety.  Marland Kitchen levothyroxine (SYNTHROID) 150 MCG tablet Take 1 tablet by mouth  daily  . Potassium Gluconate 595 MG CAPS Take 595 mg by mouth daily.    . Probiotic Product (PROBIOTIC PO) Take 1 tablet by mouth daily.  . simvastatin (ZOCOR) 20 MG tablet TAKE 1 TABLET BY MOUTH  EVERY NIGHT AT BEDTIME  . vitamin C (ASCORBIC ACID) 500 MG tablet Take 500 mg by mouth daily.    . vitamin E (VITAMIN E) 400 UNIT capsule Take 400 Units by mouth every other day.    No current facility-administered medications on file prior to visit.    Allergies  Allergen Reactions  . Ciprofloxacin   . Epinephrine Other (See Comments)    Heart raced   . Etodolac Hives  . Synephrine     Palpitations   . Flagyl [Metronidazole Hcl] Rash   PMHx:   Past Medical History:  Diagnosis Date  . Anxiety   . Breast cancer ([place])    left  . Colon polyp 2009   BENIGN  POLYPOID  . Diverticulosis of colon (without mention of hemorrhage) 2009  . DJD (degenerative joint disease)   . Family history of malignant neoplasm of gastrointestinal tract 05/06/2011  . HOH (hard of hearing)    Right ear  . Hypercholesteremia   . Hypothyroidism   . IBS (irritable bowel syndrome)   . Internal hemorrhoids without mention of complication   . Sinus drainage   . Vitiligo   . Wears dentures    top  . Wears glasses    Immunization History  Administered Date(s) Administered  . Influenza Split 12/01/2010  . Influenza, High Dose Seasonal PF 11/12/2015  . Influenza-Unspecified 10/31/2012, 11/29/2014  . Pneumococcal Conjugate-13 10/18/2013  . Pneumococcal Polysaccharide-23 03/20/2010  . Td 10/17/2012   Past Surgical  History:  Procedure Laterality Date  . ABDOMINAL HYSTERECTOMY    . bladder tac    . BREAST LUMPECTOMY  2004   left lump-snbx  . BUNIONECTOMY     bilateral  . COLONOSCOPY  2009   several  . CYSTOCELE REPAIR  2009  . EYE SURGERY Bilateral    Cataract removal  . KNEE ARTHROSCOPY Left 01/17/2013   Procedure: ARTHROSCOPY LEFT KNEE;  Surgeon: Hessie Dibble, MD;  Location: [facility];  Service: Orthopedics;  Laterality: Left;  partial medial and partial lateral chondroplasty and removal loose body  . right shoulder  2003   bone spur removed-rcr  . TONSILLECTOMY    . TOTAL KNEE ARTHROPLASTY Right 11/26/2015   Procedure: TOTAL KNEE ARTHROPLASTY;  Surgeon: Melrose Nakayama, MD;  Location: [facility];  Service: Orthopedics;  Laterality: Right;  . WRIST FRACTURE SURGERY  2003   left-lipoma   FHx:    Reviewed / unchanged  SHx:    Reviewed / unchanged  Systems Review:  Constitutional: Denies fever, chills, wt changes, headaches, insomnia, fatigue, night sweats, change in appetite. Eyes: Denies redness, blurred vision, diplopia, discharge, itchy, watery eyes.  ENT: Denies discharge, congestion, post nasal drip, epistaxis, sore throat, earache, hearing loss, dental pain, tinnitus, vertigo, sinus pain, snoring.  CV: Denies chest pain, palpitations, irregular heartbeat, syncope, dyspnea, diaphoresis, orthopnea, PND, claudication or edema. Respiratory: denies cough, dyspnea, DOE, pleurisy, hoarseness, laryngitis, wheezing.  Gastrointestinal: Denies dysphagia, odynophagia, heartburn, reflux, water brash, abdominal pain or cramps, nausea, vomiting, bloating, diarrhea, constipation, hematemesis, melena, hematochezia  or hemorrhoids. Genitourinary: Denies dysuria, frequency, urgency, nocturia, hesitancy, discharge, hematuria or flank pain. Musculoskeletal: Denies arthralgias, myalgias, stiffness, jt. swelling, pain, limping or strain/sprain.  Skin: Denies pruritus, rash, hives, warts, acne,  eczema or change in skin lesion(s). Neuro: No weakness, tremor, incoordination, spasms, paresthesia or pain. Psychiatric: Denies confusion, memory loss or sensory loss. Endo: Denies change in weight, skin or hair change.  Heme/Lymph: No excessive bleeding, bruising or enlarged lymph nodes.  Physical Exam BP 124/68   Pulse 68   Temp 97.3 F (36.3 C)   Resp 16   Ht 5' 4.5" (1.638 m)   Wt 130 lb 6.4 oz (59.1 kg)   BMI 22.04 kg/m   Appears well nourished and in no distress.  Eyes: PERRLA, EOMs, conjunctiva no swelling or erythema. Sinuses: No frontal/maxillary tenderness ENT/Mouth: EAC's clear, TM's nl w/o erythema, bulging. Nares clear w/o erythema, swelling, exudates. Oropharynx clear without erythema or exudates. Oral hygiene is good. Tongue normal, non obstructing. Hearing intact.  Neck: Supple. Thyroid nl. Car 2+/2+ without bruits, nodes or JVD. Chest: Respirations nl with BS clear & equal w/o rales, rhonchi, wheezing or stridor.  Cor: Heart sounds normal w/ regular  rate and rhythm without sig. murmurs, gallops, clicks, or rubs. Peripheral pulses normal and equal  without edema.  Abdomen: Soft & bowel sounds normal. Non-tender w/o guarding, rebound, hernias, masses, or organomegaly.  Lymphatics: Unremarkable.  Musculoskeletal: Full ROM all peripheral extremities, joint stability, 5/5 strength, and normal gait.  Skin: Warm, dry without exposed rashes, lesions or ecchymosis apparent.  Neuro: Cranial nerves intact, reflexes equal bilaterally. Sensory-motor testing grossly intact. Tendon reflexes grossly intact.  Pysch: Alert & oriented x 3.  Insight and judgement nl & appropriate. No ideations.  Assessment and Plan:   1. Loss of weight  - Continue medication, monitor blood pressure at home.  - Continue DASH diet. Reminder to go to the ER if any CP,  SOB, nausea, dizziness, severe HA, changes vision/speech,  left arm numbness and tingling and jaw pain. - TSH  2. Elevated BP  without diagnosis of hypertension  - Continue diet/meds, exercise,& lifestyle modifications.  - Continue monitor periodic cholesterol/liver & renal functions   3. Prediabetes  - Continue diet, exercise, lifestyle modifications.  - Monitor appropriate labs. - Hemoglobin A1c - Insulin, random  4. Vitamin D deficiency  - Continue supplementation. - VITAMIN D 25 Hydroxy   5. Other allergic rhinitis   6. Other abnormal glucose   7. Medication management  - CBC with Differential/Platelet - BASIC METABOLIC PANEL WITH GFR - Hepatic function panel - Magnesium      Recommended regular exercise, BP monitoring, weight control, and discussed med and SE's. Recommended labs to assess and monitor clinical status. Further disposition pending results of labs. Over 30 minutes of exam, counseling, chart review was performed

## 2016-01-31 NOTE — Telephone Encounter (Signed)
error 

## 2016-02-03 ENCOUNTER — Encounter: Payer: Self-pay | Admitting: *Deleted

## 2016-02-03 NOTE — Progress Notes (Signed)
Caren Griffins spoke with patient about lab results and instructions.  Caren Griffins mailed a copy of results to patient per her request.

## 2016-03-04 DIAGNOSIS — M79672 Pain in left foot: Secondary | ICD-10-CM | POA: Diagnosis not present

## 2016-03-04 DIAGNOSIS — M25561 Pain in right knee: Secondary | ICD-10-CM | POA: Diagnosis not present

## 2016-04-08 ENCOUNTER — Other Ambulatory Visit: Payer: Self-pay | Admitting: Physician Assistant

## 2016-04-08 DIAGNOSIS — Z1231 Encounter for screening mammogram for malignant neoplasm of breast: Secondary | ICD-10-CM

## 2016-04-23 ENCOUNTER — Ambulatory Visit (INDEPENDENT_AMBULATORY_CARE_PROVIDER_SITE_OTHER): Payer: Medicare Other | Admitting: Internal Medicine

## 2016-04-23 VITALS — BP 118/66 | HR 58 | Temp 98.0°F | Wt 134.8 lb

## 2016-04-23 DIAGNOSIS — R6889 Other general symptoms and signs: Secondary | ICD-10-CM | POA: Diagnosis not present

## 2016-04-23 DIAGNOSIS — R0989 Other specified symptoms and signs involving the circulatory and respiratory systems: Secondary | ICD-10-CM

## 2016-04-23 DIAGNOSIS — F411 Generalized anxiety disorder: Secondary | ICD-10-CM

## 2016-04-23 DIAGNOSIS — J309 Allergic rhinitis, unspecified: Secondary | ICD-10-CM | POA: Diagnosis not present

## 2016-04-23 DIAGNOSIS — K635 Polyp of colon: Secondary | ICD-10-CM | POA: Diagnosis not present

## 2016-04-23 DIAGNOSIS — E039 Hypothyroidism, unspecified: Secondary | ICD-10-CM | POA: Diagnosis not present

## 2016-04-23 DIAGNOSIS — Z0000 Encounter for general adult medical examination without abnormal findings: Secondary | ICD-10-CM

## 2016-04-23 DIAGNOSIS — M1990 Unspecified osteoarthritis, unspecified site: Secondary | ICD-10-CM

## 2016-04-23 DIAGNOSIS — Z79899 Other long term (current) drug therapy: Secondary | ICD-10-CM

## 2016-04-23 DIAGNOSIS — M1711 Unilateral primary osteoarthritis, right knee: Secondary | ICD-10-CM | POA: Diagnosis not present

## 2016-04-23 DIAGNOSIS — R300 Dysuria: Secondary | ICD-10-CM

## 2016-04-23 DIAGNOSIS — R7303 Prediabetes: Secondary | ICD-10-CM | POA: Diagnosis not present

## 2016-04-23 DIAGNOSIS — K648 Other hemorrhoids: Secondary | ICD-10-CM

## 2016-04-23 DIAGNOSIS — I10 Essential (primary) hypertension: Secondary | ICD-10-CM | POA: Diagnosis not present

## 2016-04-23 DIAGNOSIS — K5790 Diverticulosis of intestine, part unspecified, without perforation or abscess without bleeding: Secondary | ICD-10-CM

## 2016-04-23 DIAGNOSIS — E782 Mixed hyperlipidemia: Secondary | ICD-10-CM | POA: Diagnosis not present

## 2016-04-23 DIAGNOSIS — G4700 Insomnia, unspecified: Secondary | ICD-10-CM

## 2016-04-23 DIAGNOSIS — Z0001 Encounter for general adult medical examination with abnormal findings: Secondary | ICD-10-CM | POA: Diagnosis not present

## 2016-04-23 DIAGNOSIS — E559 Vitamin D deficiency, unspecified: Secondary | ICD-10-CM

## 2016-04-23 DIAGNOSIS — Z853 Personal history of malignant neoplasm of breast: Secondary | ICD-10-CM

## 2016-04-23 LAB — BASIC METABOLIC PANEL WITH GFR
BUN: 20 mg/dL (ref 7–25)
CALCIUM: 9.7 mg/dL (ref 8.6–10.4)
CO2: 26 mmol/L (ref 20–31)
CREATININE: 0.85 mg/dL (ref 0.60–0.88)
Chloride: 107 mmol/L (ref 98–110)
GFR, Est African American: 71 mL/min (ref >=60)
GFR, Est Non African American: 61 mL/min (ref >=60)
GLUCOSE: 80 mg/dL (ref 65–99)
Potassium: 4.3 mmol/L (ref 3.5–5.3)
Sodium: 143 mmol/L (ref 135–146)

## 2016-04-23 LAB — LIPID PANEL
CHOLESTEROL: 175 mg/dL (ref <200)
HDL: 70 mg/dL (ref >50)
LDL CALC: 87 mg/dL (ref <100)
TRIGLYCERIDES: 90 mg/dL (ref <150)
Total CHOL/HDL Ratio: 2.5 Ratio (ref <5.0)
VLDL: 18 mg/dL (ref <30)

## 2016-04-23 LAB — CBC WITH DIFFERENTIAL/PLATELET
BASOS ABS: 52 {cells}/uL (ref 0–200)
Basophils Relative: 1 %
Eosinophils Absolute: 104 cells/uL (ref 15–500)
Eosinophils Relative: 2 %
HEMATOCRIT: 38.9 % (ref 35.0–45.0)
HEMOGLOBIN: 12.8 g/dL (ref 11.7–15.5)
LYMPHS ABS: 1560 {cells}/uL (ref 850–3900)
LYMPHS PCT: 30 %
MCH: 28.5 pg (ref 27.0–33.0)
MCHC: 32.9 g/dL (ref 32.0–36.0)
MCV: 86.6 fL (ref 80.0–100.0)
MONO ABS: 520 {cells}/uL (ref 200–950)
MPV: 9.4 fL (ref 7.5–12.5)
Monocytes Relative: 10 %
NEUTROS PCT: 57 %
Neutro Abs: 2964 cells/uL (ref 1500–7800)
Platelets: 234 10*3/uL (ref 140–400)
RBC: 4.49 MIL/uL (ref 3.80–5.10)
RDW: 14.1 % (ref 11.0–15.0)
WBC: 5.2 10*3/uL (ref 3.8–10.8)

## 2016-04-23 LAB — HEPATIC FUNCTION PANEL
ALBUMIN: 4.2 g/dL (ref 3.6–5.1)
ALT: 12 U/L (ref 6–29)
AST: 17 U/L (ref 10–35)
Alkaline Phosphatase: 62 U/L (ref 33–130)
BILIRUBIN INDIRECT: 0.7 mg/dL (ref 0.2–1.2)
Bilirubin, Direct: 0.2 mg/dL (ref <=0.2)
TOTAL PROTEIN: 6.6 g/dL (ref 6.1–8.1)
Total Bilirubin: 0.9 mg/dL (ref 0.2–1.2)

## 2016-04-23 LAB — TSH: TSH: 1.44 m[IU]/L

## 2016-04-23 MED ORDER — FLUTICASONE PROPIONATE 50 MCG/ACT NA SUSP: 2.0000 | Freq: Every day | NASAL | 0 refills | Status: DC

## 2016-04-23 NOTE — Progress Notes (Signed)
MEDICARE ANNUAL WELLNESS VISIT AND FOLLOW UP Assessment:   1. Internal hemorrhoids -well controlled with anusol  2. Chronic allergic rhinitis, unspecified seasonality, unspecified trigger -cont daily antihistamine -nasal saline as often as tolerated - fluticasone (FLONASE) 50 MCG/ACT nasal spray; Place 2 sprays into both nostrils daily.  Dispense: 16 g; Refill: 0  3. Polyp of colon, unspecified part of colon, unspecified type -monitored by GI  4. Diverticulosis of intestine without bleeding, unspecified intestinal tract location -currently asymptomatic -monitored by GI  5. Hypothyroidism, unspecified type -TSH -cont levothyroxine -dose adjust as necessary  6. Osteoarthritis, unspecified osteoarthritis type, unspecified site -cont OTC analgesics prn  7. Primary osteoarthritis of right knee -followed by Ortho -status post total knee  8. Labile hypertension -well controlled -cont meds -dash diet -exercise as tolerated - TSH  9. Generalized anxiety disorder -cont meds -well controlled on current regime  10. Insomnia, unspecified type -sleep hygiene -does well with remeron and valium prn  11. Medication management  - CBC with Differential/Platelet - BASIC METABOLIC PANEL WITH GFR - Hepatic function panel  12. Mixed hyperlipidemia -cont zocor - Lipid panel  13. Personal history of malignant neoplasm of breast -gets regular mammograms  14. Vitamin D deficiency -cont Vit D supplement  15. Dysuria -patient suspects UTI -likely dehydration - Urinalysis, Routine w reflex microscopic - Urine culture  16. Prediabetes  - Hemoglobin A1c    Over 30 minutes of exam, counseling, chart review, and critical decision making was performed  Future Appointments Date Time Provider [place]  05/19/2016 12:10 PM [facility] 3 [facility]  10/28/2016 2:00 PM Tranice Laduke Forcucci, PA-C [id number] None     Plan:   During the course of the visit the  patient was educated and counseled about appropriate screening and preventive services including:    Pneumococcal vaccine   Influenza vaccine  Prevnar 13  Td vaccine  Screening electrocardiogram  Colorectal cancer screening  Diabetes screening  Glaucoma screening  Nutrition counseling    Subjective:  Alexandria Davies is a 83 y.o. female who presents for Medicare Annual Wellness Visit and 3 month follow up for HTN, hyperlipidemia, prediabetes, and vitamin D Def.   Her blood pressure has been controlled at home, today their BP is BP: 118/66 She does not workout. She denies chest pain, shortness of breath, dizziness.  She is on cholesterol medication and denies myalgias. Her cholesterol is at goal. The cholesterol last visit was:   Lab Results  Component Value Date   CHOL 175 04/23/2016   HDL 70 04/23/2016   LDLCALC 87 04/23/2016   TRIG 90 04/23/2016   CHOLHDL 2.5 04/23/2016   She has a history of well controlled A1c levels and has been out of the prediabetic range for quite some time.   Lab Results  Component Value Date   HGBA1C 5.1 04/23/2016   Last GFR Lab Results  Component Value Date   GFRNONAA 61 04/23/2016     Lab Results  Component Value Date   GFRAA 71 04/23/2016   Patient is on Vitamin D supplement.   Lab Results  Component Value Date   VD25OH 50 01/30/2016      She reports that she has been having some issues with some nasal congestion and some right ear pain.  She reports that this has been going on in the last couple months.    She would like her cholesterol checked.  She reports that she has been having foul smelling urine and low back pain  off an on.  She reports that she does drink 4-6 glasses of water per day.    She is on her thyroid medication.  She takes it daily.    Medication Review: Current Outpatient Prescriptions on File Prior to Visit  Medication Sig Dispense Refill  . Calcium Carbonate-Vitamin D (CALCIUM 600+D HIGH POTENCY)  600-400 MG-UNIT per tablet Take 1 tablet by mouth daily.      . cholecalciferol (VITAMIN D) 1000 UNITS tablet Take 2,000 Units by mouth 2 (two) times daily.     . clotrimazole-betamethasone (LOTRISONE) cream Apply 1 application topically 2 (two) times daily. 45 g 2  . diazepam (VALIUM) 5 MG tablet Take 1 tablet (5 mg total) by mouth every 8 (eight) hours as needed for anxiety. 60 tablet 0  . levothyroxine (SYNTHROID) 150 MCG tablet Take 1 tablet by mouth  daily 90 tablet 3  . mirtazapine (REMERON) 30 MG tablet Take 1 tablet 1 hour before sleep for Mood & Appetite 90 tablet 1  . Potassium Gluconate 595 MG CAPS Take 595 mg by mouth daily.      . Probiotic Product (PROBIOTIC PO) Take 1 tablet by mouth daily.    . simvastatin (ZOCOR) 20 MG tablet TAKE 1 TABLET BY MOUTH  EVERY NIGHT AT BEDTIME 90 tablet 1  . vitamin C (ASCORBIC ACID) 500 MG tablet Take 500 mg by mouth daily.      . vitamin E (VITAMIN E) 400 UNIT capsule Take 400 Units by mouth every other day.      No current facility-administered medications on file prior to visit.     Allergies: Allergies  Allergen Reactions  . Ciprofloxacin   . Epinephrine Other (See Comments)    Heart raced   . Etodolac Hives  . Synephrine     Palpitations   . Flagyl [Metronidazole Hcl] Rash    Current Problems (verified) has Hypothyroidism; Internal hemorrhoids; Osteoarthritis; PERSONAL HISTORY OF MALIGNANT NEOPLASM OF BREAST; Diverticulosis; Colon polyps; Rhinitis, allergic; Labile hypertension; Mixed hyperlipidemia; Vitamin D deficiency; Medication management; Insomnia; Generalized anxiety disorder; and Primary osteoarthritis of right knee on her problem list.  Screening Tests Immunization History  Administered Date(s) Administered  . Influenza Split 12/01/2010  . Influenza, High Dose Seasonal PF 11/12/2015  . Influenza-Unspecified 10/31/2012, 11/29/2014  . Pneumococcal Conjugate-13 10/18/2013  . Pneumococcal Polysaccharide-23 03/20/2010  .  Td 10/17/2012    Preventative care: Last colonoscopy: 2009 Mammogram:  05/19/16 Bone Density:  2011  Names of Other Physician/Practitioners you currently use: 1. [facility] here for primary care 2. Dr. , eye doctor, last visit  3. Dr. , dentist, last visit   Patient Care Team: Unk Pinto, MD as PCP - General (Internal Medicine) Melrose Nakayama, MD as Consulting Physician (Orthopedic Surgery) Deneise Lever, MD as Consulting Physician (Pulmonary Disease) Inda Castle, MD as Consulting Physician (Gastroenterology) Carolan Clines, MD as Consulting Physician (Urology)  Surgical: She  has a past surgical history that includes Abdominal hysterectomy; bladder tac; Bunionectomy; right shoulder (2003); Wrist fracture surgery (2003); Cystocele repair (2009); Tonsillectomy; Knee arthroscopy (Left, 01/17/2013); Colonoscopy (2009); Breast lumpectomy (2004); Eye surgery (Bilateral); and Total knee arthroplasty (Right, 11/26/2015). Family Her family history includes Breast cancer in her sister; Cancer (age of onset: 74) in her sister; Cancer (age of onset: 96) in her sister; Colon cancer in her son; Heart disease in her mother. Social history  She reports that she has never smoked. She has never used smokeless tobacco. She reports that she  drinks alcohol. She reports that she does not use drugs.  MEDICARE WELLNESS OBJECTIVES: Physical activity:   Cardiac risk factors:   Depression/mood screen:   Depression screen [facility] 2/9 01/31/2016  Decreased Interest 0  Down, Depressed, Hopeless 0  PHQ - 2 Score 0    ADLs:  In your present state of health, do you have any difficulty performing the following activities: 01/31/2016 11/15/2015  Hearing? N N  Vision? N N  Difficulty concentrating or making decisions? N N  Walking or climbing stairs? N Y  Dressing or bathing? N N  Doing errands, shopping? N N  Some recent data might be hidden     Cognitive  Testing  Alert? Yes  Normal Appearance?Yes  Oriented to person? Yes  Place? Yes   Time? Yes  Recall of three objects?  Yes  Can perform simple calculations? Yes  Displays appropriate judgment?Yes  Can read the correct time from a watch face?Yes  EOL planning:     Objective:   Today's Vitals   04/23/16 1109  BP: 118/66  Pulse: (!) 58  Temp: 98 F (36.7 C)  SpO2: 98%  Weight: 134 lb 12.8 oz (61.1 kg)   Body mass index is 22.78 kg/m.  General appearance: alert, no distress, WD/WN, female HEENT: normocephalic, sclerae anicteric, TMs pearly, nares patent, no discharge or erythema, pharynx normal Oral cavity: MMM, no lesions Neck: supple, no lymphadenopathy, no thyromegaly, no masses Heart: RRR, normal S1, S2, no murmurs Lungs: CTA bilaterally, no wheezes, rhonchi, or rales Abdomen: +bs, soft, non tender, non distended, no masses, no hepatomegaly, no splenomegaly Musculoskeletal: nontender, no swelling, no obvious deformity Extremities: no edema, no cyanosis, no clubbing Pulses: 2+ symmetric, upper and lower extremities, normal cap refill Neurological: alert, oriented x 3, CN2-12 intact, strength normal upper extremities and lower extremities, sensation normal throughout, DTRs 2+ throughout, no cerebellar signs, gait normal Psychiatric: normal affect, behavior normal, pleasant   Medicare Attestation I have personally reviewed: The patient's medical and social history Their use of alcohol, tobacco or illicit drugs Their current medications and supplements The patient's functional ability including ADLs,fall risks, home safety risks, cognitive, and hearing and visual impairment Diet and physical activities Evidence for depression or mood disorders  The patient's weight, height, BMI, and visual acuity have been recorded in the chart.  I have made referrals, counseling, and provided education to the patient based on review of the above and I have provided the patient with a  written personalized care plan for preventive services.     Starlyn Skeans, PA-C   04/25/2016

## 2016-04-24 LAB — URINALYSIS, ROUTINE W REFLEX MICROSCOPIC
BILIRUBIN URINE: NEGATIVE
Glucose, UA: NEGATIVE
Hgb urine dipstick: NEGATIVE
Ketones, ur: NEGATIVE
Leukocytes, UA: NEGATIVE
NITRITE: NEGATIVE
PROTEIN: NEGATIVE
Specific Gravity, Urine: 1.008 (ref 1.001–1.035)
pH: 5.5 (ref 5.0–8.0)

## 2016-04-24 LAB — HEMOGLOBIN A1C
Hgb A1c MFr Bld: 5.1 % (ref <5.7)
Mean Plasma Glucose: 100 mg/dL

## 2016-04-25 LAB — URINE CULTURE: Organism ID, Bacteria: NO GROWTH

## 2016-04-27 ENCOUNTER — Encounter: Payer: Self-pay | Admitting: *Deleted

## 2016-04-27 ENCOUNTER — Other Ambulatory Visit: Payer: Self-pay | Admitting: Internal Medicine

## 2016-04-27 DIAGNOSIS — E2839 Other primary ovarian failure: Secondary | ICD-10-CM

## 2016-05-05 ENCOUNTER — Other Ambulatory Visit: Payer: Self-pay | Admitting: *Deleted

## 2016-05-05 MED ORDER — SIMVASTATIN 20 MG PO TABS: 20.0000 mg | ORAL_TABLET | Freq: Every day | ORAL | 1 refills | Status: DC

## 2016-05-19 ENCOUNTER — Ambulatory Visit
Admission: RE | Admit: 2016-05-19 | Discharge: 2016-05-19 | Disposition: A | Discharge status: Home / Self Care | Payer: Medicare Other | Source: Ambulatory Visit | Attending: Internal Medicine | Admitting: Internal Medicine

## 2016-05-19 ENCOUNTER — Ambulatory Visit
Admission: RE | Admit: 2016-05-19 | Discharge: 2016-05-19 | Disposition: A | Discharge status: Home / Self Care | Payer: Medicare Other | Source: Ambulatory Visit | Attending: Physician Assistant | Admitting: Physician Assistant

## 2016-05-19 DIAGNOSIS — Z780 Asymptomatic menopausal state: Secondary | ICD-10-CM | POA: Diagnosis not present

## 2016-05-19 DIAGNOSIS — M85831 Other specified disorders of bone density and structure, right forearm: Secondary | ICD-10-CM | POA: Diagnosis not present

## 2016-05-19 DIAGNOSIS — Z1231 Encounter for screening mammogram for malignant neoplasm of breast: Secondary | ICD-10-CM | POA: Diagnosis not present

## 2016-05-19 DIAGNOSIS — E2839 Other primary ovarian failure: Secondary | ICD-10-CM

## 2016-06-10 DIAGNOSIS — M79672 Pain in left foot: Secondary | ICD-10-CM | POA: Diagnosis not present

## 2016-07-21 ENCOUNTER — Other Ambulatory Visit: Payer: Self-pay | Admitting: *Deleted

## 2016-07-21 MED ORDER — LEVOTHYROXINE SODIUM 150 MCG PO TABS: ORAL_TABLET | ORAL | 0 refills | Status: DC

## 2016-08-11 DIAGNOSIS — M19042 Primary osteoarthritis, left hand: Secondary | ICD-10-CM | POA: Diagnosis not present

## 2016-09-03 DIAGNOSIS — M65342 Trigger finger, left ring finger: Secondary | ICD-10-CM | POA: Diagnosis not present

## 2016-09-18 DIAGNOSIS — J342 Deviated nasal septum: Secondary | ICD-10-CM | POA: Diagnosis not present

## 2016-09-18 DIAGNOSIS — R490 Dysphonia: Secondary | ICD-10-CM | POA: Diagnosis not present

## 2016-09-18 DIAGNOSIS — J040 Acute laryngitis: Secondary | ICD-10-CM | POA: Diagnosis not present

## 2016-09-18 DIAGNOSIS — L040 Acute lymphadenitis of face, head and neck: Secondary | ICD-10-CM | POA: Diagnosis not present

## 2016-10-26 ENCOUNTER — Other Ambulatory Visit: Payer: Self-pay | Admitting: Physician Assistant

## 2016-10-26 MED ORDER — DIAZEPAM 5 MG PO TABS: 5.0000 mg | ORAL_TABLET | Freq: Three times a day (TID) | ORAL | 0 refills | Status: DC | PRN

## 2016-10-26 NOTE — Progress Notes (Signed)
Valium was called into pharmacy on Aug 27th 2018 at 1:54pm by DD

## 2016-10-27 ENCOUNTER — Encounter: Payer: Self-pay | Admitting: Internal Medicine

## 2016-10-28 ENCOUNTER — Encounter: Payer: Self-pay | Admitting: Internal Medicine

## 2016-11-09 ENCOUNTER — Other Ambulatory Visit: Payer: Self-pay | Admitting: *Deleted

## 2016-11-09 MED ORDER — SIMVASTATIN 20 MG PO TABS: 20.0000 mg | ORAL_TABLET | Freq: Every day | ORAL | 1 refills | Status: DC

## 2016-11-10 ENCOUNTER — Ambulatory Visit (INDEPENDENT_AMBULATORY_CARE_PROVIDER_SITE_OTHER): Payer: Medicare Other | Admitting: Internal Medicine

## 2016-11-10 VITALS — BP 112/64 | HR 56 | Temp 97.5°F | Resp 16 | Ht 64.5 in | Wt 142.4 lb

## 2016-11-10 DIAGNOSIS — E039 Hypothyroidism, unspecified: Secondary | ICD-10-CM | POA: Diagnosis not present

## 2016-11-10 DIAGNOSIS — R0989 Other specified symptoms and signs involving the circulatory and respiratory systems: Secondary | ICD-10-CM | POA: Diagnosis not present

## 2016-11-10 DIAGNOSIS — Z79899 Other long term (current) drug therapy: Secondary | ICD-10-CM | POA: Diagnosis not present

## 2016-11-10 DIAGNOSIS — E782 Mixed hyperlipidemia: Secondary | ICD-10-CM

## 2016-11-10 DIAGNOSIS — Z1211 Encounter for screening for malignant neoplasm of colon: Secondary | ICD-10-CM

## 2016-11-10 DIAGNOSIS — E559 Vitamin D deficiency, unspecified: Secondary | ICD-10-CM

## 2016-11-10 DIAGNOSIS — Z136 Encounter for screening for cardiovascular disorders: Secondary | ICD-10-CM

## 2016-11-10 DIAGNOSIS — R7303 Prediabetes: Secondary | ICD-10-CM | POA: Diagnosis not present

## 2016-11-10 DIAGNOSIS — R7309 Other abnormal glucose: Secondary | ICD-10-CM | POA: Diagnosis not present

## 2016-11-10 DIAGNOSIS — Z1212 Encounter for screening for malignant neoplasm of rectum: Secondary | ICD-10-CM

## 2016-11-10 NOTE — Progress Notes (Signed)
[facility] Unk Pinto, M.D.      Uvaldo Bristle. Silverio Lay, P.A.-C [facility]                [address] [place] [address] Telephone [phone] Telefax [phone]  Comprehensive Evaluation &  Examination     This very nice 83 y.o. [name] presents for a  comprehensive evaluation and management of multiple medical co-morbidities.  Patient has been followed for labile HTN, Prediabetes, Hyperlipidemia and Vitamin D Deficiency.      Patient has  Been followed expectantly for Labile HTN. Patient's BP has been controlled at home and patient denies any cardiac symptoms as chest pain, palpitations, shortness of breath, dizziness or ankle swelling. Today's BP is at goal - 112/64.      Patient's hyperlipidemia is controlled with diet and medications. Patient denies myalgias or other medication SE's. Last lipids were at goal: Lab Results  Component Value Date   CHOL 175 04/23/2016   HDL 70 04/23/2016   LDLCALC 87 04/23/2016   TRIG 90 04/23/2016   CHOLHDL 2.5 04/23/2016      Patient has prediabetes and patient denies reactive hypoglycemic symptoms, visual blurring, diabetic polys, or paresthesias. Last A1c was at goal: Lab Results  Component Value Date   HGBA1C 5.1 04/23/2016      Patient has been on Thyroid replacement ~2000. Finally, patient has history of Vitamin D Deficiency ("37"  / 2011)  and last Vitamin D was better: Lab Results  Component Value Date   VD25OH 77 01/30/2016   Current Outpatient Prescriptions on File Prior to Visit  Medication Sig  . Calcium Carbonate-Vitamin D (CALCIUM 600+D HIGH POTENCY) 600-400 MG-UNIT per tablet Take 1 tablet by mouth daily.    . cholecalciferol (VITAMIN D) 1000 UNITS tablet Take 2,000 Units by mouth 2 (two) times daily.   . clotrimazole-betamethasone (LOTRISONE) cream Apply 1 application topically 2 (two) times daily.  . diazepam (VALIUM) 5 MG tablet Take  1 tablet (5 mg total) by mouth every 8 (eight) hours as needed for anxiety.  Marland Kitchen levothyroxine (SYNTHROID) 150 MCG tablet Take 1 tablet by mouth  daily  . Potassium Gluconate 595 MG CAPS Take 595 mg by mouth daily.    . Probiotic Product (PROBIOTIC PO) Take 1 tablet by mouth daily.  . simvastatin (ZOCOR) 20 MG tablet Take 1 tablet (20 mg total) by mouth at bedtime.  . vitamin C (ASCORBIC ACID) 500 MG tablet Take 500 mg by mouth daily.     No current facility-administered medications on file prior to visit.    Allergies  Allergen Reactions  . Ciprofloxacin   . Epinephrine Other (See Comments)    Heart raced   . Etodolac Hives  . Synephrine     Palpitations   . Flagyl [Metronidazole Hcl] Rash   Past Medical History:  Diagnosis Date  . Anxiety   . Breast cancer ([place])    left  . Colon polyp 2009   BENIGN POLYPOID  . Diverticulosis of colon (without mention of hemorrhage) 2009  . DJD (degenerative joint disease)   . Family history of malignant neoplasm of gastrointestinal tract 05/06/2011  . HOH (hard of hearing)    Right ear  . Hypercholesteremia   . Hypothyroidism   . IBS (irritable bowel syndrome)   . Internal hemorrhoids without mention of complication   . Sinus drainage   .  Vitiligo   . Wears dentures    top  . Wears glasses    Health Maintenance  Topic Date Due  . INFLUENZA VACCINE  09/30/2016  . TETANUS/TDAP  10/18/2022  . DEXA SCAN  Completed  . PNA vac Low Risk Adult  Completed   Immunization History  Administered Date(s) Administered  . Influenza Split 12/01/2010  . Influenza, High Dose Seasonal PF 11/12/2015  . Influenza-Unspecified 10/31/2012, 11/29/2014  . Pneumococcal Conjugate-13 10/18/2013  . Pneumococcal Polysaccharide-23 03/20/2010  . Td 10/17/2012   Past Surgical History:  Procedure Laterality Date  . ABDOMINAL HYSTERECTOMY    . bladder tac    . BREAST LUMPECTOMY  2004   left lump-snbx  . BUNIONECTOMY     bilateral  . COLONOSCOPY  2009    several  . CYSTOCELE REPAIR  2009  . EYE SURGERY Bilateral    Cataract removal  . KNEE ARTHROSCOPY Left 01/17/2013   Procedure: ARTHROSCOPY LEFT KNEE;  Surgeon: Hessie Dibble, MD;  Location: [facility];  Service: Orthopedics;  Laterality: Left;  partial medial and partial lateral chondroplasty and removal loose body  . right shoulder  2003   bone spur removed-rcr  . TONSILLECTOMY    . TOTAL KNEE ARTHROPLASTY Right 11/26/2015   Procedure: TOTAL KNEE ARTHROPLASTY;  Surgeon: Melrose Nakayama, MD;  Location: [facility];  Service: Orthopedics;  Laterality: Right;  . WRIST FRACTURE SURGERY  2003   left-lipoma   Family History  Problem Relation Age of Onset  . Breast cancer Sister        x 2  . Cancer Sister 26       breast  . Cancer Sister 76       breast cancer  . Colon cancer Son   . Heart disease Mother        died at age 78   Social History  Substance Use Topics  . Smoking status: Never Smoker  . Smokeless tobacco: Never Used  . Alcohol use Yes     Comment: ocassionally    ROS Constitutional: Denies fever, chills, weight loss/gain, headaches, insomnia,  night sweats, and change in appetite. Does c/o fatigue. Eyes: Denies redness, blurred vision, diplopia, discharge, itchy, watery eyes.  ENT: Denies discharge, congestion, post nasal drip, epistaxis, sore throat, earache, hearing loss, dental pain, Tinnitus, Vertigo, Sinus pain, snoring.  Cardio: Denies chest pain, palpitations, irregular heartbeat, syncope, dyspnea, diaphoresis, orthopnea, PND, claudication, edema Respiratory: denies cough, dyspnea, DOE, pleurisy, hoarseness, laryngitis, wheezing.  Gastrointestinal: Denies dysphagia, heartburn, reflux, water brash, pain, cramps, nausea, vomiting, bloating, diarrhea, constipation, hematemesis, melena, hematochezia, jaundice, hemorrhoids Genitourinary: Denies dysuria, frequency, urgency, nocturia, hesitancy, discharge, hematuria, flank pain Breast: Breast lumps, nipple  discharge, bleeding.  Musculoskeletal: Denies arthralgia, myalgia, stiffness, Jt. Swelling, pain, limp, and strain/sprain. Denies falls. Skin: Denies puritis, rash, hives, warts, acne, eczema, changing in skin lesion Neuro: No weakness, tremor, incoordination, spasms, paresthesia, pain Psychiatric: Denies confusion, memory loss, sensory loss. Denies Depression. Endocrine: Denies change in weight, skin, hair change, nocturia, and paresthesia, diabetic polys, visual blurring, hyper / hypo glycemic episodes.  Heme/Lymph: No excessive bleeding, bruising, enlarged lymph nodes.  Physical Exam  BP 112/64   Pulse (!) 56   Temp (!) 97.5 F (36.4 C)   Resp 16   Ht 5' 4.5" (1.638 m)   Wt 142 lb 6.4 oz (64.6 kg)   BMI 24.07 kg/m   General Appearance: Well nourished, well groomed and in no apparent distress.  Eyes: PERRLA, EOMs, conjunctiva no  swelling or erythema, normal fundi and vessels. Sinuses: No frontal/maxillary tenderness ENT/Mouth: EACs patent / TMs  nl. Nares clear without erythema, swelling, mucoid exudates. Oral hygiene is good. No erythema, swelling, or exudate. Tongue normal, non-obstructing. Tonsils not swollen or erythematous. Hearing normal.  Neck: Supple, thyroid normal. No bruits, nodes or JVD. Respiratory: Respiratory effort normal.  BS equal and clear bilateral without rales, rhonci, wheezing or stridor. Cardio: Heart sounds are normal with regular rate and rhythm and no murmurs, rubs or gallops. Peripheral pulses are normal and equal bilaterally without edema. No aortic or femoral bruits. Chest: symmetric with normal excursions and percussion. Breasts: Symmetric, without lumps, nipple discharge, retractions, or fibrocystic changes.  Abdomen: Flat, soft with bowel sounds active. Nontender, no guarding, rebound, hernias, masses, or organomegaly.  Lymphatics: Non tender without lymphadenopathy.  Musculoskeletal: Full ROM all peripheral extremities, joint stability, 5/5  strength, and normal gait. Skin: Warm and dry without rashes, lesions, cyanosis, clubbing or  ecchymosis.  Neuro: Cranial nerves intact, reflexes equal bilaterally. Normal muscle tone, no cerebellar symptoms. Sensation intact.  Pysch: Alert and oriented X 3, normal affect, Insight and Judgment appropriate.   Assessment and Plan 1. Labile hypertension  - EKG 12-Lead - Urinalysis, Routine w reflex microscopic - Microalbumin / creatinine urine ratio - CBC with Differential/Platelet - BASIC METABOLIC PANEL WITH GFR - Magnesium - TSH  2. Hyperlipidemia, mixed  - EKG 12-Lead - Hepatic function panel - Lipid panel  3. Prediabetes  - EKG 12-Lead - Hemoglobin A1c - Insulin, fasting  4. Vitamin D deficiency  - VITAMIN D 25 Hydroxy   5. Hypothyroidism  - TSH  6. Other abnormal glucose  - Hemoglobin A1c - Insulin, fasting  7. Screening for colorectal cancer  - POC Hemoccult Bld/Stl  8. Screening for ischemic heart disease  - EKG 12-Lead  9. Medication management  - Urinalysis, Routine w reflex microscopic - Microalbumin / creatinine urine ratio - CBC with Differential/Platelet - BASIC METABOLIC PANEL WITH GFR - Hepatic function panel - Magnesium - Lipid panel - TSH - Hemoglobin A1c - Insulin, fasting - VITAMIN D 25 Hydroxy          Patient was counseled in prudent diet to achieve/maintain BMI less than 25 for weight control, BP monitoring, regular exercise and medications. Discussed med's effects and SE's. Screening labs and tests as requested with regular follow-up as recommended. Over 40 minutes of exam, counseling, chart review and high complex critical decision making was performed.

## 2016-11-10 NOTE — Patient Instructions (Signed)

## 2016-11-11 LAB — MAGNESIUM: Magnesium: 2 mg/dL (ref 1.5–2.5)

## 2016-11-11 LAB — URINE CULTURE
MICRO NUMBER:: 80999932
SPECIMEN QUALITY:: ADEQUATE

## 2016-11-11 LAB — MICROALBUMIN / CREATININE URINE RATIO: Creatinine, Urine: 26 mg/dL (ref 20–275)

## 2016-11-11 LAB — INSULIN, FASTING: INSULIN: 4.6 u[IU]/mL (ref 2.0–19.6)

## 2016-11-11 LAB — URINALYSIS, ROUTINE W REFLEX MICROSCOPIC
Bilirubin Urine: NEGATIVE
Glucose, UA: NEGATIVE
HGB URINE DIPSTICK: NEGATIVE
Ketones, ur: NEGATIVE
LEUKOCYTES UA: NEGATIVE
NITRITE: NEGATIVE
PH: 6.5 (ref 5.0–8.0)
Protein, ur: NEGATIVE
Specific Gravity, Urine: 1.009 (ref 1.001–1.03)

## 2016-11-11 LAB — HEPATIC FUNCTION PANEL
AG Ratio: 2.2 (calc) (ref 1.0–2.5)
ALT: 12 U/L (ref 6–29)
AST: 17 U/L (ref 10–35)
Albumin: 4.3 g/dL (ref 3.6–5.1)
Alkaline phosphatase (APISO): 64 U/L (ref 33–130)
BILIRUBIN INDIRECT: 0.8 mg/dL (ref 0.2–1.2)
Bilirubin, Direct: 0.2 mg/dL (ref 0.0–0.2)
GLOBULIN: 2 g/dL (ref 1.9–3.7)
TOTAL PROTEIN: 6.3 g/dL (ref 6.1–8.1)
Total Bilirubin: 1 mg/dL (ref 0.2–1.2)

## 2016-11-11 LAB — CBC WITH DIFFERENTIAL/PLATELET
BASOS PCT: 0.5 %
Basophils Absolute: 31 cells/uL (ref 0–200)
EOS PCT: 1.8 %
Eosinophils Absolute: 110 cells/uL (ref 15–500)
HCT: 39.2 % (ref 35.0–45.0)
Hemoglobin: 13.2 g/dL (ref 11.7–15.5)
Lymphs Abs: 1409 cells/uL (ref 850–3900)
MCH: 29.5 pg (ref 27.0–33.0)
MCHC: 33.7 g/dL (ref 32.0–36.0)
MCV: 87.5 fL (ref 80.0–100.0)
MONOS PCT: 8.2 %
MPV: 10.5 fL (ref 7.5–12.5)
NEUTROS ABS: 4050 {cells}/uL (ref 1500–7800)
Neutrophils Relative %: 66.4 %
PLATELETS: 240 10*3/uL (ref 140–400)
RBC: 4.48 10*6/uL (ref 3.80–5.10)
RDW: 12.7 % (ref 11.0–15.0)
TOTAL LYMPHOCYTE: 23.1 %
WBC mixed population: 500 cells/uL (ref 200–950)
WBC: 6.1 10*3/uL (ref 3.8–10.8)

## 2016-11-11 LAB — LIPID PANEL
CHOLESTEROL: 155 mg/dL (ref <200)
HDL: 66 mg/dL (ref >50)
LDL CHOLESTEROL (CALC): 71 mg/dL
Non-HDL Cholesterol (Calc): 89 mg/dL (calc) (ref <130)
Total CHOL/HDL Ratio: 2.3 (calc) (ref <5.0)
Triglycerides: 97 mg/dL (ref <150)

## 2016-11-11 LAB — BASIC METABOLIC PANEL WITH GFR
BUN: 16 mg/dL (ref 7–25)
CALCIUM: 10 mg/dL (ref 8.6–10.4)
CHLORIDE: 107 mmol/L (ref 98–110)
CO2: 25 mmol/L (ref 20–32)
Creat: 0.73 mg/dL (ref 0.60–0.88)
GFR, Est African American: 85 mL/min/{1.73_m2} (ref >=60)
GFR, Est Non African American: 73 mL/min/{1.73_m2} (ref >=60)
GLUCOSE: 94 mg/dL (ref 65–99)
Potassium: 4.3 mmol/L (ref 3.5–5.3)
Sodium: 141 mmol/L (ref 135–146)

## 2016-11-11 LAB — HEMOGLOBIN A1C
HEMOGLOBIN A1C: 5.1 %{Hb} (ref <5.7)
MEAN PLASMA GLUCOSE: 100 (calc)
eAG (mmol/L): 5.5 (calc)

## 2016-11-11 LAB — TSH: TSH: 1.25 mIU/L (ref 0.40–4.50)

## 2016-11-11 LAB — VITAMIN D 25 HYDROXY (VIT D DEFICIENCY, FRACTURES): VIT D 25 HYDROXY: 54 ng/mL (ref 30–100)

## 2016-11-12 ENCOUNTER — Encounter: Payer: Self-pay | Admitting: *Deleted

## 2016-11-14 ENCOUNTER — Encounter: Payer: Self-pay | Admitting: Internal Medicine

## 2016-11-17 ENCOUNTER — Encounter: Payer: Self-pay | Admitting: Internal Medicine

## 2016-11-23 ENCOUNTER — Ambulatory Visit (INDEPENDENT_AMBULATORY_CARE_PROVIDER_SITE_OTHER): Payer: Medicare Other | Admitting: *Deleted

## 2016-11-23 DIAGNOSIS — Z23 Encounter for immunization: Secondary | ICD-10-CM

## 2016-12-16 ENCOUNTER — Other Ambulatory Visit: Payer: Self-pay

## 2016-12-16 DIAGNOSIS — Z1211 Encounter for screening for malignant neoplasm of colon: Secondary | ICD-10-CM

## 2016-12-16 DIAGNOSIS — Z1212 Encounter for screening for malignant neoplasm of rectum: Principal | ICD-10-CM

## 2016-12-16 LAB — POC HEMOCCULT BLD/STL (HOME/3-CARD/SCREEN)
Card #3 Fecal Occult Blood, POC: NEGATIVE
FECAL OCCULT BLD: NEGATIVE
Fecal Occult Blood, POC: NEGATIVE

## 2016-12-25 ENCOUNTER — Other Ambulatory Visit: Payer: Self-pay | Admitting: *Deleted

## 2016-12-25 MED ORDER — LEVOTHYROXINE SODIUM 150 MCG PO TABS: ORAL_TABLET | ORAL | 1 refills | Status: DC

## 2017-02-06 ENCOUNTER — Other Ambulatory Visit: Payer: Self-pay | Admitting: Internal Medicine

## 2017-02-10 NOTE — Progress Notes (Signed)
FOLLOW UP  Assessment and Plan:   Labile Hypertension Currently stable with lifestyle changes only Monitor blood pressure at home; patient to call if consistently greater than 130/80 Continue DASH diet.   Reminder to go to the ER if any CP, SOB, nausea, dizziness, severe HA, changes vision/speech, left arm numbness and tingling and jaw pain.  Cholesterol Continue medication Continue low cholesterol diet and exercise.  Check lipid panel.   Anxiety/Insomina Well managed by current regimen; continue medications - discussed risks associated with benzodiazepine use, recommended minimal necessary amount, frequent drug breaks Stress management techniques discussed, increase water, good sleep hygiene discussed, increase exercise, and increase veggies.   Vitamin D Def/ osteoporosis prevention She was instructed to increase to 10000 units from 4000 at last visit; she has been taking 20000 units daily - likely will need to reduce back down to 10000 units Check Vit D level  Continue diet and meds as discussed. Further disposition pending results of labs. Discussed med's effects and SE's.   Over 30 minutes of exam, counseling, chart review, and critical decision making was performed.   Future Appointments  Date Time Provider [place]  05/13/2017 10:30 AM Unk Pinto, MD [facility] None  12/01/2017  9:00 AM Unk Pinto, MD [facility] None   ----------------------------------------------------------------------------------------------------------------------  HPI 83 y.o. female  presents for 3 month follow up on labile hypertension, cholesterol,  GAD/insomina and vitamin D deficiency.   BMI is Body mass index is 24.17 kg/m., she has been working on diet and exercise. Wt Readings from Last 3 Encounters:  02/11/17 143 lb (64.9 kg)  11/10/16 142 lb 6.4 oz (64.6 kg)  04/23/16 134 lb 12.8 oz (61.1 kg)   she has a diagnosis of anxiety and insomnia and is currently on  longstanding regimen of PRN valium 5 mg, reports symptoms are well controlled on current regimen; she reports very occasional use - 1-2 times per week.    Today their BP is BP: 126/64  She does not workout. She denies chest pain, shortness of breath, dizziness.   She is on cholesterol medication and denies myalgias. Her cholesterol is at goal. The cholesterol last visit was:   Lab Results  Component Value Date   CHOL 155 11/10/2016   HDL 66 11/10/2016   LDLCALC 87 04/23/2016   TRIG 97 11/10/2016   CHOLHDL 2.3 11/10/2016    Last A1C in the office was:  Lab Results  Component Value Date   HGBA1C 5.1 11/10/2016   She is on thyroid medication. Her medication was not changed last visit.   Lab Results  Component Value Date   TSH 1.25 11/10/2016   Patient is on Vitamin D supplement but remained below goal at the last visit:    Lab Results  Component Value Date   VD25OH 54 11/10/2016        Current Medications:  Current Outpatient Medications on File Prior to Visit  Medication Sig  . cholecalciferol (VITAMIN D) 1000 UNITS tablet Take 2,000 Units by mouth 2 (two) times daily.   . clotrimazole-betamethasone (LOTRISONE) cream Apply 1 application topically 2 (two) times daily.  . diazepam (VALIUM) 5 MG tablet Take 1 tablet (5 mg total) by mouth every 8 (eight) hours as needed for anxiety.  Marland Kitchen levothyroxine (SYNTHROID) 150 MCG tablet Take 1 tablet by mouth  daily  . Potassium Gluconate 595 MG CAPS Take 595 mg by mouth daily.    . Probiotic Product (PROBIOTIC PO) Take 1 tablet by mouth daily.  . simvastatin (ZOCOR)  20 MG tablet TAKE 1 TABLET BY MOUTH AT  BEDTIME  . vitamin C (ASCORBIC ACID) 500 MG tablet Take 500 mg by mouth daily.    . Calcium Carbonate-Vitamin D (CALCIUM 600+D HIGH POTENCY) 600-400 MG-UNIT tablet Take 1 tablet by mouth daily.     No current facility-administered medications on file prior to visit.      Allergies:  Allergies  Allergen Reactions  . Ciprofloxacin    . Epinephrine Other (See Comments)    Heart raced   . Etodolac Hives  . Synephrine     Palpitations   . Flagyl [Metronidazole Hcl] Rash     Medical History:  Past Medical History:  Diagnosis Date  . Anxiety   . Breast cancer ([place])    left  . Colon polyp 2009   BENIGN POLYPOID  . Diverticulosis of colon (without mention of hemorrhage) 2009  . DJD (degenerative joint disease)   . Family history of malignant neoplasm of gastrointestinal tract 05/06/2011  . HOH (hard of hearing)    Right ear  . Hypercholesteremia   . Hypothyroidism   . IBS (irritable bowel syndrome)   . Internal hemorrhoids without mention of complication   . Sinus drainage   . Vitiligo   . Wears dentures    top  . Wears glasses    Family history- Reviewed and unchanged Social history- Reviewed and unchanged   Review of Systems:  Review of Systems  Constitutional: Negative for malaise/fatigue and weight loss.  HENT: Negative for hearing loss and tinnitus.   Eyes: Negative for blurred vision and double vision.  Respiratory: Negative for cough, shortness of breath and wheezing.   Cardiovascular: Negative for chest pain, palpitations, orthopnea, claudication and leg swelling.  Gastrointestinal: Negative for abdominal pain, blood in stool, constipation, diarrhea, heartburn, melena, nausea and vomiting.  Genitourinary: Negative.   Musculoskeletal: Negative for joint pain and myalgias.  Skin: Negative for rash.  Neurological: Negative for dizziness, tingling, sensory change, weakness and headaches.  Endo/Heme/Allergies: Negative for polydipsia.  Psychiatric/Behavioral: The patient is nervous/anxious (Intermittent, mild) and has insomnia (Intermittent, associated with anxiety - currently well controlled).   All other systems reviewed and are negative.   Physical Exam: BP 126/64   Pulse 61   Temp (!) 97.3 F (36.3 C)   Ht 5' 4.5" (1.638 m)   Wt 143 lb (64.9 kg)   SpO2 98%   BMI 24.17 kg/m  Wt  Readings from Last 3 Encounters:  02/11/17 143 lb (64.9 kg)  11/10/16 142 lb 6.4 oz (64.6 kg)  04/23/16 134 lb 12.8 oz (61.1 kg)   General Appearance: Well nourished, in no apparent distress. Eyes: PERRLA, EOMs, conjunctiva no swelling or erythema Sinuses: No Frontal/maxillary tenderness ENT/Mouth: Ext aud canals clear, TMs without erythema, bulging. No erythema, swelling, or exudate on post pharynx.  Tonsils not swollen or erythematous. Hearing normal.  Neck: Supple, thyroid normal.  Respiratory: Respiratory effort normal, BS equal bilaterally without rales, rhonchi, wheezing or stridor.  Cardio: RRR with no MRGs. Brisk peripheral pulses without edema.  Abdomen: Soft, + BS.  Non tender, no guarding, rebound, hernias, masses. Lymphatics: Non tender without lymphadenopathy.  Musculoskeletal: Full ROM, 5/5 strength, Normal gait Skin: Warm, dry without rashes, lesions, ecchymosis.  Neuro: Cranial nerves intact. No cerebellar symptoms.  Psych: Awake and oriented X 3, normal affect, Insight and Judgment appropriate.    Izora Ribas, NP 11:25 AM [facility]

## 2017-02-11 ENCOUNTER — Encounter: Payer: Self-pay | Admitting: Adult Health

## 2017-02-11 ENCOUNTER — Ambulatory Visit (INDEPENDENT_AMBULATORY_CARE_PROVIDER_SITE_OTHER): Payer: Medicare Other | Admitting: Adult Health

## 2017-02-11 VITALS — BP 126/64 | HR 61 | Temp 97.3°F | Ht 64.5 in | Wt 143.0 lb

## 2017-02-11 DIAGNOSIS — E039 Hypothyroidism, unspecified: Secondary | ICD-10-CM | POA: Diagnosis not present

## 2017-02-11 DIAGNOSIS — E782 Mixed hyperlipidemia: Secondary | ICD-10-CM

## 2017-02-11 DIAGNOSIS — Z79899 Other long term (current) drug therapy: Secondary | ICD-10-CM | POA: Diagnosis not present

## 2017-02-11 DIAGNOSIS — R0989 Other specified symptoms and signs involving the circulatory and respiratory systems: Secondary | ICD-10-CM | POA: Diagnosis not present

## 2017-02-11 DIAGNOSIS — F411 Generalized anxiety disorder: Secondary | ICD-10-CM | POA: Diagnosis not present

## 2017-02-11 DIAGNOSIS — E559 Vitamin D deficiency, unspecified: Secondary | ICD-10-CM | POA: Diagnosis not present

## 2017-02-11 DIAGNOSIS — M1711 Unilateral primary osteoarthritis, right knee: Secondary | ICD-10-CM

## 2017-02-11 MED ORDER — CLOTRIMAZOLE-BETAMETHASONE 1-0.05 % EX CREA: 1.0000 "application " | TOPICAL_CREAM | Freq: Two times a day (BID) | CUTANEOUS | 2 refills | Status: DC

## 2017-02-11 NOTE — Addendum Note (Signed)
Addended by: Chancy Hurter on: 02/11/2017 12:09 PM   Modules accepted: Orders

## 2017-02-12 LAB — BASIC METABOLIC PANEL WITH GFR
BUN/Creatinine Ratio: 24 (calc) — ABNORMAL HIGH (ref 6–22)
BUN: 21 mg/dL (ref 7–25)
CALCIUM: 9.6 mg/dL (ref 8.6–10.4)
CHLORIDE: 106 mmol/L (ref 98–110)
CO2: 27 mmol/L (ref 20–32)
Creat: 0.89 mg/dL — ABNORMAL HIGH (ref 0.60–0.88)
GFR, Est African American: 67 mL/min/{1.73_m2} (ref >=60)
GFR, Est Non African American: 57 mL/min/{1.73_m2} — ABNORMAL LOW (ref >=60)
GLUCOSE: 97 mg/dL (ref 65–99)
POTASSIUM: 4.3 mmol/L (ref 3.5–5.3)
Sodium: 140 mmol/L (ref 135–146)

## 2017-02-12 LAB — CBC WITH DIFFERENTIAL/PLATELET
BASOS PCT: 0.6 %
Basophils Absolute: 30 cells/uL (ref 0–200)
Eosinophils Absolute: 110 cells/uL (ref 15–500)
Eosinophils Relative: 2.2 %
HCT: 38.7 % (ref 35.0–45.0)
HEMOGLOBIN: 12.9 g/dL (ref 11.7–15.5)
Lymphs Abs: 1535 cells/uL (ref 850–3900)
MCH: 29.5 pg (ref 27.0–33.0)
MCHC: 33.3 g/dL (ref 32.0–36.0)
MCV: 88.6 fL (ref 80.0–100.0)
MPV: 10.2 fL (ref 7.5–12.5)
Monocytes Relative: 10.5 %
NEUTROS ABS: 2800 {cells}/uL (ref 1500–7800)
Neutrophils Relative %: 56 %
PLATELETS: 250 10*3/uL (ref 140–400)
RBC: 4.37 10*6/uL (ref 3.80–5.10)
RDW: 12.2 % (ref 11.0–15.0)
TOTAL LYMPHOCYTE: 30.7 %
WBC: 5 10*3/uL (ref 3.8–10.8)
WBCMIX: 525 {cells}/uL (ref 200–950)

## 2017-02-12 LAB — HEPATIC FUNCTION PANEL
AG Ratio: 2.2 (calc) (ref 1.0–2.5)
ALBUMIN MSPROF: 4.2 g/dL (ref 3.6–5.1)
ALT: 11 U/L (ref 6–29)
AST: 16 U/L (ref 10–35)
Alkaline phosphatase (APISO): 61 U/L (ref 33–130)
BILIRUBIN DIRECT: 0.2 mg/dL (ref 0.0–0.2)
BILIRUBIN INDIRECT: 0.9 mg/dL (ref 0.2–1.2)
BILIRUBIN TOTAL: 1.1 mg/dL (ref 0.2–1.2)
Globulin: 1.9 g/dL (calc) (ref 1.9–3.7)
TOTAL PROTEIN: 6.1 g/dL (ref 6.1–8.1)

## 2017-02-12 LAB — LIPID PANEL
CHOL/HDL RATIO: 2.2 (calc) (ref <5.0)
Cholesterol: 153 mg/dL (ref <200)
HDL: 69 mg/dL (ref >50)
LDL CHOLESTEROL (CALC): 65 mg/dL
Non-HDL Cholesterol (Calc): 84 mg/dL (calc) (ref <130)
Triglycerides: 109 mg/dL (ref <150)

## 2017-02-12 LAB — TSH: TSH: 1.37 m[IU]/L (ref 0.40–4.50)

## 2017-02-12 LAB — VITAMIN D 25 HYDROXY (VIT D DEFICIENCY, FRACTURES): VIT D 25 HYDROXY: 80 ng/mL (ref 30–100)

## 2017-04-09 ENCOUNTER — Other Ambulatory Visit: Payer: Self-pay | Admitting: Internal Medicine

## 2017-04-09 DIAGNOSIS — Z1231 Encounter for screening mammogram for malignant neoplasm of breast: Secondary | ICD-10-CM

## 2017-05-13 ENCOUNTER — Encounter: Payer: Self-pay | Admitting: Internal Medicine

## 2017-05-13 ENCOUNTER — Other Ambulatory Visit: Payer: Self-pay | Admitting: *Deleted

## 2017-05-13 ENCOUNTER — Ambulatory Visit (INDEPENDENT_AMBULATORY_CARE_PROVIDER_SITE_OTHER): Payer: Medicare Other | Admitting: Internal Medicine

## 2017-05-13 VITALS — BP 164/84 | HR 72 | Temp 97.4°F | Resp 16 | Ht 64.5 in | Wt 141.8 lb

## 2017-05-13 DIAGNOSIS — R300 Dysuria: Secondary | ICD-10-CM | POA: Diagnosis not present

## 2017-05-13 DIAGNOSIS — R0989 Other specified symptoms and signs involving the circulatory and respiratory systems: Secondary | ICD-10-CM

## 2017-05-13 DIAGNOSIS — E782 Mixed hyperlipidemia: Secondary | ICD-10-CM | POA: Diagnosis not present

## 2017-05-13 DIAGNOSIS — R7303 Prediabetes: Secondary | ICD-10-CM

## 2017-05-13 DIAGNOSIS — E039 Hypothyroidism, unspecified: Secondary | ICD-10-CM

## 2017-05-13 DIAGNOSIS — R7309 Other abnormal glucose: Secondary | ICD-10-CM

## 2017-05-13 DIAGNOSIS — E559 Vitamin D deficiency, unspecified: Secondary | ICD-10-CM

## 2017-05-13 DIAGNOSIS — Z79899 Other long term (current) drug therapy: Secondary | ICD-10-CM

## 2017-05-13 MED ORDER — SIMVASTATIN 20 MG PO TABS: 20.0000 mg | ORAL_TABLET | Freq: Every day | ORAL | 1 refills | Status: DC

## 2017-05-13 NOTE — Patient Instructions (Signed)

## 2017-05-13 NOTE — Progress Notes (Signed)
This very nice 84 y.o. [name] presents for 6 month follow up with HTN, HLD, Pre-Diabetes and Vitamin D Deficiency. Today, she's also c/o dysuria.      Patient is monitored for labile HTN & BP has not been monitored at home. Today's BP is elevated at 164/84 & rechecked at 155/82. Patient has had no complaints of any cardiac type chest pain, palpitations, dyspnea / orthopnea / PND, dizziness, claudication, or dependent edema.     Hyperlipidemia is controlled with diet & meds. Patient denies myalgias or other med SE's. Last Lipids were  Lab Results  Component Value Date   CHOL 153 02/11/2017   HDL 69 02/11/2017   LDLCALC 65 02/11/2017   TRIG 109 02/11/2017   CHOLHDL 2.2 02/11/2017      Also, the patient is also monitored expectantly for PreDiabetes and has had no symptoms of reactive hypoglycemia, diabetic polys, paresthesias or visual blurring.  Last A1c was Normal & at goal: Lab Results  Component Value Date   HGBA1C 5.1 11/10/2016      Patient has been on Thyroid replacement circa 2000. Further, the patient also has history of Vitamin D Deficiency ("37"/2011) and she supplements vitamin D without any suspected side-effects. Last vitamin D was at goal: Lab Results  Component Value Date   VD25OH 8 02/11/2017   Current Outpatient Medications on File Prior to Visit  Medication Sig  . Calcium Carbonate-Vitamin D (CALCIUM 600+D HIGH POTENCY) 600-400 MG-UNIT tablet Take 1 tablet by mouth daily.    . Cholecalciferol (VITAMIN D PO) Take 5,000 Units by mouth 2 (two) times daily.  . clotrimazole-betamethasone (LOTRISONE) cream Apply 1 application topically 2 (two) times daily.  . diazepam (VALIUM) 5 MG tablet Take 1 tablet (5 mg total) by mouth every 8 (eight) hours as needed for anxiety.  Marland Kitchen levothyroxine (SYNTHROID) 150 MCG tablet Take 1 tablet by mouth  daily  . Potassium Gluconate 595 MG CAPS Take 595 mg by mouth daily.    . Probiotic Product (PROBIOTIC PO) Take 1 tablet by mouth daily.     No current facility-administered medications on file prior to visit.    Allergies  Allergen Reactions  . Ciprofloxacin   . Epinephrine Other (See Comments)    Heart raced   . Etodolac Hives  . Synephrine     Palpitations   . Flagyl [Metronidazole Hcl] Rash   PMHx:   Past Medical History:  Diagnosis Date  . Anxiety   . Breast cancer ([place])    left  . Colon polyp 2009   BENIGN POLYPOID  . Diverticulosis of colon (without mention of hemorrhage) 2009  . DJD (degenerative joint disease)   . Family history of malignant neoplasm of gastrointestinal tract 05/06/2011  . HOH (hard of hearing)    Right ear  . Hypercholesteremia   . Hypothyroidism   . IBS (irritable bowel syndrome)   . Internal hemorrhoids without mention of complication   . Primary osteoarthritis of right knee 11/26/2015   S/p TKA  . Sinus drainage   . Vitiligo   . Wears dentures    top  . Wears glasses    Immunization History  Administered Date(s) Administered  . Influenza Split 12/01/2010  . Influenza, High Dose Seasonal PF 11/12/2015, 11/23/2016  . Influenza-Unspecified 10/31/2012, 11/29/2014  . Pneumococcal Conjugate-13 10/18/2013  . Pneumococcal Polysaccharide-23 03/20/2010  . Td 10/17/2012   Past Surgical History:  Procedure Laterality Date  . ABDOMINAL HYSTERECTOMY    . bladder tac    .  BREAST LUMPECTOMY  2004   left lump-snbx  . BUNIONECTOMY     bilateral  . COLONOSCOPY  2009   several  . CYSTOCELE REPAIR  2009  . EYE SURGERY Bilateral    Cataract removal  . KNEE ARTHROSCOPY Left 01/17/2013   Procedure: ARTHROSCOPY LEFT KNEE;  Surgeon: Hessie Dibble, MD;  Location: [facility];  Service: Orthopedics;  Laterality: Left;  partial medial and partial lateral chondroplasty and removal loose body  . right shoulder  2003   bone spur removed-rcr  . TONSILLECTOMY    . TOTAL KNEE ARTHROPLASTY Right 11/26/2015   Procedure: TOTAL KNEE ARTHROPLASTY;  Surgeon: Melrose Nakayama, MD;   Location: [facility];  Service: Orthopedics;  Laterality: Right;  . WRIST FRACTURE SURGERY  2003   left-lipoma   FHx:    Reviewed / unchanged  SHx:    Reviewed / unchanged  Systems Review:  Constitutional: Denies fever, chills, wt changes, headaches, insomnia, fatigue, night sweats, change in appetite. Eyes: Denies redness, blurred vision, diplopia, discharge, itchy, watery eyes.  ENT: Denies discharge, congestion, post nasal drip, epistaxis, sore throat, earache, hearing loss, dental pain, tinnitus, vertigo, sinus pain, snoring.  CV: Denies chest pain, palpitations, irregular heartbeat, syncope, dyspnea, diaphoresis, orthopnea, PND, claudication or edema. Respiratory: denies cough, dyspnea, DOE, pleurisy, hoarseness, laryngitis, wheezing.  Gastrointestinal: Denies dysphagia, odynophagia, heartburn, reflux, water brash, abdominal pain or cramps, nausea, vomiting, bloating, diarrhea, constipation, hematemesis, melena, hematochezia  or hemorrhoids. Genitourinary: Denies dysuria, frequency, urgency, nocturia, hesitancy, discharge, hematuria or flank pain. Musculoskeletal: Denies arthralgias, myalgias, stiffness, jt. swelling, pain, limping or strain/sprain.  Skin: Denies pruritus, rash, hives, warts, acne, eczema or change in skin lesion(s). Neuro: No weakness, tremor, incoordination, spasms, paresthesia or pain. Psychiatric: Denies confusion, memory loss or sensory loss. Endo: Denies change in weight, skin or hair change.  Heme/Lymph: No excessive bleeding, bruising or enlarged lymph nodes.  Physical Exam  BP (!) 164/84   Pulse 72   Temp (!) 97.4 F (36.3 C)   Resp 16   Ht 5' 4.5" (1.638 m)   Wt 141 lb 12.8 oz (64.3 kg)   BMI 23.96 kg/m   Appears  well nourished, well groomed  and in no distress.  Eyes: PERRLA, EOMs, conjunctiva no swelling or erythema. Sinuses: No frontal/maxillary tenderness ENT/Mouth: EAC's clear, TM's nl w/o erythema, bulging. Nares clear w/o erythema, swelling,  exudates. Oropharynx clear without erythema or exudates. Oral hygiene is good. Tongue normal, non obstructing. Hearing intact.  Neck: Supple. Thyroid not palpable. Car 2+/2+ without bruits, nodes or JVD. Chest: Respirations nl with BS clear & equal w/o rales, rhonchi, wheezing or stridor.  Cor: Heart sounds normal w/ regular rate and rhythm without sig. murmurs, gallops, clicks or rubs. Peripheral pulses normal and equal  without edema.  Abdomen: Soft & bowel sounds normal. Non-tender w/o guarding, rebound, hernias, masses or organomegaly.  Lymphatics: Unremarkable.  Musculoskeletal: Full ROM all peripheral extremities, joint stability, 5/5 strength and normal gait.  Skin: Warm, dry without exposed rashes, lesions or ecchymosis apparent.  Neuro: Cranial nerves intact, reflexes equal bilaterally. Sensory-motor testing grossly intact. Tendon reflexes grossly intact.  Pysch: Alert & oriented x 3.  Insight and judgement nl & appropriate. No ideations.  Assessment and Plan:  1. Labile hypertension  - Continue medication, monitor blood pressure at home.  - Continue DASH diet. Reminder to go to the ER if any CP,  SOB, nausea, dizziness, severe HA, changes vision/speech.  - CBC with Differential/Platelet - BASIC METABOLIC PANEL  WITH GFR - Magnesium - TSH  2. Hyperlipidemia, mixed  - Continue diet/meds, exercise,& lifestyle modifications.  - Continue monitor periodic cholesterol/liver & renal functions   - Hepatic function panel - Lipid panel - TSH  3. Abnormal glucose  - Continue diet, exercise,  - lifestyle modifications.  - Monitor appropriate labs. - Hemoglobin A1c - Insulin, random  4. Vitamin D deficiency  - Continue supplementation.  - VITAMIN D 25 Hydroxyl  5. Prediabetes  - Hemoglobin A1c - Insulin, random  6. Hypothyroidism  - TSH  7. Medication management  - CBC with Differential/Platelet - BASIC METABOLIC PANEL WITH GFR - Hepatic function panel -  Magnesium - Lipid panel - TSH - Hemoglobin A1c - Insulin, random - VITAMIN D 25 Hydroxyl  8. Dysuria  - Urinalysis, Routine w reflex microscopic - Urine Culture      Discussed  regular exercise, BP monitoring, weight control to achieve/maintain BMI less than 25 and discussed med and SE's. Recommended labs to assess and monitor clinical status with further disposition pending results of labs. Over 30 minutes of exam, counseling, chart review was performed.

## 2017-05-14 LAB — URINE CULTURE
MICRO NUMBER: 90326604
SPECIMEN QUALITY:: ADEQUATE

## 2017-05-14 LAB — BASIC METABOLIC PANEL WITH GFR
BUN: 17 mg/dL (ref 7–25)
CALCIUM: 9.8 mg/dL (ref 8.6–10.4)
CO2: 28 mmol/L (ref 20–32)
CREATININE: 0.87 mg/dL (ref 0.60–0.88)
Chloride: 106 mmol/L (ref 98–110)
GFR, Est African American: 68 mL/min/{1.73_m2} (ref >=60)
GFR, Est Non African American: 59 mL/min/{1.73_m2} — ABNORMAL LOW (ref >=60)
GLUCOSE: 90 mg/dL (ref 65–99)
Potassium: 4.3 mmol/L (ref 3.5–5.3)
Sodium: 141 mmol/L (ref 135–146)

## 2017-05-14 LAB — LIPID PANEL
CHOL/HDL RATIO: 2.6 (calc) (ref <5.0)
CHOLESTEROL: 173 mg/dL (ref <200)
HDL: 66 mg/dL (ref >50)
LDL Cholesterol (Calc): 87 mg/dL (calc)
Non-HDL Cholesterol (Calc): 107 mg/dL (calc) (ref <130)
TRIGLYCERIDES: 106 mg/dL (ref <150)

## 2017-05-14 LAB — CBC WITH DIFFERENTIAL/PLATELET
Basophils Absolute: 41 cells/uL (ref 0–200)
Basophils Relative: 0.7 %
EOS ABS: 162 {cells}/uL (ref 15–500)
Eosinophils Relative: 2.8 %
HCT: 38.2 % (ref 35.0–45.0)
HEMOGLOBIN: 13.3 g/dL (ref 11.7–15.5)
Lymphs Abs: 1763 cells/uL (ref 850–3900)
MCH: 30.3 pg (ref 27.0–33.0)
MCHC: 34.8 g/dL (ref 32.0–36.0)
MCV: 87 fL (ref 80.0–100.0)
MONOS PCT: 9.7 %
MPV: 10.2 fL (ref 7.5–12.5)
NEUTROS ABS: 3271 {cells}/uL (ref 1500–7800)
Neutrophils Relative %: 56.4 %
Platelets: 234 10*3/uL (ref 140–400)
RBC: 4.39 10*6/uL (ref 3.80–5.10)
RDW: 12.3 % (ref 11.0–15.0)
Total Lymphocyte: 30.4 %
WBC: 5.8 10*3/uL (ref 3.8–10.8)
WBCMIX: 563 {cells}/uL (ref 200–950)

## 2017-05-14 LAB — VITAMIN D 25 HYDROXY (VIT D DEFICIENCY, FRACTURES): VIT D 25 HYDROXY: 15 ng/mL — AB (ref 30–100)

## 2017-05-14 LAB — INSULIN, RANDOM: Insulin: 7.2 u[IU]/mL (ref 2.0–19.6)

## 2017-05-14 LAB — HEPATIC FUNCTION PANEL
AG RATIO: 2.4 (calc) (ref 1.0–2.5)
ALT: 11 U/L (ref 6–29)
AST: 16 U/L (ref 10–35)
Albumin: 4.3 g/dL (ref 3.6–5.1)
Alkaline phosphatase (APISO): 63 U/L (ref 33–130)
BILIRUBIN INDIRECT: 0.8 mg/dL (ref 0.2–1.2)
Bilirubin, Direct: 0.2 mg/dL (ref 0.0–0.2)
Globulin: 1.8 g/dL (calc) — ABNORMAL LOW (ref 1.9–3.7)
TOTAL PROTEIN: 6.1 g/dL (ref 6.1–8.1)
Total Bilirubin: 1 mg/dL (ref 0.2–1.2)

## 2017-05-14 LAB — URINALYSIS, ROUTINE W REFLEX MICROSCOPIC
BILIRUBIN URINE: NEGATIVE
GLUCOSE, UA: NEGATIVE
Hgb urine dipstick: NEGATIVE
Ketones, ur: NEGATIVE
LEUKOCYTES UA: NEGATIVE
Nitrite: NEGATIVE
PROTEIN: NEGATIVE
SPECIFIC GRAVITY, URINE: 1.014 (ref 1.001–1.03)
pH: 5.5 (ref 5.0–8.0)

## 2017-05-14 LAB — HEMOGLOBIN A1C
HEMOGLOBIN A1C: 5.1 %{Hb} (ref <5.7)
MEAN PLASMA GLUCOSE: 100 (calc)
eAG (mmol/L): 5.5 (calc)

## 2017-05-14 LAB — TSH: TSH: 1.37 mIU/L (ref 0.40–4.50)

## 2017-05-14 LAB — MAGNESIUM: MAGNESIUM: 2 mg/dL (ref 1.5–2.5)

## 2017-05-17 ENCOUNTER — Encounter: Payer: Self-pay | Admitting: *Deleted

## 2017-05-21 ENCOUNTER — Ambulatory Visit
Admission: RE | Admit: 2017-05-21 | Discharge: 2017-05-21 | Disposition: A | Discharge status: Home / Self Care | Payer: Medicare Other | Source: Ambulatory Visit | Attending: Internal Medicine | Admitting: Internal Medicine

## 2017-05-21 DIAGNOSIS — Z1231 Encounter for screening mammogram for malignant neoplasm of breast: Secondary | ICD-10-CM

## 2017-06-02 DIAGNOSIS — M79672 Pain in left foot: Secondary | ICD-10-CM | POA: Diagnosis not present

## 2017-06-02 DIAGNOSIS — M1712 Unilateral primary osteoarthritis, left knee: Secondary | ICD-10-CM | POA: Diagnosis not present

## 2017-06-24 ENCOUNTER — Encounter: Payer: Self-pay | Admitting: Internal Medicine

## 2017-06-24 ENCOUNTER — Ambulatory Visit (INDEPENDENT_AMBULATORY_CARE_PROVIDER_SITE_OTHER): Payer: Medicare Other | Admitting: Internal Medicine

## 2017-06-24 ENCOUNTER — Other Ambulatory Visit: Payer: Self-pay | Admitting: *Deleted

## 2017-06-24 VITALS — BP 126/60 | HR 80 | Temp 97.5°F | Resp 18 | Ht 64.5 in | Wt 141.0 lb

## 2017-06-24 DIAGNOSIS — J029 Acute pharyngitis, unspecified: Secondary | ICD-10-CM

## 2017-06-24 DIAGNOSIS — J40 Bronchitis, not specified as acute or chronic: Secondary | ICD-10-CM | POA: Diagnosis not present

## 2017-06-24 MED ORDER — PROMETHAZINE-DM 6.25-15 MG/5ML PO SYRP: ORAL_SOLUTION | ORAL | 1 refills | Status: DC

## 2017-06-24 MED ORDER — AZITHROMYCIN 250 MG PO TABS: ORAL_TABLET | ORAL | 1 refills | Status: DC

## 2017-06-24 MED ORDER — PREDNISONE 20 MG PO TABS: ORAL_TABLET | ORAL | 0 refills | Status: DC

## 2017-06-24 MED ORDER — PROMETHAZINE-CODEINE 6.25-10 MG/5ML PO SYRP: ORAL_SOLUTION | ORAL | 1 refills | Status: DC

## 2017-06-24 NOTE — Progress Notes (Signed)
  Subjective:    [name], female    DOB: 1925/08/06, 84 y.o.   MRN: [id number]  HPI  This very nice 84 yo [name] presents with c/o Head & chest congestion x 10 day - with thick yellowish nasal drainage and similar sputum. Denies fevers, chills, dyspnea.   Medication Sig  . CALCIUM+D 600-400  Take 1 tablet  daily.    Marland Kitchen VITAMIN D  5,000 Units  Take   2  times daily.  Marland Kitchen LOTRISONE cream Apply 1 application topically 2  times daily.  . diazepam  5 MG tablet Take 1 tab every 8  hours as needed for anxiety.  Marland Kitchen levothyroxine  150 MCG tablet Take 1 tab daily  . Probiotic  Take 1 tab daily.  . simvastatin20 MG tablet Take 1 tab at bedtime.  . Potassium  595 MG CAPS Take  daily.     Allergies  Allergen Reactions  . Ciprofloxacin   . Epinephrine Other (See Comments)    Heart raced   . Etodolac Hives  . Synephrine     Palpitations   . Flagyl [Metronidazole Hcl] Rash   Past Medical History:  Diagnosis Date  . Anxiety   . Breast cancer ([place])    left  . Colon polyp 2009   BENIGN POLYPOID  . Diverticulosis of colon (without mention of hemorrhage) 2009  . DJD (degenerative joint disease)   . Family history of malignant neoplasm of gastrointestinal tract 05/06/2011  . HOH (hard of hearing)    Right ear  . Hypercholesteremia   . Hypothyroidism   . IBS (irritable bowel syndrome)   . Internal hemorrhoids without mention of complication   . Primary osteoarthritis of right knee 11/26/2015   S/p TKA  . Sinus drainage   . Vitiligo   . Wears dentures    top  . Wears glasses    Review of Systems  10 point systems review negative except as above.    Objective:   Physical Exam  BP 126/60   Pulse 80   Temp (!) 97.5 F (36.4 C)   Resp 18   Ht 5' 4.5" (1.638 m)   Wt 141 lb (64 kg)   BMI 23.83 kg/m   In No Distress  HEENT - Eac's patent. TM's Nl. EOM's full. PERRLA. NasoOroPharynx clear. Neck - supple. Nl Thyroid. Carotids 2+ & No bruits, nodes, JVD Chest - Decreased BS  w/scattered rales &  Rhonchi, no  wheezes. Cor - Nl HS. RRR w/o sig MGR. PP 1(+). No edema. MS- FROM w/o deformities. Muscle power, tone and bulk Nl. Gait Nl. Neuro - No obvious Cr N abnormalities.  Nl w/o focal abnormalities. Skin - exposed clear w/o rash cyanosis or icterus.    Assessment & Plan:   1. Bronchitis  2. Pharyngitis  - predniSONE (DELTASONE) 20 MG tablet; 1 tab 3 x day for 3 days, then 1 tab 2 x day for 3 days, then 1 tab 1 x day for 5 days  Dispense: 20 tablet;   - azithromycin (ZITHROMAX) 250 MG tablet; Take 2 tablets (500 mg) on  Day 1,  followed by 1 tablet (250 mg) once daily on Days 2 through 5.  Dispense: 6 each; Refill: 1

## 2017-07-20 DIAGNOSIS — D3131 Benign neoplasm of right choroid: Secondary | ICD-10-CM | POA: Diagnosis not present

## 2017-07-20 DIAGNOSIS — D3132 Benign neoplasm of left choroid: Secondary | ICD-10-CM | POA: Diagnosis not present

## 2017-07-20 DIAGNOSIS — Z961 Presence of intraocular lens: Secondary | ICD-10-CM | POA: Diagnosis not present

## 2017-07-20 DIAGNOSIS — H43393 Other vitreous opacities, bilateral: Secondary | ICD-10-CM | POA: Diagnosis not present

## 2017-07-20 LAB — HM DIABETES EYE EXAM

## 2017-07-29 ENCOUNTER — Encounter: Payer: Self-pay | Admitting: *Deleted

## 2017-08-20 ENCOUNTER — Other Ambulatory Visit: Payer: Self-pay | Admitting: Internal Medicine

## 2017-08-20 DIAGNOSIS — J029 Acute pharyngitis, unspecified: Secondary | ICD-10-CM

## 2017-08-20 MED ORDER — PREDNISONE 20 MG PO TABS: ORAL_TABLET | ORAL | 0 refills | Status: DC

## 2017-08-26 NOTE — Progress Notes (Signed)
MEDICARE ANNUAL WELLNESS VISIT AND FOLLOW UP  Assessment:   Diagnoses and all orders for this visit:  Encounter for Medicare annual wellness exam  Labile hypertension At goal off of medications Monitor blood pressure at home; call if consistently over 130/80 Continue DASH diet.   Reminder to go to the ER if any CP, SOB, nausea, dizziness, severe HA, changes vision/speech, left arm numbness and tingling and jaw pain.  Internal hemorrhoids PRN medicaitons  Allergic rhinitis, unspecified seasonality, unspecified trigger Continue OTC allergy pills PRN  Diverticulosis Followed by GI  Polyp of colon, unspecified part of colon, unspecified type Followed by GI, ? No further colonoscopies due to age, hemoccult annually at CPE   Hypothyroidism, unspecified type continue medications the same pending lab results reminded to take on an empty stomach 30-7mins before food.  check TSH level  Osteoarthritis of multiple joints, unspecified osteoarthritis type Followed by ortho, s/p TKA   Vitamin D deficiency ? Compliance, she reports she is taking 5000 IU BID Continue to recommend supplementation for goal of 70-100 Check vitamin D level  Personal history of malignant neoplasm of breast Continue annual mammograms  Mixed hyperlipidemia Continue medications Continue low cholesterol diet and exercise.  Defer checking as would not pursue aggressively secondary to age  Medication management CBC, CMP/GFR, magnesium  Insomnia, unspecified type Doing well on rare valium use PRN  Generalized anxiety disorder Not currently well managed; not on daily agent, discussed SNRIs - will try low dose effexor XR Stress management techniques discussed, increase water, good sleep hygiene discussed, increase exercise, and increase veggies.   BMI 23.0-23.9, adult Continue to recommend diet heavy in fruits and veggies and low in animal meats, cheeses, and dairy products, appropriate calorie  intake Discuss exercise recommendations routinely Continue to monitor weight at each visit    Over 40 minutes of exam, counseling, chart review and critical decision making was performed Future Appointments  Date Time Provider [place]  12/01/2017  9:00 AM Unk Pinto, MD [facility] None     Plan:   During the course of the visit the patient was educated and counseled about appropriate screening and preventive services including:    Pneumococcal vaccine   Prevnar 13  Influenza vaccine  Td vaccine  Screening electrocardiogram  Bone densitometry screening  Colorectal cancer screening  Diabetes screening  Glaucoma screening  Nutrition counseling   Advanced directives: requested   Subjective:  Alexandria Davies is a 84 y.o. female who presents for Medicare Annual Wellness Visit and 3 month follow up.   she has a diagnosis of anxiety and has been on valium 5 mg TID PRN. she currently takes very rarely, takes 2 a month or so when she cannot sleep, reports she does have constant daytime anxiety and worrying since her son had colon cancer many years ago. She does not like taking valium as this makes her drowsy during the day. Not currently on a daily agent.  BMI is Body mass index is 23.66 kg/m., she has been working on diet and exercise. Wt Readings from Last 3 Encounters:  08/27/17 140 lb (63.5 kg)  06/24/17 141 lb (64 kg)  05/13/17 141 lb 12.8 oz (64.3 kg)   She does not currently check BP at home, today their BP is BP: 136/82 She does not workout. She denies chest pain, shortness of breath, dizziness.   She is on cholesterol medication (simvastatin 20 mg daily) and denies myalgias. Her cholesterol is at goal. The cholesterol last visit was:  Lab Results  Component Value Date   CHOL 173 05/13/2017   HDL 66 05/13/2017   LDLCALC 87 05/13/2017   TRIG 106 05/13/2017   CHOLHDL 2.6 05/13/2017   She has been working on diet and exercise for glucose  management, and denies foot ulcerations, increased appetite, nausea, paresthesia of the feet, polydipsia, polyuria, visual disturbances, vomiting and weight loss. Last A1C in the office was:  Lab Results  Component Value Date   HGBA1C 5.1 05/13/2017   She is on thyroid medication. Her medication was not changed last visit.   Lab Results  Component Value Date   TSH 1.37 05/13/2017   Last GFR: Lab Results  Component Value Date   GFRNONAA 59 (L) 05/13/2017   Patient is on Vitamin D supplement.   Lab Results  Component Value Date   VD25OH 15 (L) 05/13/2017      Medication Review: Current Outpatient Medications on File Prior to Visit  Medication Sig Dispense Refill  . ASPIRIN 81 PO Take by mouth.    . Calcium Carbonate-Vitamin D (CALCIUM 600+D HIGH POTENCY) 600-400 MG-UNIT tablet Take 1 tablet by mouth daily.      . Cholecalciferol (VITAMIN D PO) Take 5,000 Units by mouth 2 (two) times daily.    . clotrimazole-betamethasone (LOTRISONE) cream Apply 1 application topically 2 (two) times daily. 45 g 2  . diazepam (VALIUM) 5 MG tablet Take 1 tablet (5 mg total) by mouth every 8 (eight) hours as needed for anxiety. 60 tablet 0  . levothyroxine (SYNTHROID) 150 MCG tablet Take 1 tablet by mouth  daily 90 tablet 1  . Probiotic Product (PROBIOTIC PO) Take 1 tablet by mouth daily.    . simvastatin (ZOCOR) 20 MG tablet Take 1 tablet (20 mg total) by mouth at bedtime. 90 tablet 1  . azithromycin (ZITHROMAX) 250 MG tablet Take 2 tablets (500 mg) on  Day 1,  followed by 1 tablet (250 mg) once daily on Days 2 through 5. 6 each 1  . predniSONE (DELTASONE) 20 MG tablet 1 tab 3 x day for 3 days, then 1 tab 2 x day for 3 days, then 1 tab 1 x day for 5 days 20 tablet 0  . promethazine-codeine (PHENERGAN WITH CODEINE) 6.25-10 MG/5ML syrup Take 1-2 tsp every 4 hours if needed for cough. (Patient not taking: Reported on 08/27/2017) 360 mL 1   No current facility-administered medications on file prior to  visit.     Allergies  Allergen Reactions  . Ciprofloxacin   . Epinephrine Other (See Comments)    Heart raced   . Etodolac Hives  . Synephrine     Palpitations   . Flagyl [Metronidazole Hcl] Rash    Current Problems (verified) Patient Active Problem List   Diagnosis Date Noted  . Insomnia 10/22/2014  . Generalized anxiety disorder 10/22/2014  . Labile hypertension 02/14/2014  . Mixed hyperlipidemia 02/14/2014  . Vitamin D deficiency 02/14/2014  . Medication management 02/14/2014  . Rhinitis, allergic 08/19/2011  . Diverticulosis 04/23/2011  . Colon polyps 04/23/2011  . PERSONAL HISTORY OF MALIGNANT NEOPLASM OF BREAST 12/07/2007  . Hypothyroidism 12/05/2007  . Osteoarthritis 12/05/2007  . Internal hemorrhoids 11/08/2002    Screening Tests Immunization History  Administered Date(s) Administered  . Influenza Split 12/01/2010  . Influenza, High Dose Seasonal PF 11/12/2015, 11/23/2016  . Influenza-Unspecified 10/31/2012, 11/29/2014  . Pneumococcal Conjugate-13 10/18/2013  . Pneumococcal Polysaccharide-23 03/20/2010  . Td 10/17/2012   Preventative care: Last colonoscopy: 2009 Mammogram:  04/2017 Bone Density:  04/2016 - T -1.5  Prior vaccinations: TD or Tdap: 2014  Influenza: 2018  Pneumococcal: 2012 Prevnar13: 2015 Shingles/Zostavax: declines  Names of Other Physician/Practitioners you currently use: 1. [facility] here for primary care 2. Dr. Katy Fitch, eye doctor, last visit 2019 3. Dr. Randol Kern, dentist, last visit 07/2017   Patient Care Team: Unk Pinto, MD as PCP - General (Internal Medicine) Melrose Nakayama, MD as Consulting Physician (Orthopedic Surgery) Deneise Lever, MD as Consulting Physician (Pulmonary Disease) Inda Castle, MD (Inactive) as Consulting Physician (Gastroenterology) Carolan Clines, MD as Consulting Physician (Urology)  SURGICAL HISTORY She  has a past surgical history that  includes Abdominal hysterectomy; bladder tac; Bunionectomy; right shoulder (2003); Wrist fracture surgery (2003); Cystocele repair (2009); Tonsillectomy; Knee arthroscopy (Left, 01/17/2013); Colonoscopy (2009); Eye surgery (Bilateral); Total knee arthroplasty (Right, 11/26/2015); and Breast lumpectomy (2004). FAMILY HISTORY Her family history includes Breast cancer in her sister and sister; Cancer (age of onset: 37) in her sister; Cancer (age of onset: 24) in her sister; Colon cancer in her son; Heart disease in her mother. SOCIAL HISTORY She  reports that she has never smoked. She has never used smokeless tobacco. She reports that she drinks alcohol. She reports that she does not use drugs.   MEDICARE WELLNESS OBJECTIVES: Physical activity: Current Exercise Habits: The patient does not participate in regular exercise at present(She does a lot of yard work, house work), Exercise limited by: orthopedic condition(s) Cardiac risk factors: Cardiac Risk Factors include: advanced age (>40men, >59 women);dyslipidemia Depression/mood screen:   Depression screen [facility] 2/9 08/27/2017  Decreased Interest 0  Down, Depressed, Hopeless 0  PHQ - 2 Score 0    ADLs:  In your present state of health, do you have any difficulty performing the following activities: 08/27/2017 06/24/2017  Hearing? N N  Vision? N N  Difficulty concentrating or making decisions? N N  Walking or climbing stairs? N N  Dressing or bathing? N N  Doing errands, shopping? N N  Some recent data might be hidden     Cognitive Testing  Alert? Yes  Normal Appearance?Yes  Oriented to person? Yes  Place? Yes   Time? Yes  Recall of three objects?  Yes  Can perform simple calculations? Yes  Displays appropriate judgment?Yes  Can read the correct time from a watch face?Yes  EOL planning: Does Patient Have a Medical Advance Directive?: Yes Type of Advance Directive: Healthcare Power of Attorney, Living will Does patient want to make changes  to medical advance directive?: No - Patient declined Copy of [place] in Chart?: Yes  Review of Systems  Constitutional: Negative for malaise/fatigue and weight loss.  HENT: Negative for hearing loss and tinnitus.   Eyes: Negative for blurred vision and double vision.  Respiratory: Negative for cough, sputum production, shortness of breath and wheezing.   Cardiovascular: Negative for chest pain, palpitations, orthopnea, claudication, leg swelling and PND.  Gastrointestinal: Negative for abdominal pain, blood in stool, constipation, diarrhea, heartburn, melena, nausea and vomiting.  Genitourinary: Negative.   Musculoskeletal: Negative for falls, joint pain and myalgias.  Skin: Negative for rash.  Neurological: Negative for dizziness, tingling, sensory change, weakness and headaches.  Endo/Heme/Allergies: Negative for polydipsia.  Psychiatric/Behavioral: Negative.  Negative for depression, memory loss, substance abuse and suicidal ideas. The patient is not nervous/anxious and does not have insomnia.   All other systems reviewed and are negative.    Objective:     Today's Vitals   08/27/17 1114  08/27/17 1159  BP: (!) 148/80 136/82  Pulse: 65   Temp: 98.1 F (36.7 C)   SpO2: 97%   Weight: 140 lb (63.5 kg)   Height: 5' 4.5" (1.638 m)    Body mass index is 23.66 kg/m.  General appearance: alert, no distress, WD/WN, female HEENT: normocephalic, sclerae anicteric, TMs pearly, nares patent, no discharge or erythema, pharynx normal Oral cavity: MMM, no lesions Neck: supple, no lymphadenopathy, no thyromegaly, no masses Heart: RRR, normal S1, S2, no murmurs Lungs: CTA bilaterally, no wheezes, rhonchi, or rales Abdomen: +bs, soft, non tender, non distended, no masses, no hepatomegaly, no splenomegaly Musculoskeletal: nontender, no swelling, no obvious deformity Extremities: no edema, no cyanosis, no clubbing Pulses: 2+ symmetric, upper and lower extremities, normal  cap refill Neurological: alert, oriented x 3, CN2-12 intact, strength normal upper extremities and lower extremities, sensation normal throughout, DTRs 2+ throughout, no cerebellar signs, gait normal Psychiatric: normal affect, behavior normal, pleasant   Medicare Attestation I have personally reviewed: The patient's medical and social history Their use of alcohol, tobacco or illicit drugs Their current medications and supplements The patient's functional ability including ADLs,fall risks, home safety risks, cognitive, and hearing and visual impairment Diet and physical activities Evidence for depression or mood disorders  The patient's weight, height, BMI, and visual acuity have been recorded in the chart.  I have made referrals, counseling, and provided education to the patient based on review of the above and I have provided the patient with a written personalized care plan for preventive services.     Izora Ribas, NP   08/27/2017

## 2017-08-27 ENCOUNTER — Encounter: Payer: Self-pay | Admitting: Adult Health

## 2017-08-27 ENCOUNTER — Ambulatory Visit (INDEPENDENT_AMBULATORY_CARE_PROVIDER_SITE_OTHER): Payer: Medicare Other | Admitting: Adult Health

## 2017-08-27 VITALS — BP 136/82 | HR 65 | Temp 98.1°F | Ht 64.5 in | Wt 140.0 lb

## 2017-08-27 DIAGNOSIS — R0989 Other specified symptoms and signs involving the circulatory and respiratory systems: Secondary | ICD-10-CM

## 2017-08-27 DIAGNOSIS — E559 Vitamin D deficiency, unspecified: Secondary | ICD-10-CM | POA: Diagnosis not present

## 2017-08-27 DIAGNOSIS — Z6823 Body mass index (BMI) 23.0-23.9, adult: Secondary | ICD-10-CM

## 2017-08-27 DIAGNOSIS — Z79899 Other long term (current) drug therapy: Secondary | ICD-10-CM

## 2017-08-27 DIAGNOSIS — R6889 Other general symptoms and signs: Secondary | ICD-10-CM

## 2017-08-27 DIAGNOSIS — Z0001 Encounter for general adult medical examination with abnormal findings: Secondary | ICD-10-CM | POA: Diagnosis not present

## 2017-08-27 DIAGNOSIS — K5790 Diverticulosis of intestine, part unspecified, without perforation or abscess without bleeding: Secondary | ICD-10-CM | POA: Diagnosis not present

## 2017-08-27 DIAGNOSIS — J309 Allergic rhinitis, unspecified: Secondary | ICD-10-CM

## 2017-08-27 DIAGNOSIS — K648 Other hemorrhoids: Secondary | ICD-10-CM

## 2017-08-27 DIAGNOSIS — M159 Polyosteoarthritis, unspecified: Secondary | ICD-10-CM

## 2017-08-27 DIAGNOSIS — G4700 Insomnia, unspecified: Secondary | ICD-10-CM | POA: Diagnosis not present

## 2017-08-27 DIAGNOSIS — E782 Mixed hyperlipidemia: Secondary | ICD-10-CM

## 2017-08-27 DIAGNOSIS — Z853 Personal history of malignant neoplasm of breast: Secondary | ICD-10-CM

## 2017-08-27 DIAGNOSIS — K635 Polyp of colon: Secondary | ICD-10-CM | POA: Diagnosis not present

## 2017-08-27 DIAGNOSIS — F411 Generalized anxiety disorder: Secondary | ICD-10-CM

## 2017-08-27 DIAGNOSIS — E039 Hypothyroidism, unspecified: Secondary | ICD-10-CM

## 2017-08-27 DIAGNOSIS — Z0000 Encounter for general adult medical examination without abnormal findings: Secondary | ICD-10-CM

## 2017-08-27 MED ORDER — VENLAFAXINE HCL ER 75 MG PO CP24: 75.0000 mg | ORAL_CAPSULE | Freq: Every day | ORAL | 2 refills | Status: DC

## 2017-08-27 NOTE — Patient Instructions (Signed)
Aim for 7+ servings of fruits and vegetables daily  80+ fluid ounces of water or unsweet tea for healthy kidneys   Limit animal fats in diet for cholesterol and heart health - choose grass fed whenever available  Aim for low stress - take time to unwind and care for your mental health  Aim for 150 min of moderate intensity exercise weekly for heart health, and weights twice weekly for bone health  Aim for 7-9 hours of sleep daily      Living With Anxiety After being diagnosed with an anxiety disorder, you may be relieved to know why you have felt or behaved a certain way. It is natural to also feel overwhelmed about the treatment ahead and what it will mean for your life. With care and support, you can manage this condition and recover from it. How to cope with anxiety Dealing with stress Stress is your body's reaction to life changes and events, both good and bad. Stress can last just a few hours or it can be ongoing. Stress can play a major role in anxiety, so it is important to learn both how to cope with stress and how to think about it differently. Talk with your health care provider or a counselor to learn more about stress reduction. He or she may suggest some stress reduction techniques, such as:  Music therapy. This can include creating or listening to music that you enjoy and that inspires you.  Mindfulness-based meditation. This involves being aware of your normal breaths, rather than trying to control your breathing. It can be done while sitting or walking.  Centering prayer. This is a kind of meditation that involves focusing on a word, phrase, or sacred image that is meaningful to you and that brings you peace.  Deep breathing. To do this, expand your stomach and inhale slowly through your nose. Hold your breath for 3-5 seconds. Then exhale slowly, allowing your stomach muscles to relax.  Self-talk. This is a skill where you identify thought patterns that lead to anxiety  reactions and correct those thoughts.  Muscle relaxation. This involves tensing muscles then relaxing them.  Choose a stress reduction technique that fits your lifestyle and personality. Stress reduction techniques take time and practice. Set aside 5-15 minutes a day to do them. Therapists can offer training in these techniques. The training may be covered by some insurance plans. Other things you can do to manage stress include:  Keeping a stress diary. This can help you learn what triggers your stress and ways to control your response.  Thinking about how you respond to certain situations. You may not be able to control everything, but you can control your reaction.  Making time for activities that help you relax, and not feeling guilty about spending your time in this way.  Therapy combined with coping and stress-reduction skills provides the best chance for successful treatment. Medicines Medicines can help ease symptoms. Medicines for anxiety include:  Anti-anxiety drugs.  Antidepressants.  Beta-blockers.  Medicines may be used as the main treatment for anxiety disorder, along with therapy, or if other treatments are not working. Medicines should be prescribed by a health care provider. Relationships Relationships can play a big part in helping you recover. Try to spend more time connecting with trusted friends and family members. Consider going to couples counseling, taking family education classes, or going to family therapy. Therapy can help you and others better understand the condition. How to recognize changes in your condition Everyone  has a different response to treatment for anxiety. Recovery from anxiety happens when symptoms decrease and stop interfering with your daily activities at home or work. This may mean that you will start to:  Have better concentration and focus.  Sleep better.  Be less irritable.  Have more energy.  Have improved memory.  It is  important to recognize when your condition is getting worse. Contact your health care provider if your symptoms interfere with home or work and you do not feel like your condition is improving. Where to find help and support: You can get help and support from these sources:  Self-help groups.  Online and [employer].  A trusted spiritual leader.  Couples counseling.  Family education classes.  Family therapy.  Follow these instructions at home:  Eat a healthy diet that includes plenty of vegetables, fruits, whole grains, low-fat dairy products, and lean protein. Do not eat a lot of foods that are high in solid fats, added sugars, or salt.  Exercise. Most adults should do the following: ? Exercise for at least 150 minutes each week. The exercise should increase your heart rate and make you sweat (moderate-intensity exercise). ? Strengthening exercises at least twice a week.  Cut down on caffeine, tobacco, alcohol, and other potentially harmful substances.  Get the right amount and quality of sleep. Most adults need 7-9 hours of sleep each night.  Make choices that simplify your life.  Take over-the-counter and prescription medicines only as told by your health care provider.  Avoid caffeine, alcohol, and certain over-the-counter cold medicines. These may make you feel worse. Ask your pharmacist which medicines to avoid.  Keep all follow-up visits as told by your health care provider. This is important. Questions to ask your health care provider  Would I benefit from therapy?  How often should I follow up with a health care provider?  How long do I need to take medicine?  Are there any long-term side effects of my medicine?  Are there any alternatives to taking medicine? Contact a health care provider if:  You have a hard time staying focused or finishing daily tasks.  You spend many hours a day feeling worried about everyday life.  You become exhausted  by worry.  You start to have headaches, feel tense, or have nausea.  You urinate more than normal.  You have diarrhea. Get help right away if:  You have a racing heart and shortness of breath.  You have thoughts of hurting yourself or others. If you ever feel like you may hurt yourself or others, or have thoughts about taking your own life, get help right away. You can go to your nearest emergency department or call:  Your local emergency services (911 in the [place]  A suicide crisis helpline, such as the [facility] at 207 128 3148. This is open 24-hours a day.  Summary  Taking steps to deal with stress can help calm you.  Medicines cannot cure anxiety disorders, but they can help ease symptoms.  Family, friends, and partners can play a big part in helping you recover from an anxiety disorder. This information is not intended to replace advice given to you by your health care provider. Make sure you discuss any questions you have with your health care provider. Document Released: 02/11/2016 Document Revised: 02/11/2016 Document Reviewed: 02/11/2016 Elsevier Interactive Patient Education  2018 [employer].     Venlafaxine extended-release capsules What is this medicine? VENLAFAXINE(VEN la fax een) is used to  treat depression, anxiety and panic disorder. This medicine may be used for other purposes; ask your health care provider or pharmacist if you have questions. COMMON BRAND NAME(S): Effexor XR What should I tell my health care provider before I take this medicine? They need to know if you have any of these conditions: -bleeding disorders -glaucoma -heart disease -high blood pressure -high cholesterol -kidney disease -liver disease -low levels of sodium in the blood -mania or bipolar disorder -seizures -suicidal thoughts, plans, or attempt; a previous suicide attempt by you or a family -take medicines that treat or prevent blood  clots -thyroid disease -an unusual or allergic reaction to venlafaxine, desvenlafaxine, other medicines, foods, dyes, or preservatives -pregnant or trying to get pregnant -breast-feeding How should I use this medicine? Take this medicine by mouth with a full glass of water. Follow the directions on the prescription label. Do not cut, crush, or chew this medicine. Take it with food. If needed, the capsule may be carefully opened and the entire contents sprinkled on a spoonful of cool applesauce. Swallow the applesauce/pellet mixture right away without chewing and follow with a glass of water to ensure complete swallowing of the pellets. Try to take your medicine at about the same time each day. Do not take your medicine more often than directed. Do not stop taking this medicine suddenly except upon the advice of your doctor. Stopping this medicine too quickly may cause serious side effects or your condition may worsen. A special MedGuide will be given to you by the pharmacist with each prescription and refill. Be sure to read this information carefully each time. Talk to your pediatrician regarding the use of this medicine in children. Special care may be needed. Overdosage: If you think you have taken too much of this medicine contact a poison control center or emergency room at once. NOTE: This medicine is only for you. Do not share this medicine with others. What if I miss a dose? If you miss a dose, take it as soon as you can. If it is almost time for your next dose, take only that dose. Do not take double or extra doses. What may interact with this medicine? Do not take this medicine with any of the following medications: -certain medicines for fungal infections like fluconazole, itraconazole, ketoconazole, posaconazole, voriconazole -cisapride -desvenlafaxine -dofetilide -dronedarone -duloxetine -levomilnacipran -linezolid -MAOIs like Carbex, Eldepryl, Marplan, Nardil, and  Parnate -methylene blue (injected into a vein) -milnacipran -pimozide -thioridazine -ziprasidone This medicine may also interact with the following medications: -amphetamines -aspirin and aspirin-like medicines -certain medicines for depression, anxiety, or psychotic disturbances -certain medicines for migraine headaches like almotriptan, eletriptan, frovatriptan, naratriptan, rizatriptan, sumatriptan, zolmitriptan -certain medicines for sleep -certain medicines that treat or prevent blood clots like dalteparin, enoxaparin, warfarin -cimetidine -clozapine -diuretics -fentanyl -furazolidone -indinavir -isoniazid -lithium -metoprolol -NSAIDS, medicines for pain and inflammation, like ibuprofen or naproxen -other medicines that prolong the QT interval (cause an abnormal heart rhythm) -procarbazine -rasagiline -supplements like St. John's wort, kava kava, valerian -tramadol -tryptophan This list may not describe all possible interactions. Give your health care provider a list of all the medicines, herbs, non-prescription drugs, or dietary supplements you use. Also tell them if you smoke, drink alcohol, or use illegal drugs. Some items may interact with your medicine. What should I watch for while using this medicine? Tell your doctor if your symptoms do not get better or if they get worse. Visit your doctor or health care professional for regular checks on your progress.  Because it may take several weeks to see the full effects of this medicine, it is important to continue your treatment as prescribed by your doctor. Patients and their families should watch out for new or worsening thoughts of suicide or depression. Also watch out for sudden changes in feelings such as feeling anxious, agitated, panicky, irritable, hostile, aggressive, impulsive, severely restless, overly excited and hyperactive, or not being able to sleep. If this happens, especially at the beginning of treatment or  after a change in dose, call your health care professional. This medicine can cause an increase in blood pressure. Check with your doctor for instructions on monitoring your blood pressure while taking this medicine. You may get drowsy or dizzy. Do not drive, use machinery, or do anything that needs mental alertness until you know how this medicine affects you. Do not stand or sit up quickly, especially if you are an older patient. This reduces the risk of dizzy or fainting spells. Alcohol may interfere with the effect of this medicine. Avoid alcoholic drinks. Your mouth may get dry. Chewing sugarless gum, sucking hard candy and drinking plenty of water will help. Contact your doctor if the problem does not go away or is severe. What side effects may I notice from receiving this medicine? Side effects that you should report to your doctor or health care professional as soon as possible: -allergic reactions like skin rash, itching or hives, swelling of the face, lips, or tongue -anxious -breathing problems -confusion -changes in vision -chest pain -confusion -elevated mood, decreased need for sleep, racing thoughts, impulsive behavior -eye pain -fast, irregular heartbeat -feeling faint or lightheaded, falls -feeling agitated, angry, or irritable -hallucination, loss of contact with reality -high blood pressure -loss of balance or coordination -palpitations -redness, blistering, peeling or loosening of the skin, including inside the mouth -restlessness, pacing, inability to keep still -seizures -stiff muscles -suicidal thoughts or other mood changes -trouble passing urine or change in the amount of urine -trouble sleeping -unusual bleeding or bruising -unusually weak or tired -vomiting Side effects that usually do not require medical attention (report to your doctor or health care professional if they continue or are bothersome): -change in sex drive or performance -change in appetite  or weight -constipation -dizziness -dry mouth -headache -increased sweating -nausea -tired This list may not describe all possible side effects. Call your doctor for medical advice about side effects. You may report side effects to FDA at 1-800-FDA-1088. Where should I keep my medicine? Keep out of the reach of children. Store at a controlled temperature between 20 and 25 degrees C (68 degrees and 77 degrees F), in a dry place. Throw away any unused medicine after the expiration date. NOTE: This sheet is a summary. It may not cover all possible information. If you have questions about this medicine, talk to your doctor, pharmacist, or health care provider.  2018 Elsevier/Gold Standard (2015-07-18 18:38:02)

## 2017-08-28 LAB — CBC WITH DIFFERENTIAL/PLATELET
BASOS ABS: 51 {cells}/uL (ref 0–200)
BASOS PCT: 0.8 %
EOS PCT: 2.8 %
Eosinophils Absolute: 179 cells/uL (ref 15–500)
HEMATOCRIT: 41.9 % (ref 35.0–45.0)
HEMOGLOBIN: 14.1 g/dL (ref 11.7–15.5)
LYMPHS ABS: 1760 {cells}/uL (ref 850–3900)
MCH: 29.7 pg (ref 27.0–33.0)
MCHC: 33.7 g/dL (ref 32.0–36.0)
MCV: 88.4 fL (ref 80.0–100.0)
MONOS PCT: 9.1 %
MPV: 10.1 fL (ref 7.5–12.5)
NEUTROS ABS: 3827 {cells}/uL (ref 1500–7800)
Neutrophils Relative %: 59.8 %
Platelets: 274 10*3/uL (ref 140–400)
RBC: 4.74 10*6/uL (ref 3.80–5.10)
RDW: 13.1 % (ref 11.0–15.0)
Total Lymphocyte: 27.5 %
WBC mixed population: 582 cells/uL (ref 200–950)
WBC: 6.4 10*3/uL (ref 3.8–10.8)

## 2017-08-28 LAB — COMPLETE METABOLIC PANEL WITH GFR
AG Ratio: 1.9 (calc) (ref 1.0–2.5)
ALBUMIN MSPROF: 4.4 g/dL (ref 3.6–5.1)
ALKALINE PHOSPHATASE (APISO): 69 U/L (ref 33–130)
ALT: 16 U/L (ref 6–29)
AST: 17 U/L (ref 10–35)
BUN: 18 mg/dL (ref 7–25)
CO2: 27 mmol/L (ref 20–32)
CREATININE: 0.79 mg/dL (ref 0.60–0.88)
Calcium: 10 mg/dL (ref 8.6–10.4)
Chloride: 106 mmol/L (ref 98–110)
GFR, EST NON AFRICAN AMERICAN: 66 mL/min/{1.73_m2} (ref >=60)
GFR, Est African American: 76 mL/min/{1.73_m2} (ref >=60)
GLOBULIN: 2.3 g/dL (ref 1.9–3.7)
GLUCOSE: 92 mg/dL (ref 65–99)
Potassium: 4.5 mmol/L (ref 3.5–5.3)
SODIUM: 141 mmol/L (ref 135–146)
TOTAL PROTEIN: 6.7 g/dL (ref 6.1–8.1)
Total Bilirubin: 1 mg/dL (ref 0.2–1.2)

## 2017-08-28 LAB — VITAMIN D 25 HYDROXY (VIT D DEFICIENCY, FRACTURES): VIT D 25 HYDROXY: 81 ng/mL (ref 30–100)

## 2017-08-28 LAB — TSH: TSH: 1.58 m[IU]/L (ref 0.40–4.50)

## 2017-09-06 ENCOUNTER — Ambulatory Visit: Payer: Self-pay | Admitting: Adult Health

## 2017-11-08 ENCOUNTER — Other Ambulatory Visit: Payer: Self-pay | Admitting: *Deleted

## 2017-11-08 MED ORDER — LEVOTHYROXINE SODIUM 150 MCG PO TABS: ORAL_TABLET | ORAL | 1 refills | Status: DC

## 2017-11-08 MED ORDER — SIMVASTATIN 20 MG PO TABS: 20.0000 mg | ORAL_TABLET | Freq: Every day | ORAL | 1 refills | Status: DC

## 2017-11-30 NOTE — Progress Notes (Signed)
[facility] Unk Pinto, M.D.     Alexandria Davies. Silverio Lay, P.A.-C Liane Comber, [place] [address] [place] [address] Telephone [phone] Telefax [phone]  Comprehensive Evaluation &  Examination     This very nice 84 y.o. [name] presents for a comprehensive evaluation and management of multiple medical co-morbidities.  Patient has been followed for labile HTN, HLD, Prediabetes  and Vitamin D Deficiency. Patient also has hx/o IBS-D with sx's of pc diarrhea.      Patient is followed expectantly for Labile HTN. Patient's BP has been controlled at home and patient denies any cardiac symptoms as chest pain, palpitations, shortness of breath, dizziness or ankle swelling. Today's BP is at goal - 126/64.     Patient's hyperlipidemia is controlled with diet and medications. Patient denies myalgias or other medication SE's. Last lipids were at goal: Lab Results  Component Value Date   CHOL 173 05/13/2017   HDL 66 05/13/2017   LDLCALC 87 05/13/2017   TRIG 106 05/13/2017   CHOLHDL 2.6 05/13/2017      Patient has been on Thyroid Replacement circa 2000.      Patient has been monitored expectantly for prediabetes predating and patient denies reactive hypoglycemic symptoms, visual blurring, diabetic polys or paresthesias. Last A1c was Normal & at goal: Lab Results  Component Value Date   HGBA1C 5.1 05/13/2017      Finally, patient has history of Vitamin D Deficiency ("37"/2011)  and last Vitamin D was at goal: Lab Results  Component Value Date   VD25OH 81 08/27/2017   Current Outpatient Medications on File Prior to Visit  Medication Sig  . Calcium Carbonate-Vitamin D (CALCIUM 600+D HIGH POTENCY) 600-400 MG-UNIT tablet Take 1 tablet by mouth daily.    . Cholecalciferol (VITAMIN D PO) Take 5,000 Units by mouth 2 (two) times daily.  . diazepam (VALIUM) 5 MG tablet Take 1 tablet (5 mg total) by mouth every  8 (eight) hours as needed for anxiety.  Marland Kitchen levothyroxine (SYNTHROID) 150 MCG tablet Take 1 tablet by mouth  daily  . Probiotic Product (PROBIOTIC PO) Take 1 tablet by mouth daily.  . simvastatin (ZOCOR) 20 MG tablet Take 1 tablet (20 mg total) by mouth at bedtime.   No current facility-administered medications on file prior to visit.    Allergies  Allergen Reactions  . Ciprofloxacin   . Epinephrine Other (See Comments)    Heart raced   . Etodolac Hives  . Synephrine     Palpitations   . Flagyl [Metronidazole Hcl] Rash   Past Medical History:  Diagnosis Date  . Anxiety   . Breast cancer ([facility])    left  . Colon polyp 2009   BENIGN POLYPOID  . Diverticulosis of colon (without mention of hemorrhage) 2009  . DJD (degenerative joint disease)   . Family history of malignant neoplasm of gastrointestinal tract 05/06/2011  . HOH (hard of hearing)    Right ear  . Hypercholesteremia   . Hypothyroidism   . IBS (irritable bowel syndrome)   . Internal hemorrhoids without mention of complication   . Primary osteoarthritis of right knee 11/26/2015   S/p TKA  . Sinus drainage   . Vitiligo   . Wears dentures    top  . Wears glasses    Health Maintenance  Topic Date Due  . INFLUENZA VACCINE  09/30/2017  . TETANUS/TDAP  10/18/2022  . DEXA SCAN  Completed  . PNA vac Low  Risk Adult  Completed   Immunization History  Administered Date(s) Administered  . Influenza Split 12/01/2010  . Influenza, High Dose Seasonal PF 11/12/2015, 11/23/2016  . Influenza-Unspecified 10/31/2012, 11/29/2014  . Pneumococcal Conjugate-13 10/18/2013  . Pneumococcal Polysaccharide-23 03/20/2010  . Td 10/17/2012   Last Colon -  Last MGM -  Past Surgical History:  Procedure Laterality Date  . ABDOMINAL HYSTERECTOMY    . bladder tac    . BREAST LUMPECTOMY  2004   left lump-snbx  . BUNIONECTOMY     bilateral  . COLONOSCOPY  2009   several  . CYSTOCELE REPAIR  2009  . EYE SURGERY Bilateral    Cataract  removal  . KNEE ARTHROSCOPY Left 01/17/2013   Procedure: ARTHROSCOPY LEFT KNEE;  Surgeon: Hessie Dibble, MD;  Location: [facility];  Service: Orthopedics;  Laterality: Left;  partial medial and partial lateral chondroplasty and removal loose body  . right shoulder  2003   bone spur removed-rcr  . TONSILLECTOMY    . TOTAL KNEE ARTHROPLASTY Right 11/26/2015   Procedure: TOTAL KNEE ARTHROPLASTY;  Surgeon: Melrose Nakayama, MD;  Location: [facility];  Service: Orthopedics;  Laterality: Right;  . WRIST FRACTURE SURGERY  2003   left-lipoma   Family History  Problem Relation Age of Onset  . Breast cancer Sister        x 2  . Cancer Sister 72       breast  . Breast cancer Sister   . Cancer Sister 61       breast cancer  . Colon cancer Son   . Heart disease Mother        died at age 98   Social History   Tobacco Use  . Smoking status: Never Smoker  . Smokeless tobacco: Never Used  Substance Use Topics  . Alcohol use: Yes    Comment: ocassionally  . Drug use: No    ROS Constitutional: Denies fever, chills, weight loss/gain, headaches, insomnia,  night sweats, and change in appetite. Does c/o fatigue. Eyes: Denies redness, blurred vision, diplopia, discharge, itchy, watery eyes.  ENT: Denies discharge, congestion, post nasal drip, epistaxis, sore throat, earache, hearing loss, dental pain, Tinnitus, Vertigo, Sinus pain, snoring.  Cardio: Denies chest pain, palpitations, irregular heartbeat, syncope, dyspnea, diaphoresis, orthopnea, PND, claudication, edema Respiratory: denies cough, dyspnea, DOE, pleurisy, hoarseness, laryngitis, wheezing.  Gastrointestinal: Denies dysphagia, heartburn, reflux, water brash, pain, cramps, nausea, vomiting, bloating, diarrhea, constipation, hematemesis, melena, hematochezia, jaundice, hemorrhoids Genitourinary: Denies dysuria, frequency, urgency, nocturia, hesitancy, discharge, hematuria, flank pain Breast: Breast lumps, nipple discharge,  bleeding.  Musculoskeletal: Denies arthralgia, myalgia, stiffness, Jt. Swelling, pain, limp, and strain/sprain. Denies falls. Skin: Denies puritis, rash, hives, warts, acne, eczema, changing in skin lesion Neuro: No weakness, tremor, incoordination, spasms, paresthesia, pain Psychiatric: Denies confusion, memory loss, sensory loss. Denies Depression. Endocrine: Denies change in weight, skin, hair change, nocturia, and paresthesia, diabetic polys, visual blurring, hyper / hypo glycemic episodes.  Heme/Lymph: No excessive bleeding, bruising, enlarged lymph nodes.  Physical Exam  BP 126/64   Pulse (!) 56   Temp 97.6 F (36.4 C)   Resp 16   Ht 5' 4.5" (1.638 m)   Wt 140 lb 12.8 oz (63.9 kg)   BMI 23.80 kg/m   General Appearance: Well nourished, well groomed and in no apparent distress.  Eyes: PERRLA, EOMs, conjunctiva no swelling or erythema, normal fundi and vessels. Sinuses: No frontal/maxillary tenderness ENT/Mouth: EACs patent / TMs  nl. Nares clear without erythema, swelling,  mucoid exudates. Oral hygiene is good. No erythema, swelling, or exudate. Tongue normal, non-obstructing. Tonsils not swollen or erythematous. Hearing normal.  Neck: Supple, thyroid not palpable. No bruits, nodes or JVD. Respiratory: Respiratory effort normal.  BS equal and clear bilateral without rales, rhonci, wheezing or stridor. Cardio: Heart sounds are normal with regular rate and rhythm and no murmurs, rubs or gallops. Peripheral pulses are normal and equal bilaterally without edema. No aortic or femoral bruits. Chest: symmetric with normal excursions and percussion. Breasts: Symmetric, without lumps, nipple discharge, retractions, or fibrocystic changes.  Abdomen: Flat, soft with bowel sounds active. Nontender, no guarding, rebound, hernias, masses, or organomegaly.  Lymphatics: Non tender without lymphadenopathy.  Musculoskeletal: Full ROM all peripheral extremities, joint stability, 5/5 strength, and  normal gait. Skin: Warm and dry without rashes, lesions, cyanosis, clubbing or  ecchymosis.  Neuro: Cranial nerves intact, reflexes equal bilaterally. Normal muscle tone, no cerebellar symptoms. Sensation intact.  Pysch: Alert and oriented X 3, normal affect, Insight and Judgment appropriate.   Assessment and Plan  1. Labile hypertension  - EKG 12-Lead - Urinalysis, Routine w reflex microscopic - Microalbumin / creatinine urine ratio - CBC with Differential/Platelet - COMPLETE METABOLIC PANEL WITH GFR - Magnesium - TSH  2. Hyperlipidemia, mixed  - EKG 12-Lead - Lipid panel - TSH  3. Abnormal glucose  - EKG 12-Lead - Hemoglobin A1c - Insulin, random  4. Vitamin D deficiency  - VITAMIN D 25 Hydroxyl  5. Prediabetes  - EKG 12-Lead - Hemoglobin A1c - Insulin, random  6. Hypothyroidism  - TSH  7. Moderate episode of recurrent major depressive disorder (HCC)  - escitalopram (LEXAPRO) 10 MG tablet; Take 1 tablet daily for Anxiety & Depression  Dispense: 90 tablet; Refill: 3  8. Irritable bowel syndrome with diarrhea  - dicyclomine (BENTYL) 20 MG tablet; Take 1/2 to 1 tablet 3 x /day before meals for IBS  Dispense: 360 tablet; Refill: 3  9. Screening for colorectal cancer  - POC Hemoccult Bld/Stl  10. Screening for ischemic heart disease  - EKG 12-Lead  11. FHx: heart disease  - EKG 12-Lead  12. Medication management  - Urinalysis, Routine w reflex microscopic - Microalbumin / creatinine urine ratio - CBC with Differential/Platelet - COMPLETE METABOLIC PANEL WITH GFR - Magnesium - Lipid panel - TSH - Hemoglobin A1c - Insulin, random - VITAMIN D 25 Hydroxyl  13. Need for prophylactic vaccination and inoculation against influenza  - Flu vaccine HIGH DOSE PF (Fluzone High dose)           Patient was counseled in prudent diet to achieve/maintain BMI less than 25 for weight control, BP monitoring, regular exercise and medications. Discussed med's  effects and SE's. Screening labs and tests as requested with regular follow-up as recommended. Over 40 minutes of exam, counseling, chart review and high complex critical decision making was performed.

## 2017-12-01 ENCOUNTER — Encounter: Payer: Self-pay | Admitting: Internal Medicine

## 2017-12-01 ENCOUNTER — Ambulatory Visit (INDEPENDENT_AMBULATORY_CARE_PROVIDER_SITE_OTHER): Payer: Medicare Other | Admitting: Internal Medicine

## 2017-12-01 VITALS — BP 126/64 | HR 56 | Temp 97.6°F | Resp 16 | Ht 64.5 in | Wt 140.8 lb

## 2017-12-01 DIAGNOSIS — E559 Vitamin D deficiency, unspecified: Secondary | ICD-10-CM

## 2017-12-01 DIAGNOSIS — Z79899 Other long term (current) drug therapy: Secondary | ICD-10-CM | POA: Diagnosis not present

## 2017-12-01 DIAGNOSIS — Z136 Encounter for screening for cardiovascular disorders: Secondary | ICD-10-CM

## 2017-12-01 DIAGNOSIS — R7309 Other abnormal glucose: Secondary | ICD-10-CM

## 2017-12-01 DIAGNOSIS — R0989 Other specified symptoms and signs involving the circulatory and respiratory systems: Secondary | ICD-10-CM

## 2017-12-01 DIAGNOSIS — E782 Mixed hyperlipidemia: Secondary | ICD-10-CM | POA: Diagnosis not present

## 2017-12-01 DIAGNOSIS — Z23 Encounter for immunization: Secondary | ICD-10-CM

## 2017-12-01 DIAGNOSIS — Z8249 Family history of ischemic heart disease and other diseases of the circulatory system: Secondary | ICD-10-CM

## 2017-12-01 DIAGNOSIS — R7303 Prediabetes: Secondary | ICD-10-CM | POA: Diagnosis not present

## 2017-12-01 DIAGNOSIS — K580 Irritable bowel syndrome with diarrhea: Secondary | ICD-10-CM | POA: Diagnosis not present

## 2017-12-01 DIAGNOSIS — E039 Hypothyroidism, unspecified: Secondary | ICD-10-CM | POA: Diagnosis not present

## 2017-12-01 DIAGNOSIS — Z1212 Encounter for screening for malignant neoplasm of rectum: Secondary | ICD-10-CM

## 2017-12-01 DIAGNOSIS — F331 Major depressive disorder, recurrent, moderate: Secondary | ICD-10-CM

## 2017-12-01 DIAGNOSIS — Z1211 Encounter for screening for malignant neoplasm of colon: Secondary | ICD-10-CM

## 2017-12-01 MED ORDER — ASPIRIN 81 MG PO TBEC: DELAYED_RELEASE_TABLET | ORAL | Status: AC

## 2017-12-01 MED ORDER — ESCITALOPRAM OXALATE 10 MG PO TABS: ORAL_TABLET | ORAL | 3 refills | Status: DC

## 2017-12-01 MED ORDER — DICYCLOMINE HCL 20 MG PO TABS: ORAL_TABLET | ORAL | 3 refills | Status: DC

## 2017-12-01 NOTE — Patient Instructions (Signed)

## 2017-12-02 ENCOUNTER — Encounter: Payer: Self-pay | Admitting: *Deleted

## 2017-12-02 LAB — MICROALBUMIN / CREATININE URINE RATIO
CREATININE, URINE: 43 mg/dL (ref 20–275)
MICROALB UR: 0.4 mg/dL
Microalb Creat Ratio: 9 mcg/mg creat (ref <30)

## 2017-12-02 LAB — CBC WITH DIFFERENTIAL/PLATELET
Basophils Absolute: 29 cells/uL (ref 0–200)
Basophils Relative: 0.6 %
EOS ABS: 88 {cells}/uL (ref 15–500)
Eosinophils Relative: 1.8 %
HCT: 39.6 % (ref 35.0–45.0)
HEMOGLOBIN: 13.1 g/dL (ref 11.7–15.5)
Lymphs Abs: 1367 cells/uL (ref 850–3900)
MCH: 29.2 pg (ref 27.0–33.0)
MCHC: 33.1 g/dL (ref 32.0–36.0)
MCV: 88.4 fL (ref 80.0–100.0)
MPV: 10.2 fL (ref 7.5–12.5)
Monocytes Relative: 9.9 %
NEUTROS ABS: 2930 {cells}/uL (ref 1500–7800)
Neutrophils Relative %: 59.8 %
Platelets: 248 10*3/uL (ref 140–400)
RBC: 4.48 10*6/uL (ref 3.80–5.10)
RDW: 12.5 % (ref 11.0–15.0)
Total Lymphocyte: 27.9 %
WBC: 4.9 10*3/uL (ref 3.8–10.8)
WBCMIX: 485 {cells}/uL (ref 200–950)

## 2017-12-02 LAB — URINALYSIS, ROUTINE W REFLEX MICROSCOPIC
BILIRUBIN URINE: NEGATIVE
Glucose, UA: NEGATIVE
Hgb urine dipstick: NEGATIVE
KETONES UR: NEGATIVE
Leukocytes, UA: NEGATIVE
NITRITE: NEGATIVE
PH: 6.5 (ref 5.0–8.0)
Protein, ur: NEGATIVE
SPECIFIC GRAVITY, URINE: 1.01 (ref 1.001–1.03)

## 2017-12-02 LAB — VITAMIN D 25 HYDROXY (VIT D DEFICIENCY, FRACTURES): VIT D 25 HYDROXY: 95 ng/mL (ref 30–100)

## 2017-12-02 LAB — COMPLETE METABOLIC PANEL WITH GFR
AG Ratio: 2 (calc) (ref 1.0–2.5)
ALKALINE PHOSPHATASE (APISO): 71 U/L (ref 33–130)
ALT: 10 U/L (ref 6–29)
AST: 17 U/L (ref 10–35)
Albumin: 4.5 g/dL (ref 3.6–5.1)
BUN: 20 mg/dL (ref 7–25)
CALCIUM: 10.2 mg/dL (ref 8.6–10.4)
CO2: 26 mmol/L (ref 20–32)
CREATININE: 0.76 mg/dL (ref 0.60–0.88)
Chloride: 105 mmol/L (ref 98–110)
GFR, EST NON AFRICAN AMERICAN: 69 mL/min/{1.73_m2} (ref >=60)
GFR, Est African American: 80 mL/min/{1.73_m2} (ref >=60)
GLOBULIN: 2.2 g/dL (ref 1.9–3.7)
GLUCOSE: 89 mg/dL (ref 65–99)
Potassium: 4.1 mmol/L (ref 3.5–5.3)
SODIUM: 140 mmol/L (ref 135–146)
Total Bilirubin: 1.3 mg/dL — ABNORMAL HIGH (ref 0.2–1.2)
Total Protein: 6.7 g/dL (ref 6.1–8.1)

## 2017-12-02 LAB — HEMOGLOBIN A1C
Hgb A1c MFr Bld: 5.2 % of total Hgb (ref <5.7)
Mean Plasma Glucose: 103 (calc)
eAG (mmol/L): 5.7 (calc)

## 2017-12-02 LAB — MAGNESIUM: MAGNESIUM: 2.1 mg/dL (ref 1.5–2.5)

## 2017-12-02 LAB — LIPID PANEL
CHOL/HDL RATIO: 2.6 (calc) (ref <5.0)
CHOLESTEROL: 176 mg/dL (ref <200)
HDL: 68 mg/dL (ref >50)
LDL CHOLESTEROL (CALC): 85 mg/dL
Non-HDL Cholesterol (Calc): 108 mg/dL (calc) (ref <130)
Triglycerides: 130 mg/dL (ref <150)

## 2017-12-02 LAB — TSH: TSH: 1.7 m[IU]/L (ref 0.40–4.50)

## 2017-12-02 LAB — INSULIN, RANDOM: INSULIN: 4.9 u[IU]/mL (ref 2.0–19.6)

## 2017-12-14 ENCOUNTER — Other Ambulatory Visit: Payer: Self-pay

## 2017-12-14 DIAGNOSIS — Z1212 Encounter for screening for malignant neoplasm of rectum: Principal | ICD-10-CM

## 2017-12-14 DIAGNOSIS — Z1211 Encounter for screening for malignant neoplasm of colon: Secondary | ICD-10-CM

## 2017-12-14 LAB — POC HEMOCCULT BLD/STL (HOME/3-CARD/SCREEN)
Card #2 Fecal Occult Blod, POC: NEGATIVE
Card #3 Fecal Occult Blood, POC: NEGATIVE
Fecal Occult Blood, POC: NEGATIVE

## 2017-12-28 DIAGNOSIS — M1712 Unilateral primary osteoarthritis, left knee: Secondary | ICD-10-CM | POA: Diagnosis not present

## 2017-12-28 DIAGNOSIS — M79672 Pain in left foot: Secondary | ICD-10-CM | POA: Diagnosis not present

## 2018-01-24 DIAGNOSIS — M79672 Pain in left foot: Secondary | ICD-10-CM | POA: Diagnosis not present

## 2018-02-08 IMAGING — CR DG CHEST 2V
2 series · 2 of 2 positions shown · non-contrast
Comparison: 10/03/2009

CLINICAL DATA: Preop for total knee arthroplasty

EXAM:
CHEST  2 VIEW

[w chest pa]
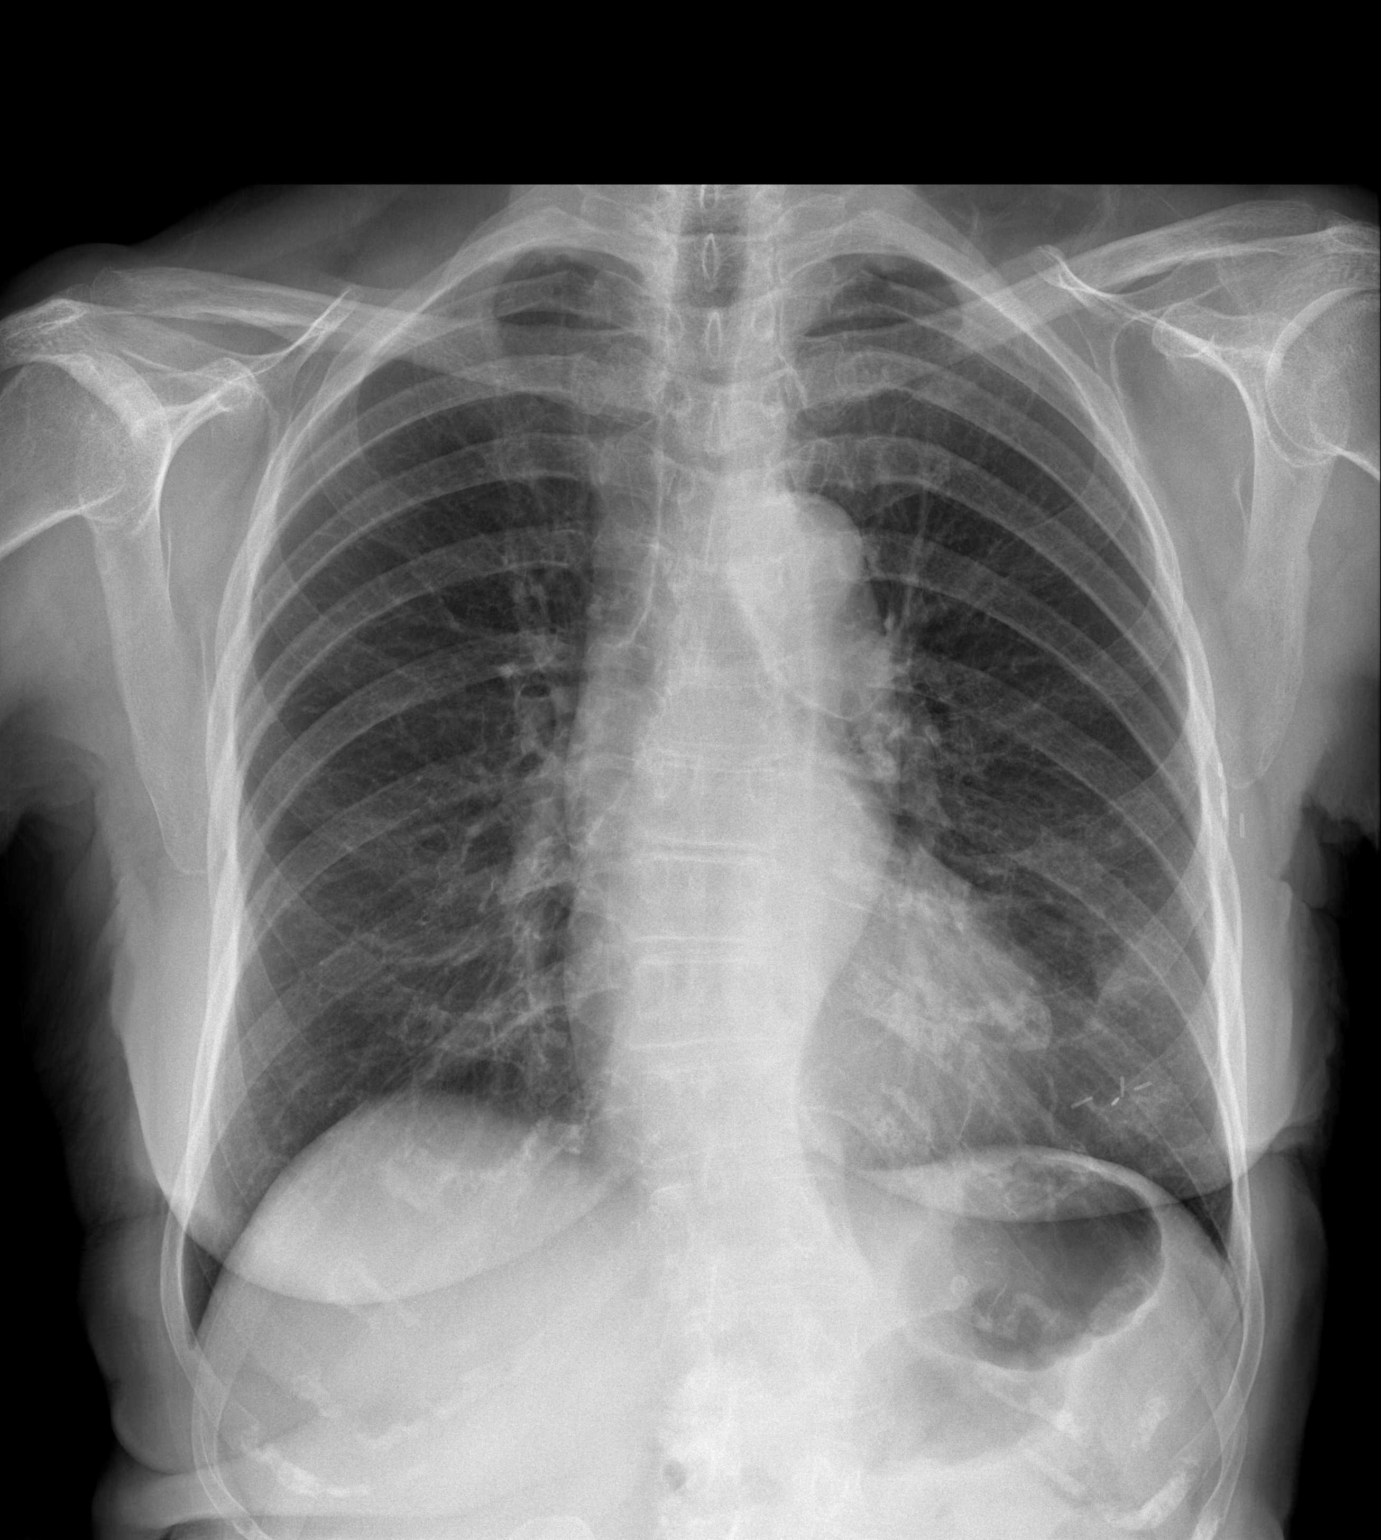

[w chest lat]
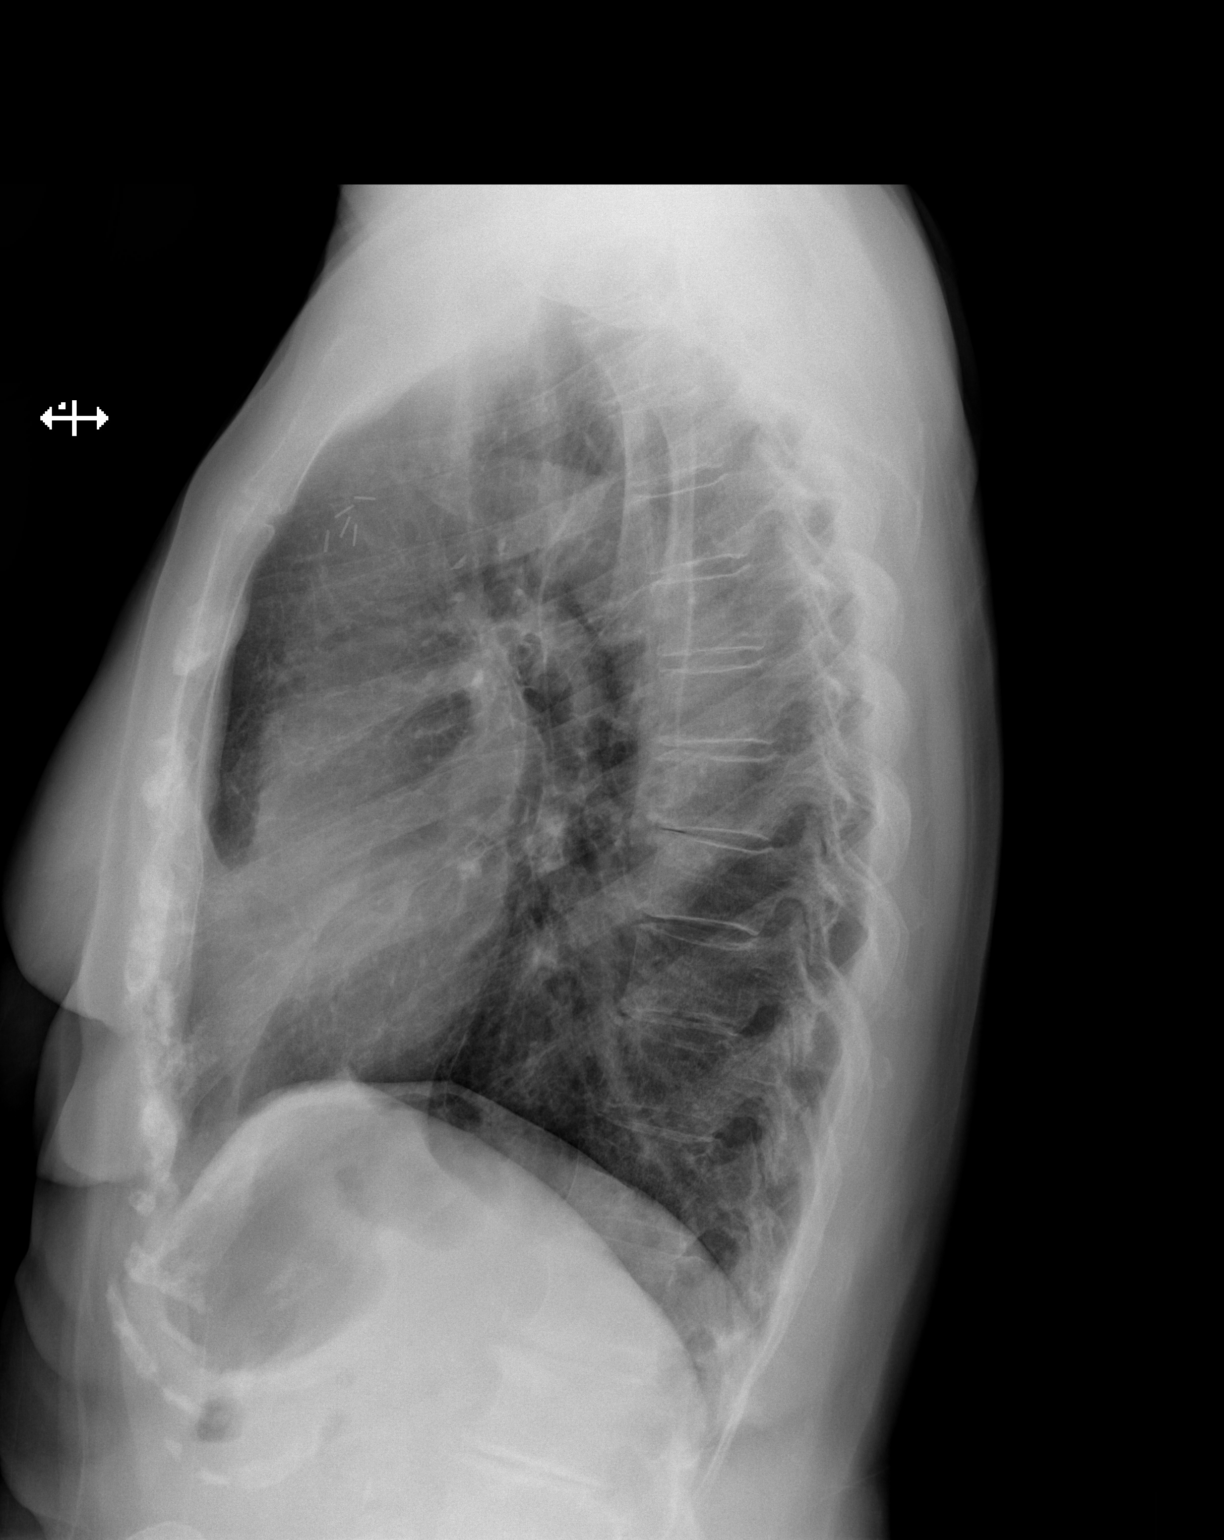

[2 of 2 positions shown; findings below may reference images not displayed]

FINDINGS: Cardiomediastinal silhouette is stable. No acute infiltrate or
pleural effusion. No pulmonary edema. Mild degenerative changes mid
thoracic spine.
IMPRESSION: No active cardiopulmonary disease.

## 2018-02-14 ENCOUNTER — Other Ambulatory Visit: Payer: Self-pay | Admitting: Internal Medicine

## 2018-02-14 MED ORDER — DIAZEPAM 5 MG PO TABS: ORAL_TABLET | ORAL | 0 refills | Status: DC

## 2018-03-05 NOTE — Progress Notes (Signed)
FOLLOW UP  Assessment and Plan:   Labile hypertension - continue medications, DASH diet, exercise and monitor at home. Call if greater than 130/80.  -     CBC with Differential/Platelet -     COMPLETE METABOLIC PANEL WITH GFR  Hypothyroidism, unspecified type Hypothyroidism-check TSH level, continue medications the same, reminded to take on an empty stomach 30-71mins before food.  -     TSH  Mixed hyperlipidemia check lipids decrease fatty foods increase activity.  -     Lipid panel  Temporal pain + tenderness right temporal Normal nuero Will check sed rate rule out TA but no visual changes, weight loss, etc. ? Tmj, information given versus from hearing aid -     Sedimentation rate -     predniSONE (DELTASONE) 20 MG tablet; 2 tablets daily for 3 days, 1 tablet daily for 4 days.  LAD (lymphadenopathy) of right cervical region Patient has some cold symptoms will send in prednisone Get [place]- no night sweats, weight loss etc -     US Soft Tissue Head/Neck; Future -     DG Chest 2 View; Future -     predniSONE (DELTASONE) 20 MG tablet; 2 tablets daily for 3 days, 1 tablet daily for 4 days.      Continue diet and meds as discussed. Further disposition pending results of labs. Discussed med's effects and SE's.   Over 30 minutes of exam, counseling, chart review, and critical decision making was performed.   Future Appointments  Date Time Provider [place]  06/14/2018 11:00 AM Unk Pinto, MD [facility] None  09/08/2018 11:15 AM Liane Comber, NP [facility] None  12/19/2018 11:00 AM Unk Pinto, MD [facility] None   ----------------------------------------------------------------------------------------------------------------------  HPI 85 y.o. female  presents for 3 month follow up on labile hypertension, cholesterol,  GAD/insomina and vitamin D deficiency.   She states in the fall and winter she gets right sided neck gland swelling and states she has  not been feeling well. No night sweats, no weight loss, no fever, chills.  She has sinus drainage, no sore throat. She will take prednisone occ that will help.   She wears hearing aid on right ear, will have right temporal pain occ, no pain with brushing teeth.   BMI is Body mass index is 23.96 kg/m., she has been working on diet and exercise. Wt Readings from Last 3 Encounters:  03/07/18 141 lb 12.8 oz (64.3 kg)  12/01/17 140 lb 12.8 oz (63.9 kg)  08/27/17 140 lb (63.5 kg)   she has a diagnosis of anxiety and insomnia and is currently on longstanding regimen of PRN valium 5 mg, reports symptoms are well controlled on current regimen; she reports very occasional use - 1-2 times per week.    Today their BP is BP: 122/68  She does not workout. She denies chest pain, shortness of breath, dizziness.   She is on cholesterol medication and denies myalgias. Her cholesterol is at goal. The cholesterol last visit was:   Lab Results  Component Value Date   CHOL 176 12/01/2017   HDL 68 12/01/2017   LDLCALC 85 12/01/2017   TRIG 130 12/01/2017   CHOLHDL 2.6 12/01/2017    Last A1C in the office was:  Lab Results  Component Value Date   HGBA1C 5.2 12/01/2017   She is on thyroid medication, she is on 143mcg one day a week and 1/2 a pill the other days. Her medication was not changed last visit.   Lab Results  Component Value Date   TSH 1.70 12/01/2017   Patient is on Vitamin D supplement but remained below goal at the last visit:    Lab Results  Component Value Date   VD25OH 95 12/01/2017        Current Medications:  Current Outpatient Medications on File Prior to Visit  Medication Sig  . aspirin (ASPIRIN 81) 81 MG EC tablet Take 1 tablet daily  . Cholecalciferol (VITAMIN D PO) Take 5,000 Units by mouth 2 (two) times daily.  . diazepam (VALIUM) 5 MG tablet Take 1/2 to 1 tablet 2 to 3 x /day ONLY if needed for Anxiety Attack  . dicyclomine (BENTYL) 20 MG tablet Take 1/2 to 1 tablet 3  x /day before meals for IBS  . levothyroxine (SYNTHROID) 150 MCG tablet Take 1 tablet by mouth  daily  . Probiotic Product (PROBIOTIC PO) Take 1 tablet by mouth daily.  . simvastatin (ZOCOR) 20 MG tablet Take 1 tablet (20 mg total) by mouth at bedtime.   No current facility-administered medications on file prior to visit.      Allergies:  Allergies  Allergen Reactions  . Ciprofloxacin   . Epinephrine Other (See Comments)    Heart raced   . Etodolac Hives  . Synephrine     Palpitations   . Flagyl [Metronidazole Hcl] Rash     Medical History:  Past Medical History:  Diagnosis Date  . Anxiety   . Breast cancer ([place])    left  . Colon polyp 2009   BENIGN POLYPOID  . Diverticulosis of colon (without mention of hemorrhage) 2009  . DJD (degenerative joint disease)   . Family history of malignant neoplasm of gastrointestinal tract 05/06/2011  . HOH (hard of hearing)    Right ear  . Hypercholesteremia   . Hypothyroidism   . IBS (irritable bowel syndrome)   . Internal hemorrhoids without mention of complication   . Primary osteoarthritis of right knee 11/26/2015   S/p TKA  . Sinus drainage   . Vitiligo   . Wears dentures    top  . Wears glasses    Family history- Reviewed and unchanged Social history- Reviewed and unchanged   Review of Systems:  Review of Systems  Constitutional: Negative for chills, diaphoresis, fever, malaise/fatigue and weight loss.  HENT: Positive for congestion and ear pain. Negative for ear discharge, hearing loss, nosebleeds, sinus pain, sore throat and tinnitus.   Eyes: Negative for blurred vision, double vision, photophobia, pain, discharge and redness.  Respiratory: Negative for cough, hemoptysis, sputum production, shortness of breath, wheezing and stridor.   Cardiovascular: Negative for chest pain, palpitations, orthopnea, claudication, leg swelling and PND.  Gastrointestinal: Negative for abdominal pain, blood in stool, constipation, diarrhea,  heartburn, melena, nausea and vomiting.  Genitourinary: Negative.   Musculoskeletal: Negative for joint pain and myalgias.  Skin: Negative for itching and rash.  Neurological: Negative for dizziness, tingling, sensory change, weakness and headaches.  Endo/Heme/Allergies: Negative for environmental allergies and polydipsia. Does not bruise/bleed easily.  Psychiatric/Behavioral: The patient is nervous/anxious (Intermittent, mild) and has insomnia (Intermittent, associated with anxiety - currently well controlled).   All other systems reviewed and are negative.   Physical Exam: BP 122/68   Pulse 61   Temp (!) 97.4 F (36.3 C)   Ht 5' 4.5" (1.638 m)   Wt 141 lb 12.8 oz (64.3 kg)   SpO2 98%   BMI 23.96 kg/m  Wt Readings from Last 3 Encounters:  03/07/18 141 lb 12.8 oz (  64.3 kg)  12/01/17 140 lb 12.8 oz (63.9 kg)  08/27/17 140 lb (63.5 kg)   General Appearance: Well nourished, in no apparent distress. Eyes: PERRLA, EOMs, conjunctiva no swelling or erythema Sinuses: No Frontal/maxillary tenderness ENT/Mouth: Ext aud canals clear, TMs without erythema, bulging. No erythema, swelling, or exudate on post pharynx.  Tonsils not swollen or erythematous. Hearing normal.  Neck: Supple, thyroid normal.  Respiratory: Respiratory effort normal, BS equal bilaterally without rales, rhonchi, wheezing or stridor.  Cardio: RRR with no MRGs. Brisk peripheral pulses without edema.  Abdomen: Soft, + BS.  Non tender, no guarding, rebound, hernias, masses. Lymphatics: right posterior and anterior cervical lymphadenopathy, mildly tender, no other lymphadenopathy appreciated.  Musculoskeletal: Full ROM, 5/5 strength, Normal gait Skin: Warm, dry without rashes, lesions, ecchymosis.  Neuro: Cranial nerves intact. No cerebellar symptoms.  Psych: Awake and oriented X 3, normal affect, Insight and Judgment appropriate.    Vicie Mutters, PA-C 11:39 AM [facility]

## 2018-03-07 ENCOUNTER — Ambulatory Visit (INDEPENDENT_AMBULATORY_CARE_PROVIDER_SITE_OTHER): Payer: Medicare Other | Admitting: Physician Assistant

## 2018-03-07 ENCOUNTER — Encounter: Payer: Self-pay | Admitting: Physician Assistant

## 2018-03-07 VITALS — BP 122/68 | HR 61 | Temp 97.4°F | Ht 64.5 in | Wt 141.8 lb

## 2018-03-07 DIAGNOSIS — E039 Hypothyroidism, unspecified: Secondary | ICD-10-CM

## 2018-03-07 DIAGNOSIS — R590 Localized enlarged lymph nodes: Secondary | ICD-10-CM

## 2018-03-07 DIAGNOSIS — R51 Headache: Secondary | ICD-10-CM | POA: Diagnosis not present

## 2018-03-07 DIAGNOSIS — R0989 Other specified symptoms and signs involving the circulatory and respiratory systems: Secondary | ICD-10-CM | POA: Diagnosis not present

## 2018-03-07 DIAGNOSIS — Z79899 Other long term (current) drug therapy: Secondary | ICD-10-CM | POA: Diagnosis not present

## 2018-03-07 DIAGNOSIS — E782 Mixed hyperlipidemia: Secondary | ICD-10-CM | POA: Diagnosis not present

## 2018-03-07 DIAGNOSIS — R519 Headache, unspecified: Secondary | ICD-10-CM

## 2018-03-07 MED ORDER — PREDNISONE 20 MG PO TABS: ORAL_TABLET | ORAL | 1 refills | Status: DC

## 2018-03-07 NOTE — Patient Instructions (Signed)
What is the TMJ? The temporomandibular (tem-PUH-ro-man-DIB-yoo-ler) joint, or the TMJ, connects the upper and lower jawbones. This joint allows the jaw to open wide and move back and forth when you chew, talk, or yawn.There are also several muscles that help this joint move. There can be muscle tightness and pain in the muscle that can cause several symptoms.  What causes TMJ pain? There are many causes of TMJ pain. Repeated chewing (for example, chewing gum) and clenching your teeth can cause pain in the joint. Some TMJ pain has no obvious cause. What can I do to ease the pain? There are many things you can do to help your pain get better. When you have pain:  Eat soft foods and stay away from chewy foods (for example, taffy) Try to use both sides of your mouth to chew Don't chew gum Massage Don't open your mouth wide (for example, during yawning or singing) Don't bite your cheeks or fingernails Lower your amount of stress and worry Applying a warm, damp washcloth to the joint may help. Over-the-counter pain medicines such as ibuprofen (one brand: Advil) or acetaminophen (one brand: Tylenol) might also help. Do not use these medicines if you are allergic to them or if your doctor told you not to use them. How can I stop the pain from coming back? When your pain is better, you can do these exercises to make your muscles stronger and to keep the pain from coming back:  Resisted mouth opening: Place your thumb or two fingers under your chin and open your mouth slowly, pushing up lightly on your chin with your thumb. Hold for three to six seconds. Close your mouth slowly. Resisted mouth closing: Place your thumbs under your chin and your two index fingers on the ridge between your mouth and the bottom of your chin. Push down lightly on your chin as you close your mouth. Tongue up: Slowly open and close your mouth while keeping the tongue touching the roof of the mouth. Side-to-side jaw movement:  Place an object about one fourth of an inch thick (for example, two tongue depressors) between your front teeth. Slowly move your jaw from side to side. Increase the thickness of the object as the exercise becomes easier Forward jaw movement: Place an object about one fourth of an inch thick between your front teeth and move the bottom jaw forward so that the bottom teeth are in front of the top teeth. Increase the thickness of the object as the exercise becomes easier. These exercises should not be painful. If it hurts to do these exercises, stop doing them and talk to your family doctor.   Silent reflux: Not all heartburn burns...Marland KitchenMarland KitchenMarland Kitchen  What is LPR? Laryngopharyngeal reflux (LPR) or silent reflux is a condition in which acid that is made in the stomach travels up the esophagus (swallowing tube) and gets to the throat. Not everyone with reflux has a lot of heartburn or indigestion. In fact, many people with LPR never have heartburn. This is why LPR is called SILENT REFLUX, and the terms "Silent reflux" and "LPR" are often used interchangeably. Because LPR is silent, it is sometimes difficult to diagnose.  How can you tell if you have LPR?  Marland Kitchen Chronic hoarseness- Some people have hoarseness that comes and goes . throat clearing  . Cough . It can cause shortness of breath and cause asthma like symptoms. Marland Kitchen a feeling of a lump in the throat  . difficulty swallowing . a problem with too  much nose and throat drainage.  . Some people will feel their esophagus spasm which feels like their heart beating hard and fast, this will usually be after a meal, at rest, or lying down at night.    How do I treat this? Treatment for LPR should be individualized, and your doctor will suggest the best treatment for you. Generally there are several treatments for LPR: . changing habits and diet to reduce reflux,  . medications to reduce stomach acid, and  . surgery to prevent reflux. Most people with LPR need to  modify how and when they eat, as well as take some medication, to get well. Sometimes, nonprescription liquid antacids, such as Maalox, Gelucil and Mylanta are recommended. When used, these antacids should be taken four times each day - one tablespoon one hour after each meal and before bedtime. Dietary and lifestyle changes alone are not often enough to control LPR - medications that reduce stomach acid are also usually needed. These must be prescribed by our doctor.   TIPS FOR REDUCING REFLUX AND LPR Control your LIFE-STYLE and your DIET! Marland Kitchen If you use tobacco, QUIT.  Marland Kitchen Smoking makes you reflux. After every cigarette you have some LPR.  . Don't wear clothing that is too tight, especially around the waist (trousers, corsets, belts).  . Do not lie down just after eating...in fact, do not eat within three hours of bedtime.  . You should be on a low-fat diet.  . Limit your intake of red meat.  . Limit your intake of butter.  Marland Kitchen Avoid fried foods.  . Avoid chocolate  . Avoid cheese.  Marland Kitchen Avoid eggs. Marland Kitchen Specifically avoid caffeine (especially coffee and tea), soda pop (especially cola) and mints.  . Avoid alcoholic beverages, particularly in the evening.  WOMEN AND HEART ATTACKS  Being a woman you may not have the typical symptoms of a heart attack.  You may not have any pain OR you may have atypical pain such as jaw pain, upper back pain, arm pain, "my bra feels to tight" and you will often have symptoms with it like below.  Symptoms for a heart attack will likely occur when you exert your self or exercise and include: Shortness of breath Sweating Nausea Dizziness Fast or irregular heart beats Fatigue   It makes me feel better if my patients get their heart rate up with exercise once or twice a week and pay close attention to your body. If there is ANY change in your exercise capacity or if you have symptoms above, please STOP and call 911 or call to come to the office.   Here is some  information to help you keep your heart healthy: Move it! - Aim for 30 mins of activity every day. Take it slowly at first. Talk to [place] before starting any new exercise program.   Lose it.  -Body Mass Index (BMI) can indicate if you need to lose weight. A healthy range is 18.5-24.9. For a BMI calculator, go to [employer]  Waist Management -Excess abdominal fat is a risk factor for heart disease, diabetes, asthma, stroke and more. Ideal waist circumference is less than 35" for women and less than 40" for men.   Eat Right -focus on fruits, vegetables, whole grains, and meals you make yourself. Avoid foods with trans fat and high sugar/sodium content.   Snooze or Snore? - Loud snoring can be a sign of sleep apnea, a significant risk factor for high blood pressure, heart attach, stroke,  and heart arrhythmias.  Kick the habit -Quit Smoking! Avoid second hand smoke. A single cigarette raises your blood pressure for 20 mins and increases the risk of heart attack and stroke for the next 24 hours.   Are Aspirin and Supplements right for you? -Add ENTERIC COATED low dose 81 mg Aspirin daily OR can do every other day if you have easy bruising to protect your heart and head. As well as to reduce risk of Colon Cancer by 20 %, Skin Cancer by 26 % , Melanoma by 46% and Pancreatic cancer by 60%  Say "No to Stress -There may be little you can do about problems that cause stress. However, techniques such as long walks, meditation, and exercise can help you manage it.   Start Now! - Make changes one at a time and set reasonable goals to increase your likelihood of success.

## 2018-03-08 LAB — COMPLETE METABOLIC PANEL WITH GFR
AG Ratio: 2.4 (calc) (ref 1.0–2.5)
ALBUMIN MSPROF: 4.4 g/dL (ref 3.6–5.1)
ALT: 12 U/L (ref 6–29)
AST: 17 U/L (ref 10–35)
Alkaline phosphatase (APISO): 67 U/L (ref 33–130)
BUN: 20 mg/dL (ref 7–25)
CO2: 28 mmol/L (ref 20–32)
CREATININE: 0.81 mg/dL (ref 0.60–0.88)
Calcium: 10 mg/dL (ref 8.6–10.4)
Chloride: 106 mmol/L (ref 98–110)
GFR, EST AFRICAN AMERICAN: 74 mL/min/{1.73_m2} (ref >=60)
GFR, Est Non African American: 64 mL/min/{1.73_m2} (ref >=60)
GLUCOSE: 85 mg/dL (ref 65–99)
Globulin: 1.8 g/dL (calc) — ABNORMAL LOW (ref 1.9–3.7)
Potassium: 4.4 mmol/L (ref 3.5–5.3)
Sodium: 142 mmol/L (ref 135–146)
TOTAL PROTEIN: 6.2 g/dL (ref 6.1–8.1)
Total Bilirubin: 1 mg/dL (ref 0.2–1.2)

## 2018-03-08 LAB — CBC WITH DIFFERENTIAL/PLATELET
Absolute Monocytes: 540 cells/uL (ref 200–950)
BASOS ABS: 30 {cells}/uL (ref 0–200)
Basophils Relative: 0.5 %
EOS PCT: 2.8 %
Eosinophils Absolute: 168 cells/uL (ref 15–500)
HCT: 40.5 % (ref 35.0–45.0)
HEMOGLOBIN: 13.8 g/dL (ref 11.7–15.5)
Lymphs Abs: 1428 cells/uL (ref 850–3900)
MCH: 29.9 pg (ref 27.0–33.0)
MCHC: 34.1 g/dL (ref 32.0–36.0)
MCV: 87.9 fL (ref 80.0–100.0)
MONOS PCT: 9 %
MPV: 10.1 fL (ref 7.5–12.5)
NEUTROS ABS: 3834 {cells}/uL (ref 1500–7800)
Neutrophils Relative %: 63.9 %
Platelets: 266 10*3/uL (ref 140–400)
RBC: 4.61 10*6/uL (ref 3.80–5.10)
RDW: 12.4 % (ref 11.0–15.0)
Total Lymphocyte: 23.8 %
WBC: 6 10*3/uL (ref 3.8–10.8)

## 2018-03-08 LAB — LIPID PANEL
CHOLESTEROL: 180 mg/dL (ref <200)
HDL: 63 mg/dL (ref >50)
LDL Cholesterol (Calc): 93 mg/dL (calc)
Non-HDL Cholesterol (Calc): 117 mg/dL (calc) (ref <130)
Total CHOL/HDL Ratio: 2.9 (calc) (ref <5.0)
Triglycerides: 137 mg/dL (ref <150)

## 2018-03-08 LAB — TSH: TSH: 1.61 mIU/L (ref 0.40–4.50)

## 2018-03-08 LAB — SEDIMENTATION RATE: Sed Rate: 2 mm/h (ref 0–30)

## 2018-03-24 ENCOUNTER — Ambulatory Visit
Admission: RE | Admit: 2018-03-24 | Discharge: 2018-03-24 | Disposition: A | Discharge status: Home / Self Care | Payer: Medicare Other | Source: Ambulatory Visit | Attending: Physician Assistant | Admitting: Physician Assistant

## 2018-03-24 DIAGNOSIS — R590 Localized enlarged lymph nodes: Secondary | ICD-10-CM

## 2018-03-24 DIAGNOSIS — R599 Enlarged lymph nodes, unspecified: Secondary | ICD-10-CM | POA: Diagnosis not present

## 2018-03-24 DIAGNOSIS — R0602 Shortness of breath: Secondary | ICD-10-CM | POA: Diagnosis not present

## 2018-04-12 ENCOUNTER — Other Ambulatory Visit: Payer: Self-pay | Admitting: Internal Medicine

## 2018-04-12 DIAGNOSIS — Z1231 Encounter for screening mammogram for malignant neoplasm of breast: Secondary | ICD-10-CM

## 2018-04-14 ENCOUNTER — Telehealth: Payer: Self-pay

## 2018-04-14 NOTE — Telephone Encounter (Signed)
-----   Message from Vicie Mutters, [place] sent at 04/13/2018  5:49 AM EST ----- Regarding: RE: Caryl Ada It is really up to her but if she wants she can cancel the one scheduled in march and at her next OV in April we can discuss the pros and cons to see if she would want to proceed.  Estill Bamberg ----- Message ----- From: Elenor Quinones, CMA Sent: 04/12/2018   2:38 PM EST To: Vicie Mutters, PA-C Subject: MGM                                            PATIENT wants to know if she should be getting MGM at her age?

## 2018-04-14 NOTE — Telephone Encounter (Signed)
Message left for patient to return my call.I will continue to try later.  

## 2018-04-15 ENCOUNTER — Other Ambulatory Visit: Payer: Self-pay | Admitting: Internal Medicine

## 2018-04-15 ENCOUNTER — Telehealth: Payer: Self-pay

## 2018-04-15 NOTE — Telephone Encounter (Signed)
-----   Message from Vicie Mutters, [place] sent at 04/13/2018  5:49 AM EST ----- Regarding: RE: Caryl Ada It is really up to her but if she wants she can cancel the one scheduled in march and at her next OV in April we can discuss the pros and cons to see if she would want to proceed.  Estill Bamberg ----- Message ----- From: Elenor Quinones, CMA Sent: 04/12/2018   2:38 PM EST To: Vicie Mutters, PA-C Subject: MGM                                            PATIENT wants to know if she should be getting MGM at her age?

## 2018-04-15 NOTE — Telephone Encounter (Signed)
Patient has been informed & has already scheduled a MGM appt.

## 2018-05-16 ENCOUNTER — Other Ambulatory Visit: Payer: Self-pay

## 2018-05-16 MED ORDER — SIMVASTATIN 20 MG PO TABS: 20.0000 mg | ORAL_TABLET | Freq: Every day | ORAL | 1 refills | Status: DC

## 2018-05-24 ENCOUNTER — Ambulatory Visit: Payer: Self-pay

## 2018-06-12 ENCOUNTER — Other Ambulatory Visit: Payer: Self-pay | Admitting: Internal Medicine

## 2018-06-14 ENCOUNTER — Ambulatory Visit: Payer: Medicare Other | Admitting: Internal Medicine

## 2018-06-14 ENCOUNTER — Encounter: Payer: Self-pay | Admitting: Internal Medicine

## 2018-06-14 ENCOUNTER — Other Ambulatory Visit: Payer: Self-pay

## 2018-06-14 ENCOUNTER — Ambulatory Visit: Payer: Self-pay | Admitting: Internal Medicine

## 2018-06-14 VITALS — Wt 139.0 lb

## 2018-06-14 DIAGNOSIS — R7303 Prediabetes: Secondary | ICD-10-CM

## 2018-06-14 DIAGNOSIS — E782 Mixed hyperlipidemia: Secondary | ICD-10-CM

## 2018-06-14 DIAGNOSIS — R0989 Other specified symptoms and signs involving the circulatory and respiratory systems: Secondary | ICD-10-CM | POA: Diagnosis not present

## 2018-06-14 DIAGNOSIS — E039 Hypothyroidism, unspecified: Secondary | ICD-10-CM

## 2018-06-14 DIAGNOSIS — R7309 Other abnormal glucose: Secondary | ICD-10-CM

## 2018-06-14 DIAGNOSIS — E559 Vitamin D deficiency, unspecified: Secondary | ICD-10-CM | POA: Diagnosis not present

## 2018-06-14 NOTE — Progress Notes (Signed)
THIS ENCOUNTER IS A VIRTUAL VISIT DUE TO COVID-19 - PATIENT WAS NOT SEEN IN THE OFFICE.  PATIENT HAS CONSENTED TO VIRTUAL VISIT / TELEMEDICINE VISIT  This provider placed a call to Alexandria Davies using telephone, her appointment was changed to a virtual office visit to reduce the risk of exposure to the COVID-19 virus and to help Alexandria Davies remain healthy and safe. The virtual visit will also provide continuity of care. She verbalizes understanding.   Virtual Visit via telephone Note  I connected with patient on 06/14/18  by telephone.  I verified that I am speaking with the correct person using two identifiers.        I discussed the limitations of evaluation and management by telemedicine and the availability of in person appointments. The patient expressed understanding and agreed to proceed.  History of Present Illness:      This very nice 85 y.o. [name] presents for 6 month follow up with HTN, HLD, Pre-Diabetes and Vitamin D Deficiency.  She also has hx/o IBS-D which has been asymptomatic recently.        Patient is followed proactively for labile HTN & she reports BP has been controlled at home.  Patient has had no complaints of any cardiac type chest pain, palpitations, dyspnea / orthopnea / PND, dizziness, claudication, or dependent edema.      Hyperlipidemia is controlled with diet & Simvastatin. Patient denies myalgias or other med SE's. Last Lipids were at goal: Lab Results  Component Value Date   CHOL 180 03/07/2018   HDL 63 03/07/2018   LDLCALC 93 03/07/2018   TRIG 137 03/07/2018   CHOLHDL 2.9 03/07/2018   Wt Readings from Last 3 Encounters:  06/14/18 139 lb (63 kg)  03/07/18 141 lb 12.8 oz (64.3 kg)  12/01/17 140 lb 12.8 oz (63.9 kg)        Also, the patient is monitored for glucose intolerance and has had no symptoms of reactive hypoglycemia, diabetic polys, paresthesias or visual blurring.  Last A1c was Normal & at goal: Lab Results  Component Value Date   HGBA1C 5.2 12/01/2017       Patient has been on Thyroid Replacement circa 2000.       Further, the patient also has history of Vitamin D Deficiency ("37" / 2011)  and supplements vitamin D without any suspected side-effects. Last vitamin D was at goal: Lab Results  Component Value Date   VD25OH 95 12/01/2017   Current Outpatient Medications on File Prior to Visit  Medication Sig  . aspirin (ASPIRIN 81) 81 MG EC tablet Take 1 tablet daily  . Cholecalciferol (VITAMIN D PO) Take 5,000 Units by mouth 2 (two) times daily.  . diazepam (VALIUM) 5 MG tablet Take 1/2 to 1 tablet 2 to 3 x /day ONLY if needed for Anxiety Attack  . dicyclomine (BENTYL) 20 MG tablet Take 1/2 to 1 tablet 3 x /day before meals for IBS  . levothyroxine (SYNTHROID) 150 MCG tablet TAKE 1 TABLET BY MOUTH  DAILY  . Probiotic Product (PROBIOTIC PO) Take 1 tablet by mouth daily.  . simvastatin (ZOCOR) 20 MG tablet Take 1 tablet (20 mg total) by mouth at bedtime.   No current facility-administered medications on file prior to visit.    Allergies  Allergen Reactions  . Ciprofloxacin   . Epinephrine Other (See Comments)    Heart raced   . Etodolac Hives  . Synephrine     Palpitations   . Flagyl [Metronidazole  Hcl] Rash   PMHx:   Past Medical History:  Diagnosis Date  . Anxiety   . Breast cancer ([place])    left  . Colon polyp 2009   BENIGN POLYPOID  . Diverticulosis of colon (without mention of hemorrhage) 2009  . DJD (degenerative joint disease)   . Family history of malignant neoplasm of gastrointestinal tract 05/06/2011  . HOH (hard of hearing)    Right ear  . Hypercholesteremia   . Hypothyroidism   . IBS (irritable bowel syndrome)   . Internal hemorrhoids without mention of complication   . Primary osteoarthritis of right knee 11/26/2015   S/p TKA  . Sinus drainage   . Vitiligo   . Wears dentures    top  . Wears glasses    Immunization History  Administered Date(s) Administered  . Influenza Split  12/01/2010  . Influenza, High Dose Seasonal PF 11/12/2015, 11/23/2016, 12/01/2017  . Influenza-Unspecified 10/31/2012, 11/29/2014  . Pneumococcal Conjugate-13 10/18/2013  . Pneumococcal Polysaccharide-23 03/20/2010  . Td 10/17/2012   Past Surgical History:  Procedure Laterality Date  . ABDOMINAL HYSTERECTOMY    . bladder tac    . BREAST LUMPECTOMY  2004   left lump-snbx  . BUNIONECTOMY     bilateral  . COLONOSCOPY  2009   several  . CYSTOCELE REPAIR  2009  . EYE SURGERY Bilateral    Cataract removal  . KNEE ARTHROSCOPY Left 01/17/2013   Procedure: ARTHROSCOPY LEFT KNEE;  Surgeon: Hessie Dibble, MD;  Location: [facility];  Service: Orthopedics;  Laterality: Left;  partial medial and partial lateral chondroplasty and removal loose body  . right shoulder  2003   bone spur removed-rcr  . TONSILLECTOMY    . TOTAL KNEE ARTHROPLASTY Right 11/26/2015   Procedure: TOTAL KNEE ARTHROPLASTY;  Surgeon: Melrose Nakayama, MD;  Location: [facility];  Service: Orthopedics;  Laterality: Right;  . WRIST FRACTURE SURGERY  2003   left-lipoma   FHx:    Reviewed / unchanged  SHx:    Reviewed / unchanged   Systems Review:  Constitutional: Denies fever, chills, wt changes, headaches, insomnia, fatigue, night sweats, change in appetite. Eyes: Denies redness, blurred vision, diplopia, discharge, itchy, watery eyes.  ENT: Denies discharge, congestion, post nasal drip, epistaxis, sore throat, earache, hearing loss, dental pain, tinnitus, vertigo, sinus pain, snoring.  CV: Denies chest pain, palpitations, irregular heartbeat, syncope, dyspnea, diaphoresis, orthopnea, PND, claudication or edema. Respiratory: denies cough, dyspnea, DOE, pleurisy, hoarseness, laryngitis, wheezing.  Gastrointestinal: Denies dysphagia, odynophagia, heartburn, reflux, water brash, abdominal pain or cramps, nausea, vomiting, bloating, diarrhea, constipation, hematemesis, melena, hematochezia  or hemorrhoids.  Genitourinary: Denies dysuria, frequency, urgency, nocturia, hesitancy, discharge, hematuria or flank pain. Musculoskeletal: Denies arthralgias, myalgias, stiffness, jt. swelling, pain, limping or strain/sprain.  Skin: Denies pruritus, rash, hives, warts, acne, eczema or change in skin lesion(s). Neuro: No weakness, tremor, incoordination, spasms, paresthesia or pain. Psychiatric: Denies confusion, memory loss or sensory loss. Endo: Denies change in weight, skin or hair change.  Heme/Lymph: No excessive bleeding, bruising or enlarged lymph nodes.  Physical Exam  Wt 139 lb (63 kg)   BMI 23.49 kg/m   General : Well sounding patient in no apparent distress HEENT: no hoarseness, no cough for duration of visit Lungs: speaks in complete sentences, no audible wheezing, no apparent distress Neurological: alert, oriented x 3 Psychiatric: pleasant, judgement appropriate  Alert & oriented x 3.  Insight and judgement nl & appropriate. No ideations.  Assessment and Plan:  1. Labile hypertension  -  Continue medication, monitor blood pressure at home.  - Continue DASH diet.  Reminder to go to the ER if any CP,  SOB, nausea, dizziness, severe HA, changes vision/speech.  2. Hyperlipidemia, mixed  - Continue diet/meds, exercise,& lifestyle modifications.  - Continue monitor periodic cholesterol/liver & renal functions   3. Abnormal glucose  - Continue diet, exercise  - Lifestyle modifications.  - Monitor appropriate labs.  4. Vitamin D deficiency  - Continue supplementation.  5. Prediabetes  6. Hypothyroidism       Discussed  regular exercise, BP monitoring, weight control to achieve/maintain BMI less than 25 and discussed med and SE's. Deferred recommended labs  Due to Covid -19 pandemic. I discussed the assessment and treatment plan with the patient. The patient was provided an opportunity to ask questions and all were answered. The patient agreed with the plan and demonstrated an  understanding of the instructions.       I provided  24  minutes of non-face-to-face time during this encounter and over 64minutes of exam, counseling, chart review and  complex critical decision making was performed  Kirtland Bouchard, MD

## 2018-06-14 NOTE — Patient Instructions (Signed)

## 2018-06-22 ENCOUNTER — Ambulatory Visit: Payer: Self-pay

## 2018-07-18 DIAGNOSIS — M79672 Pain in left foot: Secondary | ICD-10-CM | POA: Diagnosis not present

## 2018-07-18 DIAGNOSIS — M1712 Unilateral primary osteoarthritis, left knee: Secondary | ICD-10-CM | POA: Diagnosis not present

## 2018-08-08 ENCOUNTER — Ambulatory Visit
Admission: RE | Admit: 2018-08-08 | Discharge: 2018-08-08 | Disposition: A | Discharge status: Home / Self Care | Payer: Medicare Other | Source: Ambulatory Visit | Attending: Internal Medicine | Admitting: Internal Medicine

## 2018-08-08 ENCOUNTER — Other Ambulatory Visit: Payer: Self-pay

## 2018-08-08 DIAGNOSIS — Z1231 Encounter for screening mammogram for malignant neoplasm of breast: Secondary | ICD-10-CM | POA: Diagnosis not present

## 2018-08-17 DIAGNOSIS — M1712 Unilateral primary osteoarthritis, left knee: Secondary | ICD-10-CM | POA: Diagnosis not present

## 2018-08-29 DIAGNOSIS — M1712 Unilateral primary osteoarthritis, left knee: Secondary | ICD-10-CM | POA: Diagnosis not present

## 2018-09-05 DIAGNOSIS — M1712 Unilateral primary osteoarthritis, left knee: Secondary | ICD-10-CM | POA: Diagnosis not present

## 2018-09-08 ENCOUNTER — Ambulatory Visit: Payer: Self-pay | Admitting: Adult Health

## 2018-09-12 NOTE — Progress Notes (Signed)
MEDICARE ANNUAL WELLNESS VISIT AND FOLLOW UP  Assessment:   Diagnoses and all orders for this visit:  Encounter for Medicare annual wellness exam  Labile hypertension At goal off of medications Monitor blood pressure at home; call if consistently over 130/80 Continue DASH diet.   Reminder to go to the ER if any CP, SOB, nausea, dizziness, severe HA, changes vision/speech, left arm numbness and tingling and jaw pain.  Internal hemorrhoids PRN medicaitons  Allergic rhinitis, unspecified seasonality, unspecified trigger Continue OTC allergy pills PRN  Diverticulosis Followed by GI  Polyp of colon, unspecified part of colon, unspecified type Followed by GI, ? No further colonoscopies due to age, hemoccult annually at CPE   Hypothyroidism, unspecified type continue medications the same pending lab results reminded to take on an empty stomach 30-25mins before food.  check TSH level  Osteoarthritis of multiple joints, unspecified osteoarthritis type Followed by ortho, s/p TKA   Vitamin D deficiency At goal at recent check; continue to recommend supplementation for goal of 70-100 Defer vitamin D level  Personal history of malignant neoplasm of breast Continue annual mammograms  Mixed hyperlipidemia Low risk hx, in light of age discussed d/c, she is in agreement  Continue low cholesterol diet and exercise.  Defer checking as would not pursue aggressively secondary to age  Medication management CBC, CMP/GFR, magnesium  Insomnia, unspecified type Doing well on rare valium use PRN  Generalized anxiety disorder Not currently well managed; not on daily agent, discussed SNRIs - will try low dose effexor XR Stress management techniques discussed, increase water, good sleep hygiene discussed, increase exercise, and increase veggies.   BMI 23.0-23.9, adult Continue to recommend diet heavy in fruits and veggies and low in animal meats, cheeses, and dairy products, appropriate  calorie intake Discuss exercise recommendations routinely Continue to monitor weight at each visit    Over 40 minutes of exam, counseling, chart review and critical decision making was performed Future Appointments  Date Time Provider [place]  12/19/2018 11:00 AM Unk Pinto, MD [facility] None     Plan:   During the course of the visit the patient was educated and counseled about appropriate screening and preventive services including:    Pneumococcal vaccine   Prevnar 13  Influenza vaccine  Td vaccine  Screening electrocardiogram  Bone densitometry screening  Colorectal cancer screening  Diabetes screening  Glaucoma screening  Nutrition counseling   Advanced directives: requested   Subjective:  Alexandria Davies is a 85 y.o. female who presents for Medicare Annual Wellness Visit and 3 month follow up.   she has a diagnosis of anxiety and has been on valium 5 mg TID PRN. she currently takes very rarely, takes 2-3 a month or so when she cannot sleep, reports she does have constant daytime anxiety and worrying since her son had colon cancer many years ago. She does not like taking valium as this makes her drowsy during the day. Not currently on a daily agent.  Follows with Dr. Norm Salt for osteoarthritis, has celebrex PRN.   BMI is Body mass index is 24.17 kg/m., she has been working on diet, exercise is somewhat limited due to joint pains recently, but generally active around her house and yard.  Wt Readings from Last 3 Encounters:  09/13/18 143 lb (64.9 kg)  06/14/18 139 lb (63 kg)  03/07/18 141 lb 12.8 oz (64.3 kg)   She does not currently check BP at home, today their BP is BP: (!) 144/84 She does not workout.  She denies chest pain, shortness of breath, dizziness.   She is on cholesterol medication (simvastatin 20 mg daily, discsussed stopping as low risk history and r/t age) and denies myalgias. Her cholesterol is at goal. The cholesterol last  visit was:   Lab Results  Component Value Date   CHOL 180 03/07/2018   HDL 63 03/07/2018   LDLCALC 93 03/07/2018   TRIG 137 03/07/2018   CHOLHDL 2.9 03/07/2018   She has been working on diet and exercise for glucose management, and denies foot ulcerations, increased appetite, nausea, paresthesia of the feet, polydipsia, polyuria, visual disturbances, vomiting and weight loss. Last A1C in the office was:  Lab Results  Component Value Date   HGBA1C 5.2 12/01/2017   She is on thyroid medication. Her medication was not changed last visit. Synthroid 1 tab (150 mcg on Wednesday, and 1/2 tab all other days).   Lab Results  Component Value Date   TSH 1.61 03/07/2018   Last GFR: Lab Results  Component Value Date   GFRNONAA 64 03/07/2018   Patient is on Vitamin D supplement.   Lab Results  Component Value Date   VD25OH 95 12/01/2017      Medication Review: Current Outpatient Medications on File Prior to Visit  Medication Sig Dispense Refill  . aspirin (ASPIRIN 81) 81 MG EC tablet Take 1 tablet daily    . celecoxib (CELEBREX) 200 MG capsule Take 200 mg by mouth as needed.    . Cholecalciferol (VITAMIN D PO) Take 5,000 Units by mouth 2 (two) times daily.    . diazepam (VALIUM) 5 MG tablet Take 1/2 to 1 tablet 2 to 3 x /day ONLY if needed for Anxiety Attack 60 tablet 0  . levothyroxine (SYNTHROID) 150 MCG tablet TAKE 1 TABLET BY MOUTH  DAILY 90 tablet 1  . Potassium Gluconate 595 MG CAPS Take by mouth daily.    . Probiotic Product (PROBIOTIC PO) Take 1 tablet by mouth daily.    Marland Kitchen dicyclomine (BENTYL) 20 MG tablet Take 1/2 to 1 tablet 3 x /day before meals for IBS (Patient not taking: Reported on 09/13/2018) 360 tablet 3   No current facility-administered medications on file prior to visit.     Allergies  Allergen Reactions  . Ciprofloxacin   . Epinephrine Other (See Comments)    Heart raced   . Etodolac Hives  . Synephrine     Palpitations   . Flagyl [Metronidazole Hcl] Rash     Current Problems (verified) Patient Active Problem List   Diagnosis Date Noted  . Insomnia 10/22/2014  . Generalized anxiety disorder 10/22/2014  . Labile hypertension 02/14/2014  . Mixed hyperlipidemia 02/14/2014  . Vitamin D deficiency 02/14/2014  . Medication management 02/14/2014  . Rhinitis, allergic 08/19/2011  . Diverticulosis 04/23/2011  . Colon polyps 04/23/2011  . PERSONAL HISTORY OF MALIGNANT NEOPLASM OF BREAST 12/07/2007  . Hypothyroidism 12/05/2007  . Osteoarthritis 12/05/2007  . Internal hemorrhoids 11/08/2002    Screening Tests Immunization History  Administered Date(s) Administered  . Influenza Split 12/01/2010  . Influenza, High Dose Seasonal PF 11/12/2015, 11/23/2016, 12/01/2017  . Influenza-Unspecified 10/31/2012, 11/29/2014  . Pneumococcal Conjugate-13 10/18/2013  . Pneumococcal Polysaccharide-23 03/20/2010  . Td 10/17/2012   Preventative care: Last colonoscopy: 2009 Mammogram:  08/2018 Bone Density:  04/2016 - T -1.5 - defer to next year   Prior vaccinations: TD or Tdap: 2014  Influenza: 2019  Pneumococcal: 2012 Prevnar13: 2015 Shingles/Zostavax: declines  Names of Other Physician/Practitioners you currently use: 1. [facility] here for primary care 2. Dr. Katy Fitch, eye doctor, last visit 2019, wears glasses 3. Dr. Randol Kern, dentist, last visit 07/2017   Patient Care Team: Unk Pinto, MD as PCP - General (Internal Medicine) Melrose Nakayama, MD as Consulting Physician (Orthopedic Surgery) Deneise Lever, MD as Consulting Physician (Pulmonary Disease) Inda Castle, MD (Inactive) as Consulting Physician (Gastroenterology) Carolan Clines, MD (Inactive) as Consulting Physician (Urology)  SURGICAL HISTORY She  has a past surgical history that includes Abdominal hysterectomy; bladder tac; Bunionectomy; right shoulder (2003); Wrist fracture surgery (2003); Cystocele repair (2009); Tonsillectomy;  Knee arthroscopy (Left, 01/17/2013); Colonoscopy (2009); Eye surgery (Bilateral); Total knee arthroplasty (Right, 11/26/2015); and Breast lumpectomy (2004). FAMILY HISTORY Her family history includes Breast cancer in her sister and sister; Cancer (age of onset: 34) in her sister; Cancer (age of onset: 54) in her sister; Colon cancer in her son; Heart disease in her mother. SOCIAL HISTORY She  reports that she has never smoked. She has never used smokeless tobacco. She reports current alcohol use. She reports that she does not use drugs.   MEDICARE WELLNESS OBJECTIVES: Physical activity: Current Exercise Habits: Home exercise routine, Type of exercise: walking, Time (Minutes): 30, Frequency (Times/Week): 3, Weekly Exercise (Minutes/Week): 90, Intensity: Mild, Exercise limited by: orthopedic condition(s) Cardiac risk factors: Cardiac Risk Factors include: advanced age (>66men, >6 women);dyslipidemia;hypertension Depression/mood screen:   Depression screen [facility] 2/9 09/13/2018  Decreased Interest 0  Down, Depressed, Hopeless 0  PHQ - 2 Score 0    ADLs:  In your present state of health, do you have any difficulty performing the following activities: 09/13/2018 06/14/2018  Hearing? N N  Comment has R sided hearing aid -  Vision? N N  Difficulty concentrating or making decisions? N N  Walking or climbing stairs? N N  Dressing or bathing? N N  Doing errands, shopping? N N  Some recent data might be hidden     Cognitive Testing  Alert? Yes  Normal Appearance?Yes  Oriented to person? Yes  Place? Yes   Time? Yes  Recall of three objects?  Yes  Can perform simple calculations? Yes  Displays appropriate judgment?Yes  Can read the correct time from a watch face?Yes  EOL planning: Does Patient Have a Medical Advance Directive?: Yes Type of Advance Directive: Healthcare Power of Attorney, Living will Does patient want to make changes to medical advance directive?: No - Patient declined Copy of  [place] in Chart?: No - copy requested  Review of Systems  Constitutional: Negative for malaise/fatigue and weight loss.  HENT: Negative for hearing loss and tinnitus.   Eyes: Negative for blurred vision and double vision.  Respiratory: Negative for cough, sputum production, shortness of breath and wheezing.   Cardiovascular: Negative for chest pain, palpitations, orthopnea, claudication, leg swelling and PND.  Gastrointestinal: Negative for abdominal pain, blood in stool, constipation, diarrhea, heartburn, melena, nausea and vomiting.  Genitourinary: Negative.   Musculoskeletal: Negative for falls, joint pain and myalgias.  Skin: Negative for rash.  Neurological: Negative for dizziness, tingling, sensory change, weakness and headaches.  Endo/Heme/Allergies: Negative for polydipsia.  Psychiatric/Behavioral: Negative.  Negative for depression, memory loss, substance abuse and suicidal ideas. The patient is not nervous/anxious and does not have insomnia.   All other systems reviewed and are negative.    Objective:     Today's Vitals   09/13/18 1129  BP: (!) 144/84  Pulse: 60  Temp: (!) 97.3 F (36.3 C)  SpO2: 97%  Weight: 143 lb (64.9 kg)  Height: 5' 4.5" (1.638 m)   Body mass index is 24.17 kg/m.  General appearance: alert, no distress, WD/WN, female HEENT: normocephalic, sclerae anicteric, TMs pearly, nares patent, no discharge or erythema, pharynx normal Oral cavity: MMM, no lesions Neck: supple, no lymphadenopathy, no thyromegaly, no masses Heart: RRR, normal S1, S2, no murmurs Lungs: CTA bilaterally, no wheezes, rhonchi, or rales Abdomen: +bs, soft, non tender, non distended, no masses, no hepatomegaly, no splenomegaly Musculoskeletal: nontender, no swelling, no obvious deformity Extremities: no edema, no cyanosis, no clubbing Pulses: 2+ symmetric, upper and lower extremities, normal cap refill Neurological: alert, oriented x 3, CN2-12 intact,  strength normal upper extremities and lower extremities, sensation normal throughout, DTRs 2+ throughout, no cerebellar signs, gait normal Psychiatric: normal affect, behavior normal, pleasant   Medicare Attestation I have personally reviewed: The patient's medical and social history Their use of alcohol, tobacco or illicit drugs Their current medications and supplements The patient's functional ability including ADLs,fall risks, home safety risks, cognitive, and hearing and visual impairment Diet and physical activities Evidence for depression or mood disorders  The patient's weight, height, BMI, and visual acuity have been recorded in the chart.  I have made referrals, counseling, and provided education to the patient based on review of the above and I have provided the patient with a written personalized care plan for preventive services.     Alexandria Ribas, NP   09/13/2018

## 2018-09-13 ENCOUNTER — Encounter: Payer: Self-pay | Admitting: Adult Health

## 2018-09-13 ENCOUNTER — Other Ambulatory Visit: Payer: Self-pay

## 2018-09-13 ENCOUNTER — Ambulatory Visit (INDEPENDENT_AMBULATORY_CARE_PROVIDER_SITE_OTHER): Payer: Medicare Other | Admitting: Adult Health

## 2018-09-13 VITALS — BP 144/84 | HR 60 | Temp 97.3°F | Ht 64.5 in | Wt 143.0 lb

## 2018-09-13 DIAGNOSIS — E039 Hypothyroidism, unspecified: Secondary | ICD-10-CM

## 2018-09-13 DIAGNOSIS — M159 Polyosteoarthritis, unspecified: Secondary | ICD-10-CM

## 2018-09-13 DIAGNOSIS — F411 Generalized anxiety disorder: Secondary | ICD-10-CM | POA: Diagnosis not present

## 2018-09-13 DIAGNOSIS — Z79899 Other long term (current) drug therapy: Secondary | ICD-10-CM

## 2018-09-13 DIAGNOSIS — Z853 Personal history of malignant neoplasm of breast: Secondary | ICD-10-CM | POA: Diagnosis not present

## 2018-09-13 DIAGNOSIS — Z0001 Encounter for general adult medical examination with abnormal findings: Secondary | ICD-10-CM | POA: Diagnosis not present

## 2018-09-13 DIAGNOSIS — K5790 Diverticulosis of intestine, part unspecified, without perforation or abscess without bleeding: Secondary | ICD-10-CM

## 2018-09-13 DIAGNOSIS — R6889 Other general symptoms and signs: Secondary | ICD-10-CM | POA: Diagnosis not present

## 2018-09-13 DIAGNOSIS — G4700 Insomnia, unspecified: Secondary | ICD-10-CM | POA: Diagnosis not present

## 2018-09-13 DIAGNOSIS — E782 Mixed hyperlipidemia: Secondary | ICD-10-CM

## 2018-09-13 DIAGNOSIS — E559 Vitamin D deficiency, unspecified: Secondary | ICD-10-CM | POA: Diagnosis not present

## 2018-09-13 DIAGNOSIS — K635 Polyp of colon: Secondary | ICD-10-CM | POA: Diagnosis not present

## 2018-09-13 DIAGNOSIS — Z6823 Body mass index (BMI) 23.0-23.9, adult: Secondary | ICD-10-CM

## 2018-09-13 DIAGNOSIS — Z0000 Encounter for general adult medical examination without abnormal findings: Secondary | ICD-10-CM

## 2018-09-13 DIAGNOSIS — K648 Other hemorrhoids: Secondary | ICD-10-CM

## 2018-09-13 DIAGNOSIS — R0989 Other specified symptoms and signs involving the circulatory and respiratory systems: Secondary | ICD-10-CM

## 2018-09-13 DIAGNOSIS — J309 Allergic rhinitis, unspecified: Secondary | ICD-10-CM | POA: Diagnosis not present

## 2018-09-13 MED ORDER — CLOTRIMAZOLE-BETAMETHASONE 1-0.05 % EX CREA: 1.0000 "application " | TOPICAL_CREAM | Freq: Two times a day (BID) | CUTANEOUS | 1 refills | Status: DC

## 2018-09-13 NOTE — Patient Instructions (Addendum)
Alexandria Davies , Thank you for taking time to come for your Medicare Wellness Visit. I appreciate your ongoing commitment to your health goals. Please review the following plan we discussed and let me know if I can assist you in the future.   These are the goals we discussed: Goals    . Exercise 150 min/wk Moderate Activity     Aim for 20-30 min daily       This is a list of the screening recommended for you and due dates:  Health Maintenance  Topic Date Due  . Flu Shot  10/01/2018  . Tetanus Vaccine  10/18/2022  . DEXA scan (bone density measurement)  Completed  . Pneumonia vaccines  Completed    Call to schedule an appointment with your dentist       Exercise Information for Aging Adults Staying physically active is important as you age. The four types of exercises that are best for older adults are endurance, strength, balance, and flexibility. Contact your health care provider before you start any exercise routine. Ask your health care provider what activities are safe for you. What are the risks? Risks associated with exercising include:  Overdoing it. This may lead to sore muscles or fatigue.  Falls.  Injuries.  Dehydration. How to do these exercises Endurance exercises Endurance (aerobic) exercises raise your breathing rate and heart rate. Increasing your endurance helps you to do everyday tasks and stay healthy. By improving the health of your body system that includes your heart, lungs, and blood vessels (circulatory system), you may also delay or prevent diseases such as heart disease, diabetes, and bone loss (osteoporosis). Types of endurance exercises include:  Sports.  Indoor activities, such as using gym equipment, doing water aerobics, or dancing.  Outdoor activities, such as biking or jogging.  Tasks around the house, such as gardening, yard work, and heavy household chores like cleaning.  Walking, such as hiking or walking around your neighborhood. When  doing endurance exercises, make sure you:  Are aware of your surroundings.  Use safety equipment as directed.  Dress in layers when exercising outdoors.  Drink plenty of water to stay well hydrated. Build up endurance slowly. Start with 10 minutes at a time, and gradually build up to doing 30 minutes at a time. Unless your health care provider gave you different instructions, aim to exercise for a total of 150 minutes a week. Spread out that time so you are working on endurance on 3 or more days a week. Strength exercises Lifting, pulling, or pushing weights helps to strengthen muscles. Having stronger muscles makes it easier to do everyday activities, such as getting up from a chair, climbing stairs, carrying groceries, and playing with grandchildren. Strength exercises include arm and leg exercises that may be done:  With weights.  Without weights (using your own body weight).  With a resistance band. When doing strength exercises:  Move smoothly and steadily. Do not suddenly thrust or jerk the weights, the resistance band, or your body.  Start with no weights or with light weights, and gradually add more weight over time. Eventually, aim to use weights that are hard or very hard for you to lift. This means that you are able to do 8 repetitions with the weight, and the last few repetitions are very challenging.  Lift or push weights into position for 3 seconds, hold the position for 1 second, and then take 3 seconds to return to your starting position.  Breathe out (exhale) during  difficult movements, like lifting or pushing weights. Breathe in (inhale) to relax your muscles before the next repetition.  Consider alternating arms or legs, especially when you first start strength exercises.  Expect some slight muscle soreness after each session. Do strength exercises on 2 or more days a week, for 30 minutes at a time. Avoid exercising the same muscle groups two days in a row. For  example, if you work on your leg muscles one day, work on your arm muscles the next day. When you can do two sets of 10-15 repetitions with a certain weight, increase the amount of weight. Balance Balance exercises can help to prevent falls. Balance exercises include:  Standing on one foot.  Heel-to-toe walk.  Balance walk.  Tai chi. Make sure you have something sturdy to hold onto while doing balance exercises, such as a sturdy chair. As your balance improves, challenge yourself by holding onto the chair with one hand instead of two, and then with no hands. Trying exercises with your eyes closed also challenges your balance, but be sure to have a sturdy surface (like a countertop) close by in case you need it. Do balance exercises as often as you want, or as often as directed by your health care provider. Strength exercises for the lower body also help to improve balance. Flexibility  Flexibility exercises improve how far you can bend, straighten, move, or rotate parts of your body (range of motion). These exercises also help you to do everyday activities such as getting dressed or reaching for objects. Flexibility exercises include stretching different parts of the body, and they may be done in a standing or seated position or on the floor. When stretching, make sure you:  Keep a slight bend in your arms and legs. Avoid completely straightening ("locking") your joints.  Do not stretch so far that you feel pain. You should feel a mild stretching feeling. You may try stretching farther as you become more flexible over time.  Relax and breathe between stretches.  Hold onto something sturdy for balance as needed. Hold each stretch for 10-30 seconds. Repeat each stretch 3-5 times. General safety tips  Exercise in well-lit areas.  Do not hold your breath during exercises or stretches.  Warm up before exercising, and cool down after exercising. This can help prevent injury.  Drink plenty  of water during exercise or any activity that makes you sweat.  Use smooth, steady movements. Do not use sudden, jerking movements, especially when lifting weights or doing flexibility exercises.  If you are not sure if an exercise is safe for you, or you are not sure how to do an exercise, talk with your health care provider. This is especially important if you have had surgery on muscles, bones, or joints (orthopedic surgery). Where to find more information You can find more information about exercise for older adults from:  Your local health department, fitness center, or community center. These facilities may have programs for aging adults.  [employer] on Aging: http://kim-miller.com/  National Council on Aging: www.ncoa.org Summary  Staying physically active is important as you age.  Make sure to contact your health care provider before you start any exercise routine. Ask your health care provider what activities are safe for you.  Doing endurance, strength, balance, and flexibility exercises can help to delay or prevent certain diseases, such as heart disease, diabetes, and bone loss (osteoporosis). This information is not intended to replace advice given to you by your health care provider.  Make sure you discuss any questions you have with your health care provider. Document Released: 07/08/2016 Document Revised: 12/09/2017 Document Reviewed: 07/08/2016 Elsevier Patient Education  2020 [employer].

## 2018-09-14 LAB — CBC WITH DIFFERENTIAL/PLATELET
Absolute Monocytes: 639 cells/uL (ref 200–950)
Basophils Absolute: 50 cells/uL (ref 0–200)
Basophils Relative: 0.7 %
Eosinophils Absolute: 149 cells/uL (ref 15–500)
Eosinophils Relative: 2.1 %
HCT: 39.8 % (ref 35.0–45.0)
Hemoglobin: 13.2 g/dL (ref 11.7–15.5)
Lymphs Abs: 1598 cells/uL (ref 850–3900)
MCH: 29.3 pg (ref 27.0–33.0)
MCHC: 33.2 g/dL (ref 32.0–36.0)
MCV: 88.4 fL (ref 80.0–100.0)
MPV: 10 fL (ref 7.5–12.5)
Monocytes Relative: 9 %
Neutro Abs: 4665 cells/uL (ref 1500–7800)
Neutrophils Relative %: 65.7 %
Platelets: 287 10*3/uL (ref 140–400)
RBC: 4.5 10*6/uL (ref 3.80–5.10)
RDW: 12.3 % (ref 11.0–15.0)
Total Lymphocyte: 22.5 %
WBC: 7.1 10*3/uL (ref 3.8–10.8)

## 2018-09-14 LAB — COMPLETE METABOLIC PANEL WITH GFR
AG Ratio: 2 (calc) (ref 1.0–2.5)
ALT: 11 U/L (ref 6–29)
AST: 14 U/L (ref 10–35)
Albumin: 4.4 g/dL (ref 3.6–5.1)
Alkaline phosphatase (APISO): 77 U/L (ref 37–153)
BUN: 17 mg/dL (ref 7–25)
CO2: 28 mmol/L (ref 20–32)
Calcium: 10.2 mg/dL (ref 8.6–10.4)
Chloride: 105 mmol/L (ref 98–110)
Creat: 0.78 mg/dL (ref 0.60–0.88)
GFR, Est African American: 77 mL/min/{1.73_m2} (ref >=60)
GFR, Est Non African American: 66 mL/min/{1.73_m2} (ref >=60)
Globulin: 2.2 g/dL (calc) (ref 1.9–3.7)
Glucose, Bld: 89 mg/dL (ref 65–99)
Potassium: 4.5 mmol/L (ref 3.5–5.3)
Sodium: 141 mmol/L (ref 135–146)
Total Bilirubin: 1 mg/dL (ref 0.2–1.2)
Total Protein: 6.6 g/dL (ref 6.1–8.1)

## 2018-09-14 LAB — LIPID PANEL
Cholesterol: 176 mg/dL (ref <200)
HDL: 67 mg/dL (ref >=50)
LDL Cholesterol (Calc): 88 mg/dL (calc)
Non-HDL Cholesterol (Calc): 109 mg/dL (calc) (ref <130)
Total CHOL/HDL Ratio: 2.6 (calc) (ref <5.0)
Triglycerides: 117 mg/dL (ref <150)

## 2018-09-14 LAB — TSH: TSH: 3.07 mIU/L (ref 0.40–4.50)

## 2018-09-19 ENCOUNTER — Other Ambulatory Visit: Payer: Self-pay

## 2018-09-19 MED ORDER — LEVOTHYROXINE SODIUM 150 MCG PO TABS: ORAL_TABLET | ORAL | 1 refills | Status: DC

## 2018-10-01 ENCOUNTER — Other Ambulatory Visit: Payer: Self-pay | Admitting: Physician Assistant

## 2018-10-19 NOTE — Progress Notes (Signed)
Assessment and Plan:  Alexandria Davies was seen today for joint pain, muscle pain and fatigue.  Diagnoses and all orders for this visit:  Myalgia/arthralgia/malaise/fatigue Unclear etiology, vague and non-specific, she is off of statin, denies other new medications or notable changes Check basic labs for changes, CK, inflammation, screening autoimmune, r/o Lyme's She plans to complete drive through covid 19 test today  If unremarkable workup, trial antiinflammatory and muscle relaxer x 2 weeks;  Follow up if new/changing symptoms If persistent and not improving recommended follow up with established ortho -     CBC with Differential/Platelet -     Sedimentation rate -     CK -     C-reactive protein -     ANA -     B. burgdorfi antibodies -     COMPLETE METABOLIC PANEL WITH GFR  Neck stiffness -     meloxicam (MOBIC) 15 MG tablet; Take one daily with food for 2 weeks, can take with tylenol, can not take with aleve, iburpofen, then as needed daily for pain -     cyclobenzaprine (FLEXERIL) 5 MG tablet; Take 1 tablet (5 mg total) by mouth 3 (three) times daily as needed for muscle spasms.   Further disposition pending results of labs. Discussed med's effects and SE's.   Over 15 minutes of exam, counseling, chart review, and critical decision making was performed.   Future Appointments  Date Time Provider [place]  12/19/2018 11:00 AM Unk Pinto, MD [facility] None  09/25/2019 11:15 AM Liane Comber, NP [facility] None    ------------------------------------------------------------------------------------------------------------------   HPI BP 122/62   Pulse 68   Temp (!) 96.4 F (35.8 C)   Wt 142 lb (64.4 kg)   SpO2 97%   BMI 24.00 kg/m   85 y.o.female with hx of osteoarthritis (has seen Dr. Latanya Maudlin) presents for evaluation of generalized myalgias and arthralgias x 2-3 weeks; she reports she is afraid to drive due to feeling very stiff in her neck and pain with  rotation. She reports onset was gradual. She reports symptoms are worst when lying down in bed, has difficulty getting up and going but improves some once she forces herself. She reports sensation as "tender," constant, worst in bilateral shoulders but endorses generalized in other areas of body as well. Endorses some fatigue/malaise. Denies fever/chills, swelling in joints, rash, URI sx, dyspnea, cough.   She has been taking advil, previously 1 tab 3 days a week, recently taking BID. She reports was helping initially but doesn't seem to be helping recently.   She denies known tick bites though reports she does spend a lot of time outdoors.   She was on simvastatin 20 mg for many years but stopped a few months ago  She denies known personal or family history of autoimmune disease.   Lab Results  Component Value Date   WBC 7.1 09/13/2018   HGB 13.2 09/13/2018   HCT 39.8 09/13/2018   MCV 88.4 09/13/2018   PLT 287 09/13/2018   BMI is Body mass index is 24 kg/m. No unusual weight loss.  Wt Readings from Last 3 Encounters:  10/24/18 142 lb (64.4 kg)  09/13/18 143 lb (64.9 kg)  06/14/18 139 lb (63 kg)   She is on thyroid medication. Her medication was not changed last visit. Taking 1 tab daily as she has been doing without changes.   Lab Results  Component Value Date   TSH 3.07 09/13/2018  .     Past Medical History:  Diagnosis Date  . Anxiety   . Breast cancer ([place])    left  . Colon polyp 2009   BENIGN POLYPOID  . Diverticulosis of colon (without mention of hemorrhage) 2009  . DJD (degenerative joint disease)   . Family history of malignant neoplasm of gastrointestinal tract 05/06/2011  . HOH (hard of hearing)    Right ear  . Hypercholesteremia   . Hypothyroidism   . IBS (irritable bowel syndrome)   . Internal hemorrhoids without mention of complication   . Primary osteoarthritis of right knee 11/26/2015   S/p TKA  . Sinus drainage   . Vitiligo   . Wears dentures    top   . Wears glasses      Allergies  Allergen Reactions  . Ciprofloxacin   . Epinephrine Other (See Comments)    Heart raced   . Etodolac Hives  . Synephrine     Palpitations   . Flagyl [Metronidazole Hcl] Rash    Current Outpatient Medications on File Prior to Visit  Medication Sig  . aspirin (ASPIRIN 81) 81 MG EC tablet Take 1 tablet daily  . Cholecalciferol (VITAMIN D PO) Take 5,000 Units by mouth 2 (two) times daily.  . clotrimazole-betamethasone (LOTRISONE) cream Apply 1 application topically 2 (two) times daily. (Patient taking differently: Apply 1 application topically as needed. )  . diazepam (VALIUM) 5 MG tablet Take 1/2 to 1 tablet 2 to 3 x /day ONLY if needed for Anxiety Attack  . Ibuprofen (ADVIL PO) Take by mouth 2 (two) times daily.  Marland Kitchen levothyroxine (SYNTHROID) 150 MCG tablet TAKE 1 TABLET BY MOUTH  DAILY  . Probiotic Product (PROBIOTIC PO) Take 1 tablet by mouth daily.  Marland Kitchen dicyclomine (BENTYL) 20 MG tablet Take 1/2 to 1 tablet 3 x /day before meals for IBS (Patient not taking: Reported on 09/13/2018)  . Potassium Gluconate 595 MG CAPS Take by mouth daily.  . simvastatin (ZOCOR) 20 MG tablet Take 1 tablet at Bedtime for Cholesterol (Patient not taking: Reported on 10/24/2018)   No current facility-administered medications on file prior to visit.     ROS: all negative except above.   Physical Exam:  BP 122/62   Pulse 68   Temp (!) 96.4 F (35.8 C)   Wt 142 lb (64.4 kg)   SpO2 97%   BMI 24.00 kg/m   General Appearance: Well nourished, well dressed 85-year-old elder in no apparent distress. Eyes: PERRLA, conjunctiva no swelling or erythema ENT/Mouth: Ext aud canals clear, TMs without erythema, bulging. No erythema, swelling, or exudate on post pharynx.  Tonsils not swollen or erythematous. Hearing normal with R hearing aid present.  Neck: Supple, thyroid normal.  Respiratory: Respiratory effort normal, BS equal bilaterally without rales, rhonchi, wheezing or stridor.   Cardio: RRR with no MRGs. Brisk peripheral pulses without edema.  Abdomen: Soft, + BS.  Non tender, no guarding, rebound, hernias, masses. Lymphatics: Non tender without lymphadenopathy.  Musculoskeletal: Full ROM, 5/5 strength, slow steady gait. No obvious bony deformity, no effusion, heat; no spinal tenderness; no nuchal rigidity; she does have generalized muscular tenderness through traps and lateral neck musculature without specific spasm Skin: Warm, dry without rashes, lesions, ecchymosis.  Neuro: Cranial nerves intact. Normal muscle tone, no cerebellar symptoms. Sensation intact. Reflexes symmetrical and intact (biceps, patellar, achilles). Psych: Awake and oriented X 3, normal affect, Insight and Judgment appropriate.     Izora Ribas, NP 1:26 PM [facility]

## 2018-10-24 ENCOUNTER — Ambulatory Visit (INDEPENDENT_AMBULATORY_CARE_PROVIDER_SITE_OTHER): Payer: Medicare Other | Admitting: Adult Health

## 2018-10-24 ENCOUNTER — Other Ambulatory Visit: Payer: Self-pay

## 2018-10-24 ENCOUNTER — Encounter: Payer: Self-pay | Admitting: Adult Health

## 2018-10-24 VITALS — BP 122/62 | HR 68 | Temp 96.4°F | Wt 142.0 lb

## 2018-10-24 DIAGNOSIS — M436 Torticollis: Secondary | ICD-10-CM | POA: Diagnosis not present

## 2018-10-24 DIAGNOSIS — R5381 Other malaise: Secondary | ICD-10-CM

## 2018-10-24 DIAGNOSIS — R5383 Other fatigue: Secondary | ICD-10-CM | POA: Diagnosis not present

## 2018-10-24 DIAGNOSIS — M791 Myalgia, unspecified site: Secondary | ICD-10-CM | POA: Diagnosis not present

## 2018-10-24 MED ORDER — MELOXICAM 15 MG PO TABS: ORAL_TABLET | ORAL | 1 refills | Status: DC

## 2018-10-24 MED ORDER — CYCLOBENZAPRINE HCL 5 MG PO TABS: 5.0000 mg | ORAL_TABLET | Freq: Three times a day (TID) | ORAL | 0 refills | Status: DC | PRN

## 2018-10-24 NOTE — Patient Instructions (Signed)
Try flexeril and heat for stiffness in neck and shoulders; don't take flexeril with valium or alcohol or if you will be driving  Try mobic 1 tab daily - don't take with ibuprofen - can take with tylenol if needed  If labs are unremarkable but pain persists, recommend you follow up with your orthopedic provider  Muscle Pain, Adult Muscle pain (myalgia) may be mild or severe. In most cases, the pain lasts only a short time and it goes away without treatment. It is normal to feel some muscle pain after starting a workout program. Muscles that have not been used often will be sore at first. Muscle pain may also be caused by many other things, including:  Overuse or muscle strain, especially if you are not in shape. This is the most common cause of muscle pain.  Injury.  Bruises.  Viruses, such as the flu.  Infectious diseases.  A chronic condition that causes muscle tenderness, fatigue, and headache (fibromyalgia).  A condition, such as lupus, in which the body's disease-fighting system attacks other organs in the body (autoimmune or rheumatologic diseases).  Certain drugs, including ACE inhibitors and statins. To diagnose the cause of your muscle pain, your health care provider will do a physical exam and ask questions about the pain and when it began. If you have not had muscle pain for very long, your health care provider may want to wait before doing much testing. If your muscle pain has lasted a long time, your health care provider may want to run tests right away. In some cases, this may include tests to rule out certain conditions or illnesses. Treatment for muscle pain depends on the cause. Home care is often enough to relieve muscle pain. Your health care provider may also prescribe anti-inflammatory medicine. Follow these instructions at home: Activity  If overuse is causing your muscle pain: ? Slow down your activities until the pain goes away. ? Do regular, gentle exercises if  you are not usually active. ? Warm up before exercising. Stretch before and after exercising. This can help lower the risk of muscle pain.  Do not continue working out if the pain is very bad. Bad pain could mean that you have injured a muscle. Managing pain and discomfort   If directed, apply ice to the sore muscle: ? Put ice in a plastic bag. ? Place a towel between your skin and the bag. ? Leave the ice on for 20 minutes, 2-3 times a day.  You may also alternate between applying ice and applying heat as told by your health care provider. To apply heat, use the heat source that your health care provider recommends, such as a moist heat pack or a heating pad. ? Place a towel between your skin and the heat source. ? Leave the heat on for 20-30 minutes. ? Remove the heat if your skin turns bright red. This is especially important if you are unable to feel pain, heat, or cold. You may have a greater risk of getting burned. Medicines  Take over-the-counter and prescription medicines only as told by your health care provider.  Do not drive or use heavy machinery while taking prescription pain medicine. Contact a health care provider if:  Your muscle pain gets worse and medicines do not help.  You have muscle pain that lasts longer than 3 days.  You have a rash or fever along with muscle pain.  You have muscle pain after a tick bite.  You have muscle pain  while working out, even though you are in good physical condition.  You have redness, soreness, or swelling along with muscle pain.  You have muscle pain after starting a new medicine or changing the dose of a medicine. Get help right away if:  You have trouble breathing.  You have trouble swallowing.  You have muscle pain along with a stiff neck, fever, and vomiting.  You have severe muscle weakness or cannot move part of your body. This information is not intended to replace advice given to you by your health care provider.  Make sure you discuss any questions you have with your health care provider. Document Released: 01/08/2006 Document Revised: 01/29/2017 Document Reviewed: 07/09/2015 Elsevier Patient Education  [place].    Cyclobenzaprine tablets What is this medicine? CYCLOBENZAPRINE (sye kloe BEN za preen) is a muscle relaxer. It is used to treat muscle pain, spasms, and stiffness. This medicine may be used for other purposes; ask your health care provider or pharmacist if you have questions. COMMON BRAND NAME(S): Fexmid, Flexeril What should I tell my health care provider before I take this medicine? They need to know if you have any of these conditions:  heart disease, irregular heartbeat, or previous heart attack  liver disease  thyroid problem  an unusual or allergic reaction to cyclobenzaprine, tricyclic antidepressants, lactose, other medicines, foods, dyes, or preservatives  pregnant or trying to get pregnant  breast-feeding How should I use this medicine? Take this medicine by mouth with a glass of water. Follow the directions on the prescription label. If this medicine upsets your stomach, take it with food or milk. Take your medicine at regular intervals. Do not take it more often than directed. Talk to your pediatrician regarding the use of this medicine in children. Special care may be needed. Overdosage: If you think you have taken too much of this medicine contact a poison control center or emergency room at once. NOTE: This medicine is only for you. Do not share this medicine with others. What if I miss a dose? If you miss a dose, take it as soon as you can. If it is almost time for your next dose, take only that dose. Do not take double or extra doses. What may interact with this medicine? Do not take this medicine with any of the following medications:  MAOIs like Carbex, Eldepryl, Marplan, Nardil, and Parnate  narcotic medicines for cough  safinamide This  medicine may also interact with the following medications:  alcohol  bupropion  antihistamines for allergy, cough and cold  certain medicines for anxiety or sleep  certain medicines for bladder problems like oxybutynin, tolterodine  certain medicines for depression like amitriptyline, fluoxetine, sertraline  certain medicines for Parkinson's disease like benztropine, trihexyphenidyl  certain medicines for seizures like phenobarbital, primidone  certain medicines for stomach problems like dicyclomine, hyoscyamine  certain medicines for travel sickness like scopolamine  general anesthetics like halothane, isoflurane, methoxyflurane, propofol  ipratropium  local anesthetics like lidocaine, pramoxine, tetracaine  medicines that relax muscles for surgery  narcotic medicines for pain  phenothiazines like chlorpromazine, mesoridazine, prochlorperazine, thioridazine  verapamil This list may not describe all possible interactions. Give your health care provider a list of all the medicines, herbs, non-prescription drugs, or dietary supplements you use. Also tell them if you smoke, drink alcohol, or use illegal drugs. Some items may interact with your medicine. What should I watch for while using this medicine? Tell your doctor or health care professional if your symptoms  do not start to get better or if they get worse. You may get drowsy or dizzy. Do not drive, use machinery, or do anything that needs mental alertness until you know how this medicine affects you. Do not stand or sit up quickly, especially if you are an older patient. This reduces the risk of dizzy or fainting spells. Alcohol may interfere with the effect of this medicine. Avoid alcoholic drinks. If you are taking another medicine that also causes drowsiness, you may have more side effects. Give your health care provider a list of all medicines you use. Your doctor will tell you how much medicine to take. Do not take more  medicine than directed. Call emergency for help if you have problems breathing or unusual sleepiness. Your mouth may get dry. Chewing sugarless gum or sucking hard candy, and drinking plenty of water may help. Contact your doctor if the problem does not go away or is severe. What side effects may I notice from receiving this medicine? Side effects that you should report to your doctor or health care professional as soon as possible:  allergic reactions like skin rash, itching or hives, swelling of the face, lips, or tongue  breathing problems  chest pain  fast, irregular heartbeat  hallucinations  seizures  unusually weak or tired Side effects that usually do not require medical attention (report to your doctor or health care professional if they continue or are bothersome):  headache  nausea, vomiting This list may not describe all possible side effects. Call your doctor for medical advice about side effects. You may report side effects to FDA at 1-800-FDA-1088. Where should I keep my medicine? Keep out of the reach of children. Store at room temperature between 15 and 30 degrees C (59 and 86 degrees F). Keep container tightly closed. Throw away any unused medicine after the expiration date. NOTE: This sheet is a summary. It may not cover all possible information. If you have questions about this medicine, talk to your doctor, pharmacist, or health care provider.  2020 Elsevier/Gold Standard (2018-01-19 12:49:26)

## 2018-10-26 ENCOUNTER — Other Ambulatory Visit: Payer: Self-pay | Admitting: Adult Health

## 2018-10-26 DIAGNOSIS — M2550 Pain in unspecified joint: Secondary | ICD-10-CM | POA: Insufficient documentation

## 2018-10-26 DIAGNOSIS — M353 Polymyalgia rheumatica: Secondary | ICD-10-CM | POA: Insufficient documentation

## 2018-10-26 LAB — CBC WITH DIFFERENTIAL/PLATELET
Absolute Monocytes: 780 cells/uL (ref 200–950)
Basophils Absolute: 33 cells/uL (ref 0–200)
Basophils Relative: 0.4 %
Eosinophils Absolute: 149 cells/uL (ref 15–500)
Eosinophils Relative: 1.8 %
HCT: 36.4 % (ref 35.0–45.0)
Hemoglobin: 12.4 g/dL (ref 11.7–15.5)
Lymphs Abs: 1204 cells/uL (ref 850–3900)
MCH: 29.2 pg (ref 27.0–33.0)
MCHC: 34.1 g/dL (ref 32.0–36.0)
MCV: 85.8 fL (ref 80.0–100.0)
MPV: 9.8 fL (ref 7.5–12.5)
Monocytes Relative: 9.4 %
Neutro Abs: 6134 cells/uL (ref 1500–7800)
Neutrophils Relative %: 73.9 %
Platelets: 300 10*3/uL (ref 140–400)
RBC: 4.24 10*6/uL (ref 3.80–5.10)
RDW: 12.2 % (ref 11.0–15.0)
Total Lymphocyte: 14.5 %
WBC: 8.3 10*3/uL (ref 3.8–10.8)

## 2018-10-26 LAB — SEDIMENTATION RATE: Sed Rate: 34 mm/h — ABNORMAL HIGH (ref 0–30)

## 2018-10-26 LAB — COMPLETE METABOLIC PANEL WITH GFR
AG Ratio: 1.6 (calc) (ref 1.0–2.5)
ALT: 16 U/L (ref 6–29)
AST: 16 U/L (ref 10–35)
Albumin: 3.9 g/dL (ref 3.6–5.1)
Alkaline phosphatase (APISO): 81 U/L (ref 37–153)
BUN: 16 mg/dL (ref 7–25)
CO2: 26 mmol/L (ref 20–32)
Calcium: 10 mg/dL (ref 8.6–10.4)
Chloride: 107 mmol/L (ref 98–110)
Creat: 0.72 mg/dL (ref 0.60–0.88)
GFR, Est African American: 85 mL/min/{1.73_m2} (ref >=60)
GFR, Est Non African American: 73 mL/min/{1.73_m2} (ref >=60)
Globulin: 2.4 g/dL (calc) (ref 1.9–3.7)
Glucose, Bld: 110 mg/dL — ABNORMAL HIGH (ref 65–99)
Potassium: 4.2 mmol/L (ref 3.5–5.3)
Sodium: 139 mmol/L (ref 135–146)
Total Bilirubin: 1.2 mg/dL (ref 0.2–1.2)
Total Protein: 6.3 g/dL (ref 6.1–8.1)

## 2018-10-26 LAB — C-REACTIVE PROTEIN: CRP: 39.8 mg/L — ABNORMAL HIGH (ref <8.0)

## 2018-10-26 LAB — B. BURGDORFI ANTIBODIES: B burgdorferi Ab IgG+IgM: <0.9 index

## 2018-10-26 LAB — ANTI-NUCLEAR AB-TITER (ANA TITER): ANA Titer 1: 1:80 {titer} — ABNORMAL HIGH

## 2018-10-26 LAB — CK: Total CK: 53 U/L (ref 29–143)

## 2018-10-26 LAB — ANA: Anti Nuclear Antibody (ANA): POSITIVE — AB

## 2018-10-26 MED ORDER — PREDNISONE 20 MG PO TABS: ORAL_TABLET | ORAL | 0 refills | Status: AC

## 2018-11-01 ENCOUNTER — Ambulatory Visit (INDEPENDENT_AMBULATORY_CARE_PROVIDER_SITE_OTHER): Payer: Medicare Other | Admitting: Adult Health Nurse Practitioner

## 2018-11-01 ENCOUNTER — Other Ambulatory Visit: Payer: Self-pay

## 2018-11-01 ENCOUNTER — Telehealth: Payer: Self-pay

## 2018-11-01 ENCOUNTER — Encounter: Payer: Self-pay | Admitting: Adult Health Nurse Practitioner

## 2018-11-01 DIAGNOSIS — M546 Pain in thoracic spine: Secondary | ICD-10-CM | POA: Diagnosis not present

## 2018-11-01 DIAGNOSIS — M545 Low back pain, unspecified: Secondary | ICD-10-CM

## 2018-11-01 DIAGNOSIS — R700 Elevated erythrocyte sedimentation rate: Secondary | ICD-10-CM | POA: Diagnosis not present

## 2018-11-01 DIAGNOSIS — M25552 Pain in left hip: Secondary | ICD-10-CM | POA: Diagnosis not present

## 2018-11-01 DIAGNOSIS — M25551 Pain in right hip: Secondary | ICD-10-CM | POA: Diagnosis not present

## 2018-11-01 DIAGNOSIS — R768 Other specified abnormal immunological findings in serum: Secondary | ICD-10-CM | POA: Diagnosis not present

## 2018-11-01 DIAGNOSIS — R7982 Elevated C-reactive protein (CRP): Secondary | ICD-10-CM

## 2018-11-01 NOTE — Telephone Encounter (Signed)
Hips, shoulders and neck are so sore. Meds are not helping anymore, once she went from two tablets to one, she is hurting again.  Requesting imaging be done.  Please advise.

## 2018-11-01 NOTE — Patient Instructions (Signed)
   INFORMATION ABOUT YOUR XRAY  Can walk into [address] for an Insurance account manager. They will have the order and take you back. You do not any paper work, I should get the result back today or tomorrow. This order is good for a year.  Can call [phone] to schedule an appointment if you wish.

## 2018-11-01 NOTE — Progress Notes (Signed)
Virtual Visit via Telephone Note  I connected with Alexandria Davies on 11/01/18 at  2:00 PM EDT by telephone and verified that I am speaking with the correct person using two identifiers.   I discussed the limitations, risks, security and privacy concerns of performing an evaluation and management service by telephone and the availability of in person appointments. I also discussed with the patient that there may be a patient responsible charge related to this service. The patient expressed understanding and agreed to proceed.   Assessment and Plan:  Today Alexandria Davies has a telephone visit for unresolved and worsening pain.  Discussed possibilities for this.  Previous labs elevated SedRate, CRP and ANA, possible PMR? and referral to rheumatology discussed and initiated.  Concern with increasing pain in back and hips, osteopenic and 85 years of age, will rule out any compression fractures with imaging.  Diagnoses and all orders for this visit:  Spine pain, lumbar -     DG Lumbar Spine Complete; Future -     Ambulatory referral to Rheumatology  Pain in thoracic spine -     DG Thoracic Spine W/Swimmers; Future -     Ambulatory referral to Rheumatology  Pain of both hip joints -     DG Si Joints; Future -     Ambulatory referral to Rheumatology  Elevated sed rate Elevated antinuclear antibody (ANA) level Elevated C-reactive protein (CRP)     Follow Up Instructions: Discussed assessment and treatment plan with the patient. The patient was provided an opportunity to ask questions and all were answered. The patient agrees with the plan of care and demonstrates an understanding of the instructions.   The patient was advised to call back or seek an in-person evaluation if the symptoms worsen or if the condition fails to improve as anticipated.   I provided 20 minutes of non-face-to-face time during this encounter including interview, counseling, chart review, and critical decision making  was preformed.   Future Appointments  Date Time Provider [place]  11/08/2018 11:30 AM Liane Comber, NP [facility] None  12/06/2018  8:15 AM Bo Merino, MD [facility] None  12/19/2018 11:00 AM Unk Pinto, MD [facility] None  01/04/2019 11:15 AM Bo Merino, MD [facility] None  09/25/2019 11:15 AM Liane Comber, NP [facility] None      ------------------------------------------------------------------------------------------------------------------   HPI 85 y.o.female presents for evaluation generalized pain and last seen on 10/24/18 for myalgias and fatigue.  When she sits down she is having excruciating pain up her back and on her sit bones, bilateral.  She describes this as pressure and pain to these areas as high as 9/10 pain with sitting and getting up from laying down.  She reports that she is having severe pain across her back when trying to get out of bed.  She denies any falls or trauma.  She tried the cyclobenzabrine 5 mg TID for the past 3-4 days, prescribed from last visit, she reports this did not help with her symptoms.  She also started prednisone 40mg  for three days and then 20mg  she was to take for 12 days.  She reports minimal relief for first couple days but now the pain is back and increasing.  She is having difficulty sleeping and managing daily live related to this pain and decline in her ability to move around.  She is a very active 85 year old and concerned that she is not improving.   She follows with Dr Latanya Maudlin and in June, three knee injections and  reports she is doing well with mobility and pain.   Past Medical History:  Diagnosis Date  . Anxiety   . Breast cancer ([place])    left  . Colon polyp 2009   BENIGN POLYPOID  . Diverticulosis of colon (without mention of hemorrhage) 2009  . DJD (degenerative joint disease)   . Family history of malignant neoplasm of gastrointestinal tract 05/06/2011  . HOH (hard of hearing)    Right ear  .  Hypercholesteremia   . Hypothyroidism   . IBS (irritable bowel syndrome)   . Internal hemorrhoids without mention of complication   . Primary osteoarthritis of right knee 11/26/2015   S/p TKA  . Sinus drainage   . Vitiligo   . Wears dentures    top  . Wears glasses      Allergies  Allergen Reactions  . Ciprofloxacin   . Epinephrine Other (See Comments)    Heart raced   . Etodolac Hives  . Synephrine     Palpitations   . Flagyl [Metronidazole Hcl] Rash    Current Outpatient Medications on File Prior to Visit  Medication Sig  . aspirin (ASPIRIN 81) 81 MG EC tablet Take 1 tablet daily  . Cholecalciferol (VITAMIN D PO) Take 5,000 Units by mouth 2 (two) times daily.  . clotrimazole-betamethasone (LOTRISONE) cream Apply 1 application topically 2 (two) times daily. (Patient taking differently: Apply 1 application topically as needed. )  . cyclobenzaprine (FLEXERIL) 5 MG tablet Take 1 tablet (5 mg total) by mouth 3 (three) times daily as needed for muscle spasms.  . diazepam (VALIUM) 5 MG tablet Take 1/2 to 1 tablet 2 to 3 x /day ONLY if needed for Anxiety Attack  . dicyclomine (BENTYL) 20 MG tablet Take 1/2 to 1 tablet 3 x /day before meals for IBS (Patient not taking: Reported on 09/13/2018)  . Ibuprofen (ADVIL PO) Take by mouth 2 (two) times daily.  Marland Kitchen levothyroxine (SYNTHROID) 150 MCG tablet TAKE 1 TABLET BY MOUTH  DAILY  . meloxicam (MOBIC) 15 MG tablet Take one daily with food for 2 weeks, can take with tylenol, can not take with aleve, iburpofen, then as needed daily for pain  . Potassium Gluconate 595 MG CAPS Take by mouth daily.  . predniSONE (DELTASONE) 20 MG tablet 2 tablets daily with food for 3 days, then 1 tab a day for 12 days.  . Probiotic Product (PROBIOTIC PO) Take 1 tablet by mouth daily.  . simvastatin (ZOCOR) 20 MG tablet Take 1 tablet at Bedtime for Cholesterol (Patient not taking: Reported on 10/24/2018)   No current facility-administered medications on file prior  to visit.     ROS: all negative except above.   General : Well sounding patient in no apparent distress HEENT: no hoarseness, no cough for duration of visit Lungs: speaks in complete sentences, no audible wheezing, no apparent distress Neurological: alert, oriented x 3 Psychiatric: pleasant, judgement appropriate    Garnet Sierras, NP 14:40 PM [facility]

## 2018-11-02 ENCOUNTER — Ambulatory Visit
Admission: RE | Admit: 2018-11-02 | Discharge: 2018-11-02 | Disposition: A | Discharge status: Home / Self Care | Payer: Medicare Other | Source: Ambulatory Visit | Attending: Adult Health Nurse Practitioner | Admitting: Adult Health Nurse Practitioner

## 2018-11-02 DIAGNOSIS — M25551 Pain in right hip: Secondary | ICD-10-CM

## 2018-11-02 DIAGNOSIS — M545 Low back pain, unspecified: Secondary | ICD-10-CM

## 2018-11-02 DIAGNOSIS — M47814 Spondylosis without myelopathy or radiculopathy, thoracic region: Secondary | ICD-10-CM | POA: Diagnosis not present

## 2018-11-02 DIAGNOSIS — R102 Pelvic and perineal pain: Secondary | ICD-10-CM | POA: Diagnosis not present

## 2018-11-02 DIAGNOSIS — M546 Pain in thoracic spine: Secondary | ICD-10-CM

## 2018-11-02 DIAGNOSIS — M25552 Pain in left hip: Secondary | ICD-10-CM

## 2018-11-02 DIAGNOSIS — M47816 Spondylosis without myelopathy or radiculopathy, lumbar region: Secondary | ICD-10-CM | POA: Diagnosis not present

## 2018-11-03 ENCOUNTER — Telehealth: Payer: Self-pay

## 2018-11-03 NOTE — Progress Notes (Deleted)
Assessment and Plan:  D[place] was seen today for joint pain, muscle pain and fatigue.  Diagnoses and all orders for this visit:  Myalgia/arthralgia/malaise/fatigue   Unclear etiology, vague and non-specific, she is off of statin, denies other new medications or notable changes Check basic labs for changes, CK, inflammation, screening autoimmune, r/o Lyme's She plans to complete drive through covid 19 test today  If unremarkable workup, trial antiinflammatory and muscle relaxer x 2 weeks;  Follow up if new/changing symptoms If persistent and not improving recommended follow up with established ortho -     CBC with Differential/Platelet -     Sedimentation rate -     CK -     C-reactive protein -     ANA -     B. burgdorfi antibodies -     COMPLETE METABOLIC PANEL WITH GFR  Neck stiffness -     meloxicam (MOBIC) 15 MG tablet; Take one daily with food for 2 weeks, can take with tylenol, can not take with aleve, iburpofen, then as needed daily for pain -     cyclobenzaprine (FLEXERIL) 5 MG tablet; Take 1 tablet (5 mg total) by mouth 3 (three) times daily as needed for muscle spasms.   Further disposition pending results of labs. Discussed med's effects and SE's.   Over 15 minutes of exam, counseling, chart review, and critical decision making was performed.   Future Appointments  Date Time Provider [place]  11/08/2018 11:30 AM Liane Comber, NP [facility] None  12/06/2018  8:15 AM Bo Merino, MD [facility] None  12/19/2018 11:00 AM Unk Pinto, MD [facility] None  01/04/2019 11:15 AM Bo Merino, MD [facility] None  09/25/2019 11:15 AM Liane Comber, NP [facility] None    ------------------------------------------------------------------------------------------------------------------   HPI There were no vitals taken for this visit.  85 y.o.female with hx of osteoarthritis (has seen Dr. Latanya Maudlin) presents for evaluation of generalized myalgias and arthralgias  x 2-3 weeks; she reports she is afraid to drive due to feeling very stiff in her neck and pain with rotation. She reports onset was gradual. She reports symptoms are worst when lying down in bed, has difficulty getting up and going but improves some once she forces herself. She reports sensation as "tender," constant, worst in bilateral shoulders but endorses generalized in other areas of body as well. Endorses some fatigue/malaise. Denies fever/chills, swelling in joints, rash, URI sx, dyspnea, cough.   She has been taking advil, previously 1 tab 3 days a week, recently taking BID. She reports was helping initially but doesn't seem to be helping recently.   She denies known tick bites though reports she does spend a lot of time outdoors.   She was on simvastatin 20 mg for many years but stopped a few months ago  She denies known personal or family history of autoimmune disease.   Lab Results  Component Value Date   WBC 8.3 10/24/2018   HGB 12.4 10/24/2018   HCT 36.4 10/24/2018   MCV 85.8 10/24/2018   PLT 300 10/24/2018   BMI is There is no height or weight on file to calculate BMI. No unusual weight loss.  Wt Readings from Last 3 Encounters:  10/24/18 142 lb (64.4 kg)  09/13/18 143 lb (64.9 kg)  06/14/18 139 lb (63 kg)   She is on thyroid medication. Her medication was not changed last visit. Taking 1 tab daily as she has been doing without changes.   Lab Results  Component Value Date   TSH  3.07 09/13/2018  .     Past Medical History:  Diagnosis Date  . Anxiety   . Breast cancer ([place])    left  . Colon polyp 2009   BENIGN POLYPOID  . Diverticulosis of colon (without mention of hemorrhage) 2009  . DJD (degenerative joint disease)   . Family history of malignant neoplasm of gastrointestinal tract 05/06/2011  . HOH (hard of hearing)    Right ear  . Hypercholesteremia   . Hypothyroidism   . IBS (irritable bowel syndrome)   . Internal hemorrhoids without mention of  complication   . Primary osteoarthritis of right knee 11/26/2015   S/p TKA  . Sinus drainage   . Vitiligo   . Wears dentures    top  . Wears glasses      Allergies  Allergen Reactions  . Ciprofloxacin   . Epinephrine Other (See Comments)    Heart raced   . Etodolac Hives  . Synephrine     Palpitations   . Flagyl [Metronidazole Hcl] Rash    Current Outpatient Medications on File Prior to Visit  Medication Sig  . aspirin (ASPIRIN 81) 81 MG EC tablet Take 1 tablet daily  . Cholecalciferol (VITAMIN D PO) Take 5,000 Units by mouth 2 (two) times daily.  . clotrimazole-betamethasone (LOTRISONE) cream Apply 1 application topically 2 (two) times daily. (Patient taking differently: Apply 1 application topically as needed. )  . cyclobenzaprine (FLEXERIL) 5 MG tablet Take 1 tablet (5 mg total) by mouth 3 (three) times daily as needed for muscle spasms.  . diazepam (VALIUM) 5 MG tablet Take 1/2 to 1 tablet 2 to 3 x /day ONLY if needed for Anxiety Attack  . dicyclomine (BENTYL) 20 MG tablet Take 1/2 to 1 tablet 3 x /day before meals for IBS (Patient not taking: Reported on 09/13/2018)  . Ibuprofen (ADVIL PO) Take by mouth 2 (two) times daily.  Marland Kitchen levothyroxine (SYNTHROID) 150 MCG tablet TAKE 1 TABLET BY MOUTH  DAILY  . meloxicam (MOBIC) 15 MG tablet Take one daily with food for 2 weeks, can take with tylenol, can not take with aleve, iburpofen, then as needed daily for pain  . Potassium Gluconate 595 MG CAPS Take by mouth daily.  . predniSONE (DELTASONE) 20 MG tablet 2 tablets daily with food for 3 days, then 1 tab a day for 12 days.  . Probiotic Product (PROBIOTIC PO) Take 1 tablet by mouth daily.  . simvastatin (ZOCOR) 20 MG tablet Take 1 tablet at Bedtime for Cholesterol (Patient not taking: Reported on 10/24/2018)   No current facility-administered medications on file prior to visit.     ROS: all negative except above.   Physical Exam:  There were no vitals taken for this  visit.  General Appearance: Well nourished, well dressed elder in no apparent distress. Eyes: PERRLA, conjunctiva no swelling or erythema ENT/Mouth: Ext aud canals clear, TMs without erythema, bulging. No erythema, swelling, or exudate on post pharynx.  Tonsils not swollen or erythematous. Hearing normal with R hearing aid present.  Neck: Supple, thyroid normal.  Respiratory: Respiratory effort normal, BS equal bilaterally without rales, rhonchi, wheezing or stridor.  Cardio: RRR with no MRGs. Brisk peripheral pulses without edema.  Abdomen: Soft, + BS.  Non tender, no guarding, rebound, hernias, masses. Lymphatics: Non tender without lymphadenopathy.  Musculoskeletal: Full ROM, 5/5 strength, slow steady gait. No obvious bony deformity, no effusion, heat; no spinal tenderness; no nuchal rigidity; she does have generalized muscular tenderness through traps and lateral  neck musculature without specific spasm Skin: Warm, dry without rashes, lesions, ecchymosis.  Neuro: Cranial nerves intact. Normal muscle tone, no cerebellar symptoms. Sensation intact. Reflexes symmetrical and intact (biceps, patellar, achilles). Psych: Awake and oriented X 3, normal affect, Insight and Judgment appropriate.     Izora Ribas, NP 1:53 PM [facility]

## 2018-11-03 NOTE — Telephone Encounter (Signed)
Patient inquiring if xray results are available. Please advise.

## 2018-11-08 ENCOUNTER — Ambulatory Visit: Payer: Medicare Other | Admitting: Adult Health

## 2018-11-08 ENCOUNTER — Telehealth: Payer: Self-pay | Admitting: *Deleted
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
## 2018-11-09 ENCOUNTER — Telehealth: Payer: Self-pay | Admitting: *Deleted

## 2018-11-09 NOTE — Telephone Encounter (Signed)
Patient aware.

## 2018-11-09 NOTE — Telephone Encounter (Signed)
Patient asked if she needs to reschedule her appointment from 11/08/2018.  Per Liane Comber, NP, the patient can reschedule for extra lab work now or she can wait until her 10/08/2020appointment with Dr Estanislado Pandy.  Patient plans to wait until her Dr Estanislado Pandy appointment.

## 2018-11-14 ENCOUNTER — Encounter: Payer: Self-pay | Admitting: Internal Medicine

## 2018-11-14 ENCOUNTER — Telehealth: Payer: Self-pay | Admitting: *Deleted
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
## 2018-11-14 NOTE — Telephone Encounter (Signed)
Patient called and reported she is having pain "all over". She has finished the Prednisone RX. Per Dr Melford Aase, she can try Aleve 220 mg 2 tablets three times a day with meals, along with Tylenol 500 mg 2 tablets three times a day with meals. Patient will have an office visit here 11/15/2018 to discuss paine and possible x-rays.

## 2018-11-15 ENCOUNTER — Other Ambulatory Visit: Payer: Self-pay

## 2018-11-15 ENCOUNTER — Ambulatory Visit (INDEPENDENT_AMBULATORY_CARE_PROVIDER_SITE_OTHER): Payer: Medicare Other | Admitting: Internal Medicine

## 2018-11-15 ENCOUNTER — Encounter: Payer: Self-pay | Admitting: Internal Medicine

## 2018-11-15 VITALS — BP 132/66 | HR 92 | Temp 97.5°F | Resp 16 | Ht 64.5 in | Wt 141.8 lb

## 2018-11-15 DIAGNOSIS — M7062 Trochanteric bursitis, left hip: Secondary | ICD-10-CM | POA: Diagnosis not present

## 2018-11-15 DIAGNOSIS — M159 Polyosteoarthritis, unspecified: Secondary | ICD-10-CM | POA: Diagnosis not present

## 2018-11-15 DIAGNOSIS — M7061 Trochanteric bursitis, right hip: Secondary | ICD-10-CM

## 2018-11-15 DIAGNOSIS — M7070 Other bursitis of hip, unspecified hip: Secondary | ICD-10-CM

## 2018-11-15 DIAGNOSIS — M791 Myalgia, unspecified site: Secondary | ICD-10-CM

## 2018-11-15 DIAGNOSIS — M2550 Pain in unspecified joint: Secondary | ICD-10-CM

## 2018-11-15 MED ORDER — PREDNISONE 20 MG PO TABS: ORAL_TABLET | ORAL | 0 refills | Status: DC

## 2018-11-17 ENCOUNTER — Telehealth: Payer: Self-pay | Admitting: *Deleted

## 2018-11-17 LAB — CYCLIC CITRUL PEPTIDE ANTIBODY, IGG: Cyclic Citrullin Peptide Ab: <16 UNITS

## 2018-11-17 LAB — ANCA SCREEN W REFLEX TITER: ANCA Screen: NEGATIVE

## 2018-11-17 LAB — ANTI-SMITH ANTIBODY: ENA SM Ab Ser-aCnc: <1 AI

## 2018-11-17 LAB — C-REACTIVE PROTEIN: CRP: 65.4 mg/L — ABNORMAL HIGH (ref <8.0)

## 2018-11-17 NOTE — Telephone Encounter (Signed)
Per Dr Melford Aase, the patient should take Tylenol 500 mg 2 tablet at each meal and continue her Prednisone taper.  Patient is aware.
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
## 2018-11-26 ENCOUNTER — Other Ambulatory Visit: Payer: Self-pay | Admitting: Internal Medicine

## 2018-12-06 ENCOUNTER — Other Ambulatory Visit: Payer: Self-pay

## 2018-12-06 ENCOUNTER — Ambulatory Visit (INDEPENDENT_AMBULATORY_CARE_PROVIDER_SITE_OTHER): Payer: Medicare Other | Admitting: Rheumatology

## 2018-12-06 ENCOUNTER — Encounter: Payer: Self-pay | Admitting: Rheumatology

## 2018-12-06 ENCOUNTER — Ambulatory Visit (INDEPENDENT_AMBULATORY_CARE_PROVIDER_SITE_OTHER): Payer: Medicare Other

## 2018-12-06 VITALS — BP 160/74 | HR 72 | Resp 12 | Ht 64.5 in | Wt 142.6 lb

## 2018-12-06 VITALS — Temp 97.6°F

## 2018-12-06 DIAGNOSIS — M353 Polymyalgia rheumatica: Secondary | ICD-10-CM

## 2018-12-06 DIAGNOSIS — Z23 Encounter for immunization: Secondary | ICD-10-CM | POA: Diagnosis not present

## 2018-12-06 DIAGNOSIS — M8589 Other specified disorders of bone density and structure, multiple sites: Secondary | ICD-10-CM

## 2018-12-06 DIAGNOSIS — M1712 Unilateral primary osteoarthritis, left knee: Secondary | ICD-10-CM

## 2018-12-06 DIAGNOSIS — R7982 Elevated C-reactive protein (CRP): Secondary | ICD-10-CM

## 2018-12-06 DIAGNOSIS — M7070 Other bursitis of hip, unspecified hip: Secondary | ICD-10-CM

## 2018-12-06 DIAGNOSIS — R768 Other specified abnormal immunological findings in serum: Secondary | ICD-10-CM | POA: Diagnosis not present

## 2018-12-06 DIAGNOSIS — R700 Elevated erythrocyte sedimentation rate: Secondary | ICD-10-CM | POA: Diagnosis not present

## 2018-12-06 DIAGNOSIS — Z96652 Presence of left artificial knee joint: Secondary | ICD-10-CM | POA: Diagnosis not present

## 2018-12-06 MED ORDER — PREDNISONE 5 MG PO TABS: 15.0000 mg | ORAL_TABLET | Freq: Every day | ORAL | 0 refills | Status: DC

## 2018-12-06 NOTE — Patient Instructions (Addendum)
Decrease prednisone to 15 mg( three 5 mg tablets) daily for 1 month.

## 2018-12-06 NOTE — Progress Notes (Signed)
REPORTS FOR HD FLU

## 2018-12-17 DIAGNOSIS — M1712 Unilateral primary osteoarthritis, left knee: Secondary | ICD-10-CM | POA: Diagnosis not present

## 2018-12-17 DIAGNOSIS — Z96651 Presence of right artificial knee joint: Secondary | ICD-10-CM | POA: Diagnosis not present

## 2018-12-18 ENCOUNTER — Encounter: Payer: Self-pay | Admitting: Internal Medicine

## 2018-12-18 NOTE — Patient Instructions (Addendum)
Polymyalgia Rheumatica      Polymyalgia rheumatica (PMR) is an inflammatory disorder that causes the muscles and joints to ache and become stiff.      Sometimes, PMR leads to a more dangerous condition that can cause vision loss (temporal arteritis or giant cell arteritis).   What are the causes?  The exact cause of PMR is not known.  What increases the risk?  You are more likely to develop this condition if you are:  Female.  83 years of age or older.  Caucasian.   What are the signs or symptoms?  Pain and stiffness are the main symptoms of PMR. Symptoms may:  Be worse after inactivity and in the morning.    Affect your: ? Hips, buttocks, and thighs. ? Neck, arms, and shoulders. This can make it hard to raise your arms above your head. ? Hands and wrists. ?  Other symptoms include:   Fever.  Tiredness.  Weakness.  Depression.  Decreased appetite. This may lead to weight loss.   Symptoms may start slowly or suddenly.  How is this diagnosed?  This condition is diagnosed with your medical history and a physical exam. You may need to see a health care provider who specializes in diseases of the joints, muscles, and bones (rheumatologist). You may also have tests, including:  Blood tests.  X-rays.   How is this treated?  PMR usually goes away without treatment, but it may take years. Your health care provider may recommend low-dose steroids (Prednisone)  and other medicines to help manage your symptoms of pain and stiffness. Regular exercise and rest will also help your symptoms.  Follow these instructions at home:   Take over-the-counter and prescription medicines only as told by your health care provider.  Make sure to get enough rest and sleep.  Eat a healthy and nutritious diet.  Try to exercise most days of the week. Ask your health care provider what type of exercise is best for you.  Keep all follow-up visits as told by your health care  provider. This is important.   Contact a health care provider if:   Your symptoms do not improve with medicine.  You have side effects from steroids. These may include: ? Weight gain. ? Swelling. ? Insomnia. ? Mood changes. ? Bruising. ? High blood sugar readings, if you have diabetes. ? Higher than normal blood pressure readings, if you monitor your blood pressure. ?  Get help right away if:   You develop symptoms of temporal arteritis, such as: ? A change in vision. ? Severe headache. ? Scalp pain. Jaw pain.    Summary    Polymyalgia rheumatica is an inflammatory disorder that causes aching and stiffness in your muscles and joints.   This condition usually goes away without treatment. Your health care provider may give you low-dose steroids to help manage your pain and stiffness.   Rest and regular exercise will help the symptoms.   ==================================   Vit D  & Vit C 1,000 mg   are recommended to help protect  against the Covid-19 and other Corona viruses.  +++++++++++++++++++++++++++++++++  Also it's recommended  to take  Zinc 50 mg  to help  protect against the Covid-19   and best place to get  is also on [employer].com  and don't pay more than 6-8 cents /pill !  ================================ Coronavirus (COVID-19) Are you at risk?  Are you at risk for the Coronavirus (COVID-19)?  To be considered HIGH RISK for  Coronavirus ([id number]), you have to meet the following criteria:  . Traveled to [place], [place], [place], [place] or [place]; or in the [place] to [place], [place], [place]  . or [place]; and have fever, cough, and shortness of breath within the last 2 weeks of travel OR . Been in close contact with a person diagnosed with COVID-19 within the last 2 weeks and have  . fever, cough,and shortness of breath .  . IF YOU DO NOT MEET THESE CRITERIA, YOU ARE CONSIDERED LOW RISK FOR COVID-19.  What to do if you are  HIGH RISK for COVID-19?  Marland Kitchen If you are having a medical emergency, call 911. . Seek medical care right away. Before you go to a doctor's office, urgent care or emergency department, .  call ahead and tell them about your recent travel, contact with someone diagnosed with COVID-19  .  and your symptoms.  . You should receive instructions from your physician's office regarding next steps of care.  . When you arrive at healthcare provider, tell the healthcare staff immediately you have returned from  . visiting [place], [place], [place], [place] or [place]; or traveled in the [place] to [place], [place],  . [place] or [place] in the last two weeks or you have been in close contact with a person diagnosed with  . COVID-19 in the last 2 weeks.   . Tell the health care staff about your symptoms: fever, cough and shortness of breath. . After you have been seen by a medical provider, you will be either: o Tested for (COVID-19) and discharged home on quarantine except to seek medical care if  o symptoms worsen, and asked to  - Stay home and avoid contact with others until you get your results (4-5 days)  - Avoid travel on public transportation if possible (such as bus, train, or airplane) or o Sent to the Emergency Department by EMS for evaluation, COVID-19 testing  and  o possible admission depending on your condition and test results.  What to do if you are LOW RISK for COVID-19?  Reduce your risk of any infection by using the same precautions used for avoiding the common cold or flu:  Marland Kitchen Wash your hands often with soap and warm water for at least 20 seconds.  If soap and water are not readily available,  . use an alcohol-based hand sanitizer with at least 60% alcohol.  . If coughing or sneezing, cover your mouth and nose by coughing or sneezing into the elbow areas of your shirt or coat, .  into a tissue or into your sleeve (not your hands). . Avoid shaking hands with others and  consider head nods or verbal greetings only. . Avoid touching your eyes, nose, or mouth with unwashed hands.  . Avoid close contact with people who are sick. . Avoid places or events with large numbers of people in one location, like concerts or sporting events. . Carefully consider travel plans you have or are making. . If you are planning any travel outside or inside the [place], visit the CDC's Travelers' Health webpage for the latest health notices. . If you have some symptoms but not all symptoms, continue to monitor at home and seek medical attention  . if your symptoms worsen. . If you are having a medical emergency, call 911.   . >>>>>>>>>>>>>>>>>>>>>>>>>>>>>>>>> . We Do NOT Approve of  [facility]  To Do Home Visits &  We Do NOT Approve of [employer] > > > > > > > > > > > > > > > > > > > > > > > > > > > > > > > > > > > > > > >  Preventive Care for Adults  A healthy lifestyle and preventive care can promote health and wellness. Preventive health guidelines for women include the following key practices.  A routine yearly physical is a good way to check with your health care provider about your health and preventive screening. It is a chance to share any concerns and updates on your health and to receive a thorough exam.  Visit your dentist for a routine exam and preventive care every 6 months. Brush your teeth twice a day and floss once a day. Good oral hygiene prevents tooth decay and gum disease.  The frequency of eye exams is based on your age, health, family medical history, use of contact lenses, and other factors. Follow your health care provider's recommendations for frequency of eye exams.  Eat a healthy diet. Foods like vegetables, fruits, whole grains, low-fat dairy products, and lean protein foods contain the nutrients you need without too many calories. Decrease your intake of foods high in solid fats, added sugars, and  salt. Eat the right amount of calories for you. Get information about a proper diet from your health care provider, if necessary.  Regular physical exercise is one of the most important things you can do for your health. Most adults should get at least 150 minutes of moderate-intensity exercise (any activity that increases your heart rate and causes you to sweat) each week. In addition, most adults need muscle-strengthening exercises on 2 or more days a week.  Maintain a healthy weight. The body mass index (BMI) is a screening tool to identify possible weight problems. It provides an estimate of body fat based on height and weight. Your health care provider can find your BMI and can help you achieve or maintain a healthy weight. For adults 20 years and older:  A BMI below 18.5 is considered underweight.  A BMI of 18.5 to 24.9 is normal.  A BMI of 25 to 29.9 is considered overweight.  A BMI of 30 and above is considered obese.  Maintain normal blood lipids and cholesterol levels by exercising and minimizing your intake of saturated fat. Eat a balanced diet with plenty of fruit and vegetables. If your lipid or cholesterol levels are high, you are over 50, or you are at high risk for heart disease, you may need your cholesterol levels checked more frequently. Ongoing high lipid and cholesterol levels should be treated with medicines if diet and exercise are not working.  If you smoke, find out from your health care provider how to quit. If you do not use tobacco, do not start.  Lung cancer screening is recommended for adults aged 55-80 years who are at high risk for developing lung cancer because of a history of smoking. A yearly low-dose CT scan of the lungs is recommended for people who have at least a 30-pack-year history of smoking and are a current smoker or have quit within the past 15 years. A pack year of smoking is smoking an average of 1 pack of cigarettes a day for 1 year (for example: 1  pack a day for 30 years or 2 packs a day for 15 years). Yearly screening should continue until the smoker has stopped smoking for at least 15  years. Yearly screening should be stopped for people who develop a health problem that would prevent them from having lung cancer treatment.  Avoid use of street drugs. Do not share needles with anyone. Ask for help if you need support or instructions about stopping the use of drugs.  High blood pressure causes heart disease and increases the risk of stroke.  Ongoing high blood pressure should be treated with medicines if weight loss and exercise do not work.  If you are 62-58 years old, ask your health care provider if you should take aspirin to prevent strokes.  Diabetes screening involves taking a blood sample to check your fasting blood sugar level. This should be done once every 3 years, after age 74, if you are within normal weight and without risk factors for diabetes. Testing should be considered at a younger age or be carried out more frequently if you are overweight and have at least 1 risk factor for diabetes.  Breast cancer screening is essential preventive care for women. You should practice "breast self-awareness." This means understanding the normal appearance and feel of your breasts and may include breast self-examination. Any changes detected, no matter how small, should be reported to a health care provider. Women in their 60s and 30s should have a clinical breast exam (CBE) by a health care provider as part of a regular health exam every 1 to 3 years. After age 47, women should have a CBE every year. Starting at age 37, women should consider having a mammogram (breast X-ray test) every year. Women who have a family history of breast cancer should talk to their health care provider about genetic screening. Women at a high risk of breast cancer should talk to their health care providers about having an MRI and a mammogram every year.  Breast  cancer gene (BRCA)-related cancer risk assessment is recommended for women who have family members with BRCA-related cancers. BRCA-related cancers include breast, ovarian, tubal, and peritoneal cancers. Having family members with these cancers may be associated with an increased risk for harmful changes (mutations) in the breast cancer genes BRCA1 and BRCA2. Results of the assessment will determine the need for genetic counseling and BRCA1 and BRCA2 testing.  Routine pelvic exams to screen for cancer are no longer recommended for nonpregnant women who are considered low risk for cancer of the pelvic organs (ovaries, uterus, and vagina) and who do not have symptoms. Ask your health care provider if a screening pelvic exam is right for you.  If you have had past treatment for cervical cancer or a condition that could lead to cancer, you need Pap tests and screening for cancer for at least 20 years after your treatment. If Pap tests have been discontinued, your risk factors (such as having a new sexual partner) need to be reassessed to determine if screening should be resumed. Some women have medical problems that increase the chance of getting cervical cancer. In these cases, your health care provider may recommend more frequent screening and Pap tests.    Colorectal cancer can be detected and often prevented. Most routine colorectal cancer screening begins at the age of 54 years and continues through age 47 years. However, your health care provider may recommend screening at an earlier age if you have risk factors for colon cancer. On a yearly basis, your health care provider may provide home test kits to check for hidden blood in the stool. Use of a small camera at the end of a tube, to  directly examine the colon (sigmoidoscopy or colonoscopy), can detect the earliest forms of colorectal cancer. Talk to your health care provider about this at age 65, when routine screening begins.  Direct exam of the colon  should be repeated every 5-10 years through age 73 years, unless early forms of pre-cancerous polyps or small growths are found.  Osteoporosis is a disease in which the bones lose minerals and strength with aging. This can result in serious bone fractures or breaks. The risk of osteoporosis can be identified using a bone density scan. Women ages 32 years and over and women at risk for fractures or osteoporosis should discuss screening with their health care providers. Ask your health care provider whether you should take a calcium supplement or vitamin D to reduce the rate of osteoporosis.  Menopause can be associated with physical symptoms and risks. Hormone replacement therapy is available to decrease symptoms and risks. You should talk to your health care provider about whether hormone replacement therapy is right for you.  Use sunscreen. Apply sunscreen liberally and repeatedly throughout the day. You should seek shade when your shadow is shorter than you. Protect yourself by wearing long sleeves, pants, a wide-brimmed hat, and sunglasses year round, whenever you are outdoors.  Once a month, do a whole body skin exam, using a mirror to look at the skin on your back. Tell your health care provider of new moles, moles that have irregular borders, moles that are larger than a pencil eraser, or moles that have changed in shape or color.  Stay current with required vaccines (immunizations).  Influenza vaccine. All adults should be immunized every year.  Tetanus, diphtheria, and acellular pertussis (Td, Tdap) vaccine. Pregnant women should receive 1 dose of Tdap vaccine during each pregnancy. The dose should be obtained regardless of the length of time since the last dose. Immunization is preferred during the 27th-36th week of gestation. An adult who has not previously received Tdap or who does not know her vaccine status should receive 1 dose of Tdap. This initial dose should be followed by tetanus and  diphtheria toxoids (Td) booster doses every 10 years. Adults with an unknown or incomplete history of completing a 3-dose immunization series with Td-containing vaccines should begin or complete a primary immunization series including a Tdap dose. Adults should receive a Td booster every 10 years.    Zoster vaccine. One dose is recommended for adults aged 72 years or older unless certain conditions are present.    Pneumococcal 13-valent conjugate (PCV13) vaccine. When indicated, a person who is uncertain of her immunization history and has no record of immunization should receive the PCV13 vaccine. An adult aged 49 years or older who has certain medical conditions and has not been previously immunized should receive 1 dose of PCV13 vaccine. This PCV13 should be followed with a dose of pneumococcal polysaccharide (PPSV23) vaccine. The PPSV23 vaccine dose should be obtained at least 1 or more year(s) after the dose of PCV13 vaccine. An adult aged 73 years or older who has certain medical conditions and previously received 1 or more doses of PPSV23 vaccine should receive 1 dose of PCV13. The PCV13 vaccine dose should be obtained 1 or more years after the last PPSV23 vaccine dose.    Pneumococcal polysaccharide (PPSV23) vaccine. When PCV13 is also indicated, PCV13 should be obtained first. All adults aged 46 years and older should be immunized. An adult younger than age 66 years who has certain medical conditions should be immunized. Any  person who resides in a nursing home or long-term care facility should be immunized. An adult smoker should be immunized. People with an immunocompromised condition and certain other conditions should receive both PCV13 and PPSV23 vaccines. People with human immunodeficiency virus (HIV) infection should be immunized as soon as possible after diagnosis. Immunization during chemotherapy or radiation therapy should be avoided. Routine use of PPSV23 vaccine is not recommended  for American Indians, [place] Natives, or people younger than 65 years unless there are medical conditions that require PPSV23 vaccine. When indicated, people who have unknown immunization and have no record of immunization should receive PPSV23 vaccine. One-time revaccination 5 years after the first dose of PPSV23 is recommended for people aged 19-64 years who have chronic kidney failure, nephrotic syndrome, asplenia, or immunocompromised conditions. People who received 1-2 doses of PPSV23 before age 15 years should receive another dose of PPSV23 vaccine at age 83 years or later if at least 5 years have passed since the previous dose. Doses of PPSV23 are not needed for people immunized with PPSV23 at or after age 64 years.   Preventive Services / Frequency  Ages 30 years and over  Blood pressure check.  Lipid and cholesterol check.  Lung cancer screening. / Every year if you are aged 51-80 years and have a 30-pack-year history of smoking and currently smoke or have quit within the past 15 years. Yearly screening is stopped once you have quit smoking for at least 15 years or develop a health problem that would prevent you from having lung cancer treatment.  Clinical breast exam.** / Every year after age 79 years.   BRCA-related cancer risk assessment.** / For women who have family members with a BRCA-related cancer (breast, ovarian, tubal, or peritoneal cancers).  Mammogram.** / Every year beginning at age 67 years and continuing for as long as you are in good health. Consult with your health care provider.  Pap test.** / Every 3 years starting at age 12 years through age 56 or 98 years with 3 consecutive normal Pap tests. Testing can be stopped between 65 and 70 years with 3 consecutive normal Pap tests and no abnormal Pap or HPV tests in the past 10 years.  Fecal occult blood test (FOBT) of stool. / Every year beginning at age 58 years and continuing until age 54 years. You may not need to do  this test if you get a colonoscopy every 10 years.  Flexible sigmoidoscopy or colonoscopy.** / Every 5 years for a flexible sigmoidoscopy or every 10 years for a colonoscopy beginning at age 69 years and continuing until age 56 years.  Hepatitis C blood test.** / For all people born from [birth year] through 1963 and any individual with known risks for hepatitis C.  Osteoporosis screening.** / A one-time screening for women ages 95 years and over and women at risk for fractures or osteoporosis.  Skin self-exam. / Monthly.  Influenza vaccine. / Every year.  Tetanus, diphtheria, and acellular pertussis (Tdap/Td) vaccine.** / 1 dose of Td every 10 years.  Zoster vaccine.** / 1 dose for adults aged 8 years or older.  Pneumococcal 13-valent conjugate (PCV13) vaccine.** / Consult your health care provider.  Pneumococcal polysaccharide (PPSV23) vaccine.** / 1 dose for all adults aged 63 years and older. Screening for abdominal aortic aneurysm (AAA)  by ultrasound is recommended for people who have history of high blood pressure or who are current or former smokers. ++++++++++++++++++++ Recommend Adult Low Dose Aspirin or  coated  Aspirin  81 mg daily  To reduce risk of Colon Cancer 40 %,  Skin Cancer 26 % ,  Melanoma 46%  and  Pancreatic cancer 60% ++++++++++++++++++++ Vitamin D goal  is between 70-100.  Please make sure that you are taking your Vitamin D as directed.  It is very important as a natural anti-inflammatory  helping hair, skin, and nails, as well as reducing stroke and heart attack risk.  It helps your bones and helps with mood. It also decreases numerous cancer risks so please take it as directed.  Low Vit D is associated with a 200-300% higher risk for CANCER  and 200-300% higher risk for HEART   ATTACK  &  STROKE.   .....................................Marland Kitchen It is also associated with higher death rate at younger ages,  autoimmune diseases like Rheumatoid arthritis, Lupus,  Multiple Sclerosis.    Also many other serious conditions, like depression, Alzheimer's Dementia, infertility, muscle aches, fatigue, fibromyalgia - just to name a few. ++++++++++++++++++ Recommend the book "The END of DIETING" by Dr Excell Seltzer  & the book "The END of DIABETES " by Dr Excell Seltzer At [email or web address] - get book & Audio CD's    Being diabetic has a  300% increased risk for heart attack, stroke, cancer, and alzheimer- type vascular dementia. It is very important that you work harder with diet by avoiding all foods that are white. Avoid white rice (brown & wild rice is OK), white potatoes (sweetpotatoes in moderation is OK), White bread or wheat bread or anything made out of white flour like bagels, donuts, rolls, buns, biscuits, cakes, pastries, cookies, pizza crust, and pasta (made from white flour & egg whites) - vegetarian pasta or spinach or wheat pasta is OK. Multigrain breads like Arnold's or [employer], or multigrain sandwich thins or flatbreads.  Diet, exercise and weight loss can reverse and cure diabetes in the early stages.  Diet, exercise and weight loss is very important in the control and prevention of complications of diabetes which affects every system in your body, ie. Brain - dementia/stroke, eyes - glaucoma/blindness, heart - heart attack/heart failure, kidneys - dialysis, stomach - gastric paralysis, intestines - malabsorption, nerves - severe painful neuritis, circulation - gangrene & loss of a leg(s), and finally cancer and Alzheimers.    I recommend avoid fried & greasy foods,  sweets/candy, white rice (brown or wild rice or Quinoa is OK), white potatoes (sweet potatoes are OK) - anything made from white flour - bagels, doughnuts, rolls, buns, biscuits,white and wheat breads, pizza crust and traditional pasta made of white flour & egg white(vegetarian pasta or spinach or wheat pasta is OK).  Multi-grain bread is OK - like multi-grain flat bread or sandwich thins. Avoid  alcohol in excess. Exercise is also important.    Eat all the vegetables you want - avoid meat, especially red meat and dairy - especially cheese.  Cheese is the most concentrated form of trans-fats which is the worst thing to clog up our arteries. Veggie cheese is OK which can be found in the fresh produce section at [employer] or [employer] or [employer]  +++++++++++++++++++ DASH Eating Plan  DASH stands for "Dietary Approaches to Stop Hypertension."   The DASH eating plan is a healthy eating plan that has been shown to reduce high blood pressure (hypertension). Additional health benefits may include reducing the risk of type 2 diabetes mellitus, heart disease, and stroke. The DASH eating plan may also help with weight loss. WHAT DO I NEED TO  KNOW ABOUT THE DASH EATING PLAN? For the DASH eating plan, you will follow these general guidelines:  Choose foods with a percent daily value for sodium of less than 5% (as listed on the food label).  Use salt-free seasonings or herbs instead of table salt or sea salt.  Check with your health care provider or pharmacist before using salt substitutes.  Eat lower-sodium products, often labeled as "lower sodium" or "no salt added."  Eat fresh foods.  Eat more vegetables, fruits, and low-fat dairy products.  Choose whole grains. Look for the word "whole" as the first word in the ingredient list.  Choose fish   Limit sweets, desserts, sugars, and sugary drinks.  Choose heart-healthy fats.  Eat veggie cheese   Eat more home-cooked food and less restaurant, buffet, and fast food.  Limit fried foods.  Cook foods using methods other than frying.  Limit canned vegetables. If you do use them, rinse them well to decrease the sodium.  When eating at a restaurant, ask that your food be prepared with less salt, or no salt if possible.                      WHAT FOODS CAN I EAT? Read Dr Fara Olden Fuhrman's books on The End of Dieting & The End of  Diabetes  Grains Whole grain or whole wheat bread. Brown rice. Whole grain or whole wheat pasta. Quinoa, bulgur, and whole grain cereals. Low-sodium cereals. Corn or whole wheat flour tortillas. Whole grain cornbread. Whole grain crackers. Low-sodium crackers.  Vegetables Fresh or frozen vegetables (raw, steamed, roasted, or grilled). Low-sodium or reduced-sodium tomato and vegetable juices. Low-sodium or reduced-sodium tomato sauce and paste. Low-sodium or reduced-sodium canned vegetables.   Fruits All fresh, canned (in natural juice), or frozen fruits.  Protein Products  All fish and seafood.  Dried beans, peas, or lentils. Unsalted nuts and seeds. Unsalted canned beans.  Dairy Low-fat dairy products, such as skim or 1% milk, 2% or reduced-fat cheeses, low-fat ricotta or cottage cheese, or plain low-fat yogurt. Low-sodium or reduced-sodium cheeses.  Fats and Oils Tub margarines without trans fats. Light or reduced-fat mayonnaise and salad dressings (reduced sodium). Avocado. Safflower, olive, or canola oils. Natural peanut or almond butter.  Other Unsalted popcorn and pretzels. The items listed above may not be a complete list of recommended foods or beverages. Contact your dietitian for more options.  +++++++++++++++  WHAT FOODS ARE NOT RECOMMENDED? Grains/ White flour or wheat flour White bread. White pasta. White rice. Refined cornbread. Bagels and croissants. Crackers that contain trans fat.  Vegetables  Creamed or fried vegetables. Vegetables in a . Regular canned vegetables. Regular canned tomato sauce and paste. Regular tomato and vegetable juices.  Fruits Dried fruits. Canned fruit in light or heavy syrup. Fruit juice.  Meat and Other Protein Products Meat in general - RED meat & White meat.  Fatty cuts of meat. Ribs, chicken wings, all processed meats as bacon, sausage, bologna, salami, fatback, hot dogs, bratwurst and packaged luncheon meats.  Dairy Whole or 2%  milk, cream, half-and-half, and cream cheese. Whole-fat or sweetened yogurt. Full-fat cheeses or blue cheese. Non-dairy creamers and whipped toppings. Processed cheese, cheese spreads, or cheese curds.  Condiments Onion and garlic salt, seasoned salt, table salt, and sea salt. Canned and packaged gravies. Worcestershire sauce. Tartar sauce. Barbecue sauce. Teriyaki sauce. Soy sauce, including reduced sodium. Steak sauce. Fish sauce. Oyster sauce. Cocktail sauce. Horseradish. Ketchup and mustard. Meat flavorings and tenderizers.  Bouillon cubes. Hot sauce. Tabasco sauce. Marinades. Taco seasonings. Relishes.  Fats and Oils Butter, stick margarine, lard, shortening and bacon fat. Coconut, palm kernel, or palm oils. Regular salad dressings.  Pickles and olives. Salted popcorn and pretzels.  The items listed above may not be a complete list of foods and beverages to avoid.

## 2018-12-18 NOTE — Progress Notes (Signed)
Comprehensive Evaluation &  Examination  This very nice 85 y.o. [name] presents for a  comprehensive evaluation and management of multiple medical co-morbidities.  Patient has been followed for labile HTN, HLD, Hypothyroidism, Prediabetes, IBS-D and Vitamin D Deficiency.      Patient was recently dx's 2 weeks ago by Dr Cy Blamer with PMR and patient is on a very slow prednisone taper.       Patient has hx/o mild labile HTN being followed expectantly. Patient's BP has been controlled at home and patient denies any cardiac symptoms as chest pain, palpitations, shortness of breath, dizziness or ankle swelling. Today's BP was initially sl elevated & rechecked at goal - 140/84.      Patient's hyperlipidemia is controlled with diet and medications. Patient denies myalgias or other medication SE's. Last lipids were at goal:  Lab Results  Component Value Date   CHOL 176 09/13/2018   HDL 67 09/13/2018   LDLCALC 88 09/13/2018   TRIG 117 09/13/2018   CHOLHDL 2.6 09/13/2018        Patient was dx'd Hypothyroid in 2000 and has been on thyroid replacement since.         Patient his monitored expectantly for glucose intolerance  and patient denies reactive hypoglycemic symptoms, visual blurring, diabetic polys or paresthesias. Last A1c was Normal & at goal:  Lab Results  Component Value Date   HGBA1C 5.2 12/01/2017      Finally, patient has history of Vitamin D Deficiency("37" / 2011)  and last Vitamin D was At goal:  Lab Results  Component Value Date   VD25OH 95 12/01/2017   Current Outpatient Medications on File Prior to Visit  Medication Sig  . Acetaminophen (TYLENOL PO) Take by mouth 3 (three) times daily.  Marland Kitchen aspirin (ASPIRIN 81) 81 MG EC tablet Take 1 tablet daily  . Cholecalciferol (VITAMIN D PO) Take 5,000 Units by mouth 2 (two) times daily.  . clotrimazole-betamethasone (LOTRISONE) cream Apply 1 application topically 2 (two) times daily. (Patient taking differently: Apply 1  application topically as needed. )  . diazepam (VALIUM) 5 MG tablet Take 1/2 to 1 tablet 2 to 3 x /day ONLY if needed for Anxiety Attack  . dicyclomine (BENTYL) 20 MG tablet Take 1/2 to 1 tablet 3 x /day before meals for IBS  . Ibuprofen (ADVIL PO) Take by mouth 3 (three) times daily.   Marland Kitchen levothyroxine (SYNTHROID) 150 MCG tablet TAKE 1 TABLET BY MOUTH  DAILY  . meloxicam (MOBIC) 15 MG tablet Take one daily with food for 2 weeks, can take with tylenol, can not take with aleve, iburpofen, then as needed daily for pain  . Potassium Gluconate 595 MG CAPS Take by mouth daily.  . predniSONE (DELTASONE) 5 MG tablet Take 3 tablets (15 mg total) by mouth daily with breakfast.  . Probiotic Product (PROBIOTIC PO) Take 1 tablet by mouth daily.   No current facility-administered medications on file prior to visit.    Allergies  Allergen Reactions  . Ciprofloxacin   . Epinephrine Other (See Comments)    Heart raced   . Etodolac Hives  . Synephrine     Palpitations   . Flagyl [Metronidazole Hcl] Rash   Past Medical History:  Diagnosis Date  . Anxiety   . Breast cancer ([facility])    left  . Colon polyp 2009   BENIGN POLYPOID  . Diverticulosis of colon (without mention of hemorrhage) 2009  . DJD (degenerative joint disease)   . Family history  of malignant neoplasm of gastrointestinal tract 05/06/2011  . HOH (hard of hearing)    Right ear  . Hypercholesteremia   . Hypothyroidism   . IBS (irritable bowel syndrome)   . Internal hemorrhoids without mention of complication   . Primary osteoarthritis of right knee 11/26/2015   S/p TKA  . Sinus drainage   . Vitiligo   . Wears dentures    top  . Wears glasses    Health Maintenance  Topic Date Due  . TETANUS/TDAP  10/18/2022  . INFLUENZA VACCINE  Completed  . DEXA SCAN  Completed  . PNA vac Low Risk Adult  Completed   Immunization History  Administered Date(s) Administered  . Influenza Split 12/01/2010  . Influenza, High Dose Seasonal PF  11/12/2015, 11/23/2016, 12/01/2017, 12/06/2018  . Influenza-Unspecified 10/31/2012, 11/29/2014  . Pneumococcal Conjugate-13 10/18/2013  . Pneumococcal Polysaccharide-23 03/20/2010  . Td 10/17/2012   Last Colon -  Last MGM -  Past Surgical History:  Procedure Laterality Date  . ABDOMINAL HYSTERECTOMY    . bladder tac    . BREAST LUMPECTOMY  2004   left lump-snbx  . BUNIONECTOMY     bilateral  . COLONOSCOPY  2009   several  . CYSTOCELE REPAIR  2009  . EYE SURGERY Bilateral    Cataract removal  . KNEE ARTHROSCOPY Left 01/17/2013   Procedure: ARTHROSCOPY LEFT KNEE;  Surgeon: Hessie Dibble, MD;  Location: [facility];  Service: Orthopedics;  Laterality: Left;  partial medial and partial lateral chondroplasty and removal loose body  . right shoulder  2003   bone spur removed-rcr  . TONSILLECTOMY    . TOTAL KNEE ARTHROPLASTY Right 11/26/2015   Procedure: TOTAL KNEE ARTHROPLASTY;  Surgeon: Melrose Nakayama, MD;  Location: [facility];  Service: Orthopedics;  Laterality: Right;  . WRIST FRACTURE SURGERY  2003   left-lipoma   Family History  Problem Relation Age of Onset  . Alzheimer's disease Sister   . Cancer Sister 107       breast  . Breast cancer Sister   . Cancer Sister 83       breast cancer  . Colon cancer Son   . Heart disease Mother        died at age 103  . Alzheimer's disease Mother   . Testicular cancer Father    Social History   Tobacco Use  . Smoking status: Never Smoker  . Smokeless tobacco: Never Used  Substance Use Topics  . Alcohol use: Yes    Comment: ocassionally  . Drug use: No    ROS Constitutional: Denies fever, chills, weight loss/gain, headaches, insomnia,  night sweats, and change in appetite. Does c/o fatigue. Eyes: Denies redness, blurred vision, diplopia, discharge, itchy, watery eyes.  ENT: Denies discharge, congestion, post nasal drip, epistaxis, sore throat, earache, hearing loss, dental pain, Tinnitus, Vertigo, Sinus pain,  snoring.  Cardio: Denies chest pain, palpitations, irregular heartbeat, syncope, dyspnea, diaphoresis, orthopnea, PND, claudication, edema Respiratory: denies cough, dyspnea, DOE, pleurisy, hoarseness, laryngitis, wheezing.  Gastrointestinal: Denies dysphagia, heartburn, reflux, water brash, pain, cramps, nausea, vomiting, bloating, diarrhea, constipation, hematemesis, melena, hematochezia, jaundice, hemorrhoids Genitourinary: Denies dysuria, frequency, urgency, nocturia, hesitancy, discharge, hematuria, flank pain Breast: Breast lumps, nipple discharge, bleeding.  Musculoskeletal: Denies arthralgia, myalgia, stiffness, Jt. Swelling, pain, limp, and strain/sprain. Denies falls. Skin: Denies puritis, rash, hives, warts, acne, eczema, changing in skin lesion Neuro: No weakness, tremor, incoordination, spasms, paresthesia, pain Psychiatric: Denies confusion, memory loss, sensory loss. Denies Depression. Endocrine: Denies  change in weight, skin, hair change, nocturia, and paresthesia, diabetic polys, visual blurring, hyper / hypo glycemic episodes.  Heme/Lymph: No excessive bleeding, bruising, enlarged lymph nodes.  Physical Exam  BP 140/84   Pulse 72   Temp (!) 97 F (36.1 C)   Resp 16   Ht 5\' 4"  (1.626 m)   Wt 142 lb (64.4 kg)   BMI 24.37 kg/m   General Appearance: Well nourished, well groomed and in no apparent distress.  Eyes: PERRLA, EOMs, conjunctiva no swelling or erythema, normal fundi and vessels. Sinuses: No frontal/maxillary tenderness ENT/Mouth: EACs patent / TMs  nl. Nares clear without erythema, swelling, mucoid exudates. Oral hygiene is good. No erythema, swelling, or exudate. Tongue normal, non-obstructing. Tonsils not swollen or erythematous. Hearing normal.  Neck: Supple, thyroid not palpable. No bruits, nodes or JVD. Respiratory: Respiratory effort normal.  BS equal and clear bilateral without rales, rhonci, wheezing or stridor. Cardio: Heart sounds are normal with  regular rate and rhythm and no murmurs, rubs or gallops. Peripheral pulses are normal and equal bilaterally without edema. No aortic or femoral bruits. Chest: symmetric with normal excursions and percussion. Breasts: Symmetric, without lumps, nipple discharge, retractions, or fibrocystic changes.  Abdomen: Flat, soft with bowel sounds active. Nontender, no guarding, rebound, hernias, masses, or organomegaly.  Lymphatics: Non tender without lymphadenopathy.  Genitourinary:  Musculoskeletal: Full ROM all peripheral extremities, joint stability, 5/5 strength, and normal gait. Skin: Warm and dry without rashes, lesions, cyanosis, clubbing or  ecchymosis.  Neuro: Cranial nerves intact, reflexes equal bilaterally. Normal muscle tone, no cerebellar symptoms. Sensation intact.  Pysch: Alert and oriented X 3, normal affect, Insight and Judgment appropriate.   Assessment and Plan  1. Labile hypertension  - EKG 12-Lead - Urinalysis, Routine w reflex microscopic - Microalbumin / creatinine urine ratio - CBC with Differential/Platelet - COMPLETE METABOLIC PANEL WITH GFR - Magnesium - TSH  2. Hyperlipidemia, mixed  - EKG 12-Lead - Lipid panel - TSH  3. Abnormal glucose  - EKG 12-Lead - Hemoglobin A1c - Insulin, random  4. Vitamin D deficiency  - VITAMIN D 25 Hydroxyl  5. PMR (polymyalgia rheumatica) ([facility])   6. Hypothyroidism  - TSH  7. Screening for colorectal cancer  - POC Hemoccult Bld/Stl  8. Screening for ischemic heart disease  - EKG 12-Lead  9. FHx: heart disease  - EKG 12-Lead  10. Medication management  - EKG 12-Lead - Urinalysis, Routine w reflex microscopic - Microalbumin / creatinine urine ratio - CBC with Differential/Platelet - COMPLETE METABOLIC PANEL WITH GFR - Magnesium - Lipid panel - TSH - Hemoglobin A1c - Insulin, random - VITAMIN D 25 Hydroxyl        Patient was counseled in prudent diet to achieve/maintain BMI less than 25 for weight  control, BP monitoring, regular exercise and medications. Discussed med's effects and SE's. Screening labs and tests as requested with regular follow-up as recommended. Over 40 minutes of exam, counseling, chart review and high complex critical decision making was performed.   Kirtland Bouchard, MD

## 2018-12-19 ENCOUNTER — Other Ambulatory Visit: Payer: Self-pay

## 2018-12-19 ENCOUNTER — Ambulatory Visit (INDEPENDENT_AMBULATORY_CARE_PROVIDER_SITE_OTHER): Payer: Medicare Other | Admitting: Internal Medicine

## 2018-12-19 ENCOUNTER — Encounter: Payer: Self-pay | Admitting: Internal Medicine

## 2018-12-19 ENCOUNTER — Other Ambulatory Visit: Payer: Self-pay | Admitting: Internal Medicine

## 2018-12-19 VITALS — BP 140/84 | HR 72 | Temp 97.0°F | Resp 16 | Ht 64.0 in | Wt 142.0 lb

## 2018-12-19 DIAGNOSIS — Z79899 Other long term (current) drug therapy: Secondary | ICD-10-CM

## 2018-12-19 DIAGNOSIS — R0989 Other specified symptoms and signs involving the circulatory and respiratory systems: Secondary | ICD-10-CM

## 2018-12-19 DIAGNOSIS — E559 Vitamin D deficiency, unspecified: Secondary | ICD-10-CM

## 2018-12-19 DIAGNOSIS — Z8249 Family history of ischemic heart disease and other diseases of the circulatory system: Secondary | ICD-10-CM

## 2018-12-19 DIAGNOSIS — R7309 Other abnormal glucose: Secondary | ICD-10-CM | POA: Diagnosis not present

## 2018-12-19 DIAGNOSIS — E039 Hypothyroidism, unspecified: Secondary | ICD-10-CM | POA: Diagnosis not present

## 2018-12-19 DIAGNOSIS — E782 Mixed hyperlipidemia: Secondary | ICD-10-CM | POA: Diagnosis not present

## 2018-12-19 DIAGNOSIS — Z136 Encounter for screening for cardiovascular disorders: Secondary | ICD-10-CM | POA: Diagnosis not present

## 2018-12-19 DIAGNOSIS — M353 Polymyalgia rheumatica: Secondary | ICD-10-CM | POA: Diagnosis not present

## 2018-12-19 DIAGNOSIS — Z1212 Encounter for screening for malignant neoplasm of rectum: Secondary | ICD-10-CM

## 2018-12-19 DIAGNOSIS — Z1211 Encounter for screening for malignant neoplasm of colon: Secondary | ICD-10-CM

## 2018-12-19 MED ORDER — LEVOTHYROXINE SODIUM 75 MCG PO TABS: ORAL_TABLET | ORAL | 3 refills | Status: DC

## 2018-12-19 MED ORDER — DIAZEPAM 5 MG PO TABS: ORAL_TABLET | ORAL | 0 refills | Status: DC

## 2018-12-19 NOTE — Addendum Note (Signed)
Addended by: Unk Pinto on: 12/19/2018 11:27 PM   Modules accepted: Orders

## 2018-12-20 LAB — CBC WITH DIFFERENTIAL/PLATELET
Absolute Monocytes: 1094 cells/uL — ABNORMAL HIGH (ref 200–950)
Basophils Absolute: 29 cells/uL (ref 0–200)
Basophils Relative: 0.2 %
Eosinophils Absolute: 14 cells/uL — ABNORMAL LOW (ref 15–500)
Eosinophils Relative: 0.1 %
HCT: 36.1 % (ref 35.0–45.0)
Hemoglobin: 12.4 g/dL (ref 11.7–15.5)
Lymphs Abs: 1901 cells/uL (ref 850–3900)
MCH: 29.1 pg (ref 27.0–33.0)
MCHC: 34.3 g/dL (ref 32.0–36.0)
MCV: 84.7 fL (ref 80.0–100.0)
MPV: 9.5 fL (ref 7.5–12.5)
Monocytes Relative: 7.6 %
Neutro Abs: 11362 cells/uL — ABNORMAL HIGH (ref 1500–7800)
Neutrophils Relative %: 78.9 %
Platelets: 385 10*3/uL (ref 140–400)
RBC: 4.26 10*6/uL (ref 3.80–5.10)
RDW: 14.2 % (ref 11.0–15.0)
Total Lymphocyte: 13.2 %
WBC: 14.4 10*3/uL — ABNORMAL HIGH (ref 3.8–10.8)

## 2018-12-20 LAB — COMPLETE METABOLIC PANEL WITH GFR
AG Ratio: 1.8 (calc) (ref 1.0–2.5)
ALT: 24 U/L (ref 6–29)
AST: 14 U/L (ref 10–35)
Albumin: 3.9 g/dL (ref 3.6–5.1)
Alkaline phosphatase (APISO): 62 U/L (ref 37–153)
BUN: 24 mg/dL (ref 7–25)
CO2: 23 mmol/L (ref 20–32)
Calcium: 10 mg/dL (ref 8.6–10.4)
Chloride: 107 mmol/L (ref 98–110)
Creat: 0.81 mg/dL (ref 0.60–0.88)
GFR, Est African American: 74 mL/min/{1.73_m2} (ref >=60)
GFR, Est Non African American: 63 mL/min/{1.73_m2} (ref >=60)
Globulin: 2.2 g/dL (calc) (ref 1.9–3.7)
Glucose, Bld: 136 mg/dL — ABNORMAL HIGH (ref 65–99)
Potassium: 4.2 mmol/L (ref 3.5–5.3)
Sodium: 140 mmol/L (ref 135–146)
Total Bilirubin: 0.7 mg/dL (ref 0.2–1.2)
Total Protein: 6.1 g/dL (ref 6.1–8.1)

## 2018-12-20 LAB — URINALYSIS, ROUTINE W REFLEX MICROSCOPIC
Bilirubin Urine: NEGATIVE
Glucose, UA: NEGATIVE
Hgb urine dipstick: NEGATIVE
Ketones, ur: NEGATIVE
Leukocytes,Ua: NEGATIVE
Nitrite: NEGATIVE
Protein, ur: NEGATIVE
Specific Gravity, Urine: 1.018 (ref 1.001–1.03)
pH: 5.5 (ref 5.0–8.0)

## 2018-12-20 LAB — HEMOGLOBIN A1C
Hgb A1c MFr Bld: 5.8 % of total Hgb — ABNORMAL HIGH (ref <5.7)
Mean Plasma Glucose: 120 (calc)
eAG (mmol/L): 6.6 (calc)

## 2018-12-20 LAB — INSULIN, RANDOM: Insulin: 35 u[IU]/mL — ABNORMAL HIGH

## 2018-12-20 LAB — VITAMIN D 25 HYDROXY (VIT D DEFICIENCY, FRACTURES): Vit D, 25-Hydroxy: 102 ng/mL — ABNORMAL HIGH (ref 30–100)

## 2018-12-20 LAB — TSH: TSH: 1.06 mIU/L (ref 0.40–4.50)

## 2018-12-20 LAB — LIPID PANEL
Cholesterol: 208 mg/dL — ABNORMAL HIGH (ref <200)
HDL: 77 mg/dL (ref >=50)
LDL Cholesterol (Calc): 111 mg/dL (calc) — ABNORMAL HIGH
Non-HDL Cholesterol (Calc): 131 mg/dL (calc) — ABNORMAL HIGH (ref <130)
Total CHOL/HDL Ratio: 2.7 (calc) (ref <5.0)
Triglycerides: 98 mg/dL (ref <150)

## 2018-12-20 LAB — MICROALBUMIN / CREATININE URINE RATIO
Creatinine, Urine: 105 mg/dL (ref 20–275)
Microalb Creat Ratio: 5 mcg/mg creat (ref <30)
Microalb, Ur: 0.5 mg/dL

## 2018-12-20 LAB — MAGNESIUM: Magnesium: 2.1 mg/dL (ref 1.5–2.5)

## 2018-12-21 ENCOUNTER — Encounter: Payer: Self-pay | Admitting: *Deleted

## 2018-12-21 NOTE — Progress Notes (Signed)
Office Visit Note  Patient: Alexandria Davies             Date of Birth: December 29, 1925           MRN: [id number]             PCP: Unk Pinto, MD Referring: Unk Pinto, MD Visit Date: 01/04/2019 Occupation: @GUAROCC @  Subjective:  Medication management   History of Present Illness: Alexandria Davies is a 85 y.o. female with history of polymyalgia and osteoarthritis.  She reduced her dose of prednisone to 15 mg p.o. daily at after the last visit.  She has done really well without any increased joint pain or joint swelling.  She has no increased muscle weakness or tenderness.  She continues to have some discomfort in her knee joints.  Her right knee joint is replaced.  Activities of Daily Living:  Patient reports morning stiffness for 0 NONE.   Patient Denies nocturnal pain.  Difficulty dressing/grooming: Denies Difficulty climbing stairs: Reports Difficulty getting out of chair: Denies Difficulty using hands for taps, buttons, cutlery, and/or writing: Reports  Review of Systems  Constitutional: Positive for fatigue. Negative for night sweats, weight gain and weight loss.  HENT: Positive for mouth dryness. Negative for mouth sores, trouble swallowing, trouble swallowing and nose dryness.   Eyes: Positive for dryness. Negative for pain, redness and visual disturbance.  Respiratory: Negative for cough, shortness of breath and difficulty breathing.   Cardiovascular: Negative for chest pain, palpitations, hypertension, irregular heartbeat and swelling in legs/feet.  Gastrointestinal: Negative for blood in stool, constipation and diarrhea.  Endocrine: Negative for cold intolerance and increased urination.  Genitourinary: Negative for difficulty urinating and vaginal dryness.  Musculoskeletal: Positive for arthralgias, joint pain and muscle weakness. Negative for gait problem, joint swelling, myalgias, morning stiffness, muscle tenderness and myalgias.  Skin: Negative for color change,  rash, hair loss, skin tightness, ulcers and sensitivity to sunlight.  Allergic/Immunologic: Negative for susceptible to infections.  Neurological: Negative for dizziness, numbness, memory loss, night sweats and weakness.  Hematological: Negative for bruising/bleeding tendency and swollen glands.  Psychiatric/Behavioral: Negative for depressed mood and sleep disturbance. The patient is not nervous/anxious.     PMFS History:  Patient Active Problem List   Diagnosis Date Noted  . Arthralgia 10/26/2018  . Insomnia 10/22/2014  . Generalized anxiety disorder 10/22/2014  . Labile hypertension 02/14/2014  . Mixed hyperlipidemia 02/14/2014  . Vitamin D deficiency 02/14/2014  . Rhinitis, allergic 08/19/2011  . Diverticulosis 04/23/2011  . Colon polyps 04/23/2011  . PERSONAL HISTORY OF MALIGNANT NEOPLASM OF BREAST 12/07/2007  . Hypothyroidism 12/05/2007  . Osteoarthritis 12/05/2007  . Internal hemorrhoids 11/08/2002    Past Medical History:  Diagnosis Date  . Anxiety   . Breast cancer ([facility])    left  . Colon polyp 2009   BENIGN POLYPOID  . Diverticulosis of colon (without mention of hemorrhage) 2009  . DJD (degenerative joint disease)   . Family history of malignant neoplasm of gastrointestinal tract 05/06/2011  . HOH (hard of hearing)    Right ear  . Hypercholesteremia   . Hypothyroidism   . IBS (irritable bowel syndrome)   . Internal hemorrhoids without mention of complication   . Primary osteoarthritis of right knee 11/26/2015   S/p TKA  . Sinus drainage   . Vitiligo   . Wears dentures    top  . Wears glasses     Family History  Problem Relation Age of Onset  . Alzheimer's disease Sister   .  Cancer Sister 56       breast  . Breast cancer Sister   . Cancer Sister 73       breast cancer  . Colon cancer Son   . Heart disease Mother        died at age 46  . Alzheimer's disease Mother   . Testicular cancer Father    Past Surgical History:  Procedure Laterality Date  .  ABDOMINAL HYSTERECTOMY    . bladder tac    . BREAST LUMPECTOMY  2004   left lump-snbx  . BUNIONECTOMY     bilateral  . COLONOSCOPY  2009   several  . CYSTOCELE REPAIR  2009  . EYE SURGERY Bilateral    Cataract removal  . KNEE ARTHROSCOPY Left 01/17/2013   Procedure: ARTHROSCOPY LEFT KNEE;  Surgeon: Hessie Dibble, MD;  Location: [facility];  Service: Orthopedics;  Laterality: Left;  partial medial and partial lateral chondroplasty and removal loose body  . right shoulder  2003   bone spur removed-rcr  . TONSILLECTOMY    . TOTAL KNEE ARTHROPLASTY Right 11/26/2015   Procedure: TOTAL KNEE ARTHROPLASTY;  Surgeon: Melrose Nakayama, MD;  Location: [facility];  Service: Orthopedics;  Laterality: Right;  . WRIST FRACTURE SURGERY  2003   left-lipoma   Social History   Social History Narrative  . Not on file   Immunization History  Administered Date(s) Administered  . Influenza Split 12/01/2010  . Influenza, High Dose Seasonal PF 11/12/2015, 11/23/2016, 12/01/2017, 12/06/2018  . Influenza-Unspecified 10/31/2012, 11/29/2014  . Pneumococcal Conjugate-13 10/18/2013  . Pneumococcal Polysaccharide-23 03/20/2010  . Td 10/17/2012     Objective: Vital Signs: BP (!) 142/86 (BP Location: Left Arm, Patient Position: Sitting, Cuff Size: Normal)   Pulse 71   Resp 16   Ht 5' 4.5" (1.638 m)   Wt 145 lb (65.8 kg)   BMI 24.50 kg/m    Physical Exam Vitals signs and nursing note reviewed.  Constitutional:      Appearance: She is well-developed.  HENT:     Head: Normocephalic and atraumatic.  Eyes:     Conjunctiva/sclera: Conjunctivae normal.  Neck:     Musculoskeletal: Normal range of motion.  Cardiovascular:     Rate and Rhythm: Normal rate and regular rhythm.     Heart sounds: Normal heart sounds.  Pulmonary:     Effort: Pulmonary effort is normal.     Breath sounds: Normal breath sounds.  Abdominal:     General: Bowel sounds are normal.     Palpations: Abdomen is soft.   Lymphadenopathy:     Cervical: No cervical adenopathy.  Skin:    General: Skin is warm and dry.     Capillary Refill: Capillary refill takes less than 2 seconds.  Neurological:     Mental Status: She is alert and oriented to person, place, and time.  Psychiatric:        Behavior: Behavior normal.      Musculoskeletal Exam: C-spine was in good range of motion.  Shoulder joints elbow joints wrist joints with good range of motion.  She has no synovitis over MCPs PIPs or DIPs.  Hip joints with good range of motion.  Her right total knee replacement is doing well.  She has some discomfort range of motion of her left knee joint.  Ankle joints MTPs PIPs with good range of motion with no synovitis.  CDAI Exam: CDAI Score: - Patient Global: -; Provider Global: - Swollen: -; Tender: -  Joint Exam   No joint exam has been documented for this visit   There is currently no information documented on the homunculus. Go to the Rheumatology activity and complete the homunculus joint exam.  Investigation: No additional findings.  Imaging: No results found.  Recent Labs: Lab Results  Component Value Date   WBC 14.4 (H) 12/19/2018   HGB 12.4 12/19/2018   PLT 385 12/19/2018   NA 140 12/19/2018   K 4.2 12/19/2018   CL 107 12/19/2018   CO2 23 12/19/2018   GLUCOSE 136 (H) 12/19/2018   BUN 24 12/19/2018   CREATININE 0.81 12/19/2018   BILITOT 0.7 12/19/2018   ALKPHOS 62 04/23/2016   AST 14 12/19/2018   ALT 24 12/19/2018   PROT 6.1 12/19/2018   ALBUMIN 4.2 04/23/2016   CALCIUM 10.0 12/19/2018   GFRAA 74 12/19/2018    Speciality Comments: No specialty comments available.  Procedures:  No procedures performed Allergies: Ciprofloxacin, Epinephrine, Etodolac, Synephrine, and Flagyl [metronidazole hcl]   Assessment / Plan:     Visit Diagnoses: Polymyalgia rheumatica ([facility]) - Patient had intermittent courses of prednisone in the past.  She was placed on prednisone 15 mg p.o. daily for 2  weeks and then taper to 10 mg qdx1 mth.  Patient is a still on 15 mg of prednisone a day.  She states she will taper it to 10 mg next week.  I have advised her to stay on 10 mg of prednisone for the next 2 months and then we will taper it by 1 mg every 2 months.  I detailed discussion with the patient regarding methotrexate and steroid sparing agent but she declined.  High risk medication use -patient has been on long-term prednisone.  Side effects of prednisone were discussed at length.  Elevated C-reactive protein (CRP)  Primary osteoarthritis of left knee-she has chronic pain.  Status post total left knee replacement-doing well.  Ischial bursitis, unspecified laterality-partial bursitis has improved.  She has been using a cushion to sit.  Positive ANA (antinuclear antibody) - ANA 1: 80 NH, patient had no clinical features of autoimmune disease.  Osteopenia of multiple sites-I reviewed her last bone density was from March 2018.  She needs a repeat bone density.  I also discussed the use of bisphosphonates with steroids to prevent osteoporosis but she declined.  Vitamin D deficiency-she takes vitamin D supplement.  Dyslipidemia  History of hypothyroidism  History of anxiety  Orders: No orders of the defined types were placed in this encounter.  No orders of the defined types were placed in this encounter.    Follow-Up Instructions: Return in about 2 months (around 03/06/2019) for Polymyalgia rheumatica, Osteoarthritis.   Bo Merino, MD  Note - This record has been created using Editor, commissioning.  Chart creation errors have been sought, but may not always  have been located. Such creation errors do not reflect on  the standard of medical care.

## 2018-12-26 ENCOUNTER — Telehealth: Payer: Self-pay

## 2018-12-26 ENCOUNTER — Telehealth: Payer: Self-pay | Admitting: *Deleted

## 2018-12-26 NOTE — Telephone Encounter (Signed)
Patient called and asked if she can restart her Simvastatin 20 mg. She had been told to stop the medication.  Per Dr Melford Aase, Itis OK to restart the Simvastatin. The patient is aware.

## 2018-12-26 NOTE — Telephone Encounter (Signed)
Patient wants to know if she should get back her cholesterol meds. This message was placed in Dr. Idell Pickles box for advice

## 2019-01-04 ENCOUNTER — Other Ambulatory Visit: Payer: Self-pay

## 2019-01-04 ENCOUNTER — Ambulatory Visit (INDEPENDENT_AMBULATORY_CARE_PROVIDER_SITE_OTHER): Payer: Medicare Other | Admitting: Rheumatology

## 2019-01-04 ENCOUNTER — Encounter: Payer: Self-pay | Admitting: Rheumatology

## 2019-01-04 VITALS — BP 142/86 | HR 71 | Resp 16 | Ht 64.5 in | Wt 145.0 lb

## 2019-01-04 DIAGNOSIS — Z8659 Personal history of other mental and behavioral disorders: Secondary | ICD-10-CM | POA: Diagnosis not present

## 2019-01-04 DIAGNOSIS — Z8639 Personal history of other endocrine, nutritional and metabolic disease: Secondary | ICD-10-CM

## 2019-01-04 DIAGNOSIS — E785 Hyperlipidemia, unspecified: Secondary | ICD-10-CM

## 2019-01-04 DIAGNOSIS — Z79899 Other long term (current) drug therapy: Secondary | ICD-10-CM | POA: Diagnosis not present

## 2019-01-04 DIAGNOSIS — E559 Vitamin D deficiency, unspecified: Secondary | ICD-10-CM | POA: Diagnosis not present

## 2019-01-04 DIAGNOSIS — Z96651 Presence of right artificial knee joint: Secondary | ICD-10-CM | POA: Diagnosis not present

## 2019-01-04 DIAGNOSIS — R768 Other specified abnormal immunological findings in serum: Secondary | ICD-10-CM

## 2019-01-04 DIAGNOSIS — M1712 Unilateral primary osteoarthritis, left knee: Secondary | ICD-10-CM | POA: Diagnosis not present

## 2019-01-04 DIAGNOSIS — M8589 Other specified disorders of bone density and structure, multiple sites: Secondary | ICD-10-CM

## 2019-01-04 DIAGNOSIS — R7982 Elevated C-reactive protein (CRP): Secondary | ICD-10-CM

## 2019-01-04 DIAGNOSIS — M7070 Other bursitis of hip, unspecified hip: Secondary | ICD-10-CM

## 2019-01-04 DIAGNOSIS — M353 Polymyalgia rheumatica: Secondary | ICD-10-CM | POA: Diagnosis not present

## 2019-01-04 MED ORDER — PREDNISONE 5 MG PO TABS: 10.0000 mg | ORAL_TABLET | Freq: Every day | ORAL | 0 refills | Status: DC

## 2019-01-04 NOTE — Addendum Note (Signed)
Addended by: Carole Binning on: 01/04/2019 12:29 PM   Modules accepted: Orders

## 2019-01-10 ENCOUNTER — Telehealth: Payer: Self-pay | Admitting: Rheumatology

## 2019-01-10 NOTE — Telephone Encounter (Signed)
Attempted to contact the patient and left message for patient to call the office. Patient will need PCP to order bone density as [facility].

## 2019-01-10 NOTE — Telephone Encounter (Signed)
Patient states doctor talked to her about getting a Bone Density at her last office visit. Patient discussed with PCP, but he told her to get an order from [place]. Please call to advise.

## 2019-01-11 NOTE — Progress Notes (Signed)
Assessment and Plan:  Alexandria Davies was seen today for referral.  Diagnoses and all orders for this visit:  Leukocytosis, unspecified type  -     CBC with Diff PMR On prednisone therapy Currently taking Deltasone 5mg , two tablets (10mg ) daily with breakfast. She is to continue this for two months. Follows with Dr Kathi Ludwig Next OV 9months  Abnormal glucose -     COMPLETE METABOLIC PANEL WITH GFR Discussed dietary and exercise modifications Taking prednisone will make sure within normal limits of this  Osteopenia of multiple sites -     DG Bone Density; Future Discussed Calcium and Vitamin D recommendations Not taking at this time  Other elevated white blood cell (WBC) count  likely related to prednisone use Last OV WBC 14 Will check CBC today     Further disposition pending results of labs. Discussed med's effects and SE's.   Over 30 minutes of exam, counseling, chart review, and critical decision making was performed.   Future Appointments  Date Time Provider [place]  01/12/2019 12:00 PM Garnet Sierras, NP [facility] None  03/07/2019 11:00 AM Ofilia Neas, PA-C [facility] None  03/22/2019 11:00 AM Liane Comber, NP [facility] None  06/27/2019 11:30 AM Unk Pinto, MD [facility] None  09/25/2019 11:15 AM Liane Comber, NP [facility] None  01/16/2020  9:00 AM Unk Pinto, MD [facility] None    ------------------------------------------------------------------------------------------------------------------   HPI 85 y.o.female presents for evaluation for bone density and follow up on luekocytosis.  She has recently been diagnosed with PMR with management of long term low dose prednisone.  She reports that she feels "fuzzy headed" while taking this medication and will be glad when she does not have to take it anymore.  She reports her pain is well controlled and over all doing well.  She is now taking two tablets dela 5mg   Past Medical History:   Diagnosis Date  . Anxiety   . Breast cancer ([place])    left  . Colon polyp 2009   BENIGN POLYPOID  . Diverticulosis of colon (without mention of hemorrhage) 2009  . DJD (degenerative joint disease)   . Family history of malignant neoplasm of gastrointestinal tract 05/06/2011  . HOH (hard of hearing)    Right ear  . Hypercholesteremia   . Hypothyroidism   . IBS (irritable bowel syndrome)   . Internal hemorrhoids without mention of complication   . Primary osteoarthritis of right knee 11/26/2015   S/p TKA  . Sinus drainage   . Vitiligo   . Wears dentures    top  . Wears glasses      Allergies  Allergen Reactions  . Ciprofloxacin   . Epinephrine Other (See Comments)    Heart raced   . Etodolac Hives  . Synephrine     Palpitations   . Flagyl [Metronidazole Hcl] Rash    Current Outpatient Medications on File Prior to Visit  Medication Sig  . Acetaminophen (TYLENOL PO) Take by mouth 3 (three) times daily.  Marland Kitchen aspirin (ASPIRIN 81) 81 MG EC tablet Take 1 tablet daily  . Cholecalciferol (VITAMIN D PO) Take 5,000 Units by mouth 2 (two) times daily.  . clotrimazole-betamethasone (LOTRISONE) cream Apply 1 application topically 2 (two) times daily. (Patient taking differently: Apply 1 application topically as needed. )  . diazepam (VALIUM) 5 MG tablet Take 1/2-1 tablet 2 - 3 x /day ONLY if needed for Anxiety Attack &  limit to 5 days /week to avoid addiction  . dicyclomine (BENTYL) 20 MG tablet  Take 1/2 to 1 tablet 3 x /day before meals for IBS (Patient not taking: Reported on 01/04/2019)  . Ibuprofen (ADVIL PO) Take by mouth as needed.   Marland Kitchen levothyroxine (SYNTHROID) 75 MCG tablet Take 1 tablet daily on an empty stomach with only water for 30 minutes & no Antacid meds, Calcium or Magnesium for 4 hours & avoid Biotin  . meloxicam (MOBIC) 15 MG tablet Take one daily with food for 2 weeks, can take with tylenol, can not take with aleve, iburpofen, then as needed daily for pain (Patient not  taking: Reported on 01/04/2019)  . Potassium Gluconate 595 MG CAPS Take by mouth daily.  . predniSONE (DELTASONE) 5 MG tablet Take 3 tablets (15 mg total) by mouth daily with breakfast.  . predniSONE (DELTASONE) 5 MG tablet Take 2 tablets (10 mg total) by mouth daily with breakfast.  . Probiotic Product (PROBIOTIC PO) Take 1 tablet by mouth daily.   No current facility-administered medications on file prior to visit.     ROS: all negative except above.   Physical Exam:  There were no vitals taken for this visit.  General Appearance: Well nourished, in no apparent distress. Eyes: PERRLA, EOMs, conjunctiva no swelling or erythema Sinuses: No Frontal/maxillary tenderness Respiratory: Respiratory effort normal, BS equal bilaterally without rales, rhonchi, wheezing or stridor.  Cardio: RRR with no MRGs. Brisk peripheral pulses without edema.   Musculoskeletal: Full ROM, 5/5 strength, normal gait.  Skin: Warm, dry without rashes, lesions, ecchymosis.  Neuro: Cranial nerves intact. Normal muscle tone, no cerebellar symptoms. Sensation intact.  Psych: Awake and oriented X 3, normal affect, Insight and Judgment appropriate.     Garnet Sierras, NP 1:10 PM [facility]

## 2019-01-12 ENCOUNTER — Encounter: Payer: Self-pay | Admitting: Adult Health Nurse Practitioner

## 2019-01-12 ENCOUNTER — Other Ambulatory Visit: Payer: Self-pay

## 2019-01-12 ENCOUNTER — Ambulatory Visit (INDEPENDENT_AMBULATORY_CARE_PROVIDER_SITE_OTHER): Payer: Medicare Other | Admitting: Adult Health Nurse Practitioner

## 2019-01-12 VITALS — BP 150/70 | HR 67 | Temp 97.0°F | Ht 64.5 in | Wt 143.0 lb

## 2019-01-12 DIAGNOSIS — M353 Polymyalgia rheumatica: Secondary | ICD-10-CM

## 2019-01-12 DIAGNOSIS — D72829 Elevated white blood cell count, unspecified: Secondary | ICD-10-CM

## 2019-01-12 DIAGNOSIS — R7309 Other abnormal glucose: Secondary | ICD-10-CM | POA: Diagnosis not present

## 2019-01-12 DIAGNOSIS — Z7952 Long term (current) use of systemic steroids: Secondary | ICD-10-CM

## 2019-01-12 DIAGNOSIS — M8589 Other specified disorders of bone density and structure, multiple sites: Secondary | ICD-10-CM | POA: Diagnosis not present

## 2019-01-12 NOTE — Patient Instructions (Signed)
You have Osteopenia, weakening of the bones.  You are due for Dexa, bone density scan.  We check this every 2 years.  HOW TO SCHEDULE Bone Density, DEXA  [facility]  7 a.m.-6:30 p.m., Monday 7 a.m.-5 p.m., Tuesday-Friday Schedule an appointment by calling [phone].  There are other locations to get your bone density, DEXA scan.  If you have a preference please let our office know.   We are checking your white blood cell count and also your glucose.  We will call you with lab results in one to three days.

## 2019-01-13 LAB — CBC WITH DIFFERENTIAL/PLATELET
Absolute Monocytes: 396 cells/uL (ref 200–950)
Basophils Absolute: 45 cells/uL (ref 0–200)
Basophils Relative: 0.4 %
Eosinophils Absolute: 45 cells/uL (ref 15–500)
Eosinophils Relative: 0.4 %
HCT: 39.6 % (ref 35.0–45.0)
Hemoglobin: 13.2 g/dL (ref 11.7–15.5)
Lymphs Abs: 1006 cells/uL (ref 850–3900)
MCH: 28.7 pg (ref 27.0–33.0)
MCHC: 33.3 g/dL (ref 32.0–36.0)
MCV: 86.1 fL (ref 80.0–100.0)
MPV: 9.6 fL (ref 7.5–12.5)
Monocytes Relative: 3.5 %
Neutro Abs: 9808 cells/uL — ABNORMAL HIGH (ref 1500–7800)
Neutrophils Relative %: 86.8 %
Platelets: 288 10*3/uL (ref 140–400)
RBC: 4.6 10*6/uL (ref 3.80–5.10)
RDW: 14.8 % (ref 11.0–15.0)
Total Lymphocyte: 8.9 %
WBC: 11.3 10*3/uL — ABNORMAL HIGH (ref 3.8–10.8)

## 2019-01-13 LAB — COMPLETE METABOLIC PANEL WITH GFR
AG Ratio: 1.8 (calc) (ref 1.0–2.5)
ALT: 15 U/L (ref 6–29)
AST: 15 U/L (ref 10–35)
Albumin: 4 g/dL (ref 3.6–5.1)
Alkaline phosphatase (APISO): 57 U/L (ref 37–153)
BUN: 18 mg/dL (ref 7–25)
CO2: 23 mmol/L (ref 20–32)
Calcium: 10.1 mg/dL (ref 8.6–10.4)
Chloride: 106 mmol/L (ref 98–110)
Creat: 0.88 mg/dL (ref 0.60–0.88)
GFR, Est African American: 67 mL/min/{1.73_m2} (ref >=60)
GFR, Est Non African American: 57 mL/min/{1.73_m2} — ABNORMAL LOW (ref >=60)
Globulin: 2.2 g/dL (calc) (ref 1.9–3.7)
Glucose, Bld: 123 mg/dL — ABNORMAL HIGH (ref 65–99)
Potassium: 4.2 mmol/L (ref 3.5–5.3)
Sodium: 140 mmol/L (ref 135–146)
Total Bilirubin: 1.1 mg/dL (ref 0.2–1.2)
Total Protein: 6.2 g/dL (ref 6.1–8.1)

## 2019-02-13 DIAGNOSIS — Z961 Presence of intraocular lens: Secondary | ICD-10-CM | POA: Diagnosis not present

## 2019-02-13 DIAGNOSIS — D3131 Benign neoplasm of right choroid: Secondary | ICD-10-CM | POA: Diagnosis not present

## 2019-02-13 DIAGNOSIS — H26492 Other secondary cataract, left eye: Secondary | ICD-10-CM | POA: Diagnosis not present

## 2019-02-13 DIAGNOSIS — D3132 Benign neoplasm of left choroid: Secondary | ICD-10-CM | POA: Diagnosis not present

## 2019-02-13 DIAGNOSIS — H0011 Chalazion right upper eyelid: Secondary | ICD-10-CM | POA: Diagnosis not present

## 2019-02-13 DIAGNOSIS — H43393 Other vitreous opacities, bilateral: Secondary | ICD-10-CM | POA: Diagnosis not present

## 2019-02-15 ENCOUNTER — Other Ambulatory Visit: Payer: Self-pay

## 2019-02-15 DIAGNOSIS — Z1211 Encounter for screening for malignant neoplasm of colon: Secondary | ICD-10-CM

## 2019-02-15 DIAGNOSIS — Z1212 Encounter for screening for malignant neoplasm of rectum: Secondary | ICD-10-CM

## 2019-02-15 LAB — POC HEMOCCULT BLD/STL (HOME/3-CARD/SCREEN)
Card #2 Fecal Occult Blod, POC: NEGATIVE
Card #3 Fecal Occult Blood, POC: NEGATIVE
Fecal Occult Blood, POC: NEGATIVE

## 2019-02-20 DIAGNOSIS — Z1212 Encounter for screening for malignant neoplasm of rectum: Secondary | ICD-10-CM

## 2019-02-20 DIAGNOSIS — Z1211 Encounter for screening for malignant neoplasm of colon: Secondary | ICD-10-CM | POA: Diagnosis not present

## 2019-03-06 NOTE — Progress Notes (Signed)
Office Visit Note  Patient: Alexandria Davies             Date of Birth: [birth year]           MRN: [id number]             PCP: Unk Pinto, MD Referring: Unk Pinto, MD Visit Date: 03/08/2019 Occupation: @GUAROCC @  Subjective:  Discuss prednisone taper   History of Present Illness: Alexandria Davies is a 86 y.o. female with history of PMR and osteoarthritis. She is currently taking prednisone 10 mg po daily, which she has been taking for 2 months.  She denies any signs or symptoms of a PMR flare.  She is not having any difficulty getting up from a seated position or raising her arms above her head. She denies any increased muscle weakness but she does tend to lose her balance easily.  She has ongoing ischial bursitis of the left hip. She states her right knee replacement is doing well. She has occasional discomfort in the left knee joint, and she goes for cortisone injections on a regular basis.     Activities of Daily Living:  Patient reports morning stiffness for 0 minutes.   Patient Denies nocturnal pain.  Difficulty dressing/grooming: Denies Difficulty climbing stairs: Denies Difficulty getting out of chair: Denies Difficulty using hands for taps, buttons, cutlery, and/or writing: Reports  Review of Systems  Constitutional: Positive for fatigue.  HENT: Negative for mouth sores, mouth dryness and nose dryness.   Eyes: Positive for dryness. Negative for pain and visual disturbance.  Respiratory: Negative for cough, hemoptysis, shortness of breath and difficulty breathing.   Cardiovascular: Negative for chest pain, palpitations, hypertension and swelling in legs/feet.  Gastrointestinal: Negative for blood in stool, constipation and diarrhea.  Endocrine: Negative for increased urination.  Genitourinary: Negative for painful urination.  Musculoskeletal: Positive for arthralgias and joint pain. Negative for joint swelling, myalgias, muscle weakness, morning stiffness, muscle  tenderness and myalgias.  Skin: Negative for color change, pallor, rash, hair loss, nodules/bumps, skin tightness, ulcers and sensitivity to sunlight.  Allergic/Immunologic: Negative for susceptible to infections.  Neurological: Negative for numbness, headaches, memory loss and weakness.  Hematological: Negative for swollen glands.  Psychiatric/Behavioral: Negative for depressed mood and sleep disturbance. The patient is not nervous/anxious.     PMFS History:  Patient Active Problem List   Diagnosis Date Noted  . Arthralgia 10/26/2018  . Insomnia 10/22/2014  . Generalized anxiety disorder 10/22/2014  . Labile hypertension 02/14/2014  . Mixed hyperlipidemia 02/14/2014  . Vitamin D deficiency 02/14/2014  . Rhinitis, allergic 08/19/2011  . Diverticulosis 04/23/2011  . Colon polyps 04/23/2011  . PERSONAL HISTORY OF MALIGNANT NEOPLASM OF BREAST 12/07/2007  . Hypothyroidism 12/05/2007  . Osteoarthritis 12/05/2007  . Internal hemorrhoids 11/08/2002    Past Medical History:  Diagnosis Date  . Anxiety   . Breast cancer ([place])    left  . Colon polyp 2009   BENIGN POLYPOID  . Diverticulosis of colon (without mention of hemorrhage) 2009  . DJD (degenerative joint disease)   . Family history of malignant neoplasm of gastrointestinal tract 05/06/2011  . HOH (hard of hearing)    Right ear  . Hypercholesteremia   . Hypothyroidism   . IBS (irritable bowel syndrome)   . Internal hemorrhoids without mention of complication   . Primary osteoarthritis of right knee 11/26/2015   S/p TKA  . Sinus drainage   . Vitiligo   . Wears dentures    top  . Wears  glasses     Family History  Problem Relation Age of Onset  . Alzheimer's disease Sister   . Cancer Sister 55       breast  . Breast cancer Sister   . Cancer Sister 13       breast cancer  . Colon cancer Son   . Heart disease Mother        died at age 53  . Alzheimer's disease Mother   . Testicular cancer Father    Past Surgical  History:  Procedure Laterality Date  . ABDOMINAL HYSTERECTOMY    . bladder tac    . BREAST LUMPECTOMY  2004   left lump-snbx  . BUNIONECTOMY     bilateral  . COLONOSCOPY  2009   several  . CYSTOCELE REPAIR  2009  . EYE SURGERY Bilateral    Cataract removal  . KNEE ARTHROSCOPY Left 01/17/2013   Procedure: ARTHROSCOPY LEFT KNEE;  Surgeon: Hessie Dibble, MD;  Location: [facility];  Service: Orthopedics;  Laterality: Left;  partial medial and partial lateral chondroplasty and removal loose body  . right shoulder  2003   bone spur removed-rcr  . TONSILLECTOMY    . TOTAL KNEE ARTHROPLASTY Right 11/26/2015   Procedure: TOTAL KNEE ARTHROPLASTY;  Surgeon: Melrose Nakayama, MD;  Location: [facility];  Service: Orthopedics;  Laterality: Right;  . WRIST FRACTURE SURGERY  2003   left-lipoma   Social History   Social History Narrative  . Not on file   Immunization History  Administered Date(s) Administered  . Influenza Split 12/01/2010  . Influenza, High Dose Seasonal PF 11/12/2015, 11/23/2016, 12/01/2017, 12/06/2018  . Influenza-Unspecified 10/31/2012, 11/29/2014  . Pneumococcal Conjugate-13 10/18/2013  . Pneumococcal Polysaccharide-23 03/20/2010  . Td 10/17/2012     Objective: Vital Signs: BP (!) 144/80 (BP Location: Left Arm, Patient Position: Sitting, Cuff Size: Normal)   Pulse 64   Resp 12   Ht 5\' 5"  (1.651 m)   Wt 147 lb 3.2 oz (66.8 kg)   BMI 24.50 kg/m    Physical Exam Vitals and nursing note reviewed.  Constitutional:      Appearance: She is well-developed.  HENT:     Head: Normocephalic and atraumatic.  Eyes:     Conjunctiva/sclera: Conjunctivae normal.  Cardiovascular:     Rate and Rhythm: Normal rate and regular rhythm.     Heart sounds: Normal heart sounds.  Pulmonary:     Effort: Pulmonary effort is normal.     Breath sounds: Normal breath sounds.  Abdominal:     General: Bowel sounds are normal.     Palpations: Abdomen is soft.    Musculoskeletal:     Cervical back: Normal range of motion.  Lymphadenopathy:     Cervical: No cervical adenopathy.  Skin:    General: Skin is warm and dry.     Capillary Refill: Capillary refill takes less than 2 seconds.  Neurological:     Mental Status: She is alert and oriented to person, place, and time.  Psychiatric:        Behavior: Behavior normal.      Musculoskeletal Exam: C-spine, thoracic spine, and lumbar spine good ROM.  Shoulder joints, elbow joints, wrist joints, MCPs, PIPs, and DIPs good ROM with no synovitis.  PIP and DIP synovial thickening consistent with osteoarthritis of both hands.  Hip joints good ROM with no discomfort.  Left ischial bursitis. Right knee replacement good ROM with no discomfort.  Left knee good ROM with no discomfort.  No warmth or effusion of knee joints. Dorsal spurs noted bilaterally.    CDAI Exam: CDAI Score: -- Patient Global: --; Provider Global: -- Swollen: --; Tender: -- Joint Exam 03/08/2019   No joint exam has been documented for this visit   There is currently no information documented on the homunculus. Go to the Rheumatology activity and complete the homunculus joint exam.  Investigation: No additional findings.  Imaging: No results found.  Recent Labs: Lab Results  Component Value Date   WBC 11.3 (H) 01/12/2019   HGB 13.2 01/12/2019   PLT 288 01/12/2019   NA 140 01/12/2019   K 4.2 01/12/2019   CL 106 01/12/2019   CO2 23 01/12/2019   GLUCOSE 123 (H) 01/12/2019   BUN 18 01/12/2019   CREATININE 0.88 01/12/2019   BILITOT 1.1 01/12/2019   ALKPHOS 62 04/23/2016   AST 15 01/12/2019   ALT 15 01/12/2019   PROT 6.2 01/12/2019   ALBUMIN 4.2 04/23/2016   CALCIUM 10.1 01/12/2019   GFRAA 67 01/12/2019    Speciality Comments: No specialty comments available.  Procedures:  No procedures performed Allergies: Ciprofloxacin, Epinephrine, Etodolac, Synephrine, and Flagyl [metronidazole hcl]   Assessment / Plan:      Visit Diagnoses: Polymyalgia rheumatica ([place]): She has not had any signs or symptoms of a PMR flare.  She has been taking prednisone 10 mg daily for the past 2 months.  She has not developed any increased muscle weakness or muscle aches recently.  She has no difficulty rising from a seated position or raising her arms above her head.  She will start to taper prednisone by 1 mg every month until we see her back in 4 months.  She was advised to notify us if she develops signs or symptoms of a flare.  She will follow up in 4 months.   Elevated C-reactive protein (CRP): CRP was 65.4 on 11/15/18.   Primary osteoarthritis of left knee: She has chronic pain in the left knee joint.  No warmth or effusion noted on exam.  She has good ROM with no discomfort. She has cortisone injections on a regular basis.   Status post total right knee replacement: Doing well.  She has no discomfort at this time.   Ischial bursitis of left side: She has ongoing tenderness and discomfort.    Positive ANA (antinuclear antibody) - ANA 1: 80 NH.  She has no clinical features of autoimmune disease.   Osteopenia of multiple sites -Her most recent DEXA was in 2018. She has a future DEXA scheduled on 04/05/19.   Other medical conditions are listed as follows:   Dyslipidemia  History of hypothyroidism  Vitamin D deficiency  History of anxiety  Orders: No orders of the defined types were placed in this encounter.  Meds ordered this encounter  Medications  . predniSONE (DELTASONE) 5 MG tablet    Sig: Take 1 tablet (5 mg total) by mouth daily with breakfast.    Dispense:  30 tablet    Refill:  0  . predniSONE (DELTASONE) 1 MG tablet    Sig: Take 4 tablets (4 mg total) by mouth daily with breakfast. Along with a 5mg  tablet for a total of 9mg  po daily.    Dispense:  120 tablet    Refill:  0    Face-to-face time spent with patient was 30 minutes. Greater than 50% of time was spent in counseling and coordination of  care.  Follow-Up Instructions: Return in about 4 months (around 07/06/2019)  for Polymyalgia Rheumatica.   Bo Merino, MD   Scribed by-  Hazel Sams, PA-C  Note - This record has been created using Dragon software.  Chart creation errors have been sought, but may not always  have been located. Such creation errors do not reflect on  the standard of medical care.

## 2019-03-07 ENCOUNTER — Ambulatory Visit: Payer: Medicare Other | Admitting: Rheumatology

## 2019-03-08 ENCOUNTER — Ambulatory Visit (INDEPENDENT_AMBULATORY_CARE_PROVIDER_SITE_OTHER): Payer: Medicare HMO | Admitting: Rheumatology

## 2019-03-08 ENCOUNTER — Other Ambulatory Visit: Payer: Self-pay

## 2019-03-08 ENCOUNTER — Encounter: Payer: Self-pay | Admitting: Physician Assistant

## 2019-03-08 ENCOUNTER — Encounter: Payer: Self-pay | Admitting: Rheumatology

## 2019-03-08 VITALS — BP 144/80 | HR 64 | Resp 12 | Ht 65.0 in | Wt 147.2 lb

## 2019-03-08 DIAGNOSIS — E559 Vitamin D deficiency, unspecified: Secondary | ICD-10-CM

## 2019-03-08 DIAGNOSIS — Z8639 Personal history of other endocrine, nutritional and metabolic disease: Secondary | ICD-10-CM | POA: Diagnosis not present

## 2019-03-08 DIAGNOSIS — R768 Other specified abnormal immunological findings in serum: Secondary | ICD-10-CM

## 2019-03-08 DIAGNOSIS — M353 Polymyalgia rheumatica: Secondary | ICD-10-CM

## 2019-03-08 DIAGNOSIS — M8589 Other specified disorders of bone density and structure, multiple sites: Secondary | ICD-10-CM

## 2019-03-08 DIAGNOSIS — E785 Hyperlipidemia, unspecified: Secondary | ICD-10-CM | POA: Diagnosis not present

## 2019-03-08 DIAGNOSIS — R7982 Elevated C-reactive protein (CRP): Secondary | ICD-10-CM

## 2019-03-08 DIAGNOSIS — Z96651 Presence of right artificial knee joint: Secondary | ICD-10-CM

## 2019-03-08 DIAGNOSIS — M1712 Unilateral primary osteoarthritis, left knee: Secondary | ICD-10-CM | POA: Diagnosis not present

## 2019-03-08 DIAGNOSIS — Z8659 Personal history of other mental and behavioral disorders: Secondary | ICD-10-CM

## 2019-03-08 DIAGNOSIS — M7072 Other bursitis of hip, left hip: Secondary | ICD-10-CM | POA: Diagnosis not present

## 2019-03-08 MED ORDER — PREDNISONE 1 MG PO TABS: 4.0000 mg | ORAL_TABLET | Freq: Every day | ORAL | 0 refills | Status: DC

## 2019-03-08 MED ORDER — PREDNISONE 5 MG PO TABS: 5.0000 mg | ORAL_TABLET | Freq: Every day | ORAL | 0 refills | Status: DC

## 2019-03-08 NOTE — Patient Instructions (Signed)
Reduce prednisone by 1 mg every month until your follow up visit in 4 months

## 2019-03-20 NOTE — Progress Notes (Deleted)
FOLLOW UP  Assessment and Plan:   Labile hypertension - continue medications, DASH diet, exercise and monitor at home. Call if greater than 130/80.  -     CBC with Differential/Platelet -     COMPLETE METABOLIC PANEL WITH GFR  Hypothyroidism, unspecified type Hypothyroidism-check TSH level, continue medications the same, reminded to take on an empty stomach 30-53mins before food.  -     TSH  Mixed hyperlipidemia check lipids decrease fatty foods increase activity.  -     Lipid panel  Prediabetes New; likely r/t steroid Discussed disease and risks Discussed diet/exercise, weight management  A1C   PMR (HCC) Symptoms well controlled on prednisone Following with Dr. Estanislado Pandy Has initiated taper and doing well with *** New prediabetes likely r/t steroid therapy will recheck A1C  Anxiety Well managed by current regimen; continue medications; uses benzo sparingly  Stress management techniques discussed, increase water, good sleep hygiene discussed, increase exercise, and increase veggies.   Vitamin D deficiency At goal at recent check; continue to recommend supplementation for goal of 60-100 Defer vitamin D level     Continue diet and meds as discussed. Further disposition pending results of labs. Discussed med's effects and SE's.   Over 30 minutes of exam, counseling, chart review, and critical decision making was performed.   Future Appointments  Date Time Provider [place]  03/22/2019 11:00 AM Liane Comber, NP [facility] None  04/05/2019  4:00 PM [facility] DX DEXA 1 [facility]  06/27/2019 11:30 AM Unk Pinto, MD [facility] None  07/06/2019 11:15 AM Bo Merino, MD [facility] None  09/25/2019 11:15 AM Liane Comber, NP [facility] None  01/16/2020  9:00 AM Unk Pinto, MD [facility] None   ----------------------------------------------------------------------------------------------------------------------  HPI 86 y.o. female  presents  for 3 month follow up on labile hypertension, cholesterol,  GAD/insomina and vitamin D deficiency.   She was diagnosed with PMR this year in 10/2018; has been on prednisone taper, now following with Dr. Estanislado Pandy. Seen recently 03/08/2019, was doing well with 10 mg daily, was iniated on plan to taper by 1 mg monthly and follow up in April 2021 if no flares prior.   BMI is There is no height or weight on file to calculate BMI., she has been working on diet and exercise. Wt Readings from Last 3 Encounters:  03/08/19 147 lb 3.2 oz (66.8 kg)  01/12/19 143 lb (64.9 kg)  01/04/19 145 lb (65.8 kg)   she has a diagnosis of anxiety and insomnia and is currently on longstanding regimen of PRN valium 5 mg, reports symptoms are well controlled on current regimen; she reports very occasional use - 1-2 times per week.    Today their BP is    She does not workout. She denies chest pain, shortness of breath, dizziness.   She is on cholesterol medication simvastatin 20 mg and has not had myalgias associated with this. Her cholesterol is not at goal. The cholesterol last visit was:   Lab Results  Component Value Date   CHOL 208 (H) 12/19/2018   HDL 77 12/19/2018   LDLCALC 111 (H) 12/19/2018   TRIG 98 12/19/2018   CHOLHDL 2.7 12/19/2018   She has *** been working on diet/exercise for prediabetes. Last A1C in the office was:  Lab Results  Component Value Date   HGBA1C 5.8 (H) 12/19/2018   She is on thyroid medication, she is on 19mcg one day a week and 1/2 a pill the other days. Her medication was not changed last  visit.   Lab Results  Component Value Date   TSH 1.06 12/19/2018   Patient is on Vitamin D supplement and at goal at the last visit:    Lab Results  Component Value Date   VD25OH 102 (H) 12/19/2018        Current Medications:  Current Outpatient Medications on File Prior to Visit  Medication Sig  . aspirin (ASPIRIN 81) 81 MG EC tablet Take 1 tablet daily  . Cholecalciferol (VITAMIN  D PO) Take 5,000 Units by mouth 2 (two) times daily.  . clotrimazole-betamethasone (LOTRISONE) cream Apply 1 application topically 2 (two) times daily. (Patient taking differently: Apply 1 application topically as needed. )  . diazepam (VALIUM) 5 MG tablet Take 1/2-1 tablet 2 - 3 x /day ONLY if needed for Anxiety Attack &  limit to 5 days /week to avoid addiction  . Ibuprofen (ADVIL PO) Take by mouth as needed.   Marland Kitchen levothyroxine (SYNTHROID) 75 MCG tablet Take 1 tablet daily on an empty stomach with only water for 30 minutes & no Antacid meds, Calcium or Magnesium for 4 hours & avoid Biotin  . predniSONE (DELTASONE) 1 MG tablet Take 4 tablets (4 mg total) by mouth daily with breakfast. Along with a 5mg  tablet for a total of 9mg  po daily.  . predniSONE (DELTASONE) 5 MG tablet Take 1 tablet (5 mg total) by mouth daily with breakfast.  . Probiotic Product (PROBIOTIC PO) Take 1 tablet by mouth daily.  . simvastatin (ZOCOR) 20 MG tablet Take 20 mg by mouth daily.   No current facility-administered medications on file prior to visit.     Allergies:  Allergies  Allergen Reactions  . Ciprofloxacin   . Epinephrine Other (See Comments)    Heart raced   . Etodolac Hives  . Synephrine     Palpitations   . Flagyl [Metronidazole Hcl] Rash     Medical History:  Past Medical History:  Diagnosis Date  . Anxiety   . Breast cancer ([place])    left  . Colon polyp 2009   BENIGN POLYPOID  . Diverticulosis of colon (without mention of hemorrhage) 2009  . DJD (degenerative joint disease)   . Family history of malignant neoplasm of gastrointestinal tract 05/06/2011  . HOH (hard of hearing)    Right ear  . Hypercholesteremia   . Hypothyroidism   . IBS (irritable bowel syndrome)   . Internal hemorrhoids without mention of complication   . Primary osteoarthritis of right knee 11/26/2015   S/p TKA  . Sinus drainage   . Vitiligo   . Wears dentures    top  . Wears glasses    Family history- Reviewed  and unchanged Social history- Reviewed and unchanged   Review of Systems:  Review of Systems  Constitutional: Negative for chills, diaphoresis, fever, malaise/fatigue and weight loss.  HENT: Negative for congestion, ear discharge, ear pain, hearing loss, nosebleeds, sinus pain, sore throat and tinnitus.   Eyes: Negative for blurred vision, double vision, photophobia, pain, discharge and redness.  Respiratory: Negative for cough, hemoptysis, sputum production, shortness of breath, wheezing and stridor.   Cardiovascular: Negative for chest pain, palpitations, orthopnea, claudication, leg swelling and PND.  Gastrointestinal: Negative for abdominal pain, blood in stool, constipation, diarrhea, heartburn, melena, nausea and vomiting.  Genitourinary: Negative.   Musculoskeletal: Negative for joint pain and myalgias.  Skin: Negative for itching and rash.  Neurological: Negative for dizziness, tingling, sensory change, weakness and headaches.  Endo/Heme/Allergies: Negative for environmental allergies and  polydipsia. Does not bruise/bleed easily.  Psychiatric/Behavioral: The patient is nervous/anxious (Intermittent, mild) and has insomnia (Intermittent, associated with anxiety - currently well controlled).   All other systems reviewed and are negative.   Physical Exam: There were no vitals taken for this visit. Wt Readings from Last 3 Encounters:  03/08/19 147 lb 3.2 oz (66.8 kg)  01/12/19 143 lb (64.9 kg)  01/04/19 145 lb (65.8 kg)   General appearance: alert, no distress, WD/WN, female HEENT: normocephalic, sclerae anicteric, TMs pearly, nares patent, no discharge or erythema, pharynx normal Oral cavity: MMM, no lesions Neck: supple, no lymphadenopathy, no thyromegaly, no masses Heart: RRR, normal S1, S2, no murmurs Lungs: CTA bilaterally, no wheezes, rhonchi, or rales Abdomen: +bs, soft, non tender, non distended, no masses, no hepatomegaly, no splenomegaly Musculoskeletal: nontender, no  swelling, no obvious deformity Extremities: no edema, no cyanosis, no clubbing Pulses: 2+ symmetric, upper and lower extremities, normal cap refill Neurological: alert, oriented x 3, CN2-12 intact, strength normal upper extremities and lower extremities, sensation normal throughout, DTRs 2+ throughout, no cerebellar signs, gait normal Psychiatric: normal affect, behavior normal, pleasant    Izora Ribas, NP 8:56 AM [facility]

## 2019-03-22 ENCOUNTER — Ambulatory Visit: Payer: Medicare Other | Admitting: Adult Health

## 2019-03-30 NOTE — Progress Notes (Signed)
FOLLOW UP  Assessment and Plan:  Labile hypertension - continue medications, DASH diet, exercise and monitor at home. Call if greater than 130/80.  -     CBC with Differential/Platelet -     COMPLETE METABOLIC PANEL WITH GFR -     TSH  Hypothyroidism, unspecified type -     levothyroxine (SYNTHROID) 150 MCG tablet; Take 1 tablet daily on an empty stomach with only water for 30 minutes & no Antacid meds, Calcium or Magnesium for 4 hours & avoid Biotin -     TSH - will try to send in 168mcg for her to break in half to save money.   PMR (polymyalgia rheumatica) (HCC) She is standing better, pain has improved, I instructed the patient that with prednisone you need to slowly taper and to contact Dr. Estanislado Pandy to continue to taper.   Mixed hyperlipidemia -     Lipid panel check lipids decrease fatty foods increase activity.   Abnormal glucose -     Hemoglobin A1c Discussed disease progression and risks Discussed diet/exercise, weight management and risk modification Will monitor with prednisone use, patient counseled.   Vitamin D deficiency -     VITAMIN D 25 Hydroxy (Vit-D Deficiency, Fractures)  Medication management -     Magnesium     Continue diet and meds as discussed. Further disposition pending results of labs. Discussed med's effects and SE's.   Over 30 minutes of exam, counseling, chart review, and critical decision making was performed.   Future Appointments  Date Time Provider [place]  04/05/2019  4:00 PM [facility] DX DEXA 1 [facility]  06/27/2019 11:30 AM Unk Pinto, MD [facility] None  07/06/2019 11:15 AM Bo Merino, MD [facility] None  09/25/2019 11:15 AM Liane Comber, NP [facility] None  01/16/2020  9:00 AM Unk Pinto, MD [facility] None   ----------------------------------------------------------------------------------------------------------------------  HPI 86 y.o. female  presents for 3 month follow up on labile  hypertension, cholesterol,  GAD/insomina and vitamin D deficiency.   She has had recent diagnosis of PMR, on prednisone and following with Dr. Estanislado Pandy. She states she does not like being on the prednisone, makes her feel angry and feel off balance. She states she is able to stand from a chair unassisted, she is feeling better.   BMI is Body mass index is 24.1 kg/m., she has been working on diet and exercise. Wt Readings from Last 3 Encounters:  03/31/19 144 lb 12.8 oz (65.7 kg)  03/08/19 147 lb 3.2 oz (66.8 kg)  01/12/19 143 lb (64.9 kg)   she has a diagnosis of anxiety and insomnia and is currently on longstanding regimen of PRN valium 5 mg, reports symptoms are well controlled on current regimen; she reports very occasional use - 1-2 times per week.    Today their BP is BP: 120/74  She does not workout. She denies chest pain, shortness of breath, dizziness.   She is on cholesterol medication and denies myalgias. Her cholesterol is at goal. The cholesterol last visit was:   Lab Results  Component Value Date   CHOL 208 (H) 12/19/2018   HDL 77 12/19/2018   LDLCALC 111 (H) 12/19/2018   TRIG 98 12/19/2018   CHOLHDL 2.7 12/19/2018    Last A1C in the office was:  Lab Results  Component Value Date   HGBA1C 5.8 (H) 12/19/2018   She is on thyroid medication, she is on 79mcg daily but wants to get the 183mcg to cut in half and save money.Her  medication was not changed last visit.   Lab Results  Component Value Date   TSH 1.06 12/19/2018   Patient is on Vitamin D supplement, she Is on 10,000 IU daily.  Lab Results  Component Value Date   VD25OH 102 (H) 12/19/2018        Current Medications:   Current Outpatient Medications (Endocrine & Metabolic):  .  levothyroxine (SYNTHROID) 75 MCG tablet, Take 1 tablet daily on an empty stomach with only water for 30 minutes & no Antacid meds, Calcium or Magnesium for 4 hours & avoid Biotin .  predniSONE (DELTASONE) 1 MG tablet, Take 4  tablets (4 mg total) by mouth daily with breakfast. Along with a 5mg  tablet for a total of 9mg  po daily. .  predniSONE (DELTASONE) 5 MG tablet, Take 1 tablet (5 mg total) by mouth daily with breakfast.  Current Outpatient Medications (Cardiovascular):  .  simvastatin (ZOCOR) 20 MG tablet, Take 20 mg by mouth daily.   Current Outpatient Medications (Analgesics):  .  aspirin (ASPIRIN 81) 81 MG EC tablet, Take 1 tablet daily .  Ibuprofen (ADVIL PO), Take by mouth as needed.    Current Outpatient Medications (Other):  Marland Kitchen  Cholecalciferol (VITAMIN D PO), Take 5,000 Units by mouth 2 (two) times daily. .  clotrimazole-betamethasone (LOTRISONE) cream, Apply 1 application topically 2 (two) times daily. (Patient taking differently: Apply 1 application topically as needed. ) .  diazepam (VALIUM) 5 MG tablet, Take 1/2-1 tablet 2 - 3 x /day ONLY if needed for Anxiety Attack &  limit to 5 days /week to avoid addiction .  Probiotic Product (PROBIOTIC PO), Take 1 tablet by mouth daily.   Allergies:  Allergies  Allergen Reactions  . Ciprofloxacin   . Epinephrine Other (See Comments)    Heart raced   . Etodolac Hives  . Synephrine     Palpitations   . Flagyl [Metronidazole Hcl] Rash     Medical History:  Past Medical History:  Diagnosis Date  . Anxiety   . Breast cancer ([place])    left  . Colon polyp 2009   BENIGN POLYPOID  . Diverticulosis of colon (without mention of hemorrhage) 2009  . DJD (degenerative joint disease)   . Family history of malignant neoplasm of gastrointestinal tract 05/06/2011  . HOH (hard of hearing)    Right ear  . Hypercholesteremia   . Hypothyroidism   . IBS (irritable bowel syndrome)   . Internal hemorrhoids without mention of complication   . Primary osteoarthritis of right knee 11/26/2015   S/p TKA  . Sinus drainage   . Vitiligo   . Wears dentures    top  . Wears glasses    Family history- Reviewed and unchanged Social history- Reviewed and  unchanged  Review of Systems  Constitutional: Negative.   HENT: Negative.   Eyes: Negative.   Respiratory: Negative.   Cardiovascular: Negative.   Gastrointestinal: Negative.   Genitourinary: Negative.   Musculoskeletal: Negative.   Skin: Negative.      Physical Exam: BP 120/74   Pulse 72   Temp 97.6 F (36.4 C)   Wt 144 lb 12.8 oz (65.7 kg)   SpO2 97%   BMI 24.10 kg/m  Wt Readings from Last 3 Encounters:  03/31/19 144 lb 12.8 oz (65.7 kg)  03/08/19 147 lb 3.2 oz (66.8 kg)  01/12/19 143 lb (64.9 kg)   General appearance: alert, no distress, WD/WN, female HEENT: normocephalic, sclerae anicteric, TMs pearly, nares patent, no discharge or erythema,  pharynx normal Oral cavity: MMM, no lesions Neck: supple, no lymphadenopathy, no thyromegaly, no masses Heart: RRR, normal S1, S2, no murmurs Lungs: CTA bilaterally, no wheezes, rhonchi, or rales Abdomen: +bs, soft, non tender, non distended, no masses, no hepatomegaly, no splenomegaly Musculoskeletal: nontender, no swelling, no obvious deformity Extremities: no edema, no cyanosis, no clubbing Pulses: 2+ symmetric, upper and lower extremities, normal cap refill Neurological: alert, oriented x 3, CN2-12 intact, strength normal upper extremities and lower extremities, sensation normal throughout, DTRs 2+ throughout, no cerebellar signs, gait normal Psychiatric: normal affect, behavior normal, pleasant .    Vicie Mutters, PA-C 11:23 AM [facility]

## 2019-03-31 ENCOUNTER — Ambulatory Visit (INDEPENDENT_AMBULATORY_CARE_PROVIDER_SITE_OTHER): Payer: Medicare HMO | Admitting: Physician Assistant

## 2019-03-31 ENCOUNTER — Encounter: Payer: Self-pay | Admitting: Physician Assistant

## 2019-03-31 ENCOUNTER — Other Ambulatory Visit: Payer: Self-pay

## 2019-03-31 VITALS — BP 120/74 | HR 72 | Temp 97.6°F | Wt 144.8 lb

## 2019-03-31 DIAGNOSIS — R7309 Other abnormal glucose: Secondary | ICD-10-CM | POA: Diagnosis not present

## 2019-03-31 DIAGNOSIS — Z79899 Other long term (current) drug therapy: Secondary | ICD-10-CM

## 2019-03-31 DIAGNOSIS — R0989 Other specified symptoms and signs involving the circulatory and respiratory systems: Secondary | ICD-10-CM | POA: Diagnosis not present

## 2019-03-31 DIAGNOSIS — M353 Polymyalgia rheumatica: Secondary | ICD-10-CM

## 2019-03-31 DIAGNOSIS — E559 Vitamin D deficiency, unspecified: Secondary | ICD-10-CM | POA: Diagnosis not present

## 2019-03-31 DIAGNOSIS — E039 Hypothyroidism, unspecified: Secondary | ICD-10-CM | POA: Diagnosis not present

## 2019-03-31 DIAGNOSIS — E782 Mixed hyperlipidemia: Secondary | ICD-10-CM | POA: Diagnosis not present

## 2019-03-31 MED ORDER — LEVOTHYROXINE SODIUM 150 MCG PO TABS: ORAL_TABLET | ORAL | 0 refills | Status: DC

## 2019-03-31 NOTE — Patient Instructions (Signed)
Let us know where you want the thyroid medication and we will send that in for you.    Polymyalgia Rheumatica Polymyalgia rheumatica (PMR) is an inflammatory disorder that causes the muscles and joints to ache and become stiff. Sometimes, PMR leads to a more dangerous condition that can cause vision loss (temporal arteritis or giant cell arteritis). What are the causes? The exact cause of PMR is not known. What increases the risk? You are more likely to develop this condition if you are:  Female.  84 years of age or older.  Caucasian. What are the signs or symptoms? Pain and stiffness are the main symptoms of PMR. Symptoms may:  Be worse after inactivity and in the morning.  Affect your: ? Hips, buttocks, and thighs. ? Neck, arms, and shoulders. This can make it hard to raise your arms above your head. ? Hands and wrists. Other symptoms include:  Fever.  Tiredness.  Weakness.  Depression.  Decreased appetite. This may lead to weight loss. Symptoms may start slowly or suddenly. How is this diagnosed? This condition is diagnosed with your medical history and a physical exam. You may need to see a health care provider who specializes in diseases of the joints, muscles, and bones (rheumatologist). You may also have tests, including:  Blood tests.  X-rays.  Ultrasound. How is this treated? PMR usually goes away without treatment, but it may take years. Your health care provider may recommend low-dose steroids and other medicines to help manage your symptoms of pain and stiffness. Regular exercise and rest will also help your symptoms. Follow these instructions at home:   Take over-the-counter and prescription medicines only as told by your health care provider.  Make sure to get enough rest and sleep.  Eat a healthy and nutritious diet.  Try to exercise most days of the week. Ask your health care provider what type of exercise is best for you.  Keep all follow-up  visits as told by your health care provider. This is important. Contact a health care provider if:  Your symptoms do not improve with medicine.  You have side effects from steroids. These may include: ? Weight gain. ? Swelling. ? Insomnia. ? Mood changes. ? Bruising. ? High blood sugar readings, if you have diabetes. ? Higher than normal blood pressure readings, if you monitor your blood pressure. Get help right away if:  You develop symptoms of temporal arteritis, such as: ? A change in vision. ? Severe headache. ? Scalp pain. ? Jaw pain. Summary  Polymyalgia rheumatica is an inflammatory disorder that causes aching and stiffness in your muscles and joints.  The exact cause of this condition is not known.  This condition usually goes away without treatment. Your health care provider may give you low-dose steroids to help manage your pain and stiffness.  Rest and regular exercise will help the symptoms. This information is not intended to replace advice given to you by your health care provider. Make sure you discuss any questions you have with your health care provider. Document Revised: 12/23/2017 Document Reviewed: 12/23/2017 Elsevier Patient Education  2020 [employer].

## 2019-04-01 LAB — LIPID PANEL
Cholesterol: 181 mg/dL (ref <200)
HDL: 78 mg/dL (ref >=50)
LDL Cholesterol (Calc): 80 mg/dL (calc)
Non-HDL Cholesterol (Calc): 103 mg/dL (calc) (ref <130)
Total CHOL/HDL Ratio: 2.3 (calc) (ref <5.0)
Triglycerides: 136 mg/dL (ref <150)

## 2019-04-01 LAB — HEMOGLOBIN A1C
Hgb A1c MFr Bld: 5.6 % of total Hgb (ref <5.7)
Mean Plasma Glucose: 114 (calc)
eAG (mmol/L): 6.3 (calc)

## 2019-04-01 LAB — COMPLETE METABOLIC PANEL WITH GFR
AG Ratio: 2.5 (calc) (ref 1.0–2.5)
ALT: 11 U/L (ref 6–29)
AST: 14 U/L (ref 10–35)
Albumin: 4.2 g/dL (ref 3.6–5.1)
Alkaline phosphatase (APISO): 51 U/L (ref 37–153)
BUN/Creatinine Ratio: 24 (calc) — ABNORMAL HIGH (ref 6–22)
BUN: 22 mg/dL (ref 7–25)
CO2: 27 mmol/L (ref 20–32)
Calcium: 9.9 mg/dL (ref 8.6–10.4)
Chloride: 107 mmol/L (ref 98–110)
Creat: 0.9 mg/dL — ABNORMAL HIGH (ref 0.60–0.88)
GFR, Est African American: 65 mL/min/{1.73_m2} (ref >=60)
GFR, Est Non African American: 56 mL/min/{1.73_m2} — ABNORMAL LOW (ref >=60)
Globulin: 1.7 g/dL (calc) — ABNORMAL LOW (ref 1.9–3.7)
Glucose, Bld: 103 mg/dL — ABNORMAL HIGH (ref 65–99)
Potassium: 4.2 mmol/L (ref 3.5–5.3)
Sodium: 142 mmol/L (ref 135–146)
Total Bilirubin: 1.2 mg/dL (ref 0.2–1.2)
Total Protein: 5.9 g/dL — ABNORMAL LOW (ref 6.1–8.1)

## 2019-04-01 LAB — CBC WITH DIFFERENTIAL/PLATELET
Absolute Monocytes: 550 cells/uL (ref 200–950)
Basophils Absolute: 44 cells/uL (ref 0–200)
Basophils Relative: 0.4 %
Eosinophils Absolute: 55 cells/uL (ref 15–500)
Eosinophils Relative: 0.5 %
HCT: 41.4 % (ref 35.0–45.0)
Hemoglobin: 13.9 g/dL (ref 11.7–15.5)
Lymphs Abs: 1166 cells/uL (ref 850–3900)
MCH: 29.5 pg (ref 27.0–33.0)
MCHC: 33.6 g/dL (ref 32.0–36.0)
MCV: 87.9 fL (ref 80.0–100.0)
MPV: 10.2 fL (ref 7.5–12.5)
Monocytes Relative: 5 %
Neutro Abs: 9185 cells/uL — ABNORMAL HIGH (ref 1500–7800)
Neutrophils Relative %: 83.5 %
Platelets: 251 10*3/uL (ref 140–400)
RBC: 4.71 10*6/uL (ref 3.80–5.10)
RDW: 12.7 % (ref 11.0–15.0)
Total Lymphocyte: 10.6 %
WBC: 11 10*3/uL — ABNORMAL HIGH (ref 3.8–10.8)

## 2019-04-01 LAB — MAGNESIUM: Magnesium: 2 mg/dL (ref 1.5–2.5)

## 2019-04-01 LAB — VITAMIN D 25 HYDROXY (VIT D DEFICIENCY, FRACTURES): Vit D, 25-Hydroxy: 90 ng/mL (ref 30–100)

## 2019-04-01 LAB — TSH: TSH: 1.13 mIU/L (ref 0.40–4.50)

## 2019-04-03 ENCOUNTER — Other Ambulatory Visit: Payer: Self-pay | Admitting: Rheumatology

## 2019-04-03 MED ORDER — PREDNISONE 5 MG PO TABS: 5.0000 mg | ORAL_TABLET | Freq: Every day | ORAL | 0 refills | Status: DC

## 2019-04-03 MED ORDER — PREDNISONE 1 MG PO TABS: 3.0000 mg | ORAL_TABLET | Freq: Every day | ORAL | 0 refills | Status: DC

## 2019-04-03 NOTE — Telephone Encounter (Signed)
Patient states she is in need of a refill on Prednisone. Patient states she is currently on 9 mg. According to office note on 03/08/19 she will taper by 1 mg every month.   Last Visit: 03/08/19 Next Visit: 07/06/19  Okay to refill per Dr. Estanislado Pandy

## 2019-04-03 NOTE — Telephone Encounter (Signed)
Patient left a voicemail stating she is out of medication and only has 4 more days remaining.  Patient states she needs to hear from someone soon so she can continue with her treatment.  Patient is requesting a return call today.

## 2019-04-05 ENCOUNTER — Ambulatory Visit
Admission: RE | Admit: 2019-04-05 | Discharge: 2019-04-05 | Disposition: A | Discharge status: Home / Self Care | Payer: Medicare HMO | Source: Ambulatory Visit | Attending: Adult Health Nurse Practitioner | Admitting: Adult Health Nurse Practitioner

## 2019-04-05 ENCOUNTER — Other Ambulatory Visit: Payer: Self-pay

## 2019-04-05 DIAGNOSIS — M8589 Other specified disorders of bone density and structure, multiple sites: Secondary | ICD-10-CM

## 2019-04-05 DIAGNOSIS — Z780 Asymptomatic menopausal state: Secondary | ICD-10-CM | POA: Diagnosis not present

## 2019-04-05 DIAGNOSIS — M85831 Other specified disorders of bone density and structure, right forearm: Secondary | ICD-10-CM | POA: Diagnosis not present

## 2019-04-11 ENCOUNTER — Telehealth: Payer: Self-pay

## 2019-04-11 NOTE — Telephone Encounter (Signed)
Patient inquiring about Bone Density test results. Please advise.

## 2019-04-13 ENCOUNTER — Other Ambulatory Visit: Payer: Self-pay

## 2019-04-13 DIAGNOSIS — E039 Hypothyroidism, unspecified: Secondary | ICD-10-CM

## 2019-04-13 MED ORDER — SIMVASTATIN 20 MG PO TABS: 20.0000 mg | ORAL_TABLET | Freq: Every day | ORAL | 1 refills | Status: DC

## 2019-04-13 MED ORDER — LEVOTHYROXINE SODIUM 150 MCG PO TABS: ORAL_TABLET | ORAL | 1 refills | Status: DC

## 2019-04-14 ENCOUNTER — Telehealth: Payer: Self-pay

## 2019-04-14 ENCOUNTER — Other Ambulatory Visit: Payer: Self-pay | Admitting: *Deleted

## 2019-04-14 DIAGNOSIS — E039 Hypothyroidism, unspecified: Secondary | ICD-10-CM

## 2019-04-14 MED ORDER — LEVOTHYROXINE SODIUM 150 MCG PO TABS: ORAL_TABLET | ORAL | 1 refills | Status: DC

## 2019-04-14 NOTE — Telephone Encounter (Signed)
On 04/13/2019 Alexandria Davies requested here DEXA scan results. A message was sent to the ordering provider at that time. Made patient aware that a message was sent as well to provider.

## 2019-04-17 ENCOUNTER — Telehealth: Payer: Self-pay | Admitting: Adult Health Nurse Practitioner

## 2019-04-17 NOTE — Telephone Encounter (Signed)
Patient calling to get DEXA  results. Please advise.

## 2019-04-18 ENCOUNTER — Telehealth: Payer: Self-pay | Admitting: Adult Health Nurse Practitioner

## 2019-04-18 NOTE — Telephone Encounter (Signed)
returned your call.

## 2019-05-03 ENCOUNTER — Telehealth: Payer: Self-pay | Admitting: Rheumatology

## 2019-05-03 MED ORDER — PREDNISONE 5 MG PO TABS: 5.0000 mg | ORAL_TABLET | Freq: Every day | ORAL | 0 refills | Status: DC

## 2019-05-03 MED ORDER — PREDNISONE 1 MG PO TABS: 2.0000 mg | ORAL_TABLET | Freq: Every day | ORAL | 0 refills | Status: DC

## 2019-05-03 NOTE — Telephone Encounter (Signed)
Patient advised according to Dr. Arlean Hopping last note she is to taper by 1 mg each month until she comes to the office in May. Patient will be starting on 7 mg in a few days.

## 2019-05-03 NOTE — Telephone Encounter (Signed)
Patient called stating she has been taking Prednisone 8 mg daily (1 5 mg tablet and 3 1 mg tablets).  Patient states she will be out of medication on Sunday, 05/07/19.  Patient is requesting a return call to let her know if Dr. Estanislado Pandy wants her to continue the current dosage or if she will be decreasing it.  Patient requested a return call today 05/03/19 before the end of the day.

## 2019-05-13 ENCOUNTER — Ambulatory Visit: Payer: Medicare HMO | Attending: Internal Medicine

## 2019-05-13 DIAGNOSIS — Z23 Encounter for immunization: Secondary | ICD-10-CM

## 2019-05-13 NOTE — Progress Notes (Signed)
   Covid-19 Vaccination Clinic  Name:  Alexandria Davies    MRN: [id number] DOB: 05/08/1925  05/13/2019  [name] was observed post Covid-19 immunization for 15 minutes without incident. She was provided with Vaccine Information Sheet and instruction to access the V-Safe system.   [name] was instructed to call 911 with any severe reactions post vaccine: Marland Kitchen Difficulty breathing  . Swelling of face and throat  . A fast heartbeat  . A bad rash all over body  . Dizziness and weakness   Immunizations Administered    Name Date Dose VIS Date Route   Pfizer COVID-19 Vaccine 05/13/2019  9:47 AM 0.3 mL 02/10/2019 Intramuscular   Manufacturer: [place]   Lot: [id number]   [place]: [id number]

## 2019-06-01 ENCOUNTER — Telehealth: Payer: Self-pay | Admitting: Rheumatology

## 2019-06-01 MED ORDER — PREDNISONE 5 MG PO TABS: 5.0000 mg | ORAL_TABLET | Freq: Every day | ORAL | 0 refills | Status: DC

## 2019-06-01 MED ORDER — PREDNISONE 1 MG PO TABS: 1.0000 mg | ORAL_TABLET | Freq: Every day | ORAL | 0 refills | Status: DC

## 2019-06-01 NOTE — Telephone Encounter (Signed)
Last Visit: 03/08/19 Next Visit: 07/06/19  Patient states she is on Prednisone 7 mg and due to taper to Prednisone 6 mg on 06/07/19.   Okay to refill per Dr. Estanislado Pandy

## 2019-06-01 NOTE — Telephone Encounter (Signed)
Patient request refill on Prednisone sent to [facility].

## 2019-06-06 ENCOUNTER — Ambulatory Visit: Payer: Medicare HMO | Attending: Internal Medicine

## 2019-06-06 DIAGNOSIS — Z23 Encounter for immunization: Secondary | ICD-10-CM

## 2019-06-06 NOTE — Progress Notes (Signed)
   Covid-19 Vaccination Clinic  Name:  Alexandria Davies    MRN: [id number] DOB: 1925-07-24  06/06/2019  [name] was observed post Covid-19 immunization for 15 minutes without incident. She was provided with Vaccine Information Sheet and instruction to access the V-Safe system.   [name] was instructed to call 911 with any severe reactions post vaccine: Marland Kitchen Difficulty breathing  . Swelling of face and throat  . A fast heartbeat  . A bad rash all over body  . Dizziness and weakness   Immunizations Administered    Name Date Dose VIS Date Route   Pfizer COVID-19 Vaccine 06/06/2019 10:17 AM 0.3 mL 02/10/2019 Intramuscular   Manufacturer: [employer]   Lot: [id number]   [place]: [id number]

## 2019-06-27 ENCOUNTER — Ambulatory Visit: Payer: Medicare Other | Admitting: Internal Medicine

## 2019-06-27 DIAGNOSIS — H00022 Hordeolum internum right lower eyelid: Secondary | ICD-10-CM | POA: Diagnosis not present

## 2019-06-27 DIAGNOSIS — H0102B Squamous blepharitis left eye, upper and lower eyelids: Secondary | ICD-10-CM | POA: Diagnosis not present

## 2019-06-27 DIAGNOSIS — H0102A Squamous blepharitis right eye, upper and lower eyelids: Secondary | ICD-10-CM | POA: Diagnosis not present

## 2019-06-30 ENCOUNTER — Other Ambulatory Visit: Payer: Self-pay | Admitting: Internal Medicine

## 2019-06-30 DIAGNOSIS — Z1231 Encounter for screening mammogram for malignant neoplasm of breast: Secondary | ICD-10-CM

## 2019-06-30 NOTE — Progress Notes (Signed)
Office Visit Note  Patient: Alexandria Davies             Date of Birth: 08-13-1925           MRN: [id number]             PCP: Unk Pinto, MD Referring: Unk Pinto, MD Visit Date: 07/06/2019 Occupation: @GUAROCC @  Subjective:  Right knee pain   History of Present Illness: Alexandria Davies is a 86 y.o. female with history of PMR and osteoarthritis.  She has been taking prednisone 6 mg daily for the past 1 month.  She has been tapering prednisone by 1 mg every month.   She denies any signs or symptoms of a flare since reducing the dose of prednisone.  She denies any muscle weakness or muscle tenderness at this time.  She has no difficulty rising from a seated position or raising her arms above her head.  She states on Sunday while at church she hit her right knee replacement on a chair, which has been causing increased discomfort.  She has been using aspercreme topically as needed.   Activities of Daily Living:  Patient reports morning stiffness for 0 minutes.   Patient Denies nocturnal pain.  Difficulty dressing/grooming: Denies Difficulty climbing stairs: Denies Difficulty getting out of chair: Denies Difficulty using hands for taps, buttons, cutlery, and/or writing: Reports  Review of Systems  Constitutional: Positive for fatigue.  HENT: Negative for mouth sores, mouth dryness and nose dryness.   Eyes: Positive for dryness. Negative for pain and visual disturbance.  Respiratory: Negative for cough, hemoptysis, shortness of breath and difficulty breathing.   Cardiovascular: Negative for chest pain, palpitations, hypertension and swelling in legs/feet.  Gastrointestinal: Positive for diarrhea. Negative for blood in stool and constipation.  Endocrine: Negative for increased urination.  Genitourinary: Negative for difficulty urinating and painful urination.  Musculoskeletal: Positive for arthralgias and joint pain. Negative for joint swelling, myalgias, muscle weakness,  morning stiffness, muscle tenderness and myalgias.  Skin: Negative for color change, pallor, rash, hair loss, nodules/bumps, redness, skin tightness, ulcers and sensitivity to sunlight.  Allergic/Immunologic: Negative for susceptible to infections.  Neurological: Negative for dizziness, numbness, headaches and memory loss.  Hematological: Positive for bruising/bleeding tendency. Negative for swollen glands.  Psychiatric/Behavioral: Negative for depressed mood, confusion and sleep disturbance. The patient is not nervous/anxious.     PMFS History:  Patient Active Problem List   Diagnosis Date Noted  . PMR (polymyalgia rheumatica) ([place]) 10/26/2018  . Insomnia 10/22/2014  . Generalized anxiety disorder 10/22/2014  . Labile hypertension 02/14/2014  . Mixed hyperlipidemia 02/14/2014  . Vitamin D deficiency 02/14/2014  . Rhinitis, allergic 08/19/2011  . Diverticulosis 04/23/2011  . Colon polyps 04/23/2011  . PERSONAL HISTORY OF MALIGNANT NEOPLASM OF BREAST 12/07/2007  . Hypothyroidism 12/05/2007  . Osteoarthritis 12/05/2007  . Internal hemorrhoids 11/08/2002    Past Medical History:  Diagnosis Date  . Anxiety   . Breast cancer ([place])    left  . Colon polyp 2009   BENIGN POLYPOID  . Diverticulosis of colon (without mention of hemorrhage) 2009  . DJD (degenerative joint disease)   . Family history of malignant neoplasm of gastrointestinal tract 05/06/2011  . HOH (hard of hearing)    Right ear  . Hypercholesteremia   . Hypothyroidism   . IBS (irritable bowel syndrome)   . Internal hemorrhoids without mention of complication   . Primary osteoarthritis of right knee 11/26/2015   S/p TKA  . Sinus drainage   .  Vitiligo   . Wears dentures    top  . Wears glasses     Family History  Problem Relation Age of Onset  . Alzheimer's disease Sister   . Cancer Sister 63       breast  . Breast cancer Sister   . Cancer Sister 16       breast cancer  . Colon cancer Son   . Heart disease  Mother        died at age 3  . Alzheimer's disease Mother   . Testicular cancer Father    Past Surgical History:  Procedure Laterality Date  . ABDOMINAL HYSTERECTOMY    . bladder tac    . BREAST LUMPECTOMY  2004   left lump-snbx  . BUNIONECTOMY     bilateral  . COLONOSCOPY  2009   several  . CYSTOCELE REPAIR  2009  . EYE SURGERY Bilateral    Cataract removal  . KNEE ARTHROSCOPY Left 01/17/2013   Procedure: ARTHROSCOPY LEFT KNEE;  Surgeon: Hessie Dibble, MD;  Location: [facility];  Service: Orthopedics;  Laterality: Left;  partial medial and partial lateral chondroplasty and removal loose body  . right shoulder  2003   bone spur removed-rcr  . TONSILLECTOMY    . TOTAL KNEE ARTHROPLASTY Right 11/26/2015   Procedure: TOTAL KNEE ARTHROPLASTY;  Surgeon: Melrose Nakayama, MD;  Location: [facility];  Service: Orthopedics;  Laterality: Right;  . WRIST FRACTURE SURGERY  2003   left-lipoma   Social History   Social History Narrative  . Not on file   Immunization History  Administered Date(s) Administered  . Influenza Split 12/01/2010  . Influenza, High Dose Seasonal PF 11/12/2015, 11/23/2016, 12/01/2017, 12/06/2018  . Influenza-Unspecified 10/31/2012, 11/29/2014  . PFIZER SARS-COV-2 Vaccination 05/13/2019, 06/06/2019  . Pneumococcal Conjugate-13 10/18/2013  . Pneumococcal Polysaccharide-23 03/20/2010  . Td 10/17/2012     Objective: Vital Signs: BP 137/76 (BP Location: Left Arm, Patient Position: Sitting, Cuff Size: Normal)   Pulse 60   Resp 14   Ht 5' 5.75" (1.67 m)   Wt 144 lb 6.4 oz (65.5 kg)   BMI 23.48 kg/m    Physical Exam Vitals and nursing note reviewed.  Constitutional:      Appearance: She is well-developed.  HENT:     Head: Normocephalic and atraumatic.  Eyes:     Conjunctiva/sclera: Conjunctivae normal.  Pulmonary:     Effort: Pulmonary effort is normal.  Abdominal:     General: Bowel sounds are normal.     Palpations: Abdomen is soft.    Musculoskeletal:     Cervical back: Normal range of motion.  Lymphadenopathy:     Cervical: No cervical adenopathy.  Skin:    General: Skin is warm and dry.     Capillary Refill: Capillary refill takes less than 2 seconds.  Neurological:     Mental Status: She is alert and oriented to person, place, and time.  Psychiatric:        Behavior: Behavior normal.      Musculoskeletal Exam: C-spine, thoracic spine, and lumbar spine good ROM.  Shoulder joints, elbow joints, wrist joints, MCPs, PIPs, and DIPs good ROM with no synovitis.  PIP and DIP thickening consistent with osteoarthritis of both hands.  Complete fist formation bilaterally.  Hip joints good ROM with no discomfort.  Right knee replacement has painful ROM with warmth but no effusion.  Left knee replacement has good ROM with no warmth or effusion.  Ankle joints good ROM  with no tenderness or inflammation.    CDAI Exam: CDAI Score: -- Patient Global: --; Provider Global: -- Swollen: --; Tender: -- Joint Exam 07/06/2019   No joint exam has been documented for this visit   There is currently no information documented on the homunculus. Go to the Rheumatology activity and complete the homunculus joint exam.  Investigation: No additional findings.  Imaging: No results found.  Recent Labs: Lab Results  Component Value Date   WBC 11.0 (H) 03/31/2019   HGB 13.9 03/31/2019   PLT 251 03/31/2019   NA 142 03/31/2019   K 4.2 03/31/2019   CL 107 03/31/2019   CO2 27 03/31/2019   GLUCOSE 103 (H) 03/31/2019   BUN 22 03/31/2019   CREATININE 0.90 (H) 03/31/2019   BILITOT 1.2 03/31/2019   ALKPHOS 62 04/23/2016   AST 14 03/31/2019   ALT 11 03/31/2019   PROT 5.9 (L) 03/31/2019   ALBUMIN 4.2 04/23/2016   CALCIUM 9.9 03/31/2019   GFRAA 65 03/31/2019    Speciality Comments: No specialty comments available.  Procedures:  No procedures performed Allergies: Ciprofloxacin, Epinephrine, Etodolac, Synephrine, and Flagyl  [metronidazole hcl]   Assessment / Plan:     Visit Diagnoses: Polymyalgia rheumatica ([place]): She has not had any signs or symptoms of a PMR flare recently.  She has been taking prednisone 6 mg po daily for 1 month.  Her last dose of 6 mg is tomorrow.  She has been tapering prednisone by 1 mg every month.  She has not developed any signs or symptoms of a flare while reducing the dose of prednisone.  She has no muscle weakness, myalgias, or muscle tenderness at this time.  She has no difficulty rising from a seated position or raising her arms above her head.  She will continue reducing prednisone by 1 mg every month.  A prescription for prednisone 5 mg 1 tablet daily was sent to the pharmacy.  She was advised to notify us if she develops symptoms of a flare.  She will follow up in 3 months.   Elevated C-reactive protein (CRP): CRP was 65.4 on 11/15/18.    Status post total right knee replacement: She reports that while at church on Sunday she hit her right knee on a chair, and she has had discomfort since then.  She has not noticed any bruising but has painful ROM.  No ecchymosis or erythema noted.  Warmth of the right knee replacement noted.  She was advised to schedule an appointment with her orthopedic surgeon for further evaluation.   Status post total left knee replacement: Doing well.  She has good ROM with no discomfort.  No warmth or effusion noted.   Ischial bursitis of left side - Resolved.  Positive ANA (antinuclear antibody) - ANA 1: 80 NH.  She has no clinical features of autoimmune disease.   Osteopenia of multiple sites - DEXA 04/05/19: The BMD measured at Forearm Radius 33% is 0.759 g/cm2 w/ T-score of -1.4.  She is taking Vitamin D 5,000 units twice daily. No falls or fractures.   Other medical conditions are listed as follows:   Dyslipidemia  History of hypothyroidism  Vitamin D deficiency  History of anxiety  Orders: No orders of the defined types were placed in this  encounter.  Meds ordered this encounter  Medications  . predniSONE (DELTASONE) 5 MG tablet    Sig: Take 1 tablet (5 mg total) by mouth daily with breakfast.    Dispense:  30 tablet  Refill:  0      Follow-Up Instructions: Return in about 3 months (around 10/06/2019) for Polymyalgia Rheumatica.   Hazel Sams, PA-C  I examined and evaluated the patient with Hazel Sams PA.  Patient had good muscle strength in her upper and lower extremities on examination.  She has been doing well on tapering her prednisone.  She is on 6 mg of prednisone currently and will be decreasing it to 5 mg.  She had recent injury to her right knee joint which is causing discomfort.  She has been advised to see an orthopedic surgeon.  She has multiple questions today which were all answered at length.  The plan of care was discussed as noted above.  Bo Merino, MD  Note - This record has been created using Editor, commissioning.  Chart creation errors have been sought, but may not always  have been located. Such creation errors do not reflect on  the standard of medical care.

## 2019-07-06 ENCOUNTER — Other Ambulatory Visit: Payer: Self-pay

## 2019-07-06 ENCOUNTER — Encounter: Payer: Self-pay | Admitting: Rheumatology

## 2019-07-06 ENCOUNTER — Ambulatory Visit (INDEPENDENT_AMBULATORY_CARE_PROVIDER_SITE_OTHER): Payer: Medicare HMO | Admitting: Rheumatology

## 2019-07-06 VITALS — BP 137/76 | HR 60 | Resp 14 | Ht 65.75 in | Wt 144.4 lb

## 2019-07-06 DIAGNOSIS — Z96652 Presence of left artificial knee joint: Secondary | ICD-10-CM | POA: Diagnosis not present

## 2019-07-06 DIAGNOSIS — R768 Other specified abnormal immunological findings in serum: Secondary | ICD-10-CM

## 2019-07-06 DIAGNOSIS — M7072 Other bursitis of hip, left hip: Secondary | ICD-10-CM | POA: Diagnosis not present

## 2019-07-06 DIAGNOSIS — M353 Polymyalgia rheumatica: Secondary | ICD-10-CM

## 2019-07-06 DIAGNOSIS — Z8659 Personal history of other mental and behavioral disorders: Secondary | ICD-10-CM

## 2019-07-06 DIAGNOSIS — M8589 Other specified disorders of bone density and structure, multiple sites: Secondary | ICD-10-CM | POA: Diagnosis not present

## 2019-07-06 DIAGNOSIS — Z96651 Presence of right artificial knee joint: Secondary | ICD-10-CM | POA: Diagnosis not present

## 2019-07-06 DIAGNOSIS — E559 Vitamin D deficiency, unspecified: Secondary | ICD-10-CM | POA: Diagnosis not present

## 2019-07-06 DIAGNOSIS — R7982 Elevated C-reactive protein (CRP): Secondary | ICD-10-CM | POA: Diagnosis not present

## 2019-07-06 DIAGNOSIS — Z8639 Personal history of other endocrine, nutritional and metabolic disease: Secondary | ICD-10-CM

## 2019-07-06 DIAGNOSIS — E785 Hyperlipidemia, unspecified: Secondary | ICD-10-CM | POA: Diagnosis not present

## 2019-07-06 MED ORDER — PREDNISONE 5 MG PO TABS: 5.0000 mg | ORAL_TABLET | Freq: Every day | ORAL | 0 refills | Status: DC

## 2019-07-13 NOTE — Progress Notes (Signed)
FOLLOW UP  Assessment and Plan:   Labile hypertension - fairly controlled off of medications at this time, DASH diet, exercise and monitor at home. Call if persistentlygreater than 140/90.  -     CBC with Differential/Platelet -     COMPLETE METABOLIC PANEL WITH GFR -     TSH  Hypothyroidism, unspecified type Taking 1/2 tab 6 days a week, 1 tab once a week 150 mcg tabs to save money, didn't do well with generic -     levothyroxine (SYNTHROID) 150 MCG tablet; Take 1 tablet daily on an empty stomach with only water for 30 minutes & no Antacid meds, Calcium or Magnesium for 4 hours & avoid Biotin -     TSH   PMR (polymyalgia rheumatica) ([facility]) She is standing better, pain has improved, continue slow taper per Dr. Estanislado Pandy  Mixed hyperlipidemia Recently well controlled by lifestyle Continue low cholesterol diet and exercise.  Check lipid panel annually due to age    Abnormal glucose Abnormal glucose Recent A1Cs at goal Discussed diet/exercise, weight management  Defer A1C; check CMP  Vitamin D deficiency At goal at recent check; continue to recommend supplementation for goal of 60-100 Defer vitamin D level  Medication management -     CBC -     CMP/GFR -     Magnesium   Continue diet and meds as discussed. Further disposition pending results of labs. Discussed med's effects and SE's.   Over 30 minutes of exam, counseling, chart review, and critical decision making was performed.   Future Appointments  Date Time Provider Rough and Ready  08/10/2019 11:20 AM [facility] MM 2 [facility]  10/09/2019 11:00 AM Bo Merino, MD [facility] None  10/16/2019 11:15 AM Liane Comber, NP [facility] None  01/16/2020  9:00 AM Unk Pinto, MD [facility] None   ----------------------------------------------------------------------------------------------------------------------  HPI 86 y.o. female  presents for 3 month follow up on labile hypertension, cholesterol,   GAD/insomina and vitamin D deficiency.   She reports is down, granddaughter Nira Conn, passed away from OD 6 weeks ago, was close. Son died 3 years ago from colon cancer. However she reports is a positive person, is doing better, declines daily med. Has valium to take PRN sleep or anxiety, has only taken 1/2 tab in past week.   She has had recent diagnosis of PMR, on prednisone taper and following with Dr. Estanislado Pandy.  She states she does not like being on the prednisone, makes her feel angry and feel off balance. She states she is able to stand from a chair unassisted, she is feeling better. Recently reduced from prednisone 6 mg daily to 5 mg daily, plan to decrease 1 mg daily/month. Recently hit her knee in a pew a few weeks ago, has had TKA, reports persistent ache and burning sensation. Slowly improving, but plans to follow up with surgeon if needed.   BMI is Body mass index is 23.91 kg/m., Watches diet closely due to IBS, lots of greens. Reports very active in yard and housework but no intentional exercise. Knees limit her some.  Wt Readings from Last 3 Encounters:  07/14/19 147 lb (66.7 kg)  07/06/19 144 lb 6.4 oz (65.5 kg)  03/31/19 144 lb 12.8 oz (65.7 kg)   She does not check BPs at home, today their BP is BP: 136/82  She does not workout. She denies chest pain, shortness of breath, dizziness.   She is on cholesterol medication and denies myalgias. Her cholesterol is at goal. The cholesterol last  visit was:   Lab Results  Component Value Date   CHOL 181 03/31/2019   HDL 78 03/31/2019   LDLCALC 80 03/31/2019   TRIG 136 03/31/2019   CHOLHDL 2.3 03/31/2019    Last A1C in the office was:  Lab Results  Component Value Date   HGBA1C 5.6 03/31/2019   She is on thyroid medication, she is prescribed 150 mcg tabs, takes 1/2 tab 6 days a week, whole tab once per week.Her medication was not changed last visit.   Lab Results  Component Value Date   TSH 1.13 03/31/2019   Patient is on  Vitamin D supplement, she Is on 10,000 IU daily.  Lab Results  Component Value Date   VD25OH 90 03/31/2019       Current Medications:   Current Outpatient Medications (Endocrine & Metabolic):  .  levothyroxine (SYNTHROID) 150 MCG tablet, Take 1 tablet daily on an empty stomach with only water for 30 minutes & no Antacid meds, Calcium or Magnesium for 4 hours & avoid Biotin .  predniSONE (DELTASONE) 5 MG tablet, Take 1 tablet (5 mg total) by mouth daily with breakfast.  Current Outpatient Medications (Cardiovascular):  .  simvastatin (ZOCOR) 20 MG tablet, Take 1 tablet (20 mg total) by mouth daily.   Current Outpatient Medications (Analgesics):  Marland Kitchen  Acetaminophen (TYLENOL PO), Take by mouth as needed. Marland Kitchen  aspirin (ASPIRIN 81) 81 MG EC tablet, Take 1 tablet daily .  Ibuprofen (ADVIL PO), Take by mouth as needed.    Current Outpatient Medications (Other):  Marland Kitchen  Cholecalciferol (VITAMIN D PO), Take 5,000 Units by mouth 2 (two) times daily. .  clotrimazole-betamethasone (LOTRISONE) cream, Apply 1 application topically 2 (two) times daily. (Patient taking differently: Apply 1 application topically as needed. ) .  diazepam (VALIUM) 5 MG tablet, Take 1/2-1 tablet 2 - 3 x /day ONLY if needed for Anxiety Attack &  limit to 5 days /week to avoid addiction .  Probiotic Product (PROBIOTIC PO), Take 1 tablet by mouth daily.   Allergies:  Allergies  Allergen Reactions  . Ciprofloxacin   . Epinephrine Other (See Comments)    Heart raced   . Etodolac Hives  . Synephrine     Palpitations   . Flagyl [Metronidazole Hcl] Rash     Medical History:  Past Medical History:  Diagnosis Date  . Anxiety   . Breast cancer ([place])    left  . Colon polyp 2009   BENIGN POLYPOID  . Diverticulosis of colon (without mention of hemorrhage) 2009  . DJD (degenerative joint disease)   . Family history of malignant neoplasm of gastrointestinal tract 05/06/2011  . HOH (hard of hearing)    Right ear  .  Hypercholesteremia   . Hypothyroidism   . IBS (irritable bowel syndrome)   . Internal hemorrhoids without mention of complication   . Primary osteoarthritis of right knee 11/26/2015   S/p TKA  . Sinus drainage   . Vitiligo   . Wears dentures    top  . Wears glasses    Family history- Reviewed and unchanged Social history- Reviewed and unchanged  Review of Systems  Constitutional: Negative.  Negative for malaise/fatigue and weight loss.  HENT: Negative.  Negative for hearing loss and tinnitus.   Eyes: Negative.  Negative for blurred vision and double vision.  Respiratory: Negative.  Negative for cough, shortness of breath and wheezing.   Cardiovascular: Negative.  Negative for chest pain, palpitations, orthopnea, claudication and leg swelling.  Gastrointestinal:  Negative.  Negative for abdominal pain, blood in stool, constipation, diarrhea, heartburn, melena, nausea and vomiting.  Genitourinary: Negative.   Musculoskeletal: Positive for joint pain (knees, occasional hip). Negative for falls and myalgias.  Skin: Negative.  Negative for rash.  Neurological: Negative for dizziness, tingling, sensory change, weakness and headaches.  Endo/Heme/Allergies: Negative for polydipsia.  Psychiatric/Behavioral: Negative for depression, substance abuse and suicidal ideas. The patient is not nervous/anxious and does not have insomnia (rare).   All other systems reviewed and are negative.    Physical Exam: BP 136/82   Pulse 60   Temp (!) 97 F (36.1 C)   Resp 16   Wt 147 lb (66.7 kg)   SpO2 99%   BMI 23.91 kg/m  Wt Readings from Last 3 Encounters:  07/14/19 147 lb (66.7 kg)  07/06/19 144 lb 6.4 oz (65.5 kg)  03/31/19 144 lb 12.8 oz (65.7 kg)   General appearance: alert, no distress, WD/WN, female HEENT: normocephalic, sclerae anicteric, TMs pearly, nares patent, no discharge or erythema, pharynx normal Oral cavity: MMM, no lesions Neck: supple, no lymphadenopathy, no thyromegaly, no  masses Heart: RRR, normal S1, S2, no murmurs Lungs: CTA bilaterally, no wheezes, rhonchi, or rales Abdomen: +bs, soft, non tender, non distended, no masses, no hepatomegaly, no splenomegaly Musculoskeletal: nontender, no swelling, no obvious deformity Extremities: no edema, no cyanosis, no clubbing Pulses: 2+ symmetric, upper and lower extremities, normal cap refill Neurological: alert, oriented x 3, CN2-12 intact, strength normal upper extremities and lower extremities, sensation normal throughout, DTRs 2+ throughout, no cerebellar signs, gait normal Psychiatric: normal affect, behavior normal, pleasant .    Izora Ribas, NP 11:21 AM [facility]

## 2019-07-14 ENCOUNTER — Other Ambulatory Visit: Payer: Self-pay

## 2019-07-14 ENCOUNTER — Ambulatory Visit: Payer: Medicare HMO | Admitting: Internal Medicine

## 2019-07-14 ENCOUNTER — Encounter: Payer: Self-pay | Admitting: Adult Health

## 2019-07-14 ENCOUNTER — Ambulatory Visit (INDEPENDENT_AMBULATORY_CARE_PROVIDER_SITE_OTHER): Payer: Medicare HMO | Admitting: Adult Health

## 2019-07-14 VITALS — BP 136/82 | HR 60 | Temp 97.0°F | Resp 16 | Wt 147.0 lb

## 2019-07-14 DIAGNOSIS — Z6823 Body mass index (BMI) 23.0-23.9, adult: Secondary | ICD-10-CM | POA: Diagnosis not present

## 2019-07-14 DIAGNOSIS — G4700 Insomnia, unspecified: Secondary | ICD-10-CM | POA: Diagnosis not present

## 2019-07-14 DIAGNOSIS — F411 Generalized anxiety disorder: Secondary | ICD-10-CM

## 2019-07-14 DIAGNOSIS — E782 Mixed hyperlipidemia: Secondary | ICD-10-CM | POA: Diagnosis not present

## 2019-07-14 DIAGNOSIS — R69 Illness, unspecified: Secondary | ICD-10-CM | POA: Diagnosis not present

## 2019-07-14 DIAGNOSIS — E039 Hypothyroidism, unspecified: Secondary | ICD-10-CM | POA: Diagnosis not present

## 2019-07-14 DIAGNOSIS — E559 Vitamin D deficiency, unspecified: Secondary | ICD-10-CM | POA: Diagnosis not present

## 2019-07-14 DIAGNOSIS — R0989 Other specified symptoms and signs involving the circulatory and respiratory systems: Secondary | ICD-10-CM

## 2019-07-14 DIAGNOSIS — M353 Polymyalgia rheumatica: Secondary | ICD-10-CM

## 2019-07-14 MED ORDER — LEVOTHYROXINE SODIUM 150 MCG PO TABS: ORAL_TABLET | ORAL | 1 refills | Status: DC

## 2019-07-14 NOTE — Patient Instructions (Addendum)
Goals    . Blood Pressure < 140/90    . Exercise 150 min/wk Moderate Activity     Aim for 20-30 min daily       Recommend focusing on building lower body strength for bone health, balance and to prevent falls. If feeling more unsteady please call and can set up for physical therapy.    Fall Prevention in the Home, Adult Falls can cause injuries and can affect people from all age groups. There are many simple things that you can do to make your home safe and to help prevent falls. Ask for help when making these changes, if needed. What actions can I take to prevent falls? General instructions  Use good lighting in all rooms. Replace any light bulbs that burn out.  Turn on lights if it is dark. Use night-lights.  Place frequently used items in easy-to-reach places. Lower the shelves around your home if necessary.  Set up furniture so that there are clear paths around it. Avoid moving your furniture around.  Remove throw rugs and other tripping hazards from the floor.  Avoid walking on wet floors.  Fix any uneven floor surfaces.  Add color or contrast paint or tape to grab bars and handrails in your home. Place contrasting color strips on the first and last steps of stairways.  When you use a stepladder, make sure that it is completely opened and that the sides are firmly locked. Have someone hold the ladder while you are using it. Do not climb a closed stepladder.  Be aware of any and all pets. What can I do in the bathroom?      Keep the floor dry. Immediately clean up any water that spills onto the floor.  Remove soap buildup in the tub or shower on a regular basis.  Use non-skid mats or decals on the floor of the tub or shower.  Attach bath mats securely with double-sided, non-slip rug tape.  If you need to sit down while you are in the shower, use a plastic, non-slip stool.  Install grab bars by the toilet and in the tub and shower. Do not use towel bars as grab  bars. What can I do in the bedroom?  Make sure that a bedside light is easy to reach.  Do not use oversized bedding that drapes onto the floor.  Have a firm chair that has side arms to use for getting dressed. What can I do in the kitchen?  Clean up any spills right away.  If you need to reach for something above you, use a sturdy step stool that has a grab bar.  Keep electrical cables out of the way.  Do not use floor polish or wax that makes floors slippery. If you must use wax, make sure that it is non-skid floor wax. What can I do in the stairways?  Do not leave any items on the stairs.  Make sure that you have a light switch at the top of the stairs and the bottom of the stairs. Have them installed if you do not have them.  Make sure that there are handrails on both sides of the stairs. Fix handrails that are broken or loose. Make sure that handrails are as long as the stairways.  Install non-slip stair treads on all stairs in your home.  Avoid having throw rugs at the top or bottom of stairways, or secure the rugs with carpet tape to prevent them from moving.  Choose a carpet  design that does not hide the edge of steps on the stairway.  Check any carpeting to make sure that it is firmly attached to the stairs. Fix any carpet that is loose or worn. What can I do on the outside of my home?  Use bright outdoor lighting.  Regularly repair the edges of walkways and driveways and fix any cracks.  Remove high doorway thresholds.  Trim any shrubbery on the main path into your home.  Regularly check that handrails are securely fastened and in good repair. Both sides of any steps should have handrails.  Install guardrails along the edges of any raised decks or porches.  Clear walkways of debris and clutter, including tools and rocks.  Have leaves, snow, and ice cleared regularly.  Use sand or salt on walkways during winter months.  In the garage, clean up any spills  right away, including grease or oil spills. What other actions can I take?  Wear closed-toe shoes that fit well and support your feet. Wear shoes that have rubber soles or low heels.  Use mobility aids as needed, such as canes, walkers, scooters, and crutches.  Review your medicines with your health care provider. Some medicines can cause dizziness or changes in blood pressure, which increase your risk of falling. Talk with your health care provider about other ways that you can decrease your risk of falls. This may include working with a physical therapist or trainer to improve your strength, balance, and endurance. Where to find more information  Centers for Disease Control and Prevention, [employer]: WebmailGuide.co.za  [employer] on Aging: BrainJudge.co.uk Contact a health care provider if:  You are afraid of falling at home.  You feel weak, drowsy, or dizzy at home.  You fall at home. Summary  There are many simple things that you can do to make your home safe and to help prevent falls.  Ways to make your home safe include removing tripping hazards and installing grab bars in the bathroom.  Ask for help when making these changes in your home. This information is not intended to replace advice given to you by your health care provider. Make sure you discuss any questions you have with your health care provider. Document Revised: 01/29/2017 Document Reviewed: 10/01/2016 Elsevier Patient Education  2020 [employer].

## 2019-07-15 ENCOUNTER — Encounter: Payer: Self-pay | Admitting: Adult Health

## 2019-07-15 DIAGNOSIS — N183 Chronic kidney disease, stage 3 unspecified: Secondary | ICD-10-CM | POA: Insufficient documentation

## 2019-07-15 LAB — COMPLETE METABOLIC PANEL WITH GFR
AG Ratio: 2.4 (calc) (ref 1.0–2.5)
ALT: 12 U/L (ref 6–29)
AST: 16 U/L (ref 10–35)
Albumin: 4.3 g/dL (ref 3.6–5.1)
Alkaline phosphatase (APISO): 57 U/L (ref 37–153)
BUN: 19 mg/dL (ref 7–25)
CO2: 27 mmol/L (ref 20–32)
Calcium: 10.1 mg/dL (ref 8.6–10.4)
Chloride: 107 mmol/L (ref 98–110)
Creat: 0.88 mg/dL (ref 0.60–0.88)
GFR, Est African American: 67 mL/min/{1.73_m2} (ref >=60)
GFR, Est Non African American: 57 mL/min/{1.73_m2} — ABNORMAL LOW (ref >=60)
Globulin: 1.8 g/dL (calc) — ABNORMAL LOW (ref 1.9–3.7)
Glucose, Bld: 94 mg/dL (ref 65–99)
Potassium: 4.3 mmol/L (ref 3.5–5.3)
Sodium: 142 mmol/L (ref 135–146)
Total Bilirubin: 0.9 mg/dL (ref 0.2–1.2)
Total Protein: 6.1 g/dL (ref 6.1–8.1)

## 2019-07-15 LAB — CBC WITH DIFFERENTIAL/PLATELET
Absolute Monocytes: 531 cells/uL (ref 200–950)
Basophils Absolute: 42 cells/uL (ref 0–200)
Basophils Relative: 0.5 %
Eosinophils Absolute: 83 cells/uL (ref 15–500)
Eosinophils Relative: 1 %
HCT: 43.1 % (ref 35.0–45.0)
Hemoglobin: 14.3 g/dL (ref 11.7–15.5)
Lymphs Abs: 1154 cells/uL (ref 850–3900)
MCH: 29.9 pg (ref 27.0–33.0)
MCHC: 33.2 g/dL (ref 32.0–36.0)
MCV: 90 fL (ref 80.0–100.0)
MPV: 10.5 fL (ref 7.5–12.5)
Monocytes Relative: 6.4 %
Neutro Abs: 6491 cells/uL (ref 1500–7800)
Neutrophils Relative %: 78.2 %
Platelets: 254 10*3/uL (ref 140–400)
RBC: 4.79 10*6/uL (ref 3.80–5.10)
RDW: 12.3 % (ref 11.0–15.0)
Total Lymphocyte: 13.9 %
WBC: 8.3 10*3/uL (ref 3.8–10.8)

## 2019-07-15 LAB — LIPID PANEL
Cholesterol: 175 mg/dL (ref <200)
HDL: 79 mg/dL (ref >=50)
LDL Cholesterol (Calc): 73 mg/dL (calc)
Non-HDL Cholesterol (Calc): 96 mg/dL (calc) (ref <130)
Total CHOL/HDL Ratio: 2.2 (calc) (ref <5.0)
Triglycerides: 148 mg/dL (ref <150)

## 2019-07-15 LAB — TSH: TSH: 0.69 mIU/L (ref 0.40–4.50)

## 2019-07-15 LAB — MAGNESIUM: Magnesium: 2.2 mg/dL (ref 1.5–2.5)

## 2019-07-21 DIAGNOSIS — Z96651 Presence of right artificial knee joint: Secondary | ICD-10-CM | POA: Diagnosis not present

## 2019-07-21 DIAGNOSIS — M25561 Pain in right knee: Secondary | ICD-10-CM | POA: Diagnosis not present

## 2019-07-21 DIAGNOSIS — M1712 Unilateral primary osteoarthritis, left knee: Secondary | ICD-10-CM | POA: Diagnosis not present

## 2019-08-01 ENCOUNTER — Telehealth: Payer: Self-pay | Admitting: Rheumatology

## 2019-08-01 MED ORDER — PREDNISONE 1 MG PO TABS
4.0000 mg | ORAL_TABLET | Freq: Every day | ORAL | 0 refills | Status: DC
Start: 2019-08-01 — End: 2019-08-30

## 2019-08-01 NOTE — Telephone Encounter (Signed)
Patient requesting a refill on Prednisone, pt going to 4 pills at a time now, sent to a [place]. Please send to [facility]. Patient request a call once it's sent it. She needs to check on cost, etc.

## 2019-08-01 NOTE — Telephone Encounter (Signed)
Last Visit: 07/06/2019 Next visit: 10/09/2019  Current Dose per office note on 07/06/2019: prednisone 6 mg po daily for 1 month.  Her last dose of 6 mg is tomorrow.  She has been tapering prednisone by 1 mg every month.A prescription for prednisone 5 mg 1 tablet daily was sent to the pharmacy.  Okay to refill per Dr. Estanislado Pandy

## 2019-08-01 NOTE — Telephone Encounter (Signed)
Left message to advise patient prescription sent to the pharmacy.

## 2019-08-10 ENCOUNTER — Other Ambulatory Visit: Payer: Self-pay

## 2019-08-10 ENCOUNTER — Ambulatory Visit
Admission: RE | Admit: 2019-08-10 | Discharge: 2019-08-10 | Disposition: A | Discharge status: Home / Self Care | Payer: Medicare HMO | Source: Ambulatory Visit | Attending: Internal Medicine | Admitting: Internal Medicine

## 2019-08-10 DIAGNOSIS — Z1231 Encounter for screening mammogram for malignant neoplasm of breast: Secondary | ICD-10-CM

## 2019-08-30 ENCOUNTER — Other Ambulatory Visit: Payer: Self-pay | Admitting: Rheumatology

## 2019-08-30 MED ORDER — PREDNISONE 1 MG PO TABS: 3.0000 mg | ORAL_TABLET | Freq: Every day | ORAL | 0 refills | Status: DC

## 2019-08-30 NOTE — Telephone Encounter (Signed)
Patient called requesting prescription refill of Prednisone to be sent to [place].  Patient states she only has 1 week remaining of her medication and requesting the prescription be sent in today since it takes "a long time to receive it by mail."    Patient is also confirming that she is to decrease her dosage to 3 tablets (3 mg total) by mouth daily.

## 2019-08-30 NOTE — Telephone Encounter (Signed)
Last Visit: 07/06/2019 Next visit: 10/09/2019  Current Dose per patient she is tapering to Prednisone 3 mg and is doing well with tapering   Okay to refill Prednisone?

## 2019-09-25 ENCOUNTER — Ambulatory Visit: Payer: Medicare Other | Admitting: Adult Health

## 2019-09-25 NOTE — Progress Notes (Signed)
Office Visit Note  Patient: Alexandria Davies             Date of Birth: 1925/09/28           MRN: [id number]             PCP: Unk Pinto, MD Referring: Unk Pinto, MD Visit Date: 10/09/2019 Occupation: @GUAROCC @  Subjective:  Medication monitoring   History of Present Illness: Alexandria Davies is a 86 y.o. female with history of polymyalgia rheumatica.  She tapered prednisone from 3 mg to 2 mg starting today.  She denies any increased muscle weakness, muscle tenderness, or myalgias since she has been tapering prednisone.  She has no difficulty rising from a seated position or raising her arms above her head.  She states that she continues have intermittent pain in both knees.  Her right knee joint is replaced.  She continues to follow-up with Dr. Rhona Raider.  The ischial bursitis of the left side has resolved.    Activities of Daily Living:  Patient reports morning stiffness for 0 minutes.   Patient Denies nocturnal pain.  Difficulty dressing/grooming: Denies Difficulty climbing stairs: Denies Difficulty getting out of chair: Denies Difficulty using hands for taps, buttons, cutlery, and/or writing: Denies  Review of Systems  Constitutional: Negative for fatigue.  HENT: Negative for mouth sores, mouth dryness and nose dryness.   Eyes: Positive for dryness. Negative for itching.  Respiratory: Negative for shortness of breath and difficulty breathing.   Cardiovascular: Negative for chest pain and palpitations.  Gastrointestinal: Negative for blood in stool, constipation and diarrhea.  Endocrine: Negative for increased urination.  Genitourinary: Negative for difficulty urinating.  Musculoskeletal: Positive for arthralgias and joint pain. Negative for joint swelling, myalgias, morning stiffness, muscle tenderness and myalgias.  Skin: Negative for color change, rash and redness.  Allergic/Immunologic: Negative for susceptible to infections.  Neurological: Positive for  weakness. Negative for dizziness, numbness, headaches and memory loss.  Hematological: Positive for bruising/bleeding tendency.  Psychiatric/Behavioral: Negative for confusion.    PMFS History:  Patient Active Problem List   Diagnosis Date Noted  . CKD (chronic kidney disease) stage 3, GFR 30-59 ml/min 07/15/2019  . PMR (polymyalgia rheumatica) ([place]) 10/26/2018  . Insomnia 10/22/2014  . Generalized anxiety disorder 10/22/2014  . Labile hypertension 02/14/2014  . Mixed hyperlipidemia 02/14/2014  . Vitamin D deficiency 02/14/2014  . Rhinitis, allergic 08/19/2011  . Diverticulosis 04/23/2011  . Colon polyps 04/23/2011  . PERSONAL HISTORY OF MALIGNANT NEOPLASM OF BREAST 12/07/2007  . Hypothyroidism 12/05/2007  . Osteoarthritis 12/05/2007  . Internal hemorrhoids 11/08/2002    Past Medical History:  Diagnosis Date  . Anxiety   . Breast cancer ([place])    left  . Colon polyp 2009   BENIGN POLYPOID  . Diverticulosis of colon (without mention of hemorrhage) 2009  . DJD (degenerative joint disease)   . Family history of malignant neoplasm of gastrointestinal tract 05/06/2011  . HOH (hard of hearing)    Right ear  . Hypercholesteremia   . Hypothyroidism   . IBS (irritable bowel syndrome)   . Internal hemorrhoids without mention of complication   . Primary osteoarthritis of right knee 11/26/2015   S/p TKA  . Sinus drainage   . Vitiligo   . Wears dentures    top  . Wears glasses     Family History  Problem Relation Age of Onset  . Alzheimer's disease Sister   . Cancer Sister 67       breast  .  Breast cancer Sister   . Cancer Sister 60       breast cancer  . Colon cancer Son   . Heart disease Mother        died at age 89  . Alzheimer's disease Mother   . Testicular cancer Father    Past Surgical History:  Procedure Laterality Date  . ABDOMINAL HYSTERECTOMY    . bladder tac    . BREAST LUMPECTOMY  2004   left lump-snbx  . BUNIONECTOMY     bilateral  . COLONOSCOPY  2009    several  . CYSTOCELE REPAIR  2009  . EYE SURGERY Bilateral    Cataract removal  . KNEE ARTHROSCOPY Left 01/17/2013   Procedure: ARTHROSCOPY LEFT KNEE;  Surgeon: Hessie Dibble, MD;  Location: [facility];  Service: Orthopedics;  Laterality: Left;  partial medial and partial lateral chondroplasty and removal loose body  . right shoulder  2003   bone spur removed-rcr  . TONSILLECTOMY    . TOTAL KNEE ARTHROPLASTY Right 11/26/2015   Procedure: TOTAL KNEE ARTHROPLASTY;  Surgeon: Melrose Nakayama, MD;  Location: [facility];  Service: Orthopedics;  Laterality: Right;  . WRIST FRACTURE SURGERY  2003   left-lipoma   Social History   Social History Narrative  . Not on file   Immunization History  Administered Date(s) Administered  . Influenza Split 12/01/2010  . Influenza, High Dose Seasonal PF 11/12/2015, 11/23/2016, 12/01/2017, 12/06/2018  . Influenza-Unspecified 10/31/2012, 11/29/2014  . PFIZER SARS-COV-2 Vaccination 05/13/2019, 06/06/2019  . Pneumococcal Conjugate-13 10/18/2013  . Pneumococcal Polysaccharide-23 03/20/2010  . Td 10/17/2012     Objective: Vital Signs: BP (!) 152/77 (BP Location: Left Arm, Patient Position: Sitting, Cuff Size: Normal)   Pulse (!) 55   Resp 15   Ht 5\' 5"  (1.651 m)   Wt 146 lb 12.8 oz (66.6 kg)   BMI 24.43 kg/m    Physical Exam Vitals and nursing note reviewed.  Constitutional:      Appearance: She is well-developed.  HENT:     Head: Normocephalic and atraumatic.  Eyes:     Conjunctiva/sclera: Conjunctivae normal.  Pulmonary:     Effort: Pulmonary effort is normal.  Abdominal:     General: Bowel sounds are normal.     Palpations: Abdomen is soft.  Musculoskeletal:     Cervical back: Normal range of motion.  Lymphadenopathy:     Cervical: No cervical adenopathy.  Skin:    General: Skin is warm and dry.     Capillary Refill: Capillary refill takes less than 2 seconds.  Neurological:     Mental Status: She is alert and  oriented to person, place, and time.  Psychiatric:        Behavior: Behavior normal.      Musculoskeletal Exam: C-spine, thoracic spine, and lumbar spine have good range of motion.  Shoulder joints, elbow joints, wrist joints, MCPs, PIPs, DIPs have good range of motion with no synovitis.  She has PIP and DIP thickening consistent with osteoarthritis of both hands.  She has complete fist formation bilaterally.  Hip joints have good range of motion with no discomfort.  Left knee has slightly limited extension.  Right knee replacement warmth and limited extension noted.  Ankle joints have good range of motion with no tenderness or inflammation.  CDAI Exam: CDAI Score: -- Patient Global: --; Provider Global: -- Swollen: --; Tender: -- Joint Exam 10/09/2019   No joint exam has been documented for this visit   There  is currently no information documented on the homunculus. Go to the Rheumatology activity and complete the homunculus joint exam.  Investigation: No additional findings.  Imaging: No results found.  Recent Labs: Lab Results  Component Value Date   WBC 8.3 07/14/2019   HGB 14.3 07/14/2019   PLT 254 07/14/2019   NA 142 07/14/2019   K 4.3 07/14/2019   CL 107 07/14/2019   CO2 27 07/14/2019   GLUCOSE 94 07/14/2019   BUN 19 07/14/2019   CREATININE 0.88 07/14/2019   BILITOT 0.9 07/14/2019   ALKPHOS 62 04/23/2016   AST 16 07/14/2019   ALT 12 07/14/2019   PROT 6.1 07/14/2019   ALBUMIN 4.2 04/23/2016   CALCIUM 10.1 07/14/2019   GFRAA 67 07/14/2019    Speciality Comments: No specialty comments available.  Procedures:  No procedures performed Allergies: Ciprofloxacin, Epinephrine, Etodolac, Synephrine, and Flagyl [metronidazole hcl]   Assessment / Plan:     Visit Diagnoses: Polymyalgia rheumatica ([place]) - She has not had any signs or symptoms of a polymyalgia rheumatica flare.  She has not had any recurrence of symptoms since starting to taper prednisone by 1 mg every  month.  She tapered prednisone from 3 mg daily to 2 mg daily starting today.  She is not experiencing any muscle weakness, muscle tenderness, or myalgias at this time.  She has no difficulty rising from a seated position or raising her arms above her head. She will take prednisone 2 mg daily for 1 month and then 1 mg daily for 1 month and then discontinue use.  She was advised to notify us if she develops any signs or symptoms of a flare.  She will follow up in 3 months.   Elevated C-reactive protein (CRP)  Status post total right knee replacement: She experiences intermittent discomfort in the right knee which is replaced.  She is wearing a compression sleeve for support and comfort.  She is followed by Dr. Rhona Raider.  Ischial bursitis of left side - Resolved.  Positive ANA (antinuclear antibody) - ANA 1: 80 NH.  She has no clinical features of autoimmune disease.   Osteopenia of multiple sites - DEXA 04/05/19: The BMD measured at Forearm Radius 33% is 0.759 g/cm2 w/ T-score of -1.4. Repeat DEXA in February 2023.  Other medical conditions are listed as follows:   History of anxiety  History of hypothyroidism  Dyslipidemia  Vitamin D deficiency  Orders: No orders of the defined types were placed in this encounter.  No orders of the defined types were placed in this encounter.     Follow-Up Instructions: Return in about 3 months (around 01/09/2020) for Polymyalgia Rheumatica.   Ofilia Neas, PA-C   I examined and evaluated the patient with Hazel Sams PA.  Patient has been doing well on tapering dose of prednisone.  She had no muscular weakness or tenderness on examination.  She has some underlying osteoarthritis.  She will continue prednisone taper as discussed above.  The plan of care was discussed as noted above.  Bo Merino, MD Note - This record has been created using Editor, commissioning.  Chart creation errors have been sought, but may not always  have been located. Such  creation errors do not reflect on  the standard of medical care.

## 2019-10-02 ENCOUNTER — Other Ambulatory Visit: Payer: Self-pay | Admitting: Rheumatology

## 2019-10-02 NOTE — Telephone Encounter (Signed)
Patient called requesting prescription refill of Prednisone (1 mg tablets) to be sent to [place].  Patient states she will be out of medication on Saturday and it takes 4-5 days for the pharmacy to send it to her.

## 2019-10-02 NOTE — Telephone Encounter (Addendum)
Last Visit: 07/06/2019 Next visit: 10/09/2019  Patient states she is due to taper to Prednisone 2 mg on 10/07/2019.  Okay to refill Prednisone?

## 2019-10-03 MED ORDER — PREDNISONE 1 MG PO TABS: 2.0000 mg | ORAL_TABLET | Freq: Every day | ORAL | 0 refills | Status: DC

## 2019-10-09 ENCOUNTER — Other Ambulatory Visit: Payer: Self-pay

## 2019-10-09 ENCOUNTER — Encounter: Payer: Self-pay | Admitting: Rheumatology

## 2019-10-09 ENCOUNTER — Ambulatory Visit: Payer: Medicare HMO | Admitting: Rheumatology

## 2019-10-09 VITALS — BP 152/77 | HR 55 | Resp 15 | Ht 65.0 in | Wt 146.8 lb

## 2019-10-09 DIAGNOSIS — R768 Other specified abnormal immunological findings in serum: Secondary | ICD-10-CM

## 2019-10-09 DIAGNOSIS — M8589 Other specified disorders of bone density and structure, multiple sites: Secondary | ICD-10-CM | POA: Diagnosis not present

## 2019-10-09 DIAGNOSIS — R7982 Elevated C-reactive protein (CRP): Secondary | ICD-10-CM

## 2019-10-09 DIAGNOSIS — Z8639 Personal history of other endocrine, nutritional and metabolic disease: Secondary | ICD-10-CM | POA: Diagnosis not present

## 2019-10-09 DIAGNOSIS — E785 Hyperlipidemia, unspecified: Secondary | ICD-10-CM | POA: Diagnosis not present

## 2019-10-09 DIAGNOSIS — M353 Polymyalgia rheumatica: Secondary | ICD-10-CM

## 2019-10-09 DIAGNOSIS — M7072 Other bursitis of hip, left hip: Secondary | ICD-10-CM | POA: Diagnosis not present

## 2019-10-09 DIAGNOSIS — R69 Illness, unspecified: Secondary | ICD-10-CM | POA: Diagnosis not present

## 2019-10-09 DIAGNOSIS — Z8659 Personal history of other mental and behavioral disorders: Secondary | ICD-10-CM

## 2019-10-09 DIAGNOSIS — Z96651 Presence of right artificial knee joint: Secondary | ICD-10-CM

## 2019-10-09 DIAGNOSIS — E559 Vitamin D deficiency, unspecified: Secondary | ICD-10-CM

## 2019-10-12 NOTE — Progress Notes (Addendum)
MEDICARE ANNUAL WELLNESS VISIT AND FOLLOW UP  Assessment:   Diagnoses and all orders for this visit:  Encounter for Medicare annual wellness exam  Labile hypertension At goal off of medications Monitor blood pressure at home; call if consistently over 130/80 Continue DASH diet.   Reminder to go to the ER if any CP, SOB, nausea, dizziness, severe HA, changes vision/speech, left arm numbness and tingling and jaw pain.  Internal hemorrhoids PRN medicaitons  Allergic rhinitis, unspecified seasonality, unspecified trigger Continue OTC allergy pills PRN  Diverticulosis Add soluble fiber, denies concerning sx  Polyp of colon, unspecified part of colon, unspecified type Hx of, No further colonoscopies due to age, hemoccult annually at CPE, no concerning sx  Hypothyroidism, unspecified type continue medications the same pending lab results reminded to take on an empty stomach 30-73mins before food.  check TSH level  Osteoarthritis of multiple joints, unspecified osteoarthritis type Followed by ortho, s/p TKA   Vitamin D deficiency At goal at recent check; continue to recommend supplementation for goal of 70-100 Defer vitamin D level  Personal history of malignant neoplasm of breast Continue annual mammograms  Mixed hyperlipidemia Low risk hx, in light of age discussed d/c, she is in agreement  Continue low cholesterol diet and exercise.  Defer checking as would not pursue aggressively secondary to age  Medication management CBC, CMP/GFR, magnesium  Insomnia, unspecified type Doing well on rare valium use PRN  Major depression in remission [facility]) Generalized anxiety disorder Fairly managed, in remission off of daily agent at this time. Rare use of benzo. Continue to monitor.  Stress management techniques discussed, increase water, good sleep hygiene discussed, increase exercise, and increase veggies.   BMI 23.0-23.9, adult Continue to recommend diet heavy in fruits  and veggies and low in animal meats, cheeses, and dairy products, appropriate calorie intake Discuss exercise recommendations routinely Continue to monitor weight at each visit    Over 40 minutes of exam, counseling, chart review and critical decision making was performed Future Appointments  Date Time Provider [place]  01/09/2020 10:40 AM Ofilia Neas, PA-C [facility] None  01/16/2020  9:00 AM Unk Pinto, MD [facility] None     Plan:   During the course of the visit the patient was educated and counseled about appropriate screening and preventive services including:    Pneumococcal vaccine   Prevnar 13  Influenza vaccine  Td vaccine  Screening electrocardiogram  Bone densitometry screening  Colorectal cancer screening  Diabetes screening  Glaucoma screening  Nutrition counseling   Advanced directives: requested   Subjective:  Alexandria Davies is a 86 y.o. female who presents for Medicare Annual Wellness Visit and 3 month follow up.   she has a diagnosis of anxiety and has been on valium 5 mg TID PRN. she currently takes very rarely, takes 2-3 a month or so when she cannot sleep.   She was diagnosed with PMR last year, follows with Dr. Duke Salvia, on prednisone, down to 2 mg daily and doing well.   BMI is Body mass index is 24.13 kg/m., she has been working on diet, exercise is somewhat limited due to joint pains (mainly knees) recently, but generally active around her house and yard. She plans to get joint injection with Dr. Norm Salt once done with prednisone taper which has helped in the past.  Wt Readings from Last 3 Encounters:  10/16/19 145 lb (65.8 kg)  10/09/19 146 lb 12.8 oz (66.6 kg)  07/14/19 147 lb (66.7 kg)   She  does not currently check BP at home, today their BP is BP: 134/62 She does not workout. She denies chest pain, shortness of breath, dizziness.   She is on cholesterol medication (simvastatin 20 mg daily) and denies  myalgias. Her cholesterol is at goal. The cholesterol last visit was:   Lab Results  Component Value Date   CHOL 175 07/14/2019   HDL 79 07/14/2019   LDLCALC 73 07/14/2019   TRIG 148 07/14/2019   CHOLHDL 2.2 07/14/2019   She has been working on diet and exercise for glucose management, and denies foot ulcerations, increased appetite, nausea, paresthesia of the feet, polydipsia, polyuria, visual disturbances, vomiting and weight loss. Last A1C in the office was:  Lab Results  Component Value Date   HGBA1C 5.6 03/31/2019   She is on thyroid medication. Her medication was not changed last visit. Synthroid 1 tab (150 mcg on Wednesday, and 1/2 tab all other days).   Lab Results  Component Value Date   TSH 0.69 07/14/2019   She has CKD IIIa monitored at this office. Last GFR: Lab Results  Component Value Date   GFRNONAA 57 (L) 07/14/2019   Patient is on Vitamin D supplement.   Lab Results  Component Value Date   VD25OH 90 03/31/2019      Medication Review: Current Outpatient Medications on File Prior to Visit  Medication Sig Dispense Refill  . Acetaminophen (TYLENOL PO) Take by mouth as needed.    Marland Kitchen aspirin (ASPIRIN 81) 81 MG EC tablet Take 1 tablet daily    . Cholecalciferol (VITAMIN D PO) Take 5,000 Units by mouth 2 (two) times daily.    . clotrimazole-betamethasone (LOTRISONE) cream Apply 1 application topically 2 (two) times daily. (Patient taking differently: Apply 1 application topically as needed. ) 30 g 1  . diazepam (VALIUM) 5 MG tablet Take 1/2-1 tablet 2 - 3 x /day ONLY if needed for Anxiety Attack &  limit to 5 days /week to avoid addiction 60 tablet 0  . predniSONE (DELTASONE) 1 MG tablet Take 2 tablets (2 mg total) by mouth daily with breakfast. 60 tablet 0  . Probiotic Product (PROBIOTIC PO) Take 1 tablet by mouth daily.    . simvastatin (ZOCOR) 20 MG tablet Take 1 tablet (20 mg total) by mouth daily. 90 tablet 1   No current facility-administered medications on  file prior to visit.    Allergies  Allergen Reactions  . Ciprofloxacin   . Epinephrine Other (See Comments)    Heart raced   . Etodolac Hives  . Synephrine     Palpitations   . Flagyl [Metronidazole Hcl] Rash    Current Problems (verified) Patient Active Problem List   Diagnosis Date Noted  . CKD (chronic kidney disease) stage 3, GFR 30-59 ml/min 07/15/2019  . PMR (polymyalgia rheumatica) ([place]) 10/26/2018  . Insomnia 10/22/2014  . Generalized anxiety disorder 10/22/2014  . Labile hypertension 02/14/2014  . Mixed hyperlipidemia 02/14/2014  . Vitamin D deficiency 02/14/2014  . Rhinitis, allergic 08/19/2011  . Diverticulosis 04/23/2011  . Colon polyps 04/23/2011  . PERSONAL HISTORY OF MALIGNANT NEOPLASM OF BREAST 12/07/2007  . Hypothyroidism 12/05/2007  . Osteoarthritis 12/05/2007  . Internal hemorrhoids 11/08/2002    Screening Tests Immunization History  Administered Date(s) Administered  . Influenza Split 12/01/2010  . Influenza, High Dose Seasonal PF 11/12/2015, 11/23/2016, 12/01/2017, 12/06/2018  . Influenza-Unspecified 10/31/2012, 11/29/2014  . PFIZER SARS-COV-2 Vaccination 05/13/2019, 06/06/2019  . Pneumococcal Conjugate-13 10/18/2013  . Pneumococcal Polysaccharide-23 03/20/2010  . Td 10/17/2012  Preventative care: Last colonoscopy: 2009, hx of polyps, done d/t age Mammogram:  08/2019 Bone Density:  04/2019 - forearm T -1.4   Prior vaccinations: TD or Tdap: 2014  Influenza: 2020  Pneumococcal: 2012 Prevnar13: 2015 Shingles/Zostavax: declines Covid 19: 2/2, 2021, pfizer   Names of Other Physician/Practitioners you currently use: 1. [facility] here for primary care 2. Dr. Katy Fitch, eye doctor, last visit 2021 3. Dr. Randol Kern, dentist, last visit 07/2017   Patient Care Team: Unk Pinto, MD as PCP - General (Internal Medicine) Melrose Nakayama, MD as Consulting Physician (Orthopedic Surgery) Deneise Lever, MD  as Consulting Physician (Pulmonary Disease) Inda Castle, MD (Inactive) as Consulting Physician (Gastroenterology) Carolan Clines, MD (Inactive) as Consulting Physician (Urology)  SURGICAL HISTORY She  has a past surgical history that includes Abdominal hysterectomy; bladder tac; Bunionectomy; right shoulder (2003); Wrist fracture surgery (2003); Cystocele repair (2009); Tonsillectomy; Knee arthroscopy (Left, 01/17/2013); Colonoscopy (2009); Eye surgery (Bilateral); Total knee arthroplasty (Right, 11/26/2015); and Breast lumpectomy (2004). FAMILY HISTORY Her family history includes Alzheimer's disease in her mother and sister; Breast cancer in her sister; Cancer (age of onset: 78) in her sister; Cancer (age of onset: 38) in her sister; Colon cancer in her son; Heart disease in her mother; Testicular cancer in her father. SOCIAL HISTORY She  reports that she has never smoked. She has never used smokeless tobacco. She reports current alcohol use. She reports that she does not use drugs.   MEDICARE WELLNESS OBJECTIVES: Physical activity:   Cardiac risk factors:   Depression/mood screen:   Depression screen [facility] 2/9 07/14/2019  Decreased Interest 0  Down, Depressed, Hopeless 1  PHQ - 2 Score 1    ADLs:  In your present state of health, do you have any difficulty performing the following activities: 12/19/2018 11/14/2018  Hearing? N N  Vision? N N  Difficulty concentrating or making decisions? N Y  Walking or climbing stairs? N N  Dressing or bathing? N N  Doing errands, shopping? N N  Some recent data might be hidden     Cognitive Testing  Alert? Yes  Normal Appearance?Yes  Oriented to person? Yes  Place? Yes   Time? Yes  Recall of three objects?  Yes  Can perform simple calculations? Yes  Displays appropriate judgment?Yes  Can read the correct time from a watch face?Yes  EOL planning:    Review of Systems  Constitutional: Negative for malaise/fatigue and weight loss.   HENT: Negative for hearing loss and tinnitus.   Eyes: Negative for blurred vision and double vision.  Respiratory: Negative for cough, sputum production, shortness of breath and wheezing.   Cardiovascular: Negative for chest pain, palpitations, orthopnea, claudication, leg swelling and PND.  Gastrointestinal: Negative for abdominal pain, blood in stool, constipation, diarrhea, heartburn, melena, nausea and vomiting.  Genitourinary: Negative.   Musculoskeletal: Positive for joint pain (knees). Negative for falls and myalgias.  Skin: Negative for rash.  Neurological: Negative for dizziness, tingling, sensory change, weakness and headaches.  Endo/Heme/Allergies: Negative for polydipsia.  Psychiatric/Behavioral: Negative.  Negative for depression, memory loss, substance abuse and suicidal ideas. The patient is not nervous/anxious and does not have insomnia.   All other systems reviewed and are negative.    Objective:     Today's Vitals   10/16/19 1121  BP: 134/62  Pulse: (!) 55  Temp: (!) 96.8 F (36 C)  SpO2: 97%  Weight: 145 lb (65.8 kg)   Body mass index is 24.13 kg/m.  General appearance: alert, no distress, WD/WN, female HEENT: normocephalic, sclerae anicteric, TMs pearly, nares patent, no discharge or erythema, pharynx normal Oral cavity: MMM, no lesions Neck: supple, no lymphadenopathy, no thyromegaly, no masses Heart: RRR, normal S1, S2, no murmurs Lungs: CTA bilaterally, no wheezes, rhonchi, or rales Abdomen: +bs, soft, non tender, non distended, no masses, no hepatomegaly, no splenomegaly Musculoskeletal: nontender, no swelling, no obvious deformity Extremities: no edema, no cyanosis, no clubbing Pulses: 2+ symmetric, upper and lower extremities, normal cap refill Neurological: alert, oriented x 3, CN2-12 intact, strength normal upper extremities and lower extremities, sensation normal throughout, DTRs 2+ throughout, no cerebellar signs, gait normal Psychiatric: normal  affect, behavior normal, pleasant   Medicare Attestation I have personally reviewed: The patient's medical and social history Their use of alcohol, tobacco or illicit drugs Their current medications and supplements The patient's functional ability including ADLs,fall risks, home safety risks, cognitive, and hearing and visual impairment Diet and physical activities Evidence for depression or mood disorders  The patient's weight, height, BMI, and visual acuity have been recorded in the chart.  I have made referrals, counseling, and provided education to the patient based on review of the above and I have provided the patient with a written personalized care plan for preventive services.     Izora Ribas, NP   10/16/2019

## 2019-10-16 ENCOUNTER — Other Ambulatory Visit: Payer: Self-pay

## 2019-10-16 ENCOUNTER — Ambulatory Visit (INDEPENDENT_AMBULATORY_CARE_PROVIDER_SITE_OTHER): Payer: Medicare HMO | Admitting: Adult Health

## 2019-10-16 ENCOUNTER — Encounter: Payer: Self-pay | Admitting: Adult Health

## 2019-10-16 VITALS — BP 134/62 | HR 55 | Temp 96.8°F | Wt 145.0 lb

## 2019-10-16 DIAGNOSIS — J309 Allergic rhinitis, unspecified: Secondary | ICD-10-CM | POA: Diagnosis not present

## 2019-10-16 DIAGNOSIS — Z0000 Encounter for general adult medical examination without abnormal findings: Secondary | ICD-10-CM

## 2019-10-16 DIAGNOSIS — R0989 Other specified symptoms and signs involving the circulatory and respiratory systems: Secondary | ICD-10-CM | POA: Diagnosis not present

## 2019-10-16 DIAGNOSIS — F325 Major depressive disorder, single episode, in full remission: Secondary | ICD-10-CM

## 2019-10-16 DIAGNOSIS — F411 Generalized anxiety disorder: Secondary | ICD-10-CM

## 2019-10-16 DIAGNOSIS — N1831 Chronic kidney disease, stage 3a: Secondary | ICD-10-CM | POA: Diagnosis not present

## 2019-10-16 DIAGNOSIS — E782 Mixed hyperlipidemia: Secondary | ICD-10-CM | POA: Diagnosis not present

## 2019-10-16 DIAGNOSIS — G4700 Insomnia, unspecified: Secondary | ICD-10-CM

## 2019-10-16 DIAGNOSIS — M159 Polyosteoarthritis, unspecified: Secondary | ICD-10-CM

## 2019-10-16 DIAGNOSIS — E559 Vitamin D deficiency, unspecified: Secondary | ICD-10-CM

## 2019-10-16 DIAGNOSIS — E039 Hypothyroidism, unspecified: Secondary | ICD-10-CM

## 2019-10-16 DIAGNOSIS — M353 Polymyalgia rheumatica: Secondary | ICD-10-CM

## 2019-10-16 DIAGNOSIS — K5790 Diverticulosis of intestine, part unspecified, without perforation or abscess without bleeding: Secondary | ICD-10-CM | POA: Diagnosis not present

## 2019-10-16 DIAGNOSIS — K635 Polyp of colon: Secondary | ICD-10-CM | POA: Diagnosis not present

## 2019-10-16 DIAGNOSIS — K648 Other hemorrhoids: Secondary | ICD-10-CM

## 2019-10-16 DIAGNOSIS — Z853 Personal history of malignant neoplasm of breast: Secondary | ICD-10-CM

## 2019-10-16 LAB — CBC WITH DIFFERENTIAL/PLATELET
Absolute Monocytes: 582 cells/uL (ref 200–950)
Basophils Absolute: 42 cells/uL (ref 0–200)
Basophils Relative: 0.7 %
Eosinophils Absolute: 120 cells/uL (ref 15–500)
Eosinophils Relative: 2 %
HCT: 39.9 % (ref 35.0–45.0)
Hemoglobin: 13.5 g/dL (ref 11.7–15.5)
Lymphs Abs: 1260 cells/uL (ref 850–3900)
MCH: 30.7 pg (ref 27.0–33.0)
MCHC: 33.8 g/dL (ref 32.0–36.0)
MCV: 90.7 fL (ref 80.0–100.0)
MPV: 10.3 fL (ref 7.5–12.5)
Monocytes Relative: 9.7 %
Neutro Abs: 3996 cells/uL (ref 1500–7800)
Neutrophils Relative %: 66.6 %
Platelets: 232 10*3/uL (ref 140–400)
RBC: 4.4 10*6/uL (ref 3.80–5.10)
RDW: 12.1 % (ref 11.0–15.0)
Total Lymphocyte: 21 %
WBC: 6 10*3/uL (ref 3.8–10.8)

## 2019-10-16 LAB — LIPID PANEL
Cholesterol: 168 mg/dL (ref <200)
HDL: 72 mg/dL (ref >=50)
LDL Cholesterol (Calc): 76 mg/dL (calc)
Non-HDL Cholesterol (Calc): 96 mg/dL (calc) (ref <130)
Total CHOL/HDL Ratio: 2.3 (calc) (ref <5.0)
Triglycerides: 115 mg/dL (ref <150)

## 2019-10-16 LAB — COMPLETE METABOLIC PANEL WITH GFR
AG Ratio: 2.5 (calc) (ref 1.0–2.5)
ALT: 12 U/L (ref 6–29)
AST: 15 U/L (ref 10–35)
Albumin: 4.2 g/dL (ref 3.6–5.1)
Alkaline phosphatase (APISO): 57 U/L (ref 37–153)
BUN: 19 mg/dL (ref 7–25)
CO2: 28 mmol/L (ref 20–32)
Calcium: 9.8 mg/dL (ref 8.6–10.4)
Chloride: 106 mmol/L (ref 98–110)
Creat: 0.82 mg/dL (ref 0.60–0.88)
GFR, Est African American: 72 mL/min/{1.73_m2} (ref >=60)
GFR, Est Non African American: 62 mL/min/{1.73_m2} (ref >=60)
Globulin: 1.7 g/dL (calc) — ABNORMAL LOW (ref 1.9–3.7)
Glucose, Bld: 82 mg/dL (ref 65–99)
Potassium: 4.3 mmol/L (ref 3.5–5.3)
Sodium: 141 mmol/L (ref 135–146)
Total Bilirubin: 1.2 mg/dL (ref 0.2–1.2)
Total Protein: 5.9 g/dL — ABNORMAL LOW (ref 6.1–8.1)

## 2019-10-16 LAB — TSH: TSH: 0.93 mIU/L (ref 0.40–4.50)

## 2019-10-16 LAB — MAGNESIUM: Magnesium: 2.1 mg/dL (ref 1.5–2.5)

## 2019-10-16 MED ORDER — LEVOTHYROXINE SODIUM 150 MCG PO TABS: ORAL_TABLET | ORAL | 1 refills | Status: DC

## 2019-10-16 NOTE — Patient Instructions (Signed)
Alexandria Davies , Thank you for taking time to come for your Medicare Wellness Visit. I appreciate your ongoing commitment to your health goals. Please review the following plan we discussed and let me know if I can assist you in the future.   These are the goals we discussed: Goals    . Blood Pressure < 140/90    . Exercise 150 min/wk Moderate Activity     Aim for 20-30 min daily       This is a list of the screening recommended for you and due dates:  Health Maintenance  Topic Date Due  . Flu Shot  10/01/2019  . Tetanus Vaccine  10/18/2022  . DEXA scan (bone density measurement)  Completed  . COVID-19 Vaccine  Completed  . Pneumonia vaccines  Completed     Exercising to Stay Healthy To become healthy and stay healthy, it is recommended that you do moderate-intensity and vigorous-intensity exercise. You can tell that you are exercising at a moderate intensity if your heart starts beating faster and you start breathing faster but can still hold a conversation. You can tell that you are exercising at a vigorous intensity if you are breathing much harder and faster and cannot hold a conversation while exercising. Exercising regularly is important. It has many health benefits, such as:  Improving overall fitness, flexibility, and endurance.  Increasing bone density.  Helping with weight control.  Decreasing body fat.  Increasing muscle strength.  Reducing stress and tension.  Improving overall health. How often should I exercise? Choose an activity that you enjoy, and set realistic goals. Your health care provider can help you make an activity plan that works for you. Exercise regularly as told by your health care provider. This may include:  Doing strength training two times a week, such as: ? Lifting weights. ? Using resistance bands. ? Push-ups. ? Sit-ups. ? Yoga.  Doing a certain intensity of exercise for a given amount of time. Choose from these options: ? A total of  150 minutes of moderate-intensity exercise every week. ? A total of 75 minutes of vigorous-intensity exercise every week. ? A mix of moderate-intensity and vigorous-intensity exercise every week. Children, pregnant women, people who have not exercised regularly, people who are overweight, and older adults may need to talk with a health care provider about what activities are safe to do. If you have a medical condition, be sure to talk with your health care provider before you start a new exercise program. What are some exercise ideas? Moderate-intensity exercise ideas include:  Walking 1 mile (1.6 km) in about 15 minutes.  Biking.  Hiking.  Golfing.  Dancing.  Water aerobics. Vigorous-intensity exercise ideas include:  Walking 4.5 miles (7.2 km) or more in about 1 hour.  Jogging or running 5 miles (8 km) in about 1 hour.  Biking 10 miles (16.1 km) or more in about 1 hour.  Lap swimming.  Roller-skating or in-line skating.  Cross-country skiing.  Vigorous competitive sports, such as football, basketball, and soccer.  Jumping rope.  Aerobic dancing. What are some everyday activities that can help me to get exercise?  [place] work, such as: ? Pushing a Conservation officer, nature. ? Raking and bagging leaves.  Washing your car.  Pushing a stroller.  Shoveling snow.  Gardening.  Washing windows or floors. How can I be more active in my day-to-day activities?  Use stairs instead of an elevator.  Take a walk during your lunch break.  If you drive, park  your car farther away from your work or school.  If you take public transportation, get off one stop early and walk the rest of the way.  Stand up or walk around during all of your indoor phone calls.  Get up, stretch, and walk around every 30 minutes throughout the day.  Enjoy exercise with a friend. Support to continue exercising will help you keep a regular routine of activity. What guidelines can I follow while  exercising?  Before you start a new exercise program, talk with your health care provider.  Do not exercise so much that you hurt yourself, feel dizzy, or get very short of breath.  Wear comfortable clothes and wear shoes with good support.  Drink plenty of water while you exercise to prevent dehydration or heat stroke.  Work out until your breathing and your heartbeat get faster. Where to find more information  U.S. Department of Health and Human Services: [employer]at  Centers for Disease Control and Prevention (CDC): http://www.wolf.info/ Summary  Exercising regularly is important. It will improve your overall fitness, flexibility, and endurance.  Regular exercise also will improve your overall health. It can help you control your weight, reduce stress, and improve your bone density.  Do not exercise so much that you hurt yourself, feel dizzy, or get very short of breath.  Before you start a new exercise program, talk with your health care provider. This information is not intended to replace advice given to you by your health care provider. Make sure you discuss any questions you have with your health care provider. Document Revised: 01/29/2017 Document Reviewed: 01/07/2017 Elsevier Patient Education  2020 [employer].

## 2019-10-31 ENCOUNTER — Telehealth: Payer: Self-pay | Admitting: Rheumatology

## 2019-10-31 MED ORDER — PREDNISONE 1 MG PO TABS: 1.0000 mg | ORAL_TABLET | Freq: Every day | ORAL | 0 refills | Status: DC

## 2019-10-31 NOTE — Telephone Encounter (Signed)
Patient called you back. Per patient just finished 2 mg dose, now to proceed to 1 mg Prednisone.

## 2019-10-31 NOTE — Telephone Encounter (Signed)
Last Visit: 10/09/2019 Next Visit: 01/09/2020  Attempted to contact the patient and left message for patient to call the office.  Need to verify patient's dose of Prednisone.

## 2019-10-31 NOTE — Telephone Encounter (Signed)
Patient called requesting prescription refill of Prednisone to be sent to [facility].  Patient states she is decreasing her dosage to 1 mg per day for 1 month.  Patient states she currently has 8 pills remaining, but wants to order early since she didn't receive her last prescription on time.

## 2019-10-31 NOTE — Telephone Encounter (Signed)
Okay to refill per Dr. Deveshwar.  

## 2019-10-31 NOTE — Addendum Note (Signed)
Addended by: Carole Binning on: 10/31/2019 01:24 PM   Modules accepted: Orders

## 2019-11-07 ENCOUNTER — Other Ambulatory Visit: Payer: Self-pay | Admitting: Physician Assistant

## 2019-11-09 ENCOUNTER — Telehealth: Payer: Self-pay | Admitting: Rheumatology

## 2019-11-09 NOTE — Telephone Encounter (Signed)
She can continue tapering prednisone as recommended and proceed with receiving the influenza vaccine.

## 2019-11-09 NOTE — Telephone Encounter (Signed)
Left message to advise patient she can continue tapering prednisone as recommended and proceed with receiving the influenza vaccine.

## 2019-11-09 NOTE — Telephone Encounter (Signed)
Patient called requesting a return call to let her know if it is okay for her to have her flu shot while she is still taking Prednisone.  Patient states she should be finishing her current prescription October 6.

## 2019-12-12 ENCOUNTER — Ambulatory Visit (INDEPENDENT_AMBULATORY_CARE_PROVIDER_SITE_OTHER): Payer: Medicare HMO | Admitting: *Deleted

## 2019-12-12 ENCOUNTER — Other Ambulatory Visit: Payer: Self-pay

## 2019-12-12 VITALS — Temp 96.1°F

## 2019-12-12 DIAGNOSIS — Z23 Encounter for immunization: Secondary | ICD-10-CM

## 2019-12-21 ENCOUNTER — Other Ambulatory Visit: Payer: Self-pay | Admitting: Internal Medicine

## 2019-12-21 MED ORDER — DIAZEPAM 5 MG PO TABS: ORAL_TABLET | ORAL | 0 refills | Status: DC

## 2019-12-26 NOTE — Progress Notes (Signed)
Office Visit Note  Patient: Alexandria Davies             Date of Birth: 02-21-1926           MRN: [id number]             PCP: Unk Pinto, MD Referring: Unk Pinto, MD Visit Date: 01/09/2020 Occupation: @GUAROCC @  Subjective:  Discuss discontinuing prednisone   History of Present Illness: Alexandria Davies is a 86 y.o. female with history of polymyalgia rheumatica and osteopenia.  Patient tapered off of prednisone on 12/06/2019.  She has not had any recurrence of symptoms of polymyalgia rheumatica.  She is not experiencing any increased myalgias, muscle weakness, or muscle tenderness at this time.  She has no difficulty raising her arms above her head or arising from a seated position.  She has been experiencing discomfort in the left knee joint and had a recent cortisone injection.  Her right knee replacement causes occasional discomfort.  She has occasional discomfort and stiffness in both hands but denies any joint swelling.  She has noticed increased fatigue and a decreased appetite since discontinuing prednisone.   Activities of Daily Living:  Patient reports morning stiffness for 0 minutes.   Patient Denies nocturnal pain.  Difficulty dressing/grooming: Denies Difficulty climbing stairs: Reports Difficulty getting out of chair: Denies Difficulty using hands for taps, buttons, cutlery, and/or writing: Reports  Review of Systems  Constitutional: Positive for fatigue.  HENT: Negative for mouth sores, mouth dryness and nose dryness.   Eyes: Negative for pain, itching and dryness.  Respiratory: Negative for shortness of breath and difficulty breathing.   Cardiovascular: Negative for chest pain and palpitations.  Gastrointestinal: Positive for diarrhea. Negative for blood in stool and constipation.  Endocrine: Negative for increased urination.  Genitourinary: Negative for difficulty urinating and painful urination.  Musculoskeletal: Positive for arthralgias, joint pain  and morning stiffness. Negative for joint swelling, myalgias, muscle tenderness and myalgias.  Skin: Negative for color change, rash and redness.  Allergic/Immunologic: Negative for susceptible to infections.  Neurological: Positive for weakness. Negative for dizziness, numbness and headaches.  Hematological: Negative for bruising/bleeding tendency.  Psychiatric/Behavioral: Negative for confusion and sleep disturbance.    PMFS History:  Patient Active Problem List   Diagnosis Date Noted  . Major depression in remission ([place]) 10/16/2019  . CKD (chronic kidney disease) stage 3, GFR 30-59 ml/min ([facility]) 07/15/2019  . PMR (polymyalgia rheumatica) ([place]) 10/26/2018  . Insomnia 10/22/2014  . Generalized anxiety disorder 10/22/2014  . Labile hypertension 02/14/2014  . Mixed hyperlipidemia 02/14/2014  . Vitamin D deficiency 02/14/2014  . Rhinitis, allergic 08/19/2011  . Diverticulosis 04/23/2011  . Colon polyps 04/23/2011  . PERSONAL HISTORY OF MALIGNANT NEOPLASM OF BREAST 12/07/2007  . Hypothyroidism 12/05/2007  . Osteoarthritis 12/05/2007  . Internal hemorrhoids 11/08/2002    Past Medical History:  Diagnosis Date  . Anxiety   . Breast cancer ([place])    left  . Colon polyp 2009   BENIGN POLYPOID  . Diverticulosis of colon (without mention of hemorrhage) 2009  . DJD (degenerative joint disease)   . Family history of malignant neoplasm of gastrointestinal tract 05/06/2011  . HOH (hard of hearing)    Right ear  . Hypercholesteremia   . Hypothyroidism   . IBS (irritable bowel syndrome)   . Internal hemorrhoids without mention of complication   . Primary osteoarthritis of right knee 11/26/2015   S/p TKA  . Sinus drainage   . Vitiligo   . Wears dentures  top  . Wears glasses     Family History  Problem Relation Age of Onset  . Alzheimer's disease Sister   . Cancer Sister 53       breast  . Breast cancer Sister   . Cancer Sister 25       breast cancer  . Colon cancer Son   .  Heart disease Mother        died at age 75  . Alzheimer's disease Mother   . Testicular cancer Father    Past Surgical History:  Procedure Laterality Date  . ABDOMINAL HYSTERECTOMY    . bladder tac    . BREAST LUMPECTOMY  2004   left lump-snbx  . BUNIONECTOMY     bilateral  . COLONOSCOPY  2009   several  . CYSTOCELE REPAIR  2009  . EYE SURGERY Bilateral    Cataract removal  . KNEE ARTHROSCOPY Left 01/17/2013   Procedure: ARTHROSCOPY LEFT KNEE;  Surgeon: Hessie Dibble, MD;  Location: [facility];  Service: Orthopedics;  Laterality: Left;  partial medial and partial lateral chondroplasty and removal loose body  . right shoulder  2003   bone spur removed-rcr  . TONSILLECTOMY    . TOTAL KNEE ARTHROPLASTY Right 11/26/2015   Procedure: TOTAL KNEE ARTHROPLASTY;  Surgeon: Melrose Nakayama, MD;  Location: [facility];  Service: Orthopedics;  Laterality: Right;  . WRIST FRACTURE SURGERY  2003   left-lipoma   Social History   Social History Narrative  . Not on file   Immunization History  Administered Date(s) Administered  . Influenza Split 12/01/2010  . Influenza, High Dose Seasonal PF 11/12/2015, 11/23/2016, 12/01/2017, 12/06/2018, 12/12/2019  . Influenza-Unspecified 10/31/2012, 11/29/2014  . PFIZER SARS-COV-2 Vaccination 05/13/2019, 06/06/2019  . Pneumococcal Conjugate-13 10/18/2013  . Pneumococcal Polysaccharide-23 03/20/2010  . Td 10/17/2012     Objective: Vital Signs: BP 136/79 (BP Location: Left Arm, Patient Position: Sitting, Cuff Size: Normal)   Pulse (!) 57   Resp 13   Ht 5\' 5"  (1.651 m)   Wt 141 lb 3.2 oz (64 kg)   BMI 23.50 kg/m    Physical Exam Vitals and nursing note reviewed.  Constitutional:      Appearance: She is well-developed.  HENT:     Head: Normocephalic and atraumatic.  Eyes:     Conjunctiva/sclera: Conjunctivae normal.  Cardiovascular:     Rate and Rhythm: Normal rate.  Pulmonary:     Effort: Pulmonary effort is normal.    Abdominal:     Palpations: Abdomen is soft.  Musculoskeletal:     Cervical back: Normal range of motion.  Skin:    General: Skin is warm and dry.     Capillary Refill: Capillary refill takes less than 2 seconds.  Neurological:     Mental Status: She is alert and oriented to person, place, and time.  Psychiatric:        Behavior: Behavior normal.      Musculoskeletal Exam: C-spine, thoracic spine, and lumbar spine good ROM.  Shoulder joints, elbow joints, wrist joints, MCPs, PIPs, and DIPs good ROM with no synovitis.  Complete fist formation bilaterally.  PIP and DIP thickening consistent with osteoarthritis of both hands.  Hip joints good ROM with no discomfort.  Right knee replacement has good ROM with no discomfort.  Warmth of the right knee noted.  Left knee joint good ROM with no warmth or effusion.  Ankle joints good ROM with no discomfort.  Tenderness over the left ankle and dorsal  spur.   CDAI Exam: CDAI Score: -- Patient Global: --; Provider Global: -- Swollen: --; Tender: -- Joint Exam 01/09/2020   No joint exam has been documented for this visit   There is currently no information documented on the homunculus. Go to the Rheumatology activity and complete the homunculus joint exam.  Investigation: No additional findings.  Imaging: No results found.  Recent Labs: Lab Results  Component Value Date   WBC 6.0 10/16/2019   HGB 13.5 10/16/2019   PLT 232 10/16/2019   NA 141 10/16/2019   K 4.3 10/16/2019   CL 106 10/16/2019   CO2 28 10/16/2019   GLUCOSE 82 10/16/2019   BUN 19 10/16/2019   CREATININE 0.82 10/16/2019   BILITOT 1.2 10/16/2019   ALKPHOS 62 04/23/2016   AST 15 10/16/2019   ALT 12 10/16/2019   PROT 5.9 (L) 10/16/2019   ALBUMIN 4.2 04/23/2016   CALCIUM 9.8 10/16/2019   GFRAA 72 10/16/2019    Speciality Comments: No specialty comments available.  Procedures:  No procedures performed Allergies: Ciprofloxacin, Epinephrine, Etodolac, Synephrine, and  Flagyl [metronidazole hcl]   Assessment / Plan:     Visit Diagnoses: Polymyalgia rheumatica ([place]) -She is not experiencing any signs or symptoms of a polymyalgia rheumatica flare at this time.  She discontinued prednisone on 12/06/2019 and has not had any recurrence of symptoms.  She is not experiencing any myalgias, muscle tenderness, or muscle weakness at this time.  She has no difficulty raising her arms above her head or rising from a seated position.  She has noticed some increased fatigue and generalized arthralgias since discontinuing prednisone.  We discussed the importance of regular exercise.  She was advised to notify us if she develop signs or symptoms of a flare.  She will follow-up in the office in 5 months..   Elevated C-reactive protein (CRP)  Status post total right knee replacement -  She is followed by Dr. Rhona Raider.  Doing well.  She has good range of motion with warmth on exam.  She uses an Ace wrap for compression which alleviate some of her discomfort.  Ischial bursitis of left side - Resolved  Positive ANA (antinuclear antibody) - ANA 1: 80 NH.  She has no clinical features of autoimmune disease.  Osteopenia of multiple sites - DEXA 04/05/19: The BMD measured at Forearm Radius 33% is 0.759 g/cm2 w/ T-score of -1.4. Repeat DEXA in February 2023.  She has not had any recent falls or fractures.  She is taking vitamin D 10,000 units daily.   Vitamin D deficiency: She is taking vitamin D 10,000 units daily.  Other medical conditions are listed as follows:  History of hypothyroidism  History of anxiety  Dyslipidemia   Orders: No orders of the defined types were placed in this encounter.  No orders of the defined types were placed in this encounter.    Follow-Up Instructions: Return in about 5 months (around 06/08/2020) for Polymyalgia Rheumatica.   Ofilia Neas, PA-C  Note - This record has been created using Dragon software.  Chart creation errors have been  sought, but may not always  have been located. Such creation errors do not reflect on  the standard of medical care.

## 2020-01-03 DIAGNOSIS — M1712 Unilateral primary osteoarthritis, left knee: Secondary | ICD-10-CM | POA: Diagnosis not present

## 2020-01-09 ENCOUNTER — Ambulatory Visit (INDEPENDENT_AMBULATORY_CARE_PROVIDER_SITE_OTHER): Payer: Medicare HMO | Admitting: Physician Assistant

## 2020-01-09 ENCOUNTER — Encounter: Payer: Self-pay | Admitting: Physician Assistant

## 2020-01-09 ENCOUNTER — Other Ambulatory Visit: Payer: Self-pay

## 2020-01-09 VITALS — BP 136/79 | HR 57 | Resp 13 | Ht 65.0 in | Wt 141.2 lb

## 2020-01-09 DIAGNOSIS — Z96651 Presence of right artificial knee joint: Secondary | ICD-10-CM

## 2020-01-09 DIAGNOSIS — M8589 Other specified disorders of bone density and structure, multiple sites: Secondary | ICD-10-CM

## 2020-01-09 DIAGNOSIS — R69 Illness, unspecified: Secondary | ICD-10-CM | POA: Diagnosis not present

## 2020-01-09 DIAGNOSIS — E559 Vitamin D deficiency, unspecified: Secondary | ICD-10-CM

## 2020-01-09 DIAGNOSIS — R7982 Elevated C-reactive protein (CRP): Secondary | ICD-10-CM | POA: Diagnosis not present

## 2020-01-09 DIAGNOSIS — E785 Hyperlipidemia, unspecified: Secondary | ICD-10-CM | POA: Diagnosis not present

## 2020-01-09 DIAGNOSIS — Z8639 Personal history of other endocrine, nutritional and metabolic disease: Secondary | ICD-10-CM

## 2020-01-09 DIAGNOSIS — Z8659 Personal history of other mental and behavioral disorders: Secondary | ICD-10-CM

## 2020-01-09 DIAGNOSIS — M7072 Other bursitis of hip, left hip: Secondary | ICD-10-CM

## 2020-01-09 DIAGNOSIS — M353 Polymyalgia rheumatica: Secondary | ICD-10-CM

## 2020-01-09 DIAGNOSIS — R768 Other specified abnormal immunological findings in serum: Secondary | ICD-10-CM

## 2020-01-09 NOTE — Patient Instructions (Signed)
Hand Exercises Hand exercises can be helpful for almost anyone. These exercises can strengthen the hands, improve flexibility and movement, and increase blood flow to the hands. These results can make work and daily tasks easier. Hand exercises can be especially helpful for people who have joint pain from arthritis or have nerve damage from overuse (carpal tunnel syndrome). These exercises can also help people who have injured a hand. Exercises Most of these hand exercises are gentle stretching and motion exercises. It is usually safe to do them often throughout the day. Warming up your hands before exercise may help to reduce stiffness. You can do this with gentle massage or by placing your hands in warm water for 10-15 minutes. It is normal to feel some stretching, pulling, tightness, or mild discomfort as you begin new exercises. This will gradually improve. Stop an exercise right away if you feel sudden, severe pain or your pain gets worse. Ask your health care provider which exercises are best for you. Knuckle bend or "claw" fist 1. Stand or sit with your arm, hand, and all five fingers pointed straight up. Make sure to keep your wrist straight during the exercise. 2. Gently bend your fingers down toward your palm until the tips of your fingers are touching the top of your palm. Keep your big knuckle straight and just bend the small knuckles in your fingers. 3. Hold this position for __________ seconds. 4. Straighten (extend) your fingers back to the starting position. Repeat this exercise 5-10 times with each hand. Full finger fist 1. Stand or sit with your arm, hand, and all five fingers pointed straight up. Make sure to keep your wrist straight during the exercise. 2. Gently bend your fingers into your palm until the tips of your fingers are touching the middle of your palm. 3. Hold this position for __________ seconds. 4. Extend your fingers back to the starting position, stretching every  joint fully. Repeat this exercise 5-10 times with each hand. Straight fist 1. Stand or sit with your arm, hand, and all five fingers pointed straight up. Make sure to keep your wrist straight during the exercise. 2. Gently bend your fingers at the big knuckle, where your fingers meet your hand, and the middle knuckle. Keep the knuckle at the tips of your fingers straight and try to touch the bottom of your palm. 3. Hold this position for __________ seconds. 4. Extend your fingers back to the starting position, stretching every joint fully. Repeat this exercise 5-10 times with each hand. Tabletop 1. Stand or sit with your arm, hand, and all five fingers pointed straight up. Make sure to keep your wrist straight during the exercise. 2. Gently bend your fingers at the big knuckle, where your fingers meet your hand, as far down as you can while keeping the small knuckles in your fingers straight. Think of forming a tabletop with your fingers. 3. Hold this position for __________ seconds. 4. Extend your fingers back to the starting position, stretching every joint fully. Repeat this exercise 5-10 times with each hand. Finger spread 1. Place your hand flat on a table with your palm facing down. Make sure your wrist stays straight as you do this exercise. 2. Spread your fingers and thumb apart from each other as far as you can until you feel a gentle stretch. Hold this position for __________ seconds. 3. Bring your fingers and thumb tight together again. Hold this position for __________ seconds. Repeat this exercise 5-10 times with each hand.   Making circles 1. Stand or sit with your arm, hand, and all five fingers pointed straight up. Make sure to keep your wrist straight during the exercise. 2. Make a circle by touching the tip of your thumb to the tip of your index finger. 3. Hold for __________ seconds. Then open your hand wide. 4. Repeat this motion with your thumb and each finger on your  hand. Repeat this exercise 5-10 times with each hand. Thumb motion 1. Sit with your forearm resting on a table and your wrist straight. Your thumb should be facing up toward the ceiling. Keep your fingers relaxed as you move your thumb. 2. Lift your thumb up as high as you can toward the ceiling. Hold for __________ seconds. 3. Bend your thumb across your palm as far as you can, reaching the tip of your thumb for the small finger (pinkie) side of your palm. Hold for __________ seconds. Repeat this exercise 5-10 times with each hand. Grip strengthening  1. Hold a stress ball or other soft ball in the middle of your hand. 2. Slowly increase the pressure, squeezing the ball as much as you can without causing pain. Think of bringing the tips of your fingers into the middle of your palm. All of your finger joints should bend when doing this exercise. 3. Hold your squeeze for __________ seconds, then relax. Repeat this exercise 5-10 times with each hand. Contact a health care provider if:  Your hand pain or discomfort gets much worse when you do an exercise.  Your hand pain or discomfort does not improve within 2 hours after you exercise. If you have any of these problems, stop doing these exercises right away. Do not do them again unless your health care provider says that you can. Get help right away if:  You develop sudden, severe hand pain or swelling. If this happens, stop doing these exercises right away. Do not do them again unless your health care provider says that you can. This information is not intended to replace advice given to you by your health care provider. Make sure you discuss any questions you have with your health care provider. Document Revised: 06/09/2018 Document Reviewed: 02/17/2018 [employer] Patient Education  2020 [employer]  

## 2020-01-12 DIAGNOSIS — S8011XA Contusion of right lower leg, initial encounter: Secondary | ICD-10-CM | POA: Diagnosis not present

## 2020-01-15 DIAGNOSIS — Z23 Encounter for immunization: Secondary | ICD-10-CM | POA: Diagnosis not present

## 2020-01-16 ENCOUNTER — Encounter: Payer: Medicare Other | Admitting: Internal Medicine

## 2020-01-22 ENCOUNTER — Other Ambulatory Visit: Payer: Self-pay | Admitting: Internal Medicine

## 2020-01-22 MED ORDER — SIMVASTATIN 20 MG PO TABS: ORAL_TABLET | ORAL | 0 refills | Status: DC

## 2020-02-05 ENCOUNTER — Encounter: Payer: Self-pay | Admitting: Internal Medicine

## 2020-02-05 NOTE — Progress Notes (Signed)
Annual Screening/Preventative Visit & Comprehensive Evaluation &  Examination      This very nice 86 y.o.  [name] presents for a Screening /Preventative Visit & comprehensive evaluation and management of multiple medical co-morbidities.  Patient has been followed for HTN, HLD, Hypothyroidism, Prediabetes, IBS-D and Vitamin D Deficiency. Patient has Aortic atherosclerosis by LS spine XR  (11/2018).      Patient was dx'd in Oct 2020 with PMR and started ion Steroid By Dr Estanislado Pandy.  Patient relates today that she has been completely tapered off of prednisone 6 to 8 weeks.         Patient is followed expectantly with hx/o mild labile HTN . Patient's BP has been controlled at home and patient denies any cardiac symptoms as chest pain, palpitations, shortness of breath, dizziness or ankle swelling. Today's BP is at goal - 140/72.       Patient's hyperlipidemia is controlled with diet and Simvastatin. Patient denies myalgias or other medication SE's. Last lipids were at goal:  Lab Results  Component Value Date   CHOL 168 10/16/2019   HDL 72 10/16/2019   LDLCALC 76 10/16/2019   TRIG 115 10/16/2019   CHOLHDL 2.3 10/16/2019       Patient  has been on thyroid replacement since she was dx'd Hypothyroid in 2000.      Patient has been followed expectantly for glucose intolerance and patient denies reactive hypoglycemic symptoms, visual blurring, diabetic polys or paresthesias. Last A1c was normal & at goal:  Lab Results  Component Value Date   HGBA1C 5.6 03/31/2019       Finally, patient has history of Vitamin D Deficiency ("37" /2011) and last Vitamin D was at goal:  Lab Results  Component Value Date   VD25OH 90 03/31/2019    Current Outpatient Medications on File Prior to Visit  Medication Sig  . aspirin  81 MG EC tab Take 1 tablet daily  . VITAMIN D  5,000 Units Take 2 times daily.  Marland Kitchen LOTRISONE Apply 1 application topically 2  times daily.   . diazepam 5 MG tablet Take  1/2-1  tablet 2 - 3 x /day ONLY if needed  . levothyroxine  150 mcg Take 1/2-1   tablet daily   . Probiotic  Take 1 tablet  daily.  . Simvastatin 20 MG tablet Take    1 tablet    at Bedtime       Allergies  Allergen Reactions  . Ciprofloxacin   . Epinephrine Other (See Comments)    Heart raced   . Etodolac Hives  . Synephrine     Palpitations   . Flagyl [Metronidazole Hcl] Rash    Past Medical History:  Diagnosis Date  . Anxiety   . Breast cancer ([place])    left  . Colon polyp 2009   BENIGN POLYPOID  . Diverticulosis of colon (without mention of hemorrhage) 2009  . DJD (degenerative joint disease)   . Family history of malignant neoplasm of gastrointestinal tract 05/06/2011  . HOH (hard of hearing)    Right ear  . Hypercholesteremia   . Hypothyroidism   . IBS (irritable bowel syndrome)   . Internal hemorrhoids without mention of complication   . Primary osteoarthritis of right knee 11/26/2015   S/p TKA  . Sinus drainage   . Vitiligo   . Wears dentures    top  . Wears glasses    Health Maintenance  Topic Date Due  . TETANUS/TDAP  10/18/2022  .  INFLUENZA VACCINE  Completed  . DEXA SCAN  Completed  . COVID-19 Vaccine  Completed  . PNA vac Low Risk Adult  Completed   Immunization History  Administered Date(s) Administered  . Influenza Split 12/01/2010  . Influenza, High Dose Seasonal PF 11/12/2015, 11/23/2016, 12/01/2017, 12/06/2018, 12/12/2019  . Influenza-Unspecified 10/31/2012, 11/29/2014  . PFIZER SARS-COV-2 Vaccination 05/13/2019, 06/06/2019  . Pneumococcal Conjugate-13 10/18/2013  . Pneumococcal Polysaccharide-23 03/20/2010  . Td 10/17/2012   Last Colon - 2009 - No f/u due to age  Last MGM - 08/14/2019  Past Surgical History:  Procedure Laterality Date  . ABDOMINAL HYSTERECTOMY    . bladder tac    . BREAST LUMPECTOMY  2004   left lump-snbx  . BUNIONECTOMY     bilateral  . COLONOSCOPY  2009   several  . CYSTOCELE REPAIR  2009  . EYE SURGERY Bilateral      Cataract removal  . KNEE ARTHROSCOPY Left 01/17/2013   Procedure: ARTHROSCOPY LEFT KNEE;  Surgeon: Hessie Dibble, MD;  Location: [facility];  Service: Orthopedics;  Laterality: Left;  partial medial and partial lateral chondroplasty and removal loose body  . right shoulder  2003   bone spur removed-rcr  . TONSILLECTOMY    . TOTAL KNEE ARTHROPLASTY Right 11/26/2015   Procedure: TOTAL KNEE ARTHROPLASTY;  Surgeon: Melrose Nakayama, MD;  Location: [facility];  Service: Orthopedics;  Laterality: Right;  . WRIST FRACTURE SURGERY  2003   left-lipoma   Family History  Problem Relation Age of Onset  . Alzheimer's disease Sister   . Cancer Sister 69       breast  . Breast cancer Sister   . Cancer Sister 33       breast cancer  . Colon cancer Son   . Heart disease Mother        died at age 57  . Alzheimer's disease Mother   . Testicular cancer Father    Social History   Tobacco Use  . Smoking status: Never Smoker  . Smokeless tobacco: Never Used  Vaping Use  . Vaping Use: Never used  Substance Use Topics  . Alcohol use: Yes    Comment: ocassionally  . Drug use: No    ROS Constitutional: Denies fever, chills, weight loss/gain, headaches, insomnia,  night sweats, and change in appetite. Does c/o fatigue. Eyes: Denies redness, blurred vision, diplopia, discharge, itchy, watery eyes.  ENT: Denies discharge, congestion, post nasal drip, epistaxis, sore throat, earache, hearing loss, dental pain, Tinnitus, Vertigo, Sinus pain, snoring.  Cardio: Denies chest pain, palpitations, irregular heartbeat, syncope, dyspnea, diaphoresis, orthopnea, PND, claudication, edema Respiratory: denies cough, dyspnea, DOE, pleurisy, hoarseness, laryngitis, wheezing.  Gastrointestinal: Denies dysphagia, heartburn, reflux, water brash, pain, cramps, nausea, vomiting, bloating, diarrhea, constipation, hematemesis, melena, hematochezia, jaundice, hemorrhoids Genitourinary: Denies dysuria, frequency,  urgency, nocturia, hesitancy, discharge, hematuria, flank pain Breast: Breast lumps, nipple discharge, bleeding.  Musculoskeletal: Denies arthralgia, myalgia, stiffness, Jt. Swelling, pain, limp, and strain/sprain. Denies falls. Skin: Denies puritis, rash, hives, warts, acne, eczema, changing in skin lesion Neuro: No weakness, tremor, incoordination, spasms, paresthesia, pain Psychiatric: Denies confusion, memory loss, sensory loss. Denies Depression. Endocrine: Denies change in weight, skin, hair change, nocturia, and paresthesia, diabetic polys, visual blurring, hyper / hypo glycemic episodes.  Heme/Lymph: No excessive bleeding, bruising, enlarged lymph nodes.  Physical Exam  BP 140/72   Pulse (!) 57   Temp (!) 96.9 F (36.1 C)   Resp 16   Ht 5\' 4"  (1.626 m)  Wt 140 lb (63.5 kg)   SpO2 96%   BMI 24.03 kg/m   General Appearance: Well nourished, well groomed and in no apparent distress.  Eyes: PERRLA, EOMs, conjunctiva no swelling or erythema, normal fundi and vessels. Sinuses: No frontal/maxillary tenderness ENT/Mouth: EACs patent / TMs  nl. Nares clear without erythema, swelling, mucoid exudates. Oral hygiene is good. No erythema, swelling, or exudate. Tongue normal, non-obstructing. Tonsils not swollen or erythematous. Hearing normal.  Neck: Supple, thyroid not palpable. No bruits, nodes or JVD. Respiratory: Respiratory effort normal.  BS equal and clear bilateral without rales, rhonci, wheezing or stridor. Cardio: Heart sounds are normal with regular rate and rhythm and no murmurs, rubs or gallops. Peripheral pulses are normal and equal bilaterally without edema. No aortic or femoral bruits. Chest: symmetric with normal excursions and percussion. Breasts: Symmetric, without lumps, nipple discharge, retractions, or fibrocystic changes.  Abdomen: Flat, soft with bowel sounds active. Nontender, no guarding, rebound, hernias, masses, or organomegaly.  Lymphatics: Non tender without  lymphadenopathy.  Genitourinary:  Musculoskeletal: Full ROM all peripheral extremities, joint stability, 5/5 strength, and normal gait. Skin: Warm and dry without rashes, lesions, cyanosis, clubbing or  ecchymosis.  Neuro: Cranial nerves intact, reflexes equal bilaterally. Normal muscle tone, no cerebellar symptoms. Sensation intact.  Pysch: Alert and oriented x 3, normal affect, Insight and Judgment appropriate.   Assessment and Plan  1. Annual Preventative Screening Examination   2. Labile hypertension  - EKG 12-Lead - Urinalysis, Routine w reflex microscopic - Microalbumin / creatinine urine ratio - CBC with Differential/Platelet - COMPLETE METABOLIC PANEL WITH GFR - Magnesium - TSH  3. Hyperlipidemia, mixed  - EKG 12-Lead - Lipid panel - TSH  4. Abnormal glucose  - EKG 12-Lead - Hemoglobin A1c - Insulin, random  5. Vitamin D deficiency  - VITAMIN D 25 Hydroxyl  6. Hypothyroidism, unspecified type  - TSH  7. PMR (polymyalgia rheumatica) (HCC)   8. Screening for colorectal cancer  - POC Hemoccult Bld/Stl   9. Screening for ischemic heart disease  - EKG 12-Lead  10. FHx: heart disease  - EKG 12-Lead  11. Aortic atherosclerosis ([place]) by LS spine XR 11/2018  - EKG 12-Lead  12. Medication management  - Urinalysis, Routine w reflex microscopic - Microalbumin / creatinine urine ratio - CBC with Differential/Platelet - COMPLETE METABOLIC PANEL WITH GFR - Magnesium - Lipid panel - TSH - Hemoglobin A1c - Insulin, random - VITAMIN D 25 Hydroxy          Patient was counseled in prudent diet to achieve/maintain BMI less than 25 for weight control, BP monitoring, regular exercise and medications. Discussed med's effects and SE's. Screening labs and tests as requested with regular follow-up as recommended. Over 40 minutes of exam, counseling, chart review and high complex critical decision making was performed.   Kirtland Bouchard, MD

## 2020-02-05 NOTE — Progress Notes (Incomplete)
Annual Screening/Preventative Visit & Comprehensive Evaluation &  Examination      This very nice 86 y.o.  [name] presents for a Screening /Preventative Visit & comprehensive evaluation and management of multiple medical co-morbidities.  Patient has been followed for HTN, HLD, Hypothyroidism, Prediabetes, IBS-D and Vitamin D Deficiency. Patient was dx'd in Oct 2020 with PMR and started ion Steroid By Dr Estanislado Pandy       Patient is followed expectantly with hx/o mild labile HTN . Patient's BP has been controlled at home and patient denies any cardiac symptoms as chest pain, palpitations, shortness of breath, dizziness or ankle swelling. Today's         Patient's hyperlipidemia is controlled with diet and Simvastatin. Patient denies myalgias or other medication SE's. Last lipids were at goal:  Lab Results  Component Value Date   CHOL 168 10/16/2019   HDL 72 10/16/2019   LDLCALC 76 10/16/2019   TRIG 115 10/16/2019   CHOLHDL 2.3 10/16/2019       Patient  has been on thyroid replacement since she was dx'd Hypothyroid in 2000.      Patient has been followed expectantly for glucose intoleranc and patient denies reactive hypoglycemic symptoms, visual blurring, diabetic polys or paresthesias. Last A1c was normal & at goal:  Lab Results  Component Value Date   HGBA1C 5.6 03/31/2019        Finally, patient has history of Vitamin D Deficiency ("37" /2011) and last Vitamin D was at goal:  Lab Results  Component Value Date   VD25OH 90 03/31/2019    Current Outpatient Medications on File Prior to Visit  Medication Sig  . aspirin (ASPIRIN 81) 81 MG EC tablet Take 1 tablet daily  . Cholecalciferol (VITAMIN D PO) Take 5,000 Units by mouth 2 (two) times daily.  . clotrimazole-betamethasone (LOTRISONE) cream Apply 1 application topically 2 (two) times daily. (Patient taking differently: Apply 1 application topically as needed. )  . diazepam (VALIUM) 5 MG tablet Take      1/2-1 tablet        2 - 3 x /day       ONLY      if needed for Anxiety Attack &  limit to 5 days /week to avoid addiction  . levothyroxine (SYNTHROID) 150 MCG tablet Take 1/2-1 tablet daily as directed on an empty stomach with only water for 30 minutes & no Antacid meds, Calcium or Magnesium for 4 hours & avoid Biotin  . Probiotic Product (PROBIOTIC PO) Take 1 tablet by mouth daily.  . simvastatin (ZOCOR) 20 MG tablet Take    1 tablet    at Bedtime      for Cholesterol   No current facility-administered medications on file prior to visit.    Allergies  Allergen Reactions  . Ciprofloxacin   . Epinephrine Other (See Comments)    Heart raced   . Etodolac Hives  . Synephrine     Palpitations   . Flagyl [Metronidazole Hcl] Rash    Past Medical History:  Diagnosis Date  . Anxiety   . Breast cancer ([place])    left  . Colon polyp 2009   BENIGN POLYPOID  . Diverticulosis of colon (without mention of hemorrhage) 2009  . DJD (degenerative joint disease)   . Family history of malignant neoplasm of gastrointestinal tract 05/06/2011  . HOH (hard of hearing)    Right ear  . Hypercholesteremia   . Hypothyroidism   . IBS (irritable bowel syndrome)   .  Internal hemorrhoids without mention of complication   . Primary osteoarthritis of right knee 11/26/2015   S/p TKA  . Sinus drainage   . Vitiligo   . Wears dentures    top  . Wears glasses    Health Maintenance  Topic Date Due  . TETANUS/TDAP  10/18/2022  . INFLUENZA VACCINE  Completed  . DEXA SCAN  Completed  . COVID-19 Vaccine  Completed  . PNA vac Low Risk Adult  Completed   Immunization History  Administered Date(s) Administered  . Influenza Split 12/01/2010  . Influenza, High Dose Seasonal PF 11/12/2015, 11/23/2016, 12/01/2017, 12/06/2018, 12/12/2019  . Influenza-Unspecified 10/31/2012, 11/29/2014  . PFIZER SARS-COV-2 Vaccination 05/13/2019, 06/06/2019  . Pneumococcal Conjugate-13 10/18/2013  . Pneumococcal Polysaccharide-23 03/20/2010  . Td  10/17/2012    Last Colon - 2009 - No f/u due to age  Last MGM -   Past Surgical History:  Procedure Laterality Date  . ABDOMINAL HYSTERECTOMY    . bladder tac    . BREAST LUMPECTOMY  2004   left lump-snbx  . BUNIONECTOMY     bilateral  . COLONOSCOPY  2009   several  . CYSTOCELE REPAIR  2009  . EYE SURGERY Bilateral    Cataract removal  . KNEE ARTHROSCOPY Left 01/17/2013   Procedure: ARTHROSCOPY LEFT KNEE;  Surgeon: Hessie Dibble, MD;  Location: [facility];  Service: Orthopedics;  Laterality: Left;  partial medial and partial lateral chondroplasty and removal loose body  . right shoulder  2003   bone spur removed-rcr  . TONSILLECTOMY    . TOTAL KNEE ARTHROPLASTY Right 11/26/2015   Procedure: TOTAL KNEE ARTHROPLASTY;  Surgeon: Melrose Nakayama, MD;  Location: [facility];  Service: Orthopedics;  Laterality: Right;  . WRIST FRACTURE SURGERY  2003   left-lipoma   Family History  Problem Relation Age of Onset  . Alzheimer's disease Sister   . Cancer Sister 77       breast  . Breast cancer Sister   . Cancer Sister 41       breast cancer  . Colon cancer Son   . Heart disease Mother        died at age 60  . Alzheimer's disease Mother   . Testicular cancer Father    Social History   Tobacco Use  . Smoking status: Never Smoker  . Smokeless tobacco: Never Used  Vaping Use  . Vaping Use: Never used  Substance Use Topics  . Alcohol use: Yes    Comment: ocassionally  . Drug use: No    ROS Constitutional: Denies fever, chills, weight loss/gain, headaches, insomnia,  night sweats, and change in appetite. Does c/o fatigue. Eyes: Denies redness, blurred vision, diplopia, discharge, itchy, watery eyes.  ENT: Denies discharge, congestion, post nasal drip, epistaxis, sore throat, earache, hearing loss, dental pain, Tinnitus, Vertigo, Sinus pain, snoring.  Cardio: Denies chest pain, palpitations, irregular heartbeat, syncope, dyspnea, diaphoresis, orthopnea, PND,  claudication, edema Respiratory: denies cough, dyspnea, DOE, pleurisy, hoarseness, laryngitis, wheezing.  Gastrointestinal: Denies dysphagia, heartburn, reflux, water brash, pain, cramps, nausea, vomiting, bloating, diarrhea, constipation, hematemesis, melena, hematochezia, jaundice, hemorrhoids Genitourinary: Denies dysuria, frequency, urgency, nocturia, hesitancy, discharge, hematuria, flank pain Breast: Breast lumps, nipple discharge, bleeding.  Musculoskeletal: Denies arthralgia, myalgia, stiffness, Jt. Swelling, pain, limp, and strain/sprain. Denies falls. Skin: Denies puritis, rash, hives, warts, acne, eczema, changing in skin lesion Neuro: No weakness, tremor, incoordination, spasms, paresthesia, pain Psychiatric: Denies confusion, memory loss, sensory loss. Denies Depression. Endocrine: Denies change in weight,  skin, hair change, nocturia, and paresthesia, diabetic polys, visual blurring, hyper / hypo glycemic episodes.  Heme/Lymph: No excessive bleeding, bruising, enlarged lymph nodes.  Physical Exam  There were no vitals taken for this visit.  General Appearance: Well nourished, well groomed and in no apparent distress.  Eyes: PERRLA, EOMs, conjunctiva no swelling or erythema, normal fundi and vessels. Sinuses: No frontal/maxillary tenderness ENT/Mouth: EACs patent / TMs  nl. Nares clear without erythema, swelling, mucoid exudates. Oral hygiene is good. No erythema, swelling, or exudate. Tongue normal, non-obstructing. Tonsils not swollen or erythematous. Hearing normal.  Neck: Supple, thyroid not palpable. No bruits, nodes or JVD. Respiratory: Respiratory effort normal.  BS equal and clear bilateral without rales, rhonci, wheezing or stridor. Cardio: Heart sounds are normal with regular rate and rhythm and no murmurs, rubs or gallops. Peripheral pulses are normal and equal bilaterally without edema. No aortic or femoral bruits. Chest: symmetric with normal excursions and  percussion. Breasts: Symmetric, without lumps, nipple discharge, retractions, or fibrocystic changes.  Abdomen: Flat, soft with bowel sounds active. Nontender, no guarding, rebound, hernias, masses, or organomegaly.  Lymphatics: Non tender without lymphadenopathy.  Genitourinary:  Musculoskeletal: Full ROM all peripheral extremities, joint stability, 5/5 strength, and normal gait. Skin: Warm and dry without rashes, lesions, cyanosis, clubbing or  ecchymosis.  Neuro: Cranial nerves intact, reflexes equal bilaterally. Normal muscle tone, no cerebellar symptoms. Sensation intact.  Pysch: Alert and oriented X 3, normal affect, Insight and Judgment appropriate.   Assessment and Plan  1. Annual Preventative Screening Examination            Patient was counseled in prudent diet to achieve/maintain BMI less than 25 for weight control, BP monitoring, regular exercise and medications. Discussed med's effects and SE's. Screening labs and tests as requested with regular follow-up as recommended. Over 40 minutes of exam, counseling, chart review and high complex critical decision making was performed.   Kirtland Bouchard, MD

## 2020-02-05 NOTE — Patient Instructions (Signed)

## 2020-02-06 ENCOUNTER — Ambulatory Visit (INDEPENDENT_AMBULATORY_CARE_PROVIDER_SITE_OTHER): Payer: Medicare HMO | Admitting: Internal Medicine

## 2020-02-06 ENCOUNTER — Other Ambulatory Visit: Payer: Self-pay

## 2020-02-06 VITALS — BP 140/72 | HR 57 | Temp 96.9°F | Resp 16 | Ht 64.0 in | Wt 140.0 lb

## 2020-02-06 DIAGNOSIS — I700 Atherosclerosis of aorta: Secondary | ICD-10-CM

## 2020-02-06 DIAGNOSIS — R7309 Other abnormal glucose: Secondary | ICD-10-CM

## 2020-02-06 DIAGNOSIS — E782 Mixed hyperlipidemia: Secondary | ICD-10-CM

## 2020-02-06 DIAGNOSIS — Z0001 Encounter for general adult medical examination with abnormal findings: Secondary | ICD-10-CM

## 2020-02-06 DIAGNOSIS — Z0000 Encounter for general adult medical examination without abnormal findings: Secondary | ICD-10-CM

## 2020-02-06 DIAGNOSIS — Z79899 Other long term (current) drug therapy: Secondary | ICD-10-CM

## 2020-02-06 DIAGNOSIS — Z1212 Encounter for screening for malignant neoplasm of rectum: Secondary | ICD-10-CM

## 2020-02-06 DIAGNOSIS — Z136 Encounter for screening for cardiovascular disorders: Secondary | ICD-10-CM

## 2020-02-06 DIAGNOSIS — R0989 Other specified symptoms and signs involving the circulatory and respiratory systems: Secondary | ICD-10-CM | POA: Diagnosis not present

## 2020-02-06 DIAGNOSIS — Z8249 Family history of ischemic heart disease and other diseases of the circulatory system: Secondary | ICD-10-CM | POA: Diagnosis not present

## 2020-02-06 DIAGNOSIS — E559 Vitamin D deficiency, unspecified: Secondary | ICD-10-CM | POA: Diagnosis not present

## 2020-02-06 DIAGNOSIS — E039 Hypothyroidism, unspecified: Secondary | ICD-10-CM | POA: Diagnosis not present

## 2020-02-06 DIAGNOSIS — M353 Polymyalgia rheumatica: Secondary | ICD-10-CM

## 2020-02-06 DIAGNOSIS — Z1211 Encounter for screening for malignant neoplasm of colon: Secondary | ICD-10-CM

## 2020-02-07 LAB — COMPLETE METABOLIC PANEL WITH GFR
AG Ratio: 2.1 (calc) (ref 1.0–2.5)
ALT: 11 U/L (ref 6–29)
AST: 16 U/L (ref 10–35)
Albumin: 4.1 g/dL (ref 3.6–5.1)
Alkaline phosphatase (APISO): 72 U/L (ref 37–153)
BUN/Creatinine Ratio: 20 (calc) (ref 6–22)
BUN: 18 mg/dL (ref 7–25)
CO2: 23 mmol/L (ref 20–32)
Calcium: 10.1 mg/dL (ref 8.6–10.4)
Chloride: 107 mmol/L (ref 98–110)
Creat: 0.91 mg/dL — ABNORMAL HIGH (ref 0.60–0.88)
GFR, Est African American: 63 mL/min/{1.73_m2} (ref >=60)
GFR, Est Non African American: 55 mL/min/{1.73_m2} — ABNORMAL LOW (ref >=60)
Globulin: 2 g/dL (calc) (ref 1.9–3.7)
Glucose, Bld: 93 mg/dL (ref 65–99)
Potassium: 4.2 mmol/L (ref 3.5–5.3)
Sodium: 142 mmol/L (ref 135–146)
Total Bilirubin: 0.9 mg/dL (ref 0.2–1.2)
Total Protein: 6.1 g/dL (ref 6.1–8.1)

## 2020-02-07 LAB — HEMOGLOBIN A1C
Hgb A1c MFr Bld: 5.1 % of total Hgb (ref <5.7)
Mean Plasma Glucose: 100 mg/dL
eAG (mmol/L): 5.5 mmol/L

## 2020-02-07 LAB — URINALYSIS, ROUTINE W REFLEX MICROSCOPIC
Bilirubin Urine: NEGATIVE
Glucose, UA: NEGATIVE
Hgb urine dipstick: NEGATIVE
Ketones, ur: NEGATIVE
Leukocytes,Ua: NEGATIVE
Nitrite: NEGATIVE
Protein, ur: NEGATIVE
Specific Gravity, Urine: 1.014 (ref 1.001–1.03)
pH: <=5 (ref 5.0–8.0)

## 2020-02-07 LAB — CBC WITH DIFFERENTIAL/PLATELET
Absolute Monocytes: 561 cells/uL (ref 200–950)
Basophils Absolute: 40 cells/uL (ref 0–200)
Basophils Relative: 0.6 %
Eosinophils Absolute: 152 cells/uL (ref 15–500)
Eosinophils Relative: 2.3 %
HCT: 39.5 % (ref 35.0–45.0)
Hemoglobin: 13.2 g/dL (ref 11.7–15.5)
Lymphs Abs: 1874 cells/uL (ref 850–3900)
MCH: 29.8 pg (ref 27.0–33.0)
MCHC: 33.4 g/dL (ref 32.0–36.0)
MCV: 89.2 fL (ref 80.0–100.0)
MPV: 10 fL (ref 7.5–12.5)
Monocytes Relative: 8.5 %
Neutro Abs: 3973 cells/uL (ref 1500–7800)
Neutrophils Relative %: 60.2 %
Platelets: 257 10*3/uL (ref 140–400)
RBC: 4.43 10*6/uL (ref 3.80–5.10)
RDW: 12.2 % (ref 11.0–15.0)
Total Lymphocyte: 28.4 %
WBC: 6.6 10*3/uL (ref 3.8–10.8)

## 2020-02-07 LAB — MAGNESIUM: Magnesium: 1.9 mg/dL (ref 1.5–2.5)

## 2020-02-07 LAB — VITAMIN D 25 HYDROXY (VIT D DEFICIENCY, FRACTURES): Vit D, 25-Hydroxy: 102 ng/mL — ABNORMAL HIGH (ref 30–100)

## 2020-02-07 LAB — INSULIN, RANDOM: Insulin: 13.9 u[IU]/mL

## 2020-02-07 LAB — LIPID PANEL
Cholesterol: 172 mg/dL (ref <200)
HDL: 72 mg/dL (ref >=50)
LDL Cholesterol (Calc): 77 mg/dL (calc)
Non-HDL Cholesterol (Calc): 100 mg/dL (calc) (ref <130)
Total CHOL/HDL Ratio: 2.4 (calc) (ref <5.0)
Triglycerides: 133 mg/dL (ref <150)

## 2020-02-07 LAB — MICROALBUMIN / CREATININE URINE RATIO
Creatinine, Urine: 110 mg/dL (ref 20–275)
Microalb Creat Ratio: 3 mcg/mg creat (ref <30)
Microalb, Ur: 0.3 mg/dL

## 2020-02-07 LAB — TSH: TSH: 2.75 mIU/L (ref 0.40–4.50)

## 2020-02-07 NOTE — Progress Notes (Signed)
========================================================== ==========================================================  -    Kidney Functions - Still Stage 3a & Stable ==========================================================  -  Total Chol = 172 and LDL Chol= 77 - Both  Excellent   - Very low risk for Heart Attack  / Stroke ==========================================================  - A1c - Normal - No Diabetes  - Great  ==========================================================  -  Vitamin D = 102 - Excellent  ==========================================================  -  All Else - CBC - Kidneys - U/A - Electrolytes - Liver - Magnesium & Thyroid    - all  Normal / OK ==========================================================   - Keep up the [place] Work  !  ==========================================================

## 2020-02-12 DIAGNOSIS — S81802S Unspecified open wound, left lower leg, sequela: Secondary | ICD-10-CM | POA: Diagnosis not present

## 2020-02-14 DIAGNOSIS — D3131 Benign neoplasm of right choroid: Secondary | ICD-10-CM | POA: Diagnosis not present

## 2020-02-14 DIAGNOSIS — H0102B Squamous blepharitis left eye, upper and lower eyelids: Secondary | ICD-10-CM | POA: Diagnosis not present

## 2020-02-14 DIAGNOSIS — Z961 Presence of intraocular lens: Secondary | ICD-10-CM | POA: Diagnosis not present

## 2020-02-14 DIAGNOSIS — H0102A Squamous blepharitis right eye, upper and lower eyelids: Secondary | ICD-10-CM | POA: Diagnosis not present

## 2020-02-14 DIAGNOSIS — H43393 Other vitreous opacities, bilateral: Secondary | ICD-10-CM | POA: Diagnosis not present

## 2020-02-14 DIAGNOSIS — H26492 Other secondary cataract, left eye: Secondary | ICD-10-CM | POA: Diagnosis not present

## 2020-02-14 DIAGNOSIS — D3132 Benign neoplasm of left choroid: Secondary | ICD-10-CM | POA: Diagnosis not present

## 2020-03-14 DIAGNOSIS — L98491 Non-pressure chronic ulcer of skin of other sites limited to breakdown of skin: Secondary | ICD-10-CM | POA: Diagnosis not present

## 2020-03-14 DIAGNOSIS — I739 Peripheral vascular disease, unspecified: Secondary | ICD-10-CM | POA: Diagnosis not present

## 2020-03-19 DIAGNOSIS — I739 Peripheral vascular disease, unspecified: Secondary | ICD-10-CM | POA: Diagnosis not present

## 2020-03-26 ENCOUNTER — Other Ambulatory Visit: Payer: Self-pay

## 2020-03-26 DIAGNOSIS — Z1211 Encounter for screening for malignant neoplasm of colon: Secondary | ICD-10-CM

## 2020-03-26 DIAGNOSIS — R609 Edema, unspecified: Secondary | ICD-10-CM | POA: Diagnosis not present

## 2020-03-26 LAB — POC HEMOCCULT BLD/STL (HOME/3-CARD/SCREEN)
Card #2 Fecal Occult Blod, POC: NEGATIVE
Card #3 Fecal Occult Blood, POC: NEGATIVE
Fecal Occult Blood, POC: NEGATIVE

## 2020-03-27 DIAGNOSIS — Z1212 Encounter for screening for malignant neoplasm of rectum: Secondary | ICD-10-CM | POA: Diagnosis not present

## 2020-03-27 DIAGNOSIS — Z1211 Encounter for screening for malignant neoplasm of colon: Secondary | ICD-10-CM | POA: Diagnosis not present

## 2020-04-01 DIAGNOSIS — M79672 Pain in left foot: Secondary | ICD-10-CM | POA: Diagnosis not present

## 2020-04-01 DIAGNOSIS — M79662 Pain in left lower leg: Secondary | ICD-10-CM | POA: Diagnosis not present

## 2020-04-02 ENCOUNTER — Telehealth: Payer: Self-pay | Admitting: *Deleted

## 2020-04-02 NOTE — Telephone Encounter (Signed)
Returned a call to the patient regarding her OV to podiatrist this morning. Per patient doctor was concerned about her left ankle and RX'd Ceftin 500 mg 4 times a day. The patient states he said she had cellulitis and should call PCP. Per Dr Melford Aase, the patient should take the Ceftin and call if no improvement after a few days. Patient agreed.

## 2020-04-03 ENCOUNTER — Other Ambulatory Visit: Payer: Self-pay

## 2020-04-03 ENCOUNTER — Ambulatory Visit (INDEPENDENT_AMBULATORY_CARE_PROVIDER_SITE_OTHER): Payer: Medicare HMO | Admitting: Adult Health

## 2020-04-03 ENCOUNTER — Encounter: Payer: Self-pay | Admitting: Adult Health

## 2020-04-03 VITALS — BP 146/72 | HR 59 | Temp 97.2°F | Wt 138.0 lb

## 2020-04-03 DIAGNOSIS — M7989 Other specified soft tissue disorders: Secondary | ICD-10-CM

## 2020-04-03 DIAGNOSIS — M79662 Pain in left lower leg: Secondary | ICD-10-CM

## 2020-04-03 DIAGNOSIS — L03116 Cellulitis of left lower limb: Secondary | ICD-10-CM | POA: Insufficient documentation

## 2020-04-03 MED ORDER — TRAMADOL HCL 50 MG PO TABS: ORAL_TABLET | ORAL | 0 refills | Status: DC

## 2020-04-03 MED ORDER — DOXYCYCLINE HYCLATE 100 MG PO CAPS: ORAL_CAPSULE | ORAL | 0 refills | Status: DC

## 2020-04-03 NOTE — Progress Notes (Signed)
Assessment and Plan:  Alexandria Davies was seen today for cellulitis.  Diagnoses and all orders for this visit:  Cellulitis of left leg No improvement with keflex; will switch to doxycycline with close follow up in 36 hours Having some chills; appears stable at this time but given strict ER precautions if fever, rigors, confusion, syncope; discussed risk of sepsis -     doxycycline (VIBRAMYCIN) 100 MG capsule; Take 1 capsule 2 x/day with food for 10 days.  Pain and swelling of left lower leg ? Swelling preceded cellulitis, will r/o DVT Dr. Melford Aase in agreement; per recommendation given xarelto 10 mg samples, start BID until DVT r/o -     traMADol (ULTRAM) 50 MG tablet; Take 1 tab every 4-6 hpurs as needed for severe pain. Use tylenol for mild to moderate pain. -     VAS [place] LOWER EXTREMITY VENOUS (DVT)  Further disposition pending results of labs. Discussed med's effects and SE's.   Over 30 minutes of exam, counseling, chart review, and critical decision making was performed.   Future Appointments  Date Time Provider [place]  05/07/2020 11:30 AM Liane Comber, NP [facility] None  06/04/2020 10:45 AM Bo Merino, MD [facility] None  08/13/2020 11:30 AM Unk Pinto, MD [facility] None  02/18/2021 11:00 AM Unk Pinto, MD [facility] None    ------------------------------------------------------------------------------------------------------------------   HPI BP (!) 146/72   Pulse (!) 59   Temp (!) 97.2 F (36.2 C)   Wt 138 lb (62.6 kg)   SpO2 98%   BMI 23.69 kg/m   87 y.o.female presents for evaluation of left lower extremity with rash and swelling.   She reports backed up into a bench in her garden in Nov 2021, was seen by UC and got stitches. Has had some swelling since then, but progressively worse in the last month, developed rash in the last 2 weeks, heat and generalized tenderness, swelling is pitting and progressive.   Saw podiatry on Monday, was very  concerned about possible cellulitis, initiated on cephalexin, taking 500 mg QID, today is day 3 but notes has gotten worse with no improvement since initiation. She denies fever, but endorses chills last night and today.   Has tried tylenol for pain without benefit;   Denies hx of clots or clotting disorder; denies palpiations, dyspnea, cough or dizziness; she is on bASA  Past Medical History:  Diagnosis Date  . Anxiety   . Breast cancer ([place])    left  . Colon polyp 2009   BENIGN POLYPOID  . Diverticulosis of colon (without mention of hemorrhage) 2009  . DJD (degenerative joint disease)   . Family history of malignant neoplasm of gastrointestinal tract 05/06/2011  . HOH (hard of hearing)    Right ear  . Hypercholesteremia   . Hypothyroidism   . IBS (irritable bowel syndrome)   . Internal hemorrhoids without mention of complication   . Primary osteoarthritis of right knee 11/26/2015   S/p TKA  . Sinus drainage   . Vitiligo   . Wears dentures    top  . Wears glasses      Allergies  Allergen Reactions  . Ciprofloxacin   . Epinephrine Other (See Comments)    Heart raced   . Etodolac Hives  . Synephrine     Palpitations   . Flagyl [Metronidazole Hcl] Rash    Current Outpatient Medications on File Prior to Visit  Medication Sig  . aspirin (ASPIRIN 81) 81 MG EC tablet Take 1 tablet daily  . Cholecalciferol (  VITAMIN D PO) Take 5,000 Units by mouth 2 (two) times daily.  . clotrimazole-betamethasone (LOTRISONE) cream Apply 1 application topically 2 (two) times daily. (Patient taking differently: Apply 1 application topically as needed.)  . diazepam (VALIUM) 5 MG tablet Take      1/2-1 tablet       2 - 3 x /day       ONLY      if needed for Anxiety Attack &  limit to 5 days /week to avoid addiction  . levothyroxine (SYNTHROID) 150 MCG tablet Take 1/2-1 tablet daily as directed on an empty stomach with only water for 30 minutes & no Antacid meds, Calcium or Magnesium for 4 hours &  avoid Biotin  . Probiotic Product (PROBIOTIC PO) Take 1 tablet by mouth daily.  . simvastatin (ZOCOR) 20 MG tablet Take    1 tablet    at Bedtime      for Cholesterol   No current facility-administered medications on file prior to visit.    ROS: all negative except above.   Physical Exam:  BP (!) 146/72   Pulse (!) 59   Temp (!) 97.2 F (36.2 C)   Wt 138 lb (62.6 kg)   SpO2 98%   BMI 23.69 kg/m   General Appearance: Well nourished, well dressed elder female in no apparent distress. Eyes: PERRLA, conjunctiva no swelling or erythema ENT/Mouth: mask in place; mildly HOH Neck: Supple Respiratory: Respiratory effort normal, BS equal bilaterally without rales, rhonchi, wheezing or stridor.  Cardio: RRR with no MRGs. 2+ pitting edema LLQ calf, ankle, pedal, tenderness limit deep palpation for pulse; warm foot; neg homan's Abdomen: Soft, + BS.  Non tender Lymphatics: Non tender without lymphadenopathy.  Musculoskeletal: Full ROM ankle and knee;  Skin: Warm, dry; generalized erythema, warmth, flaking skin of left calf, ankle Neuro: Normal muscle tone, distal Sensation intact.  Psych: Awake and oriented X 3, normal affect, Insight and Judgment appropriate.     Alexandria Ribas, NP 5:34 PM [facility]

## 2020-04-03 NOTE — Patient Instructions (Addendum)
Please STOP cephalexin (kelfex)  Start doxycyline 100 mg twice daily with food instead  Can use tramadol 50 mg for severe pain - may cause sedation  Tylenol 1-2 tabs as needed for mild pain  Start xarelto 10 mg twice daily until we can rule out a clot in your let  Please go to the ER if fevers, rigors, getting dizzy or confused    Cellulitis, Adult  Cellulitis is a skin infection. The infected area is often warm, red, swollen, and sore. It occurs most often in the arms and lower legs. It is very important to get treated for this condition. What are the causes? This condition is caused by bacteria. The bacteria enter through a break in the skin, such as a cut, burn, insect bite, open sore, or crack. What increases the risk? This condition is more likely to occur in people who:  Have a weak body defense system (immune system).  Have open cuts, burns, bites, or scrapes on the skin.  Are older than 85 years of age.  Have a blood sugar problem (diabetes).  Have a long-lasting (chronic) liver disease (cirrhosis) or kidney disease.  Are very overweight (obese).  Have a skin problem, such as: ? Itchy rash (eczema). ? Slow movement of blood in the veins (venous stasis). ? Fluid buildup below the skin (edema).  Have been treated with high-energy rays (radiation).  Use IV drugs. What are the signs or symptoms? Symptoms of this condition include:  Skin that is: ? Red. ? Streaking. ? Spotting. ? Swollen. ? Sore or painful when you touch it. ? Warm.  A fever.  Chills.  Blisters. How is this diagnosed? This condition is diagnosed based on:  Medical history.  Physical exam.  Blood tests.  Imaging tests. How is this treated? Treatment for this condition may include:  Medicines to treat infections or allergies.  Home care, such as: ? Rest. ? Placing cold or warm cloths (compresses) on the skin.  Hospital care, if the condition is very bad. Follow these  instructions at home: Medicines  Take over-the-counter and prescription medicines only as told by your doctor.  If you were prescribed an antibiotic medicine, take it as told by your doctor. Do not stop taking it even if you start to feel better. General instructions  Drink enough fluid to keep your pee (urine) pale yellow.  Do not touch or rub the infected area.  Raise (elevate) the infected area above the level of your heart while you are sitting or lying down.  Place cold or warm cloths on the area as told by your doctor.  Keep all follow-up visits as told by your doctor. This is important.   Contact a doctor if:  You have a fever.  You do not start to get better after 1-2 days of treatment.  Your bone or joint under the infected area starts to hurt after the skin has healed.  Your infection comes back. This can happen in the same area or another area.  You have a swollen bump in the area.  You have new symptoms.  You feel ill and have muscle aches and pains. Get help right away if:  Your symptoms get worse.  You feel very sleepy.  You throw up (vomit) or have watery poop (diarrhea) for a long time.  You see red streaks coming from the area.  Your red area gets larger.  Your red area turns dark in color. These symptoms may represent a serious problem  that is an emergency. Do not wait to see if the symptoms will go away. Get medical help right away. Call your local emergency services (911 in the [place] Do not drive yourself to the hospital. Summary  Cellulitis is a skin infection. The area is often warm, red, swollen, and sore.  This condition is treated with medicines, rest, and cold and warm cloths.  Take all medicines only as told by your doctor.  Tell your doctor if symptoms do not start to get better after 1-2 days of treatment. This information is not intended to replace advice given to you by your health care provider. Make sure you discuss any  questions you have with your health care provider. Document Revised: 07/08/2017 Document Reviewed: 07/08/2017 Elsevier Patient Education  [employer].

## 2020-04-04 ENCOUNTER — Ambulatory Visit: Payer: Medicare HMO | Admitting: Adult Health

## 2020-04-04 ENCOUNTER — Ambulatory Visit (HOSPITAL_COMMUNITY)
Admission: RE | Admit: 2020-04-04 | Discharge: 2020-04-04 | Disposition: A | Discharge status: Home / Self Care | Payer: Medicare HMO | Source: Ambulatory Visit | Attending: Adult Health | Admitting: Adult Health

## 2020-04-04 ENCOUNTER — Telehealth: Payer: Self-pay | Admitting: Adult Health

## 2020-04-04 DIAGNOSIS — M7989 Other specified soft tissue disorders: Secondary | ICD-10-CM

## 2020-04-04 DIAGNOSIS — M79662 Pain in left lower leg: Secondary | ICD-10-CM | POA: Diagnosis not present

## 2020-04-04 DIAGNOSIS — E039 Hypothyroidism, unspecified: Secondary | ICD-10-CM

## 2020-04-04 MED ORDER — LEVOTHYROXINE SODIUM 150 MCG PO TABS
ORAL_TABLET | ORAL | 1 refills | Status: DC
Start: 2020-04-04 — End: 2021-02-20

## 2020-04-04 NOTE — Telephone Encounter (Signed)
Patient requests renewal for Levothyroxine 158mcg be sent to [facility].

## 2020-04-04 NOTE — Addendum Note (Signed)
Addended by: Chancy Hurter on: 04/04/2020 12:09 PM   Modules accepted: Orders

## 2020-04-05 ENCOUNTER — Other Ambulatory Visit: Payer: Self-pay

## 2020-04-05 ENCOUNTER — Ambulatory Visit (INDEPENDENT_AMBULATORY_CARE_PROVIDER_SITE_OTHER): Payer: Medicare HMO | Admitting: Adult Health

## 2020-04-05 ENCOUNTER — Encounter: Payer: Self-pay | Admitting: Adult Health

## 2020-04-05 VITALS — BP 122/66 | HR 52 | Temp 97.5°F | Wt 138.0 lb

## 2020-04-05 DIAGNOSIS — I872 Venous insufficiency (chronic) (peripheral): Secondary | ICD-10-CM | POA: Diagnosis not present

## 2020-04-05 DIAGNOSIS — L03116 Cellulitis of left lower limb: Secondary | ICD-10-CM | POA: Diagnosis not present

## 2020-04-05 MED ORDER — FUROSEMIDE 20 MG PO TABS: ORAL_TABLET | ORAL | 0 refills | Status: DC

## 2020-04-05 NOTE — Patient Instructions (Signed)
Please take 1 tab furosemide (lasix) water pill in the morning for 3-5 days for swelling. This medication will cause your blood pressure to be lower. Hold or stop medication if blood pressures are getting too low with dizziness  Please apply either ACE wrap (start at bottom and wrap up) OR compression hose stocking first thing in the morning. You can buy this for < $10 at a [facility]   Please take doxycycline twice daily, about 12 hours apart - with breakfast and dinner. Very rare to cause dizziness.      Peripheral Edema  Peripheral edema is swelling that is caused by a buildup of fluid. Peripheral edema most often affects the lower legs, ankles, and feet. It can also develop in the arms, hands, and face. The area of the body that has peripheral edema will look swollen. It may also feel heavy or warm. Your clothes may start to feel tight. Pressing on the area may make a temporary dent in your skin. You may not be able to move your swollen arm or leg as much as usual. There are many causes of peripheral edema. It can happen because of a complication of other conditions such as congestive heart failure, kidney disease, or a problem with your blood circulation. It also can be a side effect of certain medicines or because of an infection. It often happens to women during pregnancy. Sometimes, the cause is not known. Follow these instructions at home: Managing pain, stiffness, and swelling  Raise (elevate) your legs while you are sitting or lying down.  Move around often to prevent stiffness and to lessen swelling.  Do not sit or stand for long periods of time.  Wear support stockings as told by your health care provider.   Medicines  Take over-the-counter and prescription medicines only as told by your health care provider.  Your health care provider may prescribe medicine to help your body get rid of excess water (diuretic). General instructions  Pay attention to any changes in your  symptoms.  Follow instructions from your health care provider about limiting salt (sodium) in your diet. Sometimes, eating less salt may reduce swelling.  Moisturize skin daily to help prevent skin from cracking and draining.  Keep all follow-up visits as told by your health care provider. This is important. Contact a health care provider if you have:  A fever.  Edema that starts suddenly or is getting worse, especially if you are pregnant or have a medical condition.  Swelling in only one leg.  Increased swelling, redness, or pain in one or both of your legs.  Drainage or sores at the area where you have edema. Get help right away if you:  Develop shortness of breath, especially when you are lying down.  Have pain in your chest or abdomen.  Feel weak.  Feel faint. Summary  Peripheral edema is swelling that is caused by a buildup of fluid. Peripheral edema most often affects the lower legs, ankles, and feet.  Move around often to prevent stiffness and to lessen swelling. Do not sit or stand for long periods of time.  Pay attention to any changes in your symptoms.  Contact a health care provider if you have edema that starts suddenly or is getting worse, especially if you are pregnant or have a medical condition.  Get help right away if you develop shortness of breath, especially when lying down. This information is not intended to replace advice given to you by your health  care provider. Make sure you discuss any questions you have with your health care provider. Document Revised: 11/10/2017 Document Reviewed: 11/10/2017 Elsevier Patient Education  2021 [employer].

## 2020-04-05 NOTE — Progress Notes (Signed)
Assessment and Plan:  Mikhala was seen today for cellulitis.  Diagnoses and all orders for this visit:  Cellulitis of left leg Sig improvement after 36 hours on doxycycline;  Continue BID until resolution Close follow up in another 5 days  Given strict ER precautions if fever, rigors, confusion, syncope; discussed risk of sepsis -     doxycycline (VIBRAMYCIN) 100 MG capsule; Take 1 capsule 2 x/day with food for 10 days.  Venous stasis dermatitis left lower leg Persistent following fall and laceration requiring sutures in 01/2020 at another office per their communication Discussed to prevent recurrent cellulitis and dermatitis need to manage edema Discucssed strategies; provided ACE wrap here at office, demonstrated how to apply at home Demonstrated affordable compression hose options, encouraged her to get this and start after initial edema is improved to prevent recurrence Continue to elevate extremity as much as possible Will add short term furosemide 20 mg for 3-5 days after discussion with Dr. Melford Aase; patient in agreement; monitor for hypotension and stop if any issues Follow up in 5 days and plan to check labs then  Further disposition pending results of labs. Discussed med's effects and SE's.   Over 30 minutes of exam, counseling, chart review, and critical decision making was performed.   Future Appointments  Date Time Provider [place]  05/07/2020 11:30 AM Liane Comber, NP [facility] None  06/04/2020 10:45 AM Bo Merino, MD [facility] None  08/13/2020 11:30 AM Unk Pinto, MD [facility] None  02/18/2021 11:00 AM Unk Pinto, MD [facility] None    ------------------------------------------------------------------------------------------------------------------   HPI BP 122/66   Pulse (!) 52   Temp (!) 97.5 F (36.4 C)   Wt 138 lb (62.6 kg)   SpO2 95%   BMI 23.69 kg/m   87 y.o.female presents for 2 day follow up on left lower extremity cellulitis  and edema. She was seen 04/03/2020 with cellulitis not improving on keflex that was initiated by podiatry; was switched to doxycycline. Edema, rash have been intermittently ongoing since Nov 2021, was seen by another practice after she tripped in garden and got stiches to lateral lower leg. Received handwritten communication from Dr. Tretha Sciara office, appears sx have improved when edema has been managed, but she has been non-compliant with compression therapy.   Since last visit, not rash and edema is persistent but heat and tenderness are resolved with doxycycline. She admits elevating leg when sitting, but "I don't sit much taking care of my husband." Not wearing compression, "I don't want to spend $30 on a sock."   She did have DVT study yesterday that was fortunately negative.   Past Medical History:  Diagnosis Date  . Anxiety   . Breast cancer ([place])    left  . Colon polyp 2009   BENIGN POLYPOID  . Diverticulosis of colon (without mention of hemorrhage) 2009  . DJD (degenerative joint disease)   . Family history of malignant neoplasm of gastrointestinal tract 05/06/2011  . HOH (hard of hearing)    Right ear  . Hypercholesteremia   . Hypothyroidism   . IBS (irritable bowel syndrome)   . Internal hemorrhoids without mention of complication   . Primary osteoarthritis of right knee 11/26/2015   S/p TKA  . Sinus drainage   . Vitiligo   . Wears dentures    top  . Wears glasses      Allergies  Allergen Reactions  . Ciprofloxacin   . Epinephrine Other (See Comments)    Heart raced   . Etodolac  Hives  . Synephrine     Palpitations   . Flagyl [Metronidazole Hcl] Rash    Current Outpatient Medications on File Prior to Visit  Medication Sig  . aspirin (ASPIRIN 81) 81 MG EC tablet Take 1 tablet daily  . Cholecalciferol (VITAMIN D PO) Take 5,000 Units by mouth 2 (two) times daily.  . clotrimazole-betamethasone (LOTRISONE) cream Apply 1 application topically 2 (two) times daily. (Patient  taking differently: Apply 1 application topically as needed.)  . diazepam (VALIUM) 5 MG tablet Take      1/2-1 tablet       2 - 3 x /day       ONLY      if needed for Anxiety Attack &  limit to 5 days /week to avoid addiction  . doxycycline (VIBRAMYCIN) 100 MG capsule Take 1 capsule 2 x/day with food for 10 days.  Marland Kitchen levothyroxine (SYNTHROID) 150 MCG tablet Take 1/2-1 tablet daily as directed on an empty stomach with only water for 30 minutes & no Antacid meds, Calcium or Magnesium for 4 hours & avoid Biotin  . Probiotic Product (PROBIOTIC PO) Take 1 tablet by mouth daily.  . simvastatin (ZOCOR) 20 MG tablet Take    1 tablet    at Bedtime      for Cholesterol  . traMADol (ULTRAM) 50 MG tablet Take 1 tab every 4-6 hpurs as needed for severe pain. Use tylenol for mild to moderate pain.   No current facility-administered medications on file prior to visit.    ROS: all negative except above.   Physical Exam:  BP 122/66   Pulse (!) 52   Temp (!) 97.5 F (36.4 C)   Wt 138 lb (62.6 kg)   SpO2 95%   BMI 23.69 kg/m   General Appearance: Well nourished, well dressed elder female in no apparent distress. Eyes: PERRLA, conjunctiva no swelling or erythema ENT/Mouth: mask in place; mildly HOH Neck: Supple Respiratory: Respiratory effort normal, BS equal bilaterally without rales, rhonchi, wheezing or stridor.  Cardio: RRR with no MRGs. 2+ pitting edema LLQ calf, ankle, pedal, no longer tender Abdomen: Soft, + BS.  Non tender Lymphatics: Non tender without lymphadenopathy.  Musculoskeletal: Full ROM ankle and knee;  Skin: Warm, dry; generalized erythematous rash left calf to ankle, no longer warm to touch. No distinct lesions, no discharge Neuro: Normal muscle tone, distal Sensation intact.  Psych: Awake and oriented X 3, normal affect, Insight and Judgment appropriate.     Izora Ribas, NP 12:03 PM [facility]

## 2020-04-08 DIAGNOSIS — I872 Venous insufficiency (chronic) (peripheral): Secondary | ICD-10-CM | POA: Insufficient documentation

## 2020-04-08 NOTE — Progress Notes (Signed)
Assessment and Plan:  Alexandria Davies was seen today for cellulitis.  Diagnoses and all orders for this visit:  Cellulitis of left leg Sig improvement on doxycycline; 5 days remaining Continue BID until resolution Close follow up in another 8-10 to ensure not recurrent after completion of abx Follow up sooner if worsening sx -     doxycycline (VIBRAMYCIN) 100 MG capsule; Take 1 capsule 2 x/day with food for 10 days.  Venous stasis/edema of left lower leg Persistent following fall and laceration requiring sutures in 01/2020 at another office per their communication Discussed to prevent recurrent cellulitis and dermatitis need to manage edema Much improved with elevation and short term lasix;  Stop lasix Continue to elevate extremity as much as possible Provided compression sock; assisted to apply this successfully in office today Will follow up in 8-10 days   Further disposition pending results of labs. Discussed med's effects and SE's.   Over 15 minutes of exam, counseling, chart review, and critical decision making was performed.   Future Appointments  Date Time Provider [place]  05/07/2020 11:30 AM Liane Comber, NP [facility] None  06/04/2020 10:45 AM Bo Merino, MD [facility] None  08/13/2020 11:30 AM Unk Pinto, MD [facility] None  02/18/2021 11:00 AM Unk Pinto, MD [facility] None    ------------------------------------------------------------------------------------------------------------------   HPI BP 136/64   Pulse 70   Temp (!) 97.3 F (36.3 C)   Wt 135 lb (61.2 kg)   SpO2 97%   BMI 23.17 kg/m   87 y.o.female presents for 2 day follow up on left lower extremity cellulitis and edema. She was seen 04/03/2020 with cellulitis not improving on keflex that was initiated by podiatry; was switched to doxycycline. Edema, rash have been intermittently ongoing since Nov 2021 following an injury, was seen by another practice after she tripped in garden and got  stiches to lateral lower leg. Received handwritten communication from Dr. Tretha Sciara office, appears sx have improved when edema has been managed, but she has been non-compliant with compression therapy with recurrent rash/infectious sx. Last visit lasix 20 mg daily x 3 days was added. She reports has been spending most of the day with her leg elevated.   Rash/cellulitis is much improved; she reports 5 days left on doxycycline.   She did have DVT study 04/04/2020 that was fortunately negative.   Past Medical History:  Diagnosis Date  . Anxiety   . Breast cancer ([place])    left  . Colon polyp 2009   BENIGN POLYPOID  . Diverticulosis of colon (without mention of hemorrhage) 2009  . DJD (degenerative joint disease)   . Family history of malignant neoplasm of gastrointestinal tract 05/06/2011  . HOH (hard of hearing)    Right ear  . Hypercholesteremia   . Hypothyroidism   . IBS (irritable bowel syndrome)   . Internal hemorrhoids without mention of complication   . Primary osteoarthritis of right knee 11/26/2015   S/p TKA  . Sinus drainage   . Vitiligo   . Wears dentures    top  . Wears glasses      Allergies  Allergen Reactions  . Ciprofloxacin   . Epinephrine Other (See Comments)    Heart raced   . Etodolac Hives  . Synephrine     Palpitations   . Flagyl [Metronidazole Hcl] Rash    Current Outpatient Medications on File Prior to Visit  Medication Sig  . aspirin (ASPIRIN 81) 81 MG EC tablet Take 1 tablet daily  . Cholecalciferol (VITAMIN D  PO) Take 5,000 Units by mouth 2 (two) times daily.  . clotrimazole-betamethasone (LOTRISONE) cream Apply 1 application topically 2 (two) times daily. (Patient taking differently: Apply 1 application topically as needed.)  . diazepam (VALIUM) 5 MG tablet Take      1/2-1 tablet       2 - 3 x /day       ONLY      if needed for Anxiety Attack &  limit to 5 days /week to avoid addiction  . doxycycline (VIBRAMYCIN) 100 MG capsule Take 1 capsule 2 x/day  with food for 10 days.  . furosemide (LASIX) 20 MG tablet Take 1 tab once daily in the morning for 3-5 days for leg swelling. Hold medication if any low blood pressures.  Marland Kitchen levothyroxine (SYNTHROID) 150 MCG tablet Take 1/2-1 tablet daily as directed on an empty stomach with only water for 30 minutes & no Antacid meds, Calcium or Magnesium for 4 hours & avoid Biotin  . Probiotic Product (PROBIOTIC PO) Take 1 tablet by mouth daily.  . simvastatin (ZOCOR) 20 MG tablet Take    1 tablet    at Bedtime      for Cholesterol   No current facility-administered medications on file prior to visit.    ROS: all negative except above.   Physical Exam:  BP 136/64   Pulse 70   Temp (!) 97.3 F (36.3 C)   Wt 135 lb (61.2 kg)   SpO2 97%   BMI 23.17 kg/m   General Appearance: Well nourished, well dressed elder female in no apparent distress. Eyes: PERRLA, conjunctiva no swelling or erythema ENT/Mouth: mask in place; mildly HOH Neck: Supple Respiratory: Respiratory effort normal, BS equal bilaterally without rales, rhonchi, wheezing or stridor.  Cardio: RRR with no MRGs. scant edema LLQ calf, ankle, pedal, no longer tender Abdomen: Soft, + BS.  Non tender Lymphatics: Non tender without lymphadenopathy.  Musculoskeletal: Full ROM ankle and knee;  Skin: Warm, dry; mild generalized erythema left calf to ankle, no longer warm to touch. No distinct lesions, no discharge Neuro: Normal muscle tone, distal Sensation intact.  Psych: Awake and oriented X 3, normal affect, Insight and Judgment appropriate.     Izora Ribas, NP 8:56 AM [facility]

## 2020-04-09 ENCOUNTER — Ambulatory Visit (INDEPENDENT_AMBULATORY_CARE_PROVIDER_SITE_OTHER): Payer: Medicare HMO | Admitting: Adult Health

## 2020-04-09 ENCOUNTER — Encounter: Payer: Self-pay | Admitting: Adult Health

## 2020-04-09 ENCOUNTER — Other Ambulatory Visit: Payer: Self-pay

## 2020-04-09 VITALS — BP 136/64 | HR 70 | Temp 97.3°F | Wt 135.0 lb

## 2020-04-09 DIAGNOSIS — R0989 Other specified symptoms and signs involving the circulatory and respiratory systems: Secondary | ICD-10-CM

## 2020-04-09 DIAGNOSIS — Z79899 Other long term (current) drug therapy: Secondary | ICD-10-CM

## 2020-04-09 DIAGNOSIS — L03116 Cellulitis of left lower limb: Secondary | ICD-10-CM | POA: Diagnosis not present

## 2020-04-09 DIAGNOSIS — I872 Venous insufficiency (chronic) (peripheral): Secondary | ICD-10-CM

## 2020-04-09 NOTE — Patient Instructions (Signed)
STOP lasix - restart only if needed short term for worsening swelling  Continue doxycycline until completed  Continue to elevate leg as much as possible  Apply compression hose if you will be sitting or standing with feet down to prevent recurrent swelling and infection  Follow up in 8-10 days, or sooner if getting any worse    Nuts, nut butters are healthy way to gain weight -    High-Protein and High-Calorie Diet Eating high-protein and high-calorie foods can help you to gain weight, heal after an injury, and recover after an illness or surgery. The specific amount of daily protein and calories you need depends on:  Your body weight.  The reason this diet is recommended for you. What is my plan? Generally, a high-protein, high-calorie diet involves:  Eating 250-500 extra calories each day.  Making sure that you get enough of your daily calories from protein. Ask your health care provider how many of your calories should come from protein. Talk with a health care provider, such as a diet and nutrition specialist (dietitian), about how much protein and how many calories you need each day. Follow the diet as directed by your health care provider. What are tips for following this plan? Preparing meals  Add whole milk, half-and-half, or heavy cream to cereal, pudding, soup, or hot cocoa.  Add whole milk to instant breakfast drinks.  Add peanut butter to oatmeal or smoothies.  Add powdered milk to baked goods, smoothies, or milkshakes.  Add powdered milk, cream, or butter to mashed potatoes.  Add cheese to cooked vegetables.  Make whole-milk yogurt parfaits. Top them with granola, fruit, or nuts.  Add cottage cheese to your fruit.  Add avocado, cheese, or both to sandwiches or salads.  Add meat, poultry, or seafood to rice, pasta, casseroles, salads, and soups.  Use mayonnaise when making egg salad, chicken salad, or tuna salad.  Use peanut butter as a dip for  vegetables or as a topping for pretzels, celery, or crackers.  Add beans to casseroles, dips, and spreads.  Add pureed beans to sauces and soups.  Replace calorie-free drinks with calorie-containing drinks, such as milk and fruit juice.  Replace water with milk or heavy cream when making foods such as oatmeal, pudding, or cocoa. General instructions  Ask your health care provider if you should take a nutritional supplement.  Try to eat six small meals each day instead of three large meals.  Eat a balanced diet. In each meal, include one food that is high in protein.  Keep nutritious snacks available, such as nuts, trail mixes, dried fruit, and yogurt.  If you have kidney disease or diabetes, talk with your health care provider about how much protein is safe for you. Too much protein may put extra stress on your kidneys.  Drink your calories. Choose high-calorie drinks and have them after your meals.   What high-protein foods should I eat? Vegetables Soybeans. Peas. Grains Quinoa. Bulgur wheat. Meats and other proteins Beef, pork, and poultry. Fish and seafood. Eggs. Tofu. Textured vegetable protein (TVP). Peanut butter. Nuts and seeds. Dried beans. Protein powders. Dairy Whole milk. Whole-milk yogurt. Powdered milk. Cheese. [employer]. Eggnog. Beverages High-protein supplement drinks. Soy milk. Other foods Protein bars. The items listed above may not be a complete list of high-protein foods and beverages. Contact a dietitian for more options.   What high-calorie foods should I eat? Fruits Dried fruit. Fruit leather. Canned fruit in syrup. Fruit juice. Avocado. Vegetables Vegetables cooked  in oil or butter. Fried potatoes. Grains Pasta. Quick breads. Muffins. Pancakes. Ready-to-eat cereal. Meats and other proteins Peanut butter. Nuts and seeds. Dairy Heavy cream. Whipped cream. Cream cheese. Sour cream. Ice cream. Custard. Pudding. Beverages Meal-replacement  beverages. Nutrition shakes. Fruit juice. Sugar-sweetened soft drinks. Seasonings and condiments Salad dressing. Mayonnaise. Alfredo sauce. Fruit preserves or jelly. Honey. Syrup. Sweets and desserts Cake. Cookies. Pie. Pastries. Candy bars. Chocolate. Fats and oils Butter or margarine. Oil. Gravy. Other foods Meal-replacement bars. The items listed above may not be a complete list of high-calorie foods and beverages. Contact a dietitian for more options. Summary  A high-protein, high-calorie diet can help you gain weight or heal faster after an injury, illness, or surgery.  To increase your protein and calories, add ingredients such as whole milk, peanut butter, cheese, beans, meat, or seafood to meal items.  To get enough extra calories each day, include high-calorie foods and beverages at each meal.  Adding a high-calorie drink or shake can be an easy way to help you get enough calories each day. Talk with your healthcare provider or dietitian about the best options for you. This information is not intended to replace advice given to you by your health care provider. Make sure you discuss any questions you have with your health care provider. Document Revised: 01/29/2017 Document Reviewed: 12/29/2016 Elsevier Patient Education  2021 [employer].

## 2020-04-10 ENCOUNTER — Other Ambulatory Visit: Payer: Self-pay | Admitting: Adult Health

## 2020-04-10 LAB — BASIC METABOLIC PANEL WITH GFR
BUN/Creatinine Ratio: 22 (calc) (ref 6–22)
BUN: 20 mg/dL (ref 7–25)
CO2: 26 mmol/L (ref 20–32)
Calcium: 10.3 mg/dL (ref 8.6–10.4)
Chloride: 105 mmol/L (ref 98–110)
Creat: 0.91 mg/dL — ABNORMAL HIGH (ref 0.60–0.88)
GFR, Est African American: 63 mL/min/{1.73_m2} (ref >=60)
GFR, Est Non African American: 55 mL/min/{1.73_m2} — ABNORMAL LOW (ref >=60)
Glucose, Bld: 85 mg/dL (ref 65–99)
Potassium: 4 mmol/L (ref 3.5–5.3)
Sodium: 141 mmol/L (ref 135–146)

## 2020-04-10 LAB — MAGNESIUM: Magnesium: 2 mg/dL (ref 1.5–2.5)

## 2020-04-10 MED ORDER — FUROSEMIDE 20 MG PO TABS: ORAL_TABLET | ORAL | 0 refills | Status: DC

## 2020-04-15 NOTE — Progress Notes (Signed)
Assessment and Plan:  Alexandria Davies was seen today for cellulitis.  Diagnoses and all orders for this visit:  Cellulitis of left leg Resolved; continue edema management; follow up if any signs of recurrence  Venous stasis/edema of left lower leg Persistent following fall and laceration requiring sutures in 01/2020 at another office per their communication Discussed to prevent recurrent cellulitis and dermatitis need to manage edema Much improved with elevation and compression; lasix PRN only  Continue to elevate extremity as much as possible Provided compression sock; assisted to apply this successfully in office today  Further disposition pending results of labs. Discussed med's effects and SE's.   Over 15 minutes of exam, counseling, chart review, and critical decision making was performed.   Future Appointments  Date Time Provider [place]  05/07/2020 11:30 AM Liane Comber, NP [facility] None  06/04/2020 10:45 AM Bo Merino, MD [facility] None  08/13/2020 11:30 AM Unk Pinto, MD [facility] None  02/18/2021 11:00 AM Unk Pinto, MD [facility] None    ------------------------------------------------------------------------------------------------------------------   HPI BP 122/60   Pulse 64   Temp (!) 96.3 F (35.7 C)   Wt 132 lb (59.9 kg)   SpO2 97%   BMI 22.66 kg/m   87 y.o.female presents for  follow up on left lower extremity cellulitis and edema. She was seen 04/03/2020 with cellulitis not improving on keflex that was initiated by podiatry; was switched to doxycycline. Edema, rash have been intermittently ongoing since Nov 2021 following an injury, was seen by another practice after she tripped in garden and got stiches to lateral lower leg.   Rash has resolved; completed doxycycline 5 days ago without recurrent sx. She has been wearing compression sock and elevating to manage swelling and reports doing well.   She did have DVT study 04/04/2020 that was  fortunately negative.   Past Medical History:  Diagnosis Date  . Anxiety   . Breast cancer ([place])    left  . Colon polyp 2009   BENIGN POLYPOID  . Diverticulosis of colon (without mention of hemorrhage) 2009  . DJD (degenerative joint disease)   . Family history of malignant neoplasm of gastrointestinal tract 05/06/2011  . HOH (hard of hearing)    Right ear  . Hypercholesteremia   . Hypothyroidism   . IBS (irritable bowel syndrome)   . Internal hemorrhoids without mention of complication   . Primary osteoarthritis of right knee 11/26/2015   S/p TKA  . Sinus drainage   . Vitiligo   . Wears dentures    top  . Wears glasses      Allergies  Allergen Reactions  . Ciprofloxacin   . Epinephrine Other (See Comments)    Heart raced   . Etodolac Hives  . Synephrine     Palpitations   . Flagyl [Metronidazole Hcl] Rash    Current Outpatient Medications on File Prior to Visit  Medication Sig  . aspirin (ASPIRIN 81) 81 MG EC tablet Take 1 tablet daily  . Cholecalciferol (VITAMIN D PO) Take 5,000 Units by mouth 2 (two) times daily.  . clotrimazole-betamethasone (LOTRISONE) cream Apply 1 application topically 2 (two) times daily. (Patient taking differently: Apply 1 application topically as needed.)  . diazepam (VALIUM) 5 MG tablet Take      1/2-1 tablet       2 - 3 x /day       ONLY      if needed for Anxiety Attack &  limit to 5 days /week to avoid addiction  .  furosemide (LASIX) 20 MG tablet Take 1 tab once daily in the morning as needed for leg swelling. Hold medication if any low blood pressures.  Marland Kitchen levothyroxine (SYNTHROID) 150 MCG tablet Take 1/2-1 tablet daily as directed on an empty stomach with only water for 30 minutes & no Antacid meds, Calcium or Magnesium for 4 hours & avoid Biotin  . Probiotic Product (PROBIOTIC PO) Take 1 tablet by mouth daily.  . simvastatin (ZOCOR) 20 MG tablet Take    1 tablet    at Bedtime      for Cholesterol  . doxycycline (VIBRAMYCIN) 100 MG  capsule Take 1 capsule 2 x/day with food for 10 days.   No current facility-administered medications on file prior to visit.    ROS: all negative except above.   Physical Exam:  BP 122/60   Pulse 64   Temp (!) 96.3 F (35.7 C)   Wt 132 lb (59.9 kg)   SpO2 97%   BMI 22.66 kg/m   General Appearance: Well nourished, well dressed elder female in no apparent distress. Eyes: PERRLA, conjunctiva no swelling or erythema ENT/Mouth: mask in place; mildly HOH Neck: Supple Respiratory: Respiratory effort normal, BS equal bilaterally without rales, rhonchi, wheezing or stridor.  Cardio: RRR with no MRGs. Minimal edema LLQ calf, ankle, pedal, no longer tender Abdomen: Soft, + BS.  Non tender Lymphatics: Non tender without lymphadenopathy.  Musculoskeletal: Full ROM ankle and knee;  Skin: Warm, dry; no rash or erythema Neuro: Normal muscle tone, distal Sensation intact.  Psych: Awake and oriented X 3, normal affect, Insight and Judgment appropriate.     Izora Ribas, NP 10:25 AM [facility]

## 2020-04-17 ENCOUNTER — Other Ambulatory Visit: Payer: Self-pay

## 2020-04-17 ENCOUNTER — Encounter: Payer: Self-pay | Admitting: Adult Health

## 2020-04-17 ENCOUNTER — Ambulatory Visit (INDEPENDENT_AMBULATORY_CARE_PROVIDER_SITE_OTHER): Payer: Medicare HMO | Admitting: Adult Health

## 2020-04-17 VITALS — BP 122/60 | HR 64 | Temp 96.3°F | Wt 132.0 lb

## 2020-04-17 DIAGNOSIS — L03116 Cellulitis of left lower limb: Secondary | ICD-10-CM | POA: Diagnosis not present

## 2020-04-17 DIAGNOSIS — I878 Other specified disorders of veins: Secondary | ICD-10-CM

## 2020-04-30 ENCOUNTER — Other Ambulatory Visit: Payer: Self-pay | Admitting: Internal Medicine

## 2020-04-30 MED ORDER — BUSPIRONE HCL 5 MG PO TABS: ORAL_TABLET | ORAL | 0 refills | Status: DC

## 2020-05-06 NOTE — Progress Notes (Signed)
MEDICARE ANNUAL WELLNESS VISIT AND FOLLOW UP  Assessment:   Diagnoses and all orders for this visit:  Encounter for Medicare annual wellness exam  Atherosclerosis of aorta Control blood pressure, cholesterol, glucose, increase exercise.   Labile hypertension Has been at goal off of medications; atypical mild elevation today; was very low last visit; defer starting med Monitor blood pressure at home; call if consistently over 140/80 Continue DASH diet.   Reminder to go to the ER if any CP, SOB, nausea, dizziness, severe HA, changes vision/speech, left arm numbness and tingling and jaw pain.  Internal hemorrhoids PRN medicaitons  Allergic rhinitis, unspecified seasonality, unspecified trigger Continue OTC allergy pills PRN  Diverticulosis Add soluble fiber, denies concerning sx  Polyp of colon, unspecified part of colon, unspecified type Hx of, No further colonoscopies due to age, no concerning sx  Hypothyroidism, unspecified type continue medications the same pending lab results reminded to take on an empty stomach 30-31mins before food.  check TSH level  Osteoarthritis of multiple joints, unspecified osteoarthritis type Followed by ortho, s/p R TKA   Vitamin D deficiency At goal at recent check; continue to recommend supplementation for goal of 70-100 Defer vitamin D level  Personal history of malignant neoplasm of breast Continue annual mammograms  Mixed hyperlipidemia Low risk hx, in light of age discussed d/c, she is in agreement  Continue low cholesterol diet and exercise.  Defer checking as would not pursue aggressively secondary to age  Medication management CBC, CMP/GFR, magnesium  Insomnia, unspecified type Doing well on rare valium use PRN  Major depression in remission ([place]) Generalized anxiety disorder Does report some down days, interested in restarting daily agent Discussed and low dose lexapro 5 mg daily started Rare use of benzo. Continue to  monitor.  Stress management techniques discussed, increase water, good sleep hygiene discussed, increase exercise, and increase veggies.   BMI 23.0-23.9, adult  Continue to recommend diet heavy in fruits and veggies and low in animal meats, cheeses, and dairy products, appropriate calorie intake Discuss exercise recommendations routinely Continue to monitor weight at each visit  CKD IIIa (HCC) Increase fluids, avoid NSAIDS, monitor sugars, will monitor  PMR ([place]) Off of prednisone without recurrent sx; Dr. Estanislado Pandy following  Over 40 minutes of exam, counseling, chart review and critical decision making was performed Future Appointments  Date Time Provider [place]  06/04/2020 10:45 AM Bo Merino, MD [facility] None  08/13/2020 11:30 AM Unk Pinto, MD [facility] None  02/18/2021 11:00 AM Unk Pinto, MD [facility] None     Plan:   During the course of the visit the patient was educated and counseled about appropriate screening and preventive services including:    Pneumococcal vaccine   Prevnar 13  Influenza vaccine  Td vaccine  Screening electrocardiogram  Bone densitometry screening  Colorectal cancer screening  Diabetes screening  Glaucoma screening  Nutrition counseling   Advanced directives: requested   Subjective:  Alexandria Davies is a 87 y.o. female who presents for Medicare Annual Wellness Visit and 3 month follow up.   she has a diagnosis of anxiety and has been on valium 5 mg TID PRN. she currently takes very rarely, takes 2-3 a month or so when she cannot sleep.   She was diagnosed with PMR last year, follows with Dr. Duke Salvia has successfully tapered off of prednisone without recurrent sx.   BMI is Body mass index is 23.52 kg/m., she has been working on diet, exercise is somewhat limited due to joint pains (mainly  knees) recently, but generally active around her house and yard. She plans to get joint injection with Dr.  Norm Salt once done with prednisone taper which has helped in the past.  Wt Readings from Last 3 Encounters:  05/07/20 137 lb (62.1 kg)  04/17/20 132 lb (59.9 kg)  04/09/20 135 lb (61.2 kg)   She does not currently check BP at home, today their BP is BP: (!) 144/74 Similar by manual recheck by provider. Last check in office was low, 122/60, historically labile.  She does not workout. She denies chest pain, shortness of breath, dizziness.  She has aortic atherosclerosis per XR 11/2018.    She is on cholesterol medication (simvastatin 20 mg daily) and denies myalgias. Her cholesterol is at goal. The cholesterol last visit was:   Lab Results  Component Value Date   CHOL 172 02/06/2020   HDL 72 02/06/2020   LDLCALC 77 02/06/2020   TRIG 133 02/06/2020   CHOLHDL 2.4 02/06/2020   She has been working on diet and exercise for glucose management, and denies foot ulcerations, increased appetite, nausea, paresthesia of the feet, polydipsia, polyuria, visual disturbances, vomiting and weight loss. Last A1C in the office was:  Lab Results  Component Value Date   HGBA1C 5.1 02/06/2020   She is on thyroid medication. Her medication was not changed last visit. Synthroid 1 tab (150 mcg on Wednesday, and 1/2 tab all other days).   Lab Results  Component Value Date   TSH 2.75 02/06/2020   She has CKD IIIa monitored at this office. Last GFR: Lab Results  Component Value Date   GFRNONAA 55 (L) 04/09/2020   Patient is on Vitamin D supplement.   Lab Results  Component Value Date   VD25OH 102 (H) 02/06/2020      Medication Review: Current Outpatient Medications on File Prior to Visit  Medication Sig Dispense Refill   aspirin (ASPIRIN 81) 81 MG EC tablet Take 1 tablet daily     Cholecalciferol (VITAMIN D PO) Take 5,000 Units by mouth 2 (two) times daily.     clotrimazole-betamethasone (LOTRISONE) cream Apply 1 application topically 2 (two) times daily. (Patient taking differently: Apply 1  application topically as needed.) 30 g 1   diazepam (VALIUM) 5 MG tablet Take      1/2-1 tablet       2 - 3 x /day       ONLY      if needed for Anxiety Attack &  limit to 5 days /week to avoid addiction 60 tablet 0   furosemide (LASIX) 20 MG tablet Take 1 tab once daily in the morning as needed for leg swelling. Hold medication if any low blood pressures. 30 tablet 0   levothyroxine (SYNTHROID) 150 MCG tablet Take 1/2-1 tablet daily as directed on an empty stomach with only water for 30 minutes & no Antacid meds, Calcium or Magnesium for 4 hours & avoid Biotin 90 tablet 1   Probiotic Product (PROBIOTIC PO) Take 1 tablet by mouth daily.     simvastatin (ZOCOR) 20 MG tablet Take    1 tablet    at Bedtime      for Cholesterol 90 tablet 0   No current facility-administered medications on file prior to visit.    Allergies  Allergen Reactions   Ciprofloxacin    Epinephrine Other (See Comments)    Heart raced    Etodolac Hives   Synephrine     Palpitations    Flagyl [Metronidazole Hcl]  Rash    Current Problems (verified) Patient Active Problem List   Diagnosis Date Noted   Aortic atherosclerosis ([place]) by LS spine XR 11/2018 02/06/2020   Major depression in remission ([place]) 10/16/2019   CKD (chronic kidney disease) stage 3, GFR 30-59 ml/min (HCC) 07/15/2019   PMR (polymyalgia rheumatica) ([place]) 10/26/2018   Insomnia 10/22/2014   Generalized anxiety disorder 10/22/2014   Labile hypertension 02/14/2014   Hyperlipidemia, mixed 02/14/2014   Vitamin D deficiency 02/14/2014   Rhinitis, allergic 08/19/2011   Diverticulosis 04/23/2011   Colon polyps 04/23/2011   PERSONAL HISTORY OF MALIGNANT NEOPLASM OF BREAST 12/07/2007   Hypothyroidism 12/05/2007   Osteoarthritis 12/05/2007   Internal hemorrhoids 11/08/2002    Screening Tests Immunization History  Administered Date(s) Administered   Influenza Split 12/01/2010   Influenza, High Dose Seasonal PF 11/12/2015,  11/23/2016, 12/01/2017, 12/06/2018, 12/12/2019   Influenza-Unspecified 10/31/2012, 11/29/2014   PFIZER(Purple Top)SARS-COV-2 Vaccination 05/13/2019, 06/06/2019   Pneumococcal Conjugate-13 10/18/2013   Pneumococcal Polysaccharide-23 03/20/2010   Td 10/17/2012, 01/12/2020    Preventative care: Last colonoscopy: 2009, hx of polyps, done d/t age Mammogram:  08/2019 Bone Density:  04/2019 - forearm T -1.4 , due 2023  Prior vaccinations: TD or Tdap: 2014  Influenza: 2021  Pneumococcal: 2012 Prevnar13: 2015 Shingles/Zostavax: declines Covid 19: 2/2, 2021, pfizer - declined booster  Names of Other Physician/Practitioners you currently use: 1. [facility] here for primary care 2. Dr. Katy Fitch, eye doctor, last visit 2022 3. Dr. Randol Kern, dentist, last visit 2020, encouraged to schedule    Patient Care Team: Unk Pinto, MD as PCP - General (Internal Medicine) Melrose Nakayama, MD as Consulting Physician (Orthopedic Surgery) Deneise Lever, MD as Consulting Physician (Pulmonary Disease) Inda Castle, MD (Inactive) as Consulting Physician (Gastroenterology) Carolan Clines, MD (Inactive) as Consulting Physician (Urology)  SURGICAL HISTORY She  has a past surgical history that includes Abdominal hysterectomy; bladder tac; Bunionectomy; right shoulder (2003); Wrist fracture surgery (2003); Cystocele repair (2009); Tonsillectomy; Knee arthroscopy (Left, 01/17/2013); Colonoscopy (2009); Eye surgery (Bilateral); Total knee arthroplasty (Right, 11/26/2015); and Breast lumpectomy (2004). FAMILY HISTORY Her family history includes Alzheimer's disease in her mother and sister; Breast cancer in her sister; Cancer (age of onset: 13) in her sister; Cancer (age of onset: 4) in her sister; Colon cancer in her son; Heart disease in her mother; Testicular cancer in her father. SOCIAL HISTORY She  reports that she has never smoked. She has never used  smokeless tobacco. She reports current alcohol use. She reports that she does not use drugs.   MEDICARE WELLNESS OBJECTIVES: Physical activity: Current Exercise Habits: The patient does not participate in regular exercise at present, Exercise limited by: orthopedic condition(s) Cardiac risk factors: Cardiac Risk Factors include: advanced age (>36men, >52 women);dyslipidemia;hypertension;sedentary lifestyle Depression/mood screen:   Depression screen [facility] 2/9 05/07/2020  Decreased Interest 0  Down, Depressed, Hopeless 1  PHQ - 2 Score 1  Altered sleeping 0  Tired, decreased energy 0  Change in appetite 0  Feeling bad or failure about yourself  0  Trouble concentrating 0  Moving slowly or fidgety/restless 0  Suicidal thoughts 0  PHQ-9 Score 1  Difficult doing work/chores Not difficult at all    ADLs:  In your present state of health, do you have any difficulty performing the following activities: 05/07/2020 02/05/2020  Hearing? N N  Comment has R hearing aid -  Vision? N N  Difficulty concentrating or making decisions? N N  Walking or climbing stairs? N  N  Dressing or bathing? N N  Doing errands, shopping? N N  Some recent data might be hidden     Cognitive Testing  Alert? Yes  Normal Appearance?Yes  Oriented to person? Yes  Place? Yes   Time? Yes  Recall of three objects?  Yes  Can perform simple calculations? Yes  Displays appropriate judgment?Yes  Can read the correct time from a watch face?Yes  EOL planning: Does Patient Have a Medical Advance Directive?: Yes Type of Advance Directive: [place] will Does patient want to make changes to medical advance directive?: No - Patient declined Copy of [place] in Chart?: No - copy requested    Objective:     Today's Vitals   05/07/20 1139 05/07/20 1211  BP: (!) 144/70 (!) 144/74  Pulse: 66   Temp: (!) 97.3 F (36.3 C)   SpO2: 97%   Weight: 137 lb (62.1 kg)    Body mass  index is 23.52 kg/m.  General appearance: alert, no distress, WD/WN, female HEENT: normocephalic, sclerae anicteric, TMs pearly, nares patent, no discharge or erythema, pharynx normal Oral cavity: MMM, no lesions Neck: supple, no lymphadenopathy, no thyromegaly, no masses Heart: RRR, normal S1, S2, no murmurs Lungs: CTA bilaterally, no wheezes, rhonchi, or rales Abdomen: +bs, soft, non tender, non distended, no masses, no hepatomegaly, no splenomegaly Musculoskeletal: nontender, no swelling, no obvious deformity Extremities: no edema, no cyanosis, no clubbing Pulses: 2+ symmetric, upper and lower extremities, normal cap refill Neurological: alert, oriented x 3, CN2-12 intact, strength normal upper extremities and lower extremities, sensation normal throughout, DTRs 2+ throughout, no cerebellar signs, gait normal Psychiatric: normal affect, behavior normal, pleasant   Medicare Attestation I have personally reviewed: The patient's medical and social history Their use of alcohol, tobacco or illicit drugs Their current medications and supplements The patient's functional ability including ADLs,fall risks, home safety risks, cognitive, and hearing and visual impairment Diet and physical activities Evidence for depression or mood disorders  The patient's weight, height, BMI, and visual acuity have been recorded in the chart.  I have made referrals, counseling, and provided education to the patient based on review of the above and I have provided the patient with a written personalized care plan for preventive services.     Izora Ribas, NP   05/07/2020

## 2020-05-07 ENCOUNTER — Ambulatory Visit (INDEPENDENT_AMBULATORY_CARE_PROVIDER_SITE_OTHER): Payer: Medicare HMO | Admitting: Adult Health

## 2020-05-07 ENCOUNTER — Encounter: Payer: Self-pay | Admitting: Adult Health

## 2020-05-07 ENCOUNTER — Other Ambulatory Visit: Payer: Self-pay

## 2020-05-07 VITALS — BP 144/74 | HR 66 | Temp 97.3°F | Wt 137.0 lb

## 2020-05-07 DIAGNOSIS — R0989 Other specified symptoms and signs involving the circulatory and respiratory systems: Secondary | ICD-10-CM | POA: Diagnosis not present

## 2020-05-07 DIAGNOSIS — M353 Polymyalgia rheumatica: Secondary | ICD-10-CM | POA: Diagnosis not present

## 2020-05-07 DIAGNOSIS — K5790 Diverticulosis of intestine, part unspecified, without perforation or abscess without bleeding: Secondary | ICD-10-CM

## 2020-05-07 DIAGNOSIS — K648 Other hemorrhoids: Secondary | ICD-10-CM

## 2020-05-07 DIAGNOSIS — G4700 Insomnia, unspecified: Secondary | ICD-10-CM | POA: Diagnosis not present

## 2020-05-07 DIAGNOSIS — E559 Vitamin D deficiency, unspecified: Secondary | ICD-10-CM

## 2020-05-07 DIAGNOSIS — Z0000 Encounter for general adult medical examination without abnormal findings: Secondary | ICD-10-CM

## 2020-05-07 DIAGNOSIS — E039 Hypothyroidism, unspecified: Secondary | ICD-10-CM | POA: Diagnosis not present

## 2020-05-07 DIAGNOSIS — Z6824 Body mass index (BMI) 24.0-24.9, adult: Secondary | ICD-10-CM

## 2020-05-07 DIAGNOSIS — Z0001 Encounter for general adult medical examination with abnormal findings: Secondary | ICD-10-CM | POA: Diagnosis not present

## 2020-05-07 DIAGNOSIS — F331 Major depressive disorder, recurrent, moderate: Secondary | ICD-10-CM

## 2020-05-07 DIAGNOSIS — M159 Polyosteoarthritis, unspecified: Secondary | ICD-10-CM

## 2020-05-07 DIAGNOSIS — R6889 Other general symptoms and signs: Secondary | ICD-10-CM | POA: Diagnosis not present

## 2020-05-07 DIAGNOSIS — N1831 Chronic kidney disease, stage 3a: Secondary | ICD-10-CM | POA: Diagnosis not present

## 2020-05-07 DIAGNOSIS — J309 Allergic rhinitis, unspecified: Secondary | ICD-10-CM

## 2020-05-07 DIAGNOSIS — E782 Mixed hyperlipidemia: Secondary | ICD-10-CM | POA: Diagnosis not present

## 2020-05-07 DIAGNOSIS — F411 Generalized anxiety disorder: Secondary | ICD-10-CM

## 2020-05-07 DIAGNOSIS — I700 Atherosclerosis of aorta: Secondary | ICD-10-CM

## 2020-05-07 DIAGNOSIS — F325 Major depressive disorder, single episode, in full remission: Secondary | ICD-10-CM

## 2020-05-07 DIAGNOSIS — R69 Illness, unspecified: Secondary | ICD-10-CM | POA: Diagnosis not present

## 2020-05-07 DIAGNOSIS — K635 Polyp of colon: Secondary | ICD-10-CM

## 2020-05-07 DIAGNOSIS — Z853 Personal history of malignant neoplasm of breast: Secondary | ICD-10-CM

## 2020-05-07 MED ORDER — ESCITALOPRAM OXALATE 5 MG PO TABS: ORAL_TABLET | ORAL | 3 refills | Status: DC

## 2020-05-07 NOTE — Patient Instructions (Signed)
[name] , Thank you for taking time to come for your Medicare Wellness Visit. I appreciate your ongoing commitment to your health goals. Please review the following plan we discussed and let me know if I can assist you in the future.   These are the goals we discussed: Goals    . Blood Pressure < 140/90    . Exercise 150 min/wk Moderate Activity     Aim for 20-30 min daily       This is a list of the screening recommended for you and due dates:  Health Maintenance  Topic Date Due  . COVID-19 Vaccine (3 - Booster for Pfizer series) 12/06/2019  . Tetanus Vaccine  01/11/2030  . Flu Shot  Completed  . DEXA scan (bone density measurement)  Completed  . Pneumonia vaccines  Completed  . HPV Vaccine  Aged [employer] Information Description of Services Cost  A Matter of Balance Class locations vary. Call [place] on Aging for more information.  [email or web address]/ [phone] 8-Session program addressing the fear of falling and increasing activity levels of older adults Free to minimal cost  A.C.T. By [employer] [address], [place], [place] [address].  BetaBlues.dk [phone]  Personal training, gym, classes including Silver Sneakers* and ACTion for Aging Adults Fee-based  A.H.O.Y. (Add Health to [place]) Airs on Time [employer] 13, M-F at [place]: [employer],  [place] [facility] [place],  [place], [place] [facility], [address] [place], [place], [place], [place] [address]  [place] Location: Sharrell Ku. [employer] [place]       [phone]  [phone]  [phone]  [phone]  [phone]  [phone]  [phone]  [phone]  [phone]  [phone]    [phone] A total-body conditioning class for adults 106 and older; designed to increase muscular strength, endurance, range of movement, flexibility, balance, agility and coordination Free  [facility] [place], [place] [address] [address]      [address]      [phone]      Pilate's class for individualsreturning to exercise after an injury, before or after surgery or for individuals with complex musculoskeletal issues; designed to improve strength, balance , flexibility      $15/class  [place] [address] [place], [place] [address] [email or web address] [place] classes for beginners to advanced [place], [place] [address] [email or web address] [place] Chair Exercises Free, ages 34 and older; Ages 39-59 fee based  Marvia Pickles, [facility] [address] [place],  [address] [email or web address] .Beverlee Nims [phone]  A.H.O.Y. Tai Chi Fee-based Donation based or free  [place] Class locations vary.  Call or email Angela Burke or view website for more information. [email or web address] [email or web address] [phone] Ongoing classes at local YMCAs and gyms Fee-based  Silver Sneakers A.C.T. By [place] [employer]: [place]  [place] [facility] [place] [phone]  [phone] Classes designed for older adults who want to improve their strength, flexibility, balance and endurance.   Silver sneakers is covered by some insurance plans and includes a fitness center membership at participating locations. Find out more by calling [phone] or visiting [email or web address] Covered by some insurance plans  [facility] [place] [phone] A.H.O.Y., fitness room, personal training, fitness classes for injury prevention, strength, balance, flexibility, water fitness classes Ages 55+: $49 for 6 months; Ages 56-54: $89 for 6 months  Tai Chi for Everybody [facility] [address] [place], [place] [address] [email or web address] Sears [phone] Tai Chi classes for beginners to advanced; geared for seniors Donation Based      [employer] ([place] Others Participate in Exercise     Loyal Gambler. Rosana Hoes, PhD, [place] [email or web address] [place]     [phone]     A comprehensive fitness program for adults.  The program paris senior-level undergraduates Kinesiology students with adults who desire to learn how to exercise safely.  Includes a structural exercise class focusing on functional fitnesss     $100/semester in fall and spring; $75 in summer (no trainers)    *Silver Sneakers is covered by some Personal assistant and includes a  Radio producer at participating locations.  Find out more by calling [phone] or visiting [email or web address]  For additional health and human services resources for senior adults, please contact SeniorLine at [phone] in [place] and [place] at [phone] in all other areas.

## 2020-05-08 LAB — LIPID PANEL
Cholesterol: 178 mg/dL (ref <200)
HDL: 69 mg/dL (ref >=50)
LDL Cholesterol (Calc): 86 mg/dL (calc)
Non-HDL Cholesterol (Calc): 109 mg/dL (calc) (ref <130)
Total CHOL/HDL Ratio: 2.6 (calc) (ref <5.0)
Triglycerides: 130 mg/dL (ref <150)

## 2020-05-08 LAB — CBC WITH DIFFERENTIAL/PLATELET
Absolute Monocytes: 548 cells/uL (ref 200–950)
Basophils Absolute: 50 cells/uL (ref 0–200)
Basophils Relative: 0.8 %
Eosinophils Absolute: 88 cells/uL (ref 15–500)
Eosinophils Relative: 1.4 %
HCT: 40.9 % (ref 35.0–45.0)
Hemoglobin: 13.6 g/dL (ref 11.7–15.5)
Lymphs Abs: 1833 cells/uL (ref 850–3900)
MCH: 29.5 pg (ref 27.0–33.0)
MCHC: 33.3 g/dL (ref 32.0–36.0)
MCV: 88.7 fL (ref 80.0–100.0)
MPV: 10.4 fL (ref 7.5–12.5)
Monocytes Relative: 8.7 %
Neutro Abs: 3780 cells/uL (ref 1500–7800)
Neutrophils Relative %: 60 %
Platelets: 265 10*3/uL (ref 140–400)
RBC: 4.61 10*6/uL (ref 3.80–5.10)
RDW: 12.5 % (ref 11.0–15.0)
Total Lymphocyte: 29.1 %
WBC: 6.3 10*3/uL (ref 3.8–10.8)

## 2020-05-08 LAB — COMPLETE METABOLIC PANEL WITH GFR
AG Ratio: 2.3 (calc) (ref 1.0–2.5)
ALT: 12 U/L (ref 6–29)
AST: 15 U/L (ref 10–35)
Albumin: 4.3 g/dL (ref 3.6–5.1)
Alkaline phosphatase (APISO): 73 U/L (ref 37–153)
BUN: 21 mg/dL (ref 7–25)
CO2: 26 mmol/L (ref 20–32)
Calcium: 10 mg/dL (ref 8.6–10.4)
Chloride: 108 mmol/L (ref 98–110)
Creat: 0.78 mg/dL (ref 0.60–0.88)
GFR, Est African American: 76 mL/min/{1.73_m2} (ref >=60)
GFR, Est Non African American: 66 mL/min/{1.73_m2} (ref >=60)
Globulin: 1.9 g/dL (calc) (ref 1.9–3.7)
Glucose, Bld: 96 mg/dL (ref 65–99)
Potassium: 4.5 mmol/L (ref 3.5–5.3)
Sodium: 143 mmol/L (ref 135–146)
Total Bilirubin: 1 mg/dL (ref 0.2–1.2)
Total Protein: 6.2 g/dL (ref 6.1–8.1)

## 2020-05-08 LAB — MAGNESIUM: Magnesium: 2.1 mg/dL (ref 1.5–2.5)

## 2020-05-08 LAB — TSH: TSH: 1.33 mIU/L (ref 0.40–4.50)

## 2020-05-20 ENCOUNTER — Telehealth: Payer: Self-pay

## 2020-05-20 ENCOUNTER — Other Ambulatory Visit: Payer: Self-pay | Admitting: Adult Health

## 2020-05-20 MED ORDER — DIAZEPAM 5 MG PO TABS: ORAL_TABLET | ORAL | 0 refills | Status: DC

## 2020-05-20 MED ORDER — SIMVASTATIN 20 MG PO TABS: ORAL_TABLET | ORAL | 3 refills | Status: DC

## 2020-05-20 MED ORDER — SIMVASTATIN 20 MG PO TABS: ORAL_TABLET | ORAL | 0 refills | Status: DC

## 2020-05-20 NOTE — Telephone Encounter (Signed)
Refill request for Simvastatin and Diazepam.

## 2020-05-21 NOTE — Progress Notes (Deleted)
Office Visit Note  Patient: Alexandria Davies             Date of Birth: [birth year]           MRN: [id number]             PCP: Unk Pinto, MD Referring: Unk Pinto, MD Visit Date: 06/04/2020 Occupation: @GUAROCC @  Subjective:  No chief complaint on file.   History of Present Illness: Alexandria Davies is a 87 y.o. female ***   Activities of Daily Living:  Patient reports morning stiffness for *** {minute/hour:19697}.   Patient {ACTIONS;DENIES/REPORTS:21021675::"Denies"} nocturnal pain.  Difficulty dressing/grooming: {ACTIONS;DENIES/REPORTS:21021675::"Denies"} Difficulty climbing stairs: {ACTIONS;DENIES/REPORTS:21021675::"Denies"} Difficulty getting out of chair: {ACTIONS;DENIES/REPORTS:21021675::"Denies"} Difficulty using hands for taps, buttons, cutlery, and/or writing: {ACTIONS;DENIES/REPORTS:21021675::"Denies"}  No Rheumatology ROS completed.   PMFS History:  Patient Active Problem List   Diagnosis Date Noted  . Aortic atherosclerosis ([place]) by LS spine XR 11/2018 02/06/2020  . Major depression in remission ([place]) 10/16/2019  . CKD (chronic kidney disease) stage 3, GFR 30-59 ml/min (HCC) 07/15/2019  . PMR (polymyalgia rheumatica) ([place]) 10/26/2018  . Insomnia 10/22/2014  . Generalized anxiety disorder 10/22/2014  . Labile hypertension 02/14/2014  . Hyperlipidemia, mixed 02/14/2014  . Vitamin D deficiency 02/14/2014  . Rhinitis, allergic 08/19/2011  . Diverticulosis 04/23/2011  . Colon polyps 04/23/2011  . PERSONAL HISTORY OF MALIGNANT NEOPLASM OF BREAST 12/07/2007  . Hypothyroidism 12/05/2007  . Osteoarthritis 12/05/2007  . Internal hemorrhoids 11/08/2002    Past Medical History:  Diagnosis Date  . Anxiety   . Breast cancer ([place])    left  . Colon polyp 2009   BENIGN POLYPOID  . Diverticulosis of colon (without mention of hemorrhage) 2009  . DJD (degenerative joint disease)   . Family history of malignant neoplasm of gastrointestinal tract 05/06/2011   . HOH (hard of hearing)    Right ear  . Hypercholesteremia   . Hypothyroidism   . IBS (irritable bowel syndrome)   . Internal hemorrhoids without mention of complication   . Primary osteoarthritis of right knee 11/26/2015   S/p TKA  . Sinus drainage   . Vitiligo   . Wears dentures    top  . Wears glasses     Family History  Problem Relation Age of Onset  . Alzheimer's disease Sister   . Cancer Sister 32       breast  . Breast cancer Sister   . Cancer Sister 17       breast cancer  . Colon cancer Son   . Heart disease Mother        died at age 18  . Alzheimer's disease Mother   . Testicular cancer Father    Past Surgical History:  Procedure Laterality Date  . ABDOMINAL HYSTERECTOMY    . bladder tac    . BREAST LUMPECTOMY  2004   left lump-snbx  . BUNIONECTOMY     bilateral  . COLONOSCOPY  2009   several  . CYSTOCELE REPAIR  2009  . EYE SURGERY Bilateral    Cataract removal  . KNEE ARTHROSCOPY Left 01/17/2013   Procedure: ARTHROSCOPY LEFT KNEE;  Surgeon: Hessie Dibble, MD;  Location: [facility];  Service: Orthopedics;  Laterality: Left;  partial medial and partial lateral chondroplasty and removal loose body  . right shoulder  2003   bone spur removed-rcr  . TONSILLECTOMY    . TOTAL KNEE ARTHROPLASTY Right 11/26/2015   Procedure: TOTAL KNEE ARTHROPLASTY;  Surgeon: Melrose Nakayama, MD;  Location: [facility];  Service: Orthopedics;  Laterality: Right;  . WRIST FRACTURE SURGERY  2003   left-lipoma   Social History   Social History Narrative  . Not on file   Immunization History  Administered Date(s) Administered  . Influenza Split 12/01/2010  . Influenza, High Dose Seasonal PF 11/12/2015, 11/23/2016, 12/01/2017, 12/06/2018, 12/12/2019  . Influenza-Unspecified 10/31/2012, 11/29/2014  . PFIZER(Purple Top)SARS-COV-2 Vaccination 05/13/2019, 06/06/2019  . Pneumococcal Conjugate-13 10/18/2013  . Pneumococcal Polysaccharide-23 03/20/2010  . Td  10/17/2012, 01/12/2020     Objective: Vital Signs: There were no vitals taken for this visit.   Physical Exam   Musculoskeletal Exam: ***  CDAI Exam: CDAI Score: - Patient Global: -; Provider Global: - Swollen: -; Tender: - Joint Exam 06/04/2020   No joint exam has been documented for this visit   There is currently no information documented on the homunculus. Go to the Rheumatology activity and complete the homunculus joint exam.  Investigation: No additional findings.  Imaging: No results found.  Recent Labs: Lab Results  Component Value Date   WBC 6.3 05/07/2020   HGB 13.6 05/07/2020   PLT 265 05/07/2020   NA 143 05/07/2020   K 4.5 05/07/2020   CL 108 05/07/2020   CO2 26 05/07/2020   GLUCOSE 96 05/07/2020   BUN 21 05/07/2020   CREATININE 0.78 05/07/2020   BILITOT 1.0 05/07/2020   ALKPHOS 62 04/23/2016   AST 15 05/07/2020   ALT 12 05/07/2020   PROT 6.2 05/07/2020   ALBUMIN 4.2 04/23/2016   CALCIUM 10.0 05/07/2020   GFRAA 76 05/07/2020    Speciality Comments: No specialty comments available.  Procedures:  No procedures performed Allergies: Ciprofloxacin, Epinephrine, Etodolac, Synephrine, and Flagyl [metronidazole hcl]   Assessment / Plan:     Visit Diagnoses: No diagnosis found.  Orders: No orders of the defined types were placed in this encounter.  No orders of the defined types were placed in this encounter.   Face-to-face time spent with patient was *** minutes. Greater than 50% of time was spent in counseling and coordination of care.  Follow-Up Instructions: No follow-ups on file.   Earnestine Mealing, CMA  Note - This record has been created using Editor, commissioning.  Chart creation errors have been sought, but may not always  have been located. Such creation errors do not reflect on  the standard of medical care.

## 2020-05-22 ENCOUNTER — Other Ambulatory Visit: Payer: Self-pay | Admitting: Adult Health

## 2020-05-22 ENCOUNTER — Telehealth: Payer: Self-pay

## 2020-05-22 ENCOUNTER — Other Ambulatory Visit: Payer: Self-pay

## 2020-05-22 MED ORDER — DIAZEPAM 5 MG PO TABS: ORAL_TABLET | ORAL | 0 refills | Status: DC

## 2020-05-22 MED ORDER — SIMVASTATIN 20 MG PO TABS: ORAL_TABLET | ORAL | 3 refills | Status: DC

## 2020-05-22 NOTE — Telephone Encounter (Signed)
Please resend Diazepam to mail order pharmacy.

## 2020-05-22 NOTE — Progress Notes (Signed)
Future Appointments  Date Time Provider [place]  06/04/2020 10:45 AM Bo Merino, MD [facility] None  08/13/2020 11:30 AM Unk Pinto, MD [facility] None  02/18/2021 11:00 AM Unk Pinto, MD [facility] None  06/17/2021 11:00 AM Liane Comber, NP [facility] None   PDMP reviewed for diazepam refill request.

## 2020-05-23 ENCOUNTER — Ambulatory Visit (INDEPENDENT_AMBULATORY_CARE_PROVIDER_SITE_OTHER): Payer: Medicare HMO | Admitting: Internal Medicine

## 2020-05-23 ENCOUNTER — Encounter: Payer: Self-pay | Admitting: Internal Medicine

## 2020-05-23 ENCOUNTER — Other Ambulatory Visit: Payer: Self-pay

## 2020-05-23 VITALS — BP 138/70 | HR 68 | Temp 97.5°F | Resp 16 | Ht 64.0 in | Wt 135.4 lb

## 2020-05-23 DIAGNOSIS — L03116 Cellulitis of left lower limb: Secondary | ICD-10-CM | POA: Diagnosis not present

## 2020-05-23 MED ORDER — DOXYCYCLINE HYCLATE 100 MG PO CAPS: ORAL_CAPSULE | ORAL | 0 refills | Status: DC

## 2020-05-23 MED ORDER — DEXAMETHASONE 2 MG PO TABS: ORAL_TABLET | ORAL | 1 refills | Status: DC

## 2020-05-23 NOTE — Progress Notes (Signed)
.      History of Present Illness:     Patient is a very nice 87 y.o.  [name]  with  HTN, HLD, Hypothyroidism,  Prediabetes,  IBS-D and Vitamin D Deficiency who developed a cellulitis about her left lateral ankle in   February and was treated with resolution. When last seen in office  About 2 weeks ago the cellulitis had resolved.  She presents today with c/o of a 4-5 day prodrome of worsening pain in the same region.  Denies fevers, chills, sweats, rash, Respiratory, GI or GU sx's.   Medications  .  levothyroxine (SYNTHROID) 150 MCG tablet, Take 1/2-1 tablet daily  .  furosemide (LASIX) 20 MG tablet, Take 1 tab once daily   .  simvastatin (ZOCOR) 20 MG tablet, Take one tablet at bedtime  .  aspirin (ASPIRIN 81) 81 MG EC tablet, Take 1 tablet daily   .  VITAMIN D , Take 5,000 Units 2  times daily.  Marland Kitchen  LOTRISONE cream, Apply 1 application topically 2 times daily.   .  diazepam  5 MG tablet, Take 1/2-1 tablet 2 - 3 x /day ONLY if needed   .  escitalopram  5 MG tablet, Take 1 tablet daily  .  Probiotic , Take 1 tablet daily.  Problem list She has Hypothyroidism; Internal hemorrhoids; Osteoarthritis; PERSONAL HISTORY OF MALIGNANT NEOPLASM OF BREAST; Diverticulosis; Colon polyps; Rhinitis, allergic; Labile hypertension; Hyperlipidemia, mixed; Vitamin D deficiency; Insomnia; Generalized anxiety disorder; PMR (polymyalgia rheumatica) (HCC); CKD (chronic kidney disease) stage 3, GFR 30-59 ml/min ([place]); Major depression in remission [facility]); and Aortic atherosclerosis ([place]) by LS spine XR 11/2018 on their problem list.   Observations/Objective:  Pulse 68   Temp (!) 97.5 F (36.4 C)   Resp 16   Ht 5\' 4"  (1.626 m)   Wt 135 lb 6.4 oz (61.4 kg)   SpO2 99%   BMI 23.24 kg/m   HEENT - WNL. Neck - supple.  Chest - Clear equal BS. Cor - Nl HS. RRR w/o sig MGR. PP 1(+). No edema. MS- FROM w/o deformities.  Gait Nl. The proximal lateral Left ankle clearly has an area of STS which is very tender,  but no fluctuance can be palpitated.  No skin changes as erythema or or warmth are appreciated.  Neuro -  Nl w/o focal abnormalities.  Assessment and Plan:   1. Cellulitis of left ankle (suspected early cellulitis)   - dexamethasone  2 MG tablet; Take 1 tab 3 x /day for 2 days,  then 2 x /day for 2  Days, then 1 tab daily  Disp: 13 tab; Rf: 1  - doxycycline  100 MG; Take  1 cap 2 x /day  with meals  for 10 days , then take 1 cap Daily with food for Infection  Disp: 40 caps  Follow Up Instructions:      I discussed the assessment and treatment plan with the patient. The patient was provided an opportunity to ask questions and all were answered. The patient agreed with the plan and demonstrated an understanding of the instructions.       The patient was advised to call back or seek an in-person evaluation if the symptoms worsen or if the condition fails to improve as anticipated.   Kirtland Bouchard, MD

## 2020-05-23 NOTE — Patient Instructions (Signed)
Cellulitis, Adult  Cellulitis is a skin infection. The infected area is usually warm, red, swollen, and tender. This condition occurs most often in the arms and lower legs. The infection can travel to the muscles, blood, and underlying tissue and become serious. It is very important to get treated for this condition. What are the causes? Cellulitis is caused by bacteria. The bacteria enter through a break in the skin, such as a cut, burn, insect bite, open sore, or crack. What increases the risk? This condition is more likely to occur in people who:  Have a weak body defense system (immune system).  Have open wounds on the skin, such as cuts, burns, bites, and scrapes. Bacteria can enter the body through these open wounds.  Are older than 85 years of age.  Have diabetes.  Have a type of long-lasting (chronic) liver disease (cirrhosis) or kidney disease.  Are obese.  Have a skin condition such as: ? Itchy rash (eczema). ? Slow movement of blood in the veins (venous stasis). ? Fluid buildup below the skin (edema).  Have had radiation therapy.  Use IV drugs. What are the signs or symptoms? Symptoms of this condition include:  Redness, streaking, or spotting on the skin.  Swollen area of the skin.  Tenderness or pain when an area of the skin is touched.  Warm skin.  A fever.  Chills.  Blisters. How is this diagnosed? This condition is diagnosed based on a medical history and physical exam. You may also have tests, including:  Blood tests.  Imaging tests. How is this treated? Treatment for this condition may include:  Medicines, such as antibiotic medicines or medicines to treat allergies (antihistamines).  Supportive care, such as rest and application of cold or warm cloths (compresses) to the skin.  Hospital care, if the condition is severe. The infection usually starts to get better within 1-2 days of treatment. Follow these instructions at  home: Medicines  Take over-the-counter and prescription medicines only as told by your health care provider.  If you were prescribed an antibiotic medicine, take it as told by your health care provider. Do not stop taking the antibiotic even if you start to feel better. General instructions  Drink enough fluid to keep your urine pale yellow.  Do not touch or rub the infected area.  Raise (elevate) the infected area above the level of your heart while you are sitting or lying down.  Apply warm or cold compresses to the affected area as told by your health care provider.  Keep all follow-up visits as told by your health care provider. This is important. These visits let your health care provider make sure a more serious infection is not developing.  Contact a health care provider if:  You have a fever.  Your symptoms do not begin to improve within 1-2 days of starting treatment.  Your bone or joint underneath the infected area becomes painful after the skin has healed.  Your infection returns in the same area or another area.  You notice a swollen bump in the infected area.  You develop new symptoms.  You have a general ill feeling (malaise) with muscle aches and pains. Get help right away if:  Your symptoms get worse.  You feel very sleepy.  You develop vomiting or diarrhea that persists.  You notice red streaks coming from the infected area.  Your red area gets larger or turns dark in color. These symptoms may represent a serious problem that is an  emergency. Do not wait to see if the symptoms will go away. Get medical help right away. Call your local emergency services (911 in the [place] Do not drive yourself to the hospital. Summary  Cellulitis is a skin infection. This condition occurs most often in the arms and lower legs.  Treatment for this condition may include medicines, such as antibiotic medicines or antihistamines.  Take over-the-counter and prescription  medicines only as told by your health care provider. If you were prescribed an antibiotic medicine, do not stop taking the antibiotic even if you start to feel better.  Contact a health care provider if your symptoms do not begin to improve within 1-2 days of starting treatment or your symptoms get worse.  Keep all follow-up visits as told by your health care provider. This is important. These visits let your health care provider make sure that a more serious infection is not developing.

## 2020-06-04 ENCOUNTER — Ambulatory Visit: Payer: Medicare HMO | Admitting: Rheumatology

## 2020-06-04 DIAGNOSIS — M353 Polymyalgia rheumatica: Secondary | ICD-10-CM

## 2020-06-04 DIAGNOSIS — R7982 Elevated C-reactive protein (CRP): Secondary | ICD-10-CM

## 2020-06-04 DIAGNOSIS — E559 Vitamin D deficiency, unspecified: Secondary | ICD-10-CM

## 2020-06-04 DIAGNOSIS — R768 Other specified abnormal immunological findings in serum: Secondary | ICD-10-CM

## 2020-06-04 DIAGNOSIS — Z8639 Personal history of other endocrine, nutritional and metabolic disease: Secondary | ICD-10-CM

## 2020-06-04 DIAGNOSIS — M8589 Other specified disorders of bone density and structure, multiple sites: Secondary | ICD-10-CM

## 2020-06-04 DIAGNOSIS — E785 Hyperlipidemia, unspecified: Secondary | ICD-10-CM

## 2020-06-04 DIAGNOSIS — Z8659 Personal history of other mental and behavioral disorders: Secondary | ICD-10-CM

## 2020-06-04 DIAGNOSIS — M7072 Other bursitis of hip, left hip: Secondary | ICD-10-CM

## 2020-06-04 DIAGNOSIS — Z96651 Presence of right artificial knee joint: Secondary | ICD-10-CM

## 2020-07-09 ENCOUNTER — Other Ambulatory Visit: Payer: Self-pay | Admitting: Internal Medicine

## 2020-07-09 DIAGNOSIS — Z1231 Encounter for screening mammogram for malignant neoplasm of breast: Secondary | ICD-10-CM

## 2020-07-31 DIAGNOSIS — M1712 Unilateral primary osteoarthritis, left knee: Secondary | ICD-10-CM | POA: Diagnosis not present

## 2020-08-12 ENCOUNTER — Encounter: Payer: Self-pay | Admitting: Internal Medicine

## 2020-08-12 NOTE — Progress Notes (Signed)
Future Appointments  Date Time Provider [place]  08/13/2020 11:30 AM Unk Pinto, MD [facility] None  02/18/2021  -  CPE 11:00 AM Unk Pinto, MD [facility] None  06/17/2021 - Wellness 11:00 AM Liane Comber, NP [facility] None    History of Present Illness:       This very nice 87 y.o. [name] presents for 6  month follow up with HTN, HLD, Hypothyroidism, Prediabetes, IBS-D and Vitamin D Deficiency. Patient has Aortic atherosclerosis by LS spine XR  (11/2018). In Oct 2020, patient was dx'd with PMR treated with steroids and slow gradual taper finally d/c'd  about Sept /Oct 2021. LS X-ray in 2020 showed Aortic Atherosclerosis.       Patient has mild labile HTN & followed expectantly BP has been controlled at home. Today's BP is at goal - 120/70. Patient has had no complaints of any cardiac type chest pain, palpitations, dyspnea / orthopnea / PND, dizziness, claudication, or dependent edema.       Hyperlipidemia is controlled with diet & Simvastatin.  Patient denies myalgias or other med SE's. Last Lipids were at goal:  Lab Results  Component Value Date   CHOL 178 05/07/2020   HDL 69 05/07/2020   LDLCALC 86 05/07/2020   TRIG 130 05/07/2020   CHOLHDL 2.6 05/07/2020     Also, the patient is followed expectantly for glucose intolerance and has had no symptoms of reactive hypoglycemia, diabetic polys, paresthesias or visual blurring.  Last A1c was normal & at goal:  Lab Results  Component Value Date   HGBA1C 5.1 02/06/2020                                          Patient  has been on thyroid replacement since she was dx'd Hypothyroid in 2000.       Further, the patient also has history of Vitamin D Deficiency ("37" /2011) and supplements vitamin D without any suspected side-effects. Last vitamin D was at goal:   Lab Results  Component Value Date   VD25OH 102 (H) 02/06/2020     Current Outpatient Medications on File Prior to Visit  Medication Sig    aspirin 81 MG EC tablet Take 1 tablet daily   VITAMIN D  5,000 Units  Take  2 (two) times daily.   LOTRISONE cream Apply 2 (two) times daily as needed.)   diazepam 5 MG tablet Take 1/2-1 tablet 2 - 3 x /day ONLY if needed    levothyroxine  150 MCG tablet Take 1/2-1 tablet daily    PROBIOTIC  Take 1 tablet daily.   simvastatin  20 MG tablet Take one tablet at bedtime for Cholesterol.      Allergies  Allergen Reactions   Ciprofloxacin    Epinephrine Other (See Comments)    Heart raced    Etodolac Hives   Synephrine     Palpitations    Flagyl [Metronidazole Hcl] Rash     PMHx:   Past Medical History:  Diagnosis Date   Anxiety    Breast cancer ([place])    left   Colon polyp 2009   BENIGN POLYPOID   Diverticulosis of colon (without mention of hemorrhage) 2009   DJD (degenerative joint disease)    Family history of malignant neoplasm of gastrointestinal tract 05/06/2011   HOH (hard of hearing)    Right ear  Hypercholesteremia    Hypothyroidism    IBS (irritable bowel syndrome)    Internal hemorrhoids without mention of complication    Primary osteoarthritis of right knee 11/26/2015   S/p TKA   Sinus drainage    Vitiligo    Wears dentures    top   Wears glasses      Immunization History  Administered Date(s) Administered   Influenza Split 12/01/2010   Influenza, High Dose  12/01/2017, 12/06/2018, 12/12/2019   Influenza 10/31/2012, 11/29/2014   PFIZER  SARS-COV-2 Vacc 05/13/2019, 06/06/2019   Pneumococcal-13 10/18/2013   Pneumococcal -23 03/20/2010   Td 10/17/2012, 01/12/2020     Past Surgical History:  Procedure Laterality Date   ABDOMINAL HYSTERECTOMY     bladder tac     BREAST LUMPECTOMY  2004   left lump-snbx   BUNIONECTOMY     bilateral   COLONOSCOPY  2009   several   CYSTOCELE REPAIR  2009   EYE SURGERY Bilateral    Cataract removal   KNEE ARTHROSCOPY Left 01/17/2013   Procedure: ARTHROSCOPY LEFT KNEE;  Surgeon: Hessie Dibble, MD;  Location:  [place];  Service: Orthopedics;  Laterality: Left;  partial medial and partial lateral chondroplasty and removal loose body   right shoulder  2003   bone spur removed-rcr   TONSILLECTOMY     TOTAL KNEE ARTHROPLASTY Right 11/26/2015   Procedure: TOTAL KNEE ARTHROPLASTY;  Surgeon: Melrose Nakayama, MD;  Location: [place];  Service: Orthopedics;  Laterality: Right;   WRIST FRACTURE SURGERY  2003   left-lipoma    FHx:    Reviewed / unchanged  SHx:    Reviewed / unchanged   Systems Review:  Constitutional: Denies fever, chills, wt changes, headaches, insomnia, fatigue, night sweats, change in appetite. Eyes: Denies redness, blurred vision, diplopia, discharge, itchy, watery eyes.  ENT: Denies discharge, congestion, post nasal drip, epistaxis, sore throat, earache, hearing loss, dental pain, tinnitus, vertigo, sinus pain, snoring.  CV: Denies chest pain, palpitations, irregular heartbeat, syncope, dyspnea, diaphoresis, orthopnea, PND, claudication or edema. Respiratory: denies cough, dyspnea, DOE, pleurisy, hoarseness, laryngitis, wheezing.  Gastrointestinal: Denies dysphagia, odynophagia, heartburn, reflux, water brash, abdominal pain or cramps, nausea, vomiting, bloating, diarrhea, constipation, hematemesis, melena, hematochezia  or hemorrhoids. Genitourinary: Denies dysuria, frequency, urgency, nocturia, hesitancy, discharge, hematuria or flank pain. Musculoskeletal: Denies arthralgias, myalgias, stiffness, jt. swelling, pain, limping or strain/sprain.  Skin: Denies pruritus, rash, hives, warts, acne, eczema or change in skin lesion(s). Neuro: No weakness, tremor, incoordination, spasms, paresthesia or pain. Psychiatric: Denies confusion, memory loss or sensory loss. Endo: Denies change in weight, skin or hair change.  Heme/Lymph: No excessive bleeding, bruising or enlarged lymph nodes.  Physical Exam  BP 120/70   Pulse (!) 56   Temp 97.9 F (36.6 C)   Resp 16   Ht 5'  4" (1.626 m)   Wt 137 lb 12.8 oz (62.5 kg)   SpO2 98%   BMI 23.65 kg/m   Appears  well nourished, well groomed  and in no distress.  Eyes: PERRLA, EOMs, conjunctiva no swelling or erythema. Sinuses: No frontal/maxillary tenderness ENT/Mouth: EAC's clear, TM's nl w/o erythema, bulging. Nares clear w/o erythema, swelling, exudates. Oropharynx clear without erythema or exudates. Oral hygiene is good. Tongue normal, non obstructing. Hearing intact.  Neck: Supple. Thyroid not palpable. Car 2+/2+ without bruits, nodes or JVD. Chest: Respirations nl with BS clear & equal w/o rales, rhonchi, wheezing or stridor.  Cor: Heart sounds normal w/ regular rate and rhythm without  sig. murmurs, gallops, clicks or rubs. Peripheral pulses normal and equal  without edema.  Abdomen: Soft & bowel sounds normal. Non-tender w/o guarding, rebound, hernias, masses or organomegaly.  Lymphatics: Unremarkable.  Musculoskeletal: Full ROM all peripheral extremities, joint stability, 5/5 strength and normal gait.  Skin: Warm, dry without exposed rashes, lesions or ecchymosis apparent.  Neuro: Cranial nerves intact, reflexes equal bilaterally. Sensory-motor testing grossly intact. Tendon reflexes grossly intact.  Pysch: Alert & oriented x 3.  Insight and judgement nl & appropriate. No ideations.  Assessment and Plan:  1. Labile hypertension  - Continue medication, monitor blood pressure at home.  - Continue DASH diet.  Reminder to go to the ER if any CP,  SOB, nausea, dizziness, severe HA, changes vision/speech. - CBC with Differential/Platelet - COMPLETE METABOLIC PANEL WITH GFR - Magnesium - TSH  2. Hyperlipidemia, mixed  - Continue diet/meds, exercise,& lifestyle modifications.  - Continue monitor periodic cholesterol/liver & renal functions  - Lipid panel - TSH  3. Abnormal glucose  - Continue diet, exercise  - Lifestyle modifications.  - Monitor appropriate labs - Hemoglobin A1c - Insulin,  random  4. Vitamin D deficiency  - Continue supplementation  - VITAMIN D 25 Hydroxy  5. Aortic atherosclerosis ([place]) by LS spine XR 11/2018  - Lipid panel  6. Medication management  - CBC with Differential/Platelet - COMPLETE METABOLIC PANEL WITH GFR - Magnesium - Lipid panel - TSH - Hemoglobin A1c - Insulin, random - VITAMIN D 25 Hydroxy         Discussed  regular exercise, BP monitoring, weight control to achieve/maintain BMI less than 25 and discussed med and SE's. Recommended labs to assess and monitor clinical status with further disposition pending results of labs.  I discussed the assessment and treatment plan with the patient. The patient was provided an opportunity to ask questions and all were answered. The patient agreed with the plan and demonstrated an understanding of the instructions.  I provided over 30 minutes of exam, counseling, chart review and  complex critical decision making.         The patient was advised to call back or seek an in-person evaluation if the symptoms worsen or if the condition fails to improve as anticipated.   Kirtland Bouchard, MD

## 2020-08-12 NOTE — Patient Instructions (Signed)

## 2020-08-13 ENCOUNTER — Ambulatory Visit (INDEPENDENT_AMBULATORY_CARE_PROVIDER_SITE_OTHER): Payer: Medicare HMO | Admitting: Internal Medicine

## 2020-08-13 ENCOUNTER — Other Ambulatory Visit: Payer: Self-pay

## 2020-08-13 VITALS — BP 120/70 | HR 56 | Temp 97.9°F | Resp 16 | Ht 64.0 in | Wt 137.8 lb

## 2020-08-13 DIAGNOSIS — R7309 Other abnormal glucose: Secondary | ICD-10-CM | POA: Diagnosis not present

## 2020-08-13 DIAGNOSIS — Z79899 Other long term (current) drug therapy: Secondary | ICD-10-CM | POA: Diagnosis not present

## 2020-08-13 DIAGNOSIS — E559 Vitamin D deficiency, unspecified: Secondary | ICD-10-CM

## 2020-08-13 DIAGNOSIS — I700 Atherosclerosis of aorta: Secondary | ICD-10-CM

## 2020-08-13 DIAGNOSIS — R0989 Other specified symptoms and signs involving the circulatory and respiratory systems: Secondary | ICD-10-CM | POA: Diagnosis not present

## 2020-08-13 DIAGNOSIS — E782 Mixed hyperlipidemia: Secondary | ICD-10-CM | POA: Diagnosis not present

## 2020-08-14 LAB — CBC WITH DIFFERENTIAL/PLATELET
Absolute Monocytes: 660 cells/uL (ref 200–950)
Basophils Absolute: 47 cells/uL (ref 0–200)
Basophils Relative: 0.5 %
Eosinophils Absolute: 233 cells/uL (ref 15–500)
Eosinophils Relative: 2.5 %
HCT: 40.5 % (ref 35.0–45.0)
Hemoglobin: 13.7 g/dL (ref 11.7–15.5)
Lymphs Abs: 2074 cells/uL (ref 850–3900)
MCH: 30.3 pg (ref 27.0–33.0)
MCHC: 33.8 g/dL (ref 32.0–36.0)
MCV: 89.6 fL (ref 80.0–100.0)
MPV: 10.2 fL (ref 7.5–12.5)
Monocytes Relative: 7.1 %
Neutro Abs: 6287 cells/uL (ref 1500–7800)
Neutrophils Relative %: 67.6 %
Platelets: 250 10*3/uL (ref 140–400)
RBC: 4.52 10*6/uL (ref 3.80–5.10)
RDW: 12.9 % (ref 11.0–15.0)
Total Lymphocyte: 22.3 %
WBC: 9.3 10*3/uL (ref 3.8–10.8)

## 2020-08-14 LAB — COMPLETE METABOLIC PANEL WITH GFR
AG Ratio: 2 (calc) (ref 1.0–2.5)
ALT: 13 U/L (ref 6–29)
AST: 16 U/L (ref 10–35)
Albumin: 4.1 g/dL (ref 3.6–5.1)
Alkaline phosphatase (APISO): 77 U/L (ref 37–153)
BUN: 17 mg/dL (ref 7–25)
CO2: 23 mmol/L (ref 20–32)
Calcium: 9.7 mg/dL (ref 8.6–10.4)
Chloride: 110 mmol/L (ref 98–110)
Creat: 0.84 mg/dL (ref 0.60–0.88)
GFR, Est African American: 69 mL/min/{1.73_m2} (ref >=60)
GFR, Est Non African American: 60 mL/min/{1.73_m2} (ref >=60)
Globulin: 2.1 g/dL (calc) (ref 1.9–3.7)
Glucose, Bld: 86 mg/dL (ref 65–99)
Potassium: 4.2 mmol/L (ref 3.5–5.3)
Sodium: 143 mmol/L (ref 135–146)
Total Bilirubin: 1 mg/dL (ref 0.2–1.2)
Total Protein: 6.2 g/dL (ref 6.1–8.1)

## 2020-08-14 LAB — HEMOGLOBIN A1C
Hgb A1c MFr Bld: 5.2 % of total Hgb (ref <5.7)
Mean Plasma Glucose: 103 mg/dL
eAG (mmol/L): 5.7 mmol/L

## 2020-08-14 LAB — VITAMIN D 25 HYDROXY (VIT D DEFICIENCY, FRACTURES): Vit D, 25-Hydroxy: 103 ng/mL — ABNORMAL HIGH (ref 30–100)

## 2020-08-14 LAB — TSH: TSH: 0.82 mIU/L (ref 0.40–4.50)

## 2020-08-14 LAB — LIPID PANEL
Cholesterol: 169 mg/dL (ref <200)
HDL: 68 mg/dL (ref >=50)
LDL Cholesterol (Calc): 81 mg/dL (calc)
Non-HDL Cholesterol (Calc): 101 mg/dL (calc) (ref <130)
Total CHOL/HDL Ratio: 2.5 (calc) (ref <5.0)
Triglycerides: 100 mg/dL (ref <150)

## 2020-08-14 LAB — MAGNESIUM: Magnesium: 2 mg/dL (ref 1.5–2.5)

## 2020-08-14 LAB — INSULIN, RANDOM: Insulin: 16.2 u[IU]/mL

## 2020-08-14 NOTE — Progress Notes (Signed)
============================================================ ============================================================  -    Total Chol = 169    -    Excellent   - Very low risk for Heart Attack  / Stroke ============================================================ ============================================================  -  A1c is Normal - No Diabetes   - Great ! ============================================================ ============================================================  -  Vitamin D = 103 - Excellent - Please keep dose same  ============================================================ ============================================================  -  All Else - CBC - Kidneys - Electrolytes - Liver - Magnesium & Thyroid    - all  Normal / OK ============================================================ ============================================================  -  Keep up the [place] Work   !  ============================================================ ============================================================

## 2020-08-16 NOTE — Progress Notes (Signed)
Pt was called today to discuss her lab results. Pt states she understood her results. 08/16/20 DG/[facility]

## 2020-09-05 ENCOUNTER — Ambulatory Visit
Admission: RE | Admit: 2020-09-05 | Discharge: 2020-09-05 | Disposition: A | Discharge status: Home / Self Care | Payer: Medicare HMO | Source: Ambulatory Visit | Attending: Internal Medicine | Admitting: Internal Medicine

## 2020-09-05 ENCOUNTER — Other Ambulatory Visit: Payer: Self-pay

## 2020-09-05 DIAGNOSIS — Z1231 Encounter for screening mammogram for malignant neoplasm of breast: Secondary | ICD-10-CM

## 2020-10-21 DIAGNOSIS — M1712 Unilateral primary osteoarthritis, left knee: Secondary | ICD-10-CM | POA: Diagnosis not present

## 2020-10-28 DIAGNOSIS — M1712 Unilateral primary osteoarthritis, left knee: Secondary | ICD-10-CM | POA: Diagnosis not present

## 2020-11-06 DIAGNOSIS — M1712 Unilateral primary osteoarthritis, left knee: Secondary | ICD-10-CM | POA: Diagnosis not present

## 2020-12-03 ENCOUNTER — Ambulatory Visit (INDEPENDENT_AMBULATORY_CARE_PROVIDER_SITE_OTHER): Payer: Medicare HMO

## 2020-12-03 ENCOUNTER — Other Ambulatory Visit: Payer: Self-pay

## 2020-12-03 VITALS — Temp 97.9°F

## 2020-12-03 DIAGNOSIS — Z23 Encounter for immunization: Secondary | ICD-10-CM

## 2020-12-03 NOTE — Progress Notes (Signed)
Patient presents today for a Nurse visit for a flu shot. Given in LEFT arm and tolerated well.

## 2020-12-05 DIAGNOSIS — M25562 Pain in left knee: Secondary | ICD-10-CM | POA: Diagnosis not present

## 2020-12-05 DIAGNOSIS — M25572 Pain in left ankle and joints of left foot: Secondary | ICD-10-CM | POA: Diagnosis not present

## 2020-12-23 ENCOUNTER — Other Ambulatory Visit: Payer: Self-pay | Admitting: Internal Medicine

## 2020-12-23 MED ORDER — MELOXICAM 15 MG PO TABS: ORAL_TABLET | ORAL | 0 refills | Status: DC

## 2020-12-25 DIAGNOSIS — E785 Hyperlipidemia, unspecified: Secondary | ICD-10-CM | POA: Diagnosis not present

## 2020-12-25 DIAGNOSIS — J309 Allergic rhinitis, unspecified: Secondary | ICD-10-CM | POA: Diagnosis not present

## 2020-12-25 DIAGNOSIS — M1990 Unspecified osteoarthritis, unspecified site: Secondary | ICD-10-CM | POA: Diagnosis not present

## 2020-12-25 DIAGNOSIS — R69 Illness, unspecified: Secondary | ICD-10-CM | POA: Diagnosis not present

## 2020-12-25 DIAGNOSIS — I700 Atherosclerosis of aorta: Secondary | ICD-10-CM | POA: Diagnosis not present

## 2020-12-25 DIAGNOSIS — E039 Hypothyroidism, unspecified: Secondary | ICD-10-CM | POA: Diagnosis not present

## 2020-12-25 DIAGNOSIS — M8580 Other specified disorders of bone density and structure, unspecified site: Secondary | ICD-10-CM | POA: Diagnosis not present

## 2020-12-25 DIAGNOSIS — I739 Peripheral vascular disease, unspecified: Secondary | ICD-10-CM | POA: Diagnosis not present

## 2020-12-25 DIAGNOSIS — I10 Essential (primary) hypertension: Secondary | ICD-10-CM | POA: Diagnosis not present

## 2021-01-03 ENCOUNTER — Ambulatory Visit: Payer: Medicare HMO | Admitting: Internal Medicine

## 2021-01-05 NOTE — Progress Notes (Signed)
Future Appointments  Date Time Provider [place]  01/06/2021 11:30 AM Unk Pinto, MD [facility] None  02/18/2021 11:00 AM Unk Pinto, MD [facility] None  06/17/2021 11:00 AM Liane Comber, NP [facility] None    History of Present Illness:                       This very nice 87 y.o. [name] with HTN, HLD, Hypothyroidism, Prediabetes, IBS-D and Vitamin D Deficiency presents with c/o HA, dizziness "Swimmy headedness" exacerbated with positional changes. She describes a quasi type of vertigo. She relates that she has had some falls with minor injuries ~ scrapes of her legs.    Medications    levothyroxine (SYNTHROID) 150 MCG tablet, Take 1/2-1 tablet daily    furosemide (LASIX) 20 MG tablet, Take 1 tab once daily in the morning as needed for leg swelling. Hold medication if any low blood pressures. (Patient not taking: Reported on 08/13/2020)   simvastatin (ZOCOR) 20 MG tablet, Take one tablet at bedtime for Cholesterol.    aspirin (ASPIRIN 81) 81 MG EC tablet, Take 1 tablet daily   meloxicam (MOBIC) 15 MG tablet, Take 1/2 to 1 tablet Daily with Food for Pain & Inflammation & try limit to 5 days /week to avoid Kidney damage     Cholecalciferol (VITAMIN D PO), Take 5,000 Units by mouth 2 (two) times daily.   clotrimazole-betamethasone (LOTRISONE) cream, Apply 1 application topically 2 (two) times daily. (Patient taking differently: Apply 1 application topically as needed.)   diazepam (VALIUM) 5 MG tablet, Take 1/2-1 tablet 2 - 3 x /day ONLY if needed for Anxiety Attack &  limit to 5 days /week to avoid addiction   escitalopram (LEXAPRO) 5 MG tablet, Take 1 tablet daily for Anxiety & Depression (Patient not taking: Reported on 08/13/2020)   Probiotic Product (PROBIOTIC PO), Take 1 tablet by mouth daily.  Problem list She has Hypothyroidism; Internal hemorrhoids; Osteoarthritis; PERSONAL HISTORY OF MALIGNANT NEOPLASM OF BREAST; Diverticulosis; Colon polyps; Rhinitis,  allergic; Labile hypertension; Hyperlipidemia, mixed; Vitamin D deficiency; Insomnia; Generalized anxiety disorder; PMR (polymyalgia rheumatica) (HCC); CKD (chronic kidney disease) stage 3, GFR 30-59 ml/min ([place]); Major depression in remission [facility]); and Aortic atherosclerosis ([place]) by LS spine XR 11/2018 on their problem list.   Observations/Objective:  BP (!) 148/78   Pulse 60   Temp 97.9 F (36.6 C)   Resp 16   Ht 5\' 4"  (1.626 m)   Wt 137 lb 3.2 oz (62.2 kg)   SpO2 96%   BMI 23.55 kg/m    Postural       Sitting BP 147/72    P 63              &           Standing    BP 144/76     P 63   HEENT - WNL.  TM's  NL  Neck - supple.  Chest - Clear equal BS. Cor - Nl HS. RRR w/o sig MGR. PP 1(+). No edema. MS- FROM w/o deformities.  Gait Nl. Neuro -  Nl w/o focal abnormalities.  Assessment and Plan:   1. Vertigo  - Ambulatory referral to ENT   Follow Up Instructions:        I discussed the assessment and treatment plan with the patient. The patient was provided an opportunity to ask questions and all were answered. The patient agreed with the plan and [place],  MD

## 2021-01-06 ENCOUNTER — Other Ambulatory Visit: Payer: Self-pay

## 2021-01-06 ENCOUNTER — Encounter: Payer: Self-pay | Admitting: Internal Medicine

## 2021-01-06 ENCOUNTER — Ambulatory Visit (INDEPENDENT_AMBULATORY_CARE_PROVIDER_SITE_OTHER): Payer: Medicare HMO | Admitting: Internal Medicine

## 2021-01-06 VITALS — BP 148/78 | HR 60 | Temp 97.9°F | Resp 16 | Ht 64.0 in | Wt 137.2 lb

## 2021-01-06 DIAGNOSIS — R42 Dizziness and giddiness: Secondary | ICD-10-CM | POA: Diagnosis not present

## 2021-01-26 IMAGING — CR DG THORACIC SPINE 3V
3 series · 3 of 3 positions shown · non-contrast
Comparison: Chest radiographs dated 03/24/2018.

CLINICAL DATA: Diffuse fine and pelvis pain, increasing. No known
injury.

EXAM:
THORACIC SPINE - 3 VIEWS

[t t-spine a.p.]
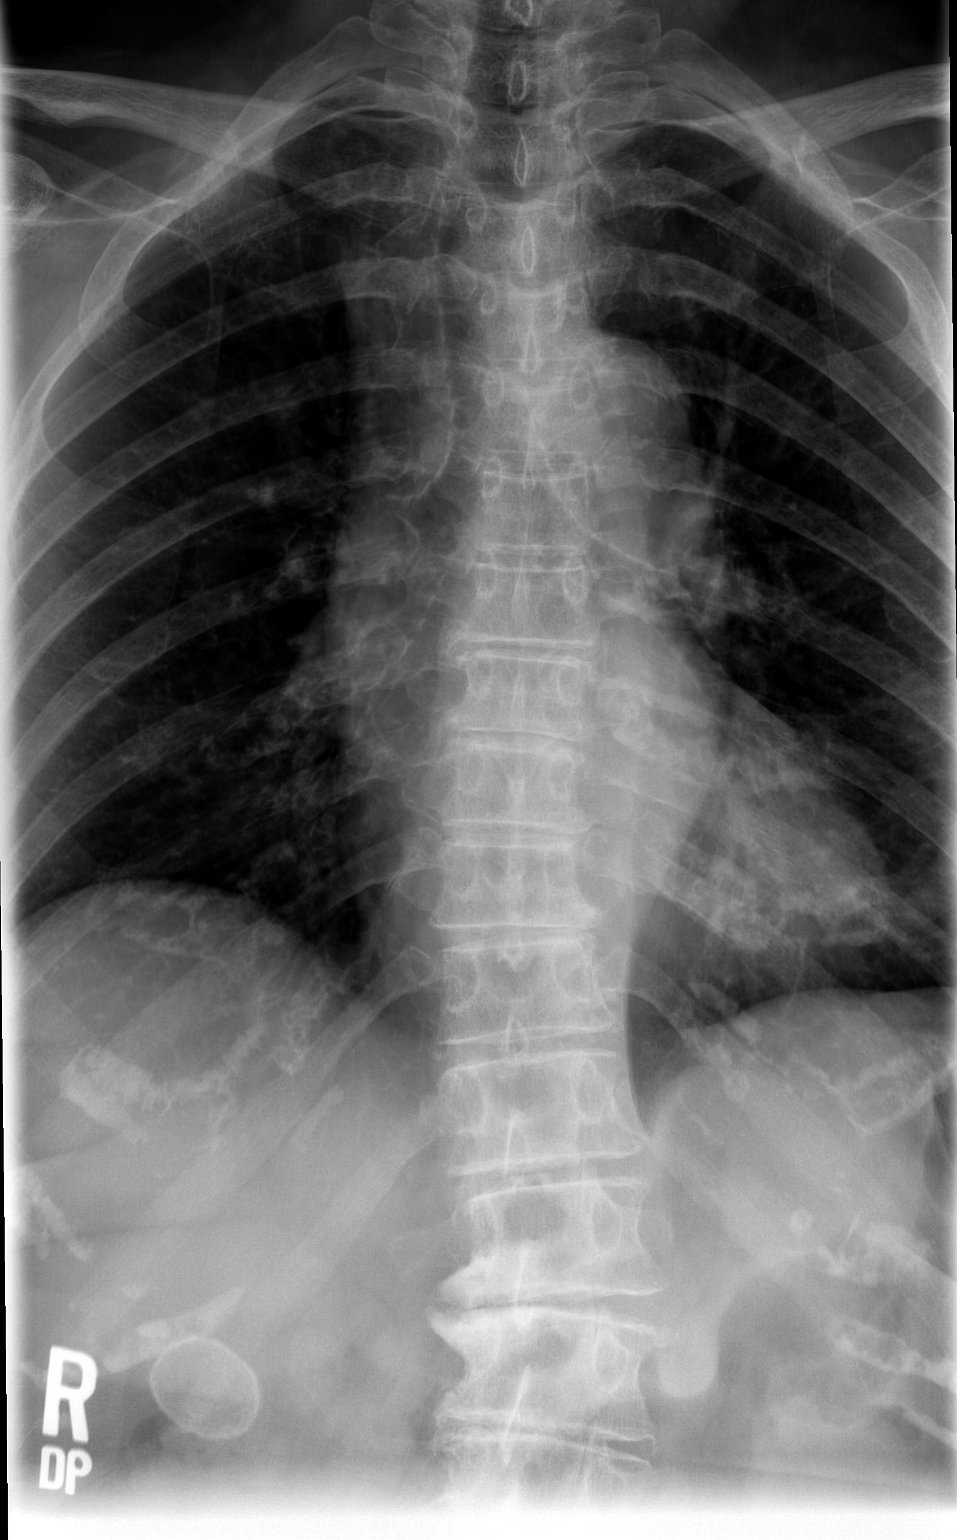

[t t-spine lat *]
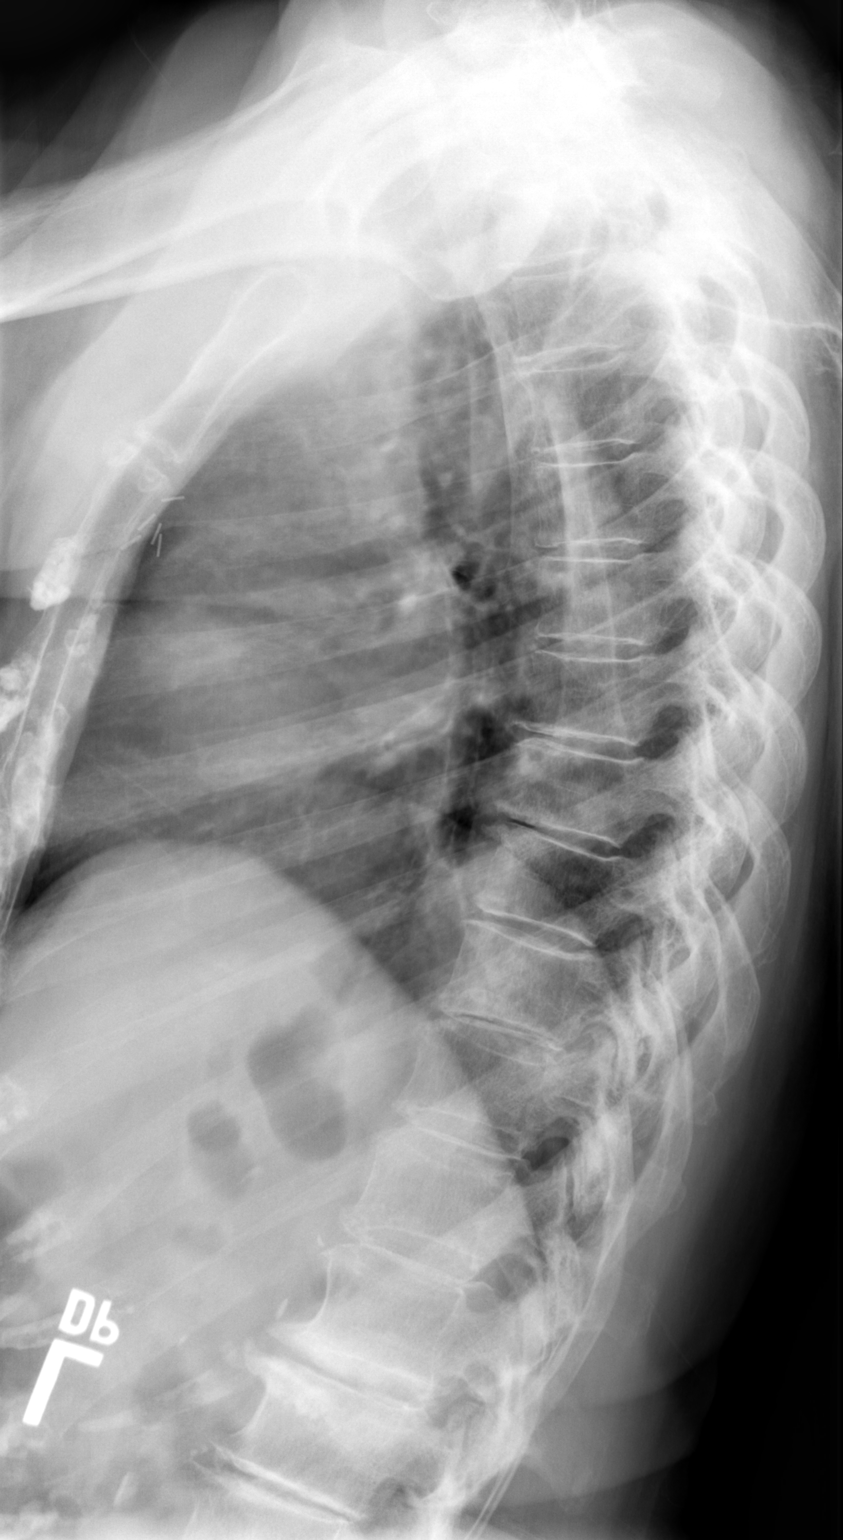

[t swimmers]
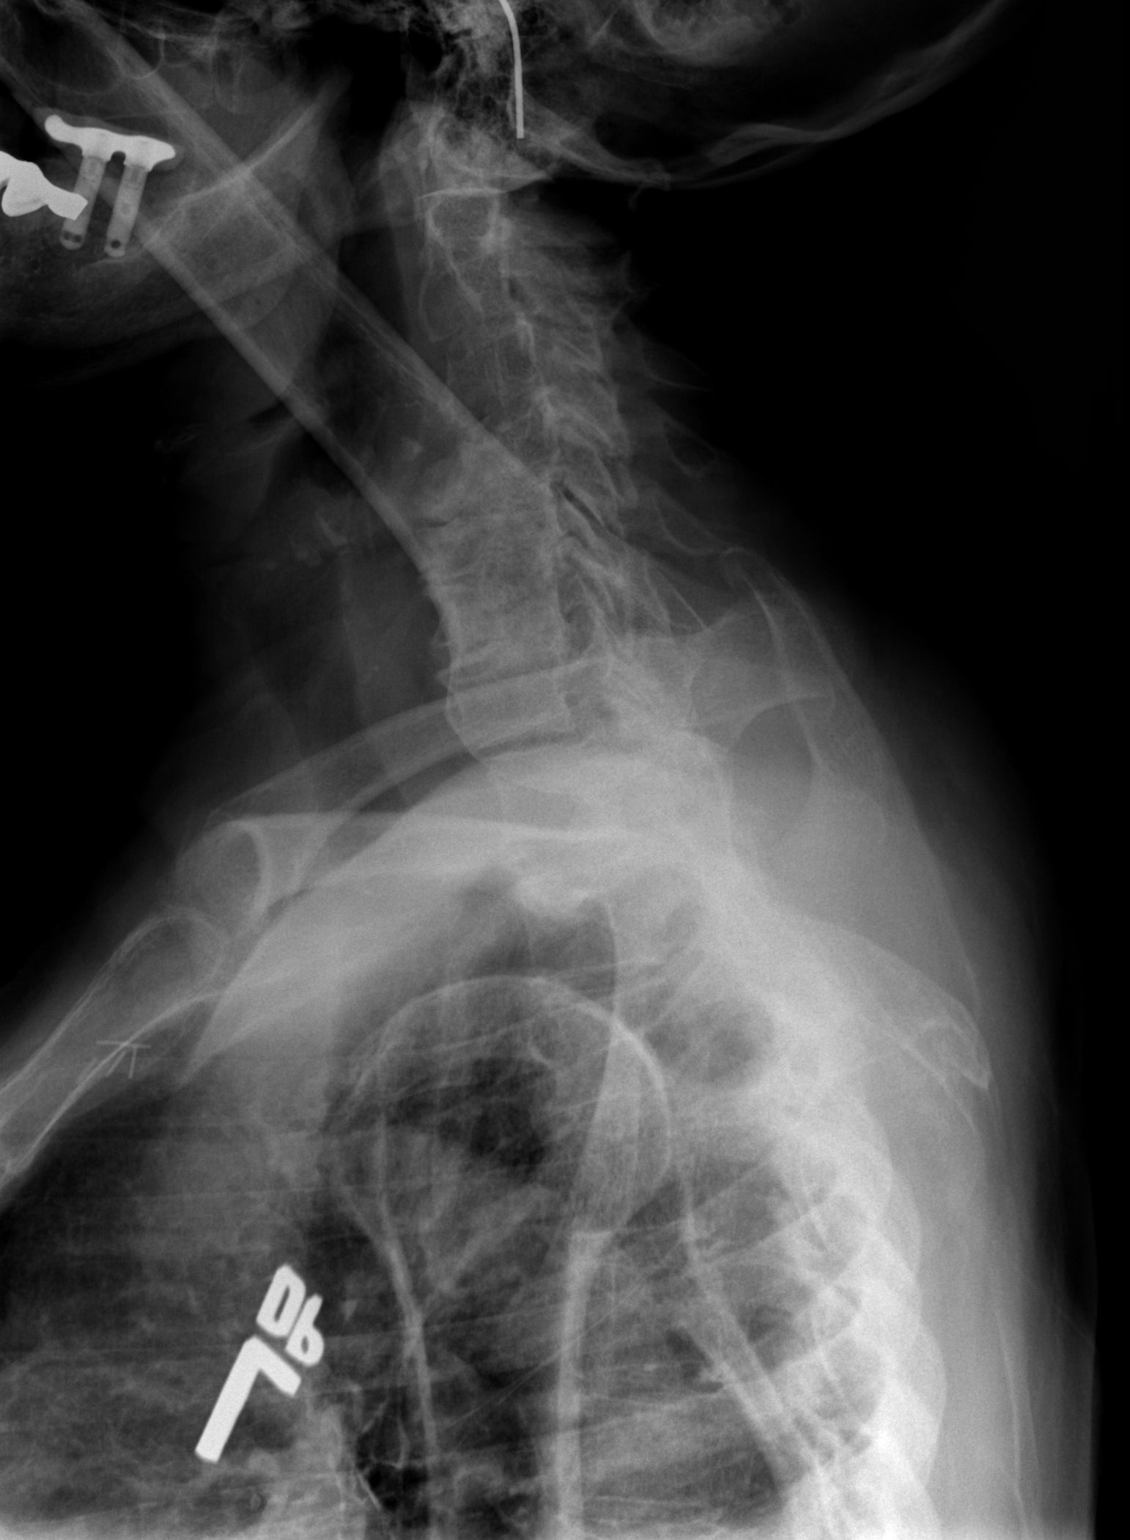

[3 of 3 positions shown; findings below may reference images not displayed]

FINDINGS: Mild cervicothoracic and thoracolumbar scoliosis. Mild anterior spur
formation at multiple levels. Cervical and lumbar spine degenerative
changes. No fractures or subluxations. Previously demonstrated left
upper chest surgical clips anteriorly. Previously described large
gallstone in the gallbladder.
IMPRESSION: 1. Multilevel degenerative changes. No acute abnormality.
2. Previously described large gallstone in the gallbladder.

## 2021-01-26 IMAGING — CR DG SI JOINTS 3+V
3 series · 3 of 3 positions shown · non-contrast
Comparison: None.

CLINICAL DATA: Pelvic pain. No known injury.

EXAM:
BILATERAL SACROILIAC JOINTS - 3+ VIEW

[t sacroiliac joints (1 of 3)]
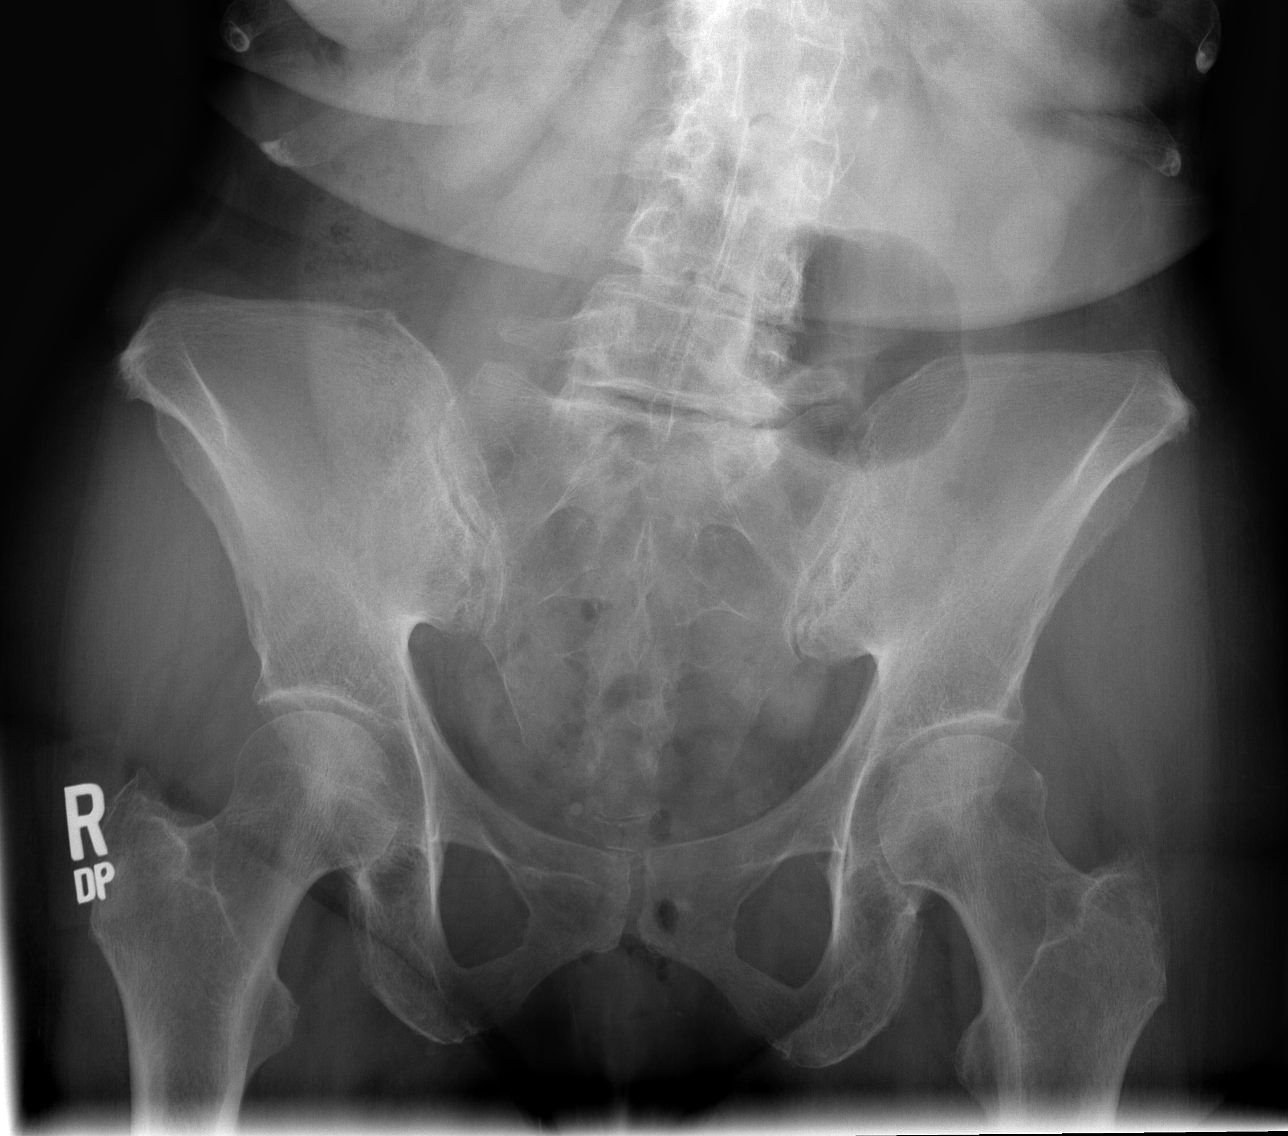

[t sacroiliac joints (2 of 3)]
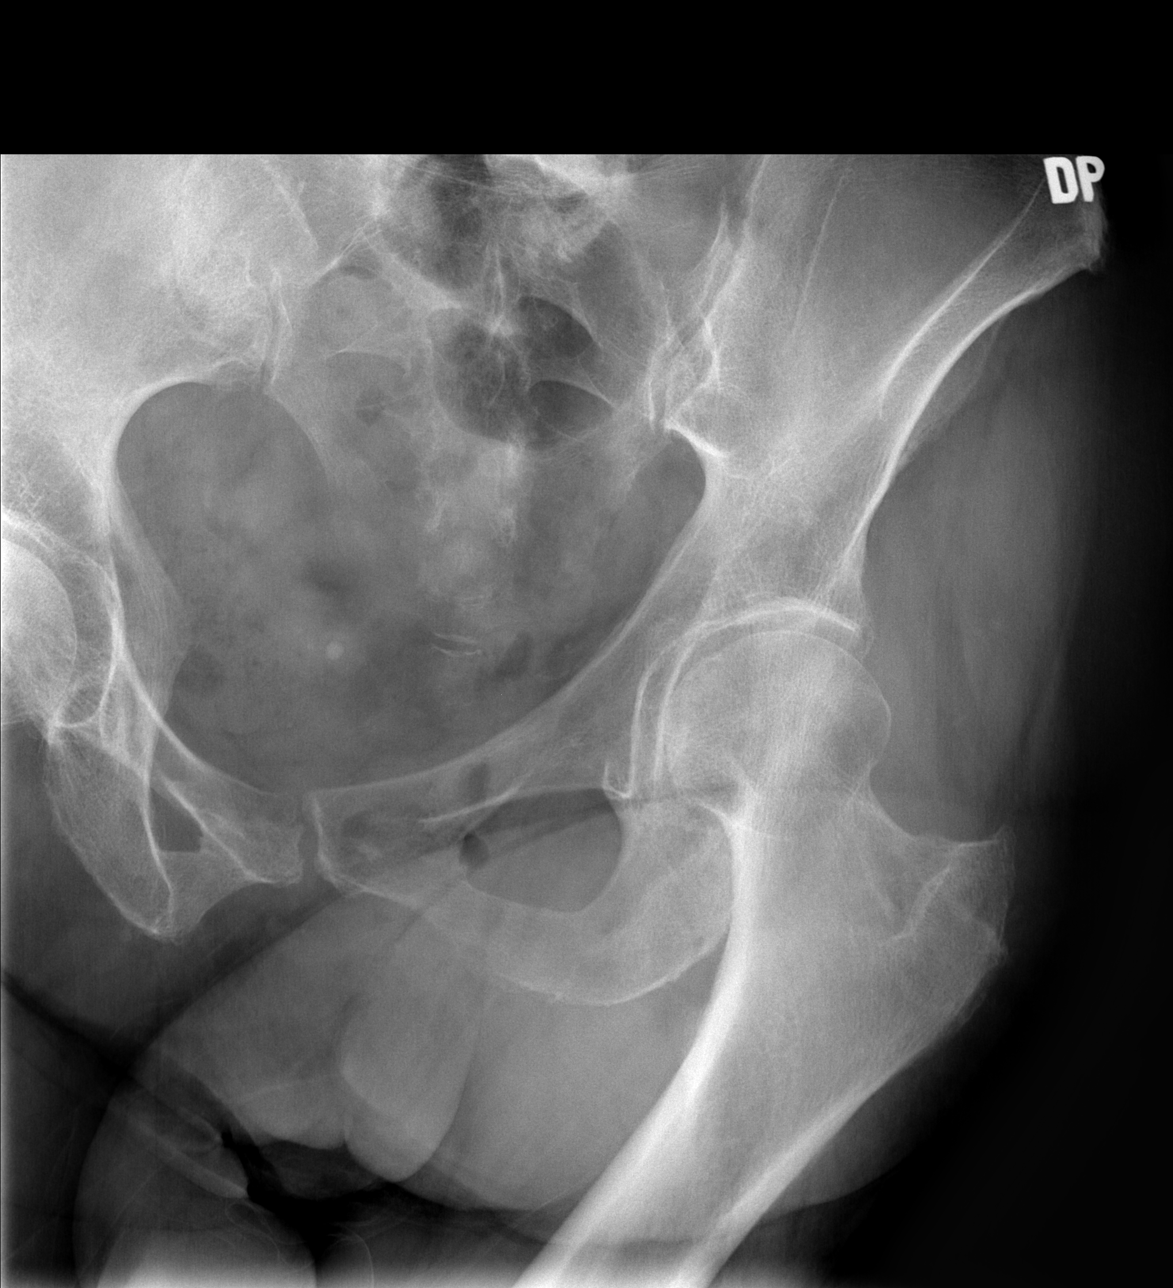

[t sacroiliac joints (3 of 3)]
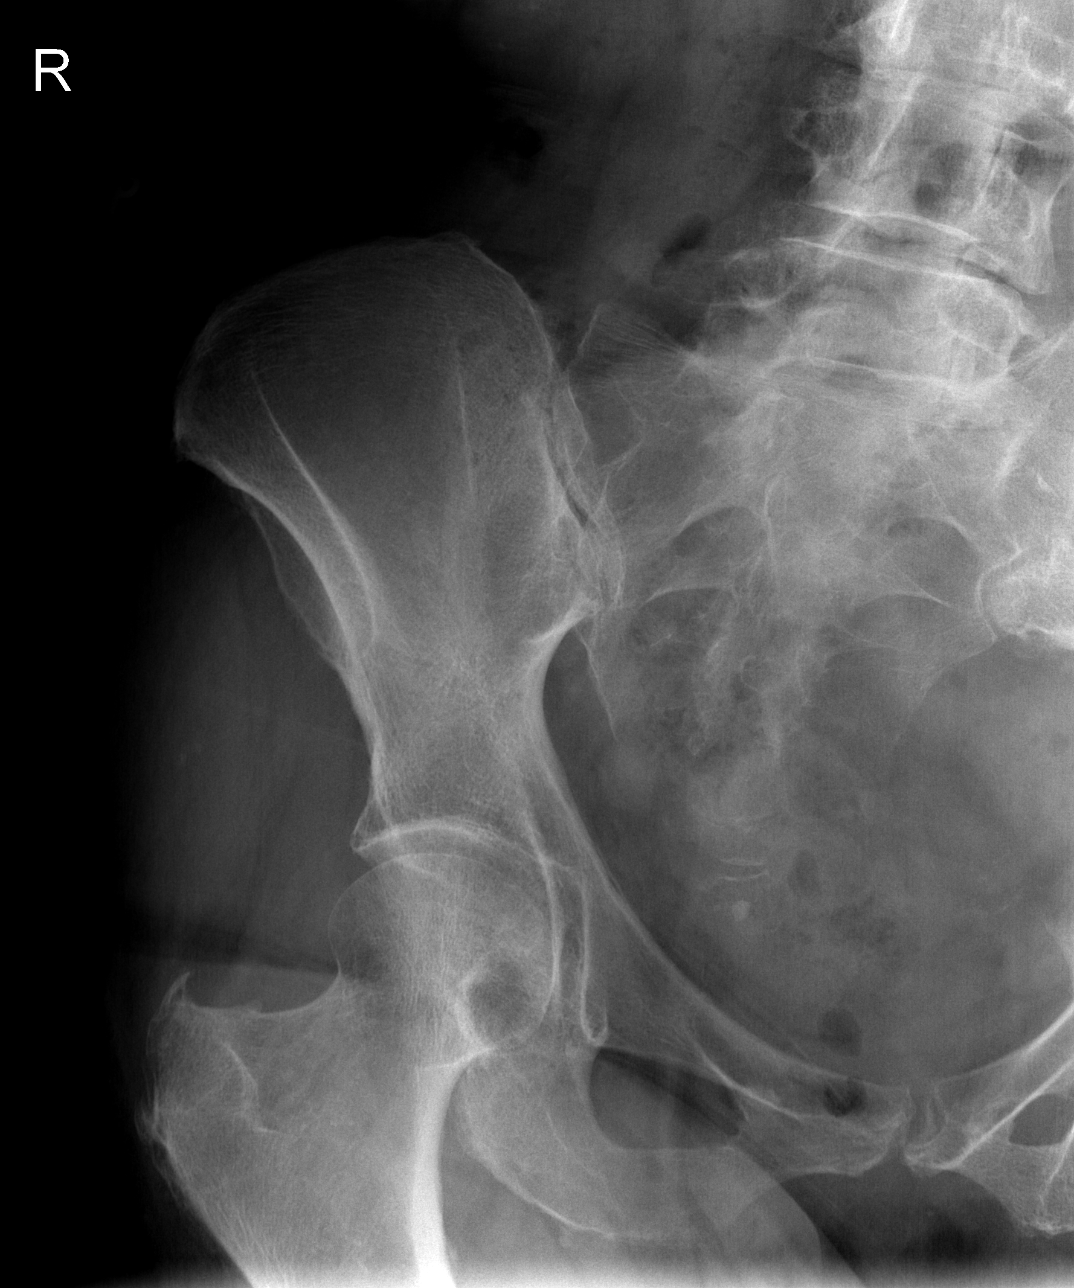

[3 of 3 positions shown; findings below may reference images not displayed]

FINDINGS: The sacroiliac joint spaces are maintained and there is no evidence
of arthropathy. No other acute bone abnormalities of the pelvic
bones are seen.

Multilevel osteoarthritic changes of the lumbosacral spine.
IMPRESSION: Negative.

## 2021-01-26 IMAGING — CR DG LUMBAR SPINE COMPLETE 4+V
5 series · 5 of 5 positions shown · non-contrast
Comparison: None.

CLINICAL DATA: Diffuse spine and pelvis pain, increasing. No known
injury.

EXAM:
LUMBAR SPINE - COMPLETE 4+ VIEW

[t l-spine a.p.]
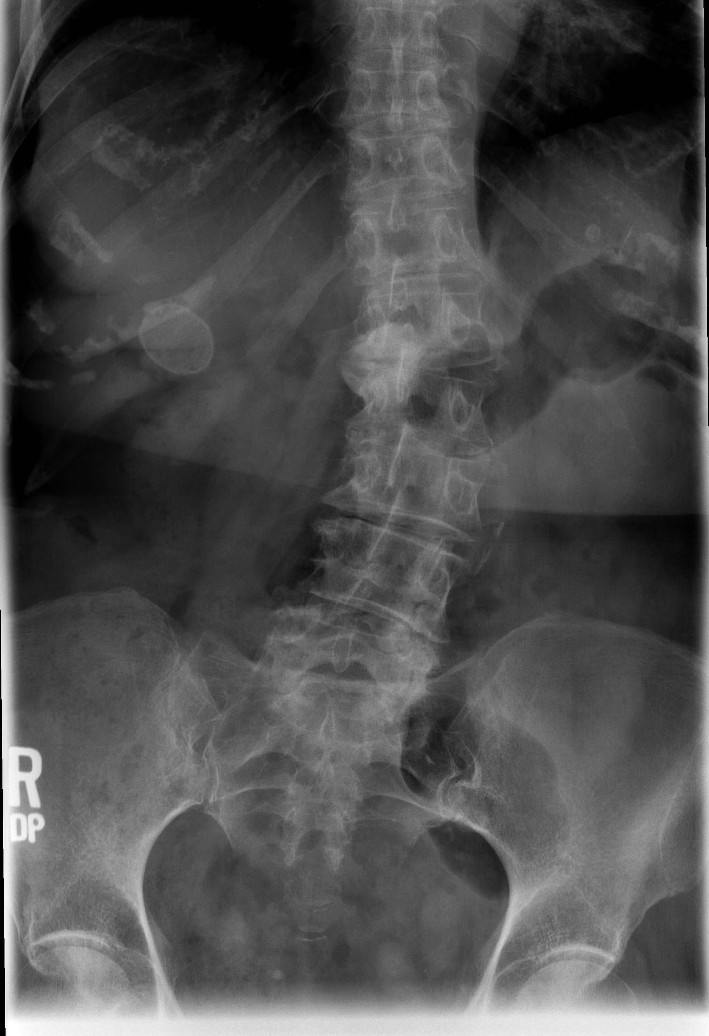

[t l-spine oblique exposure (1 of 2)]
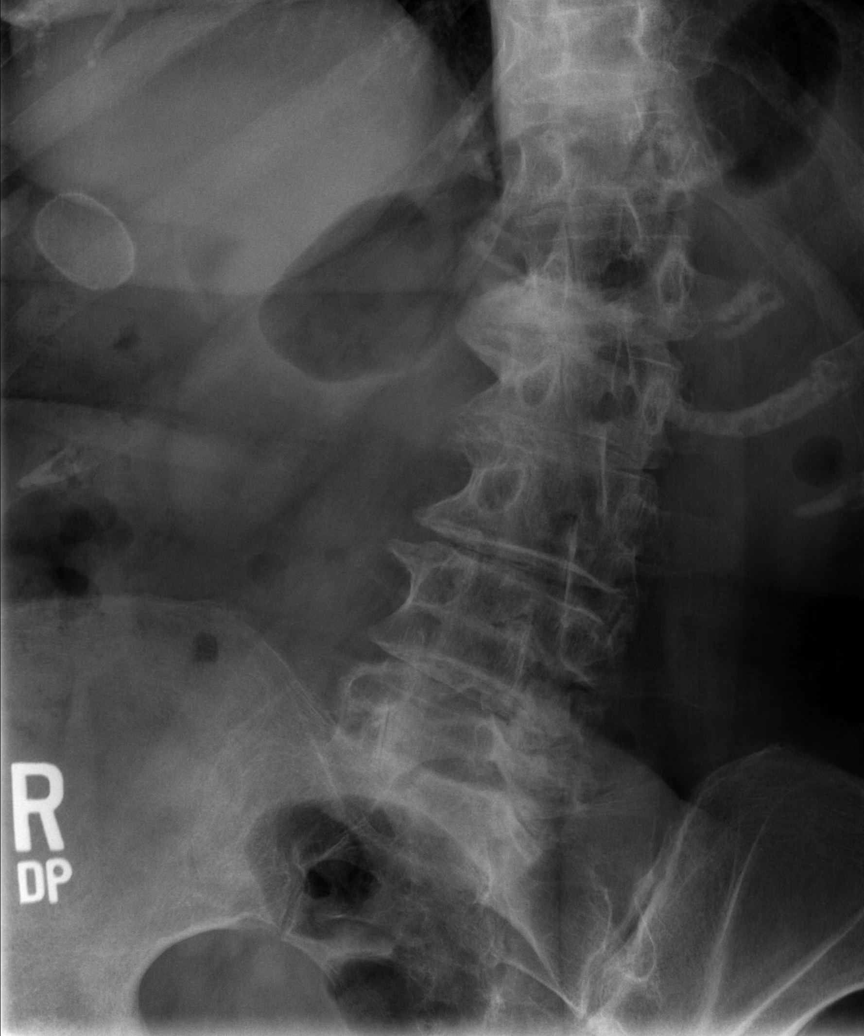

[t l-spine oblique exposure (2 of 2)]
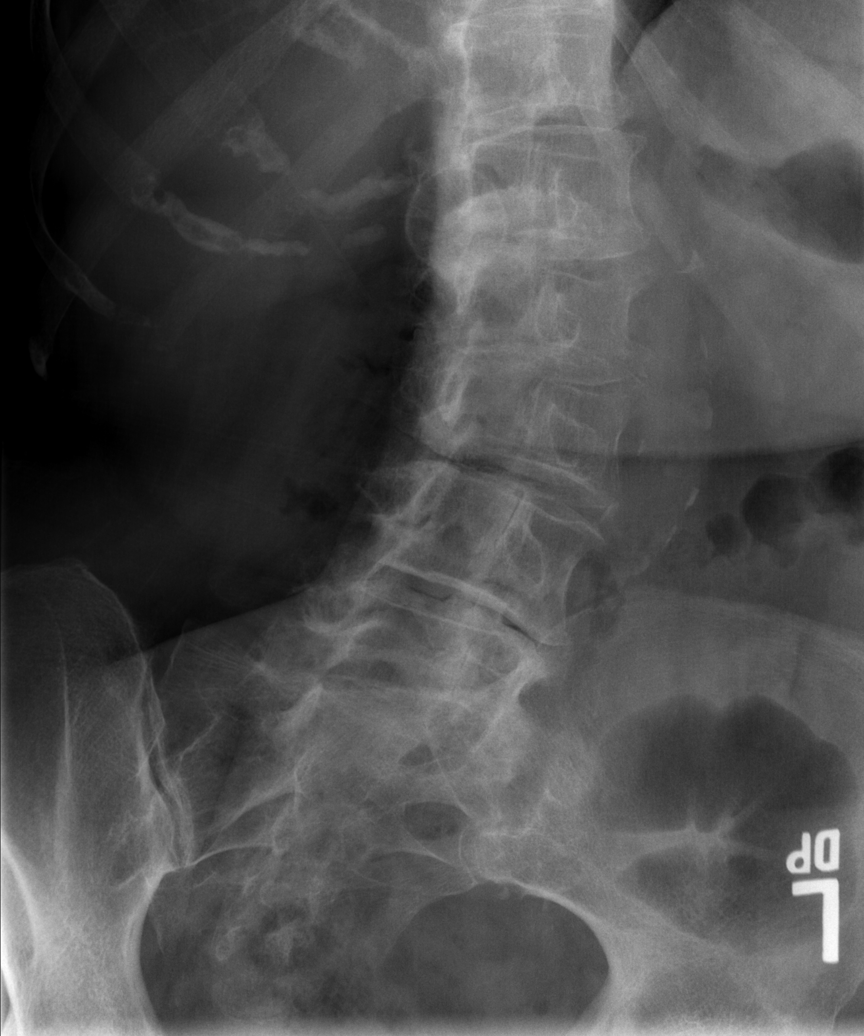

[t l-spine lat]
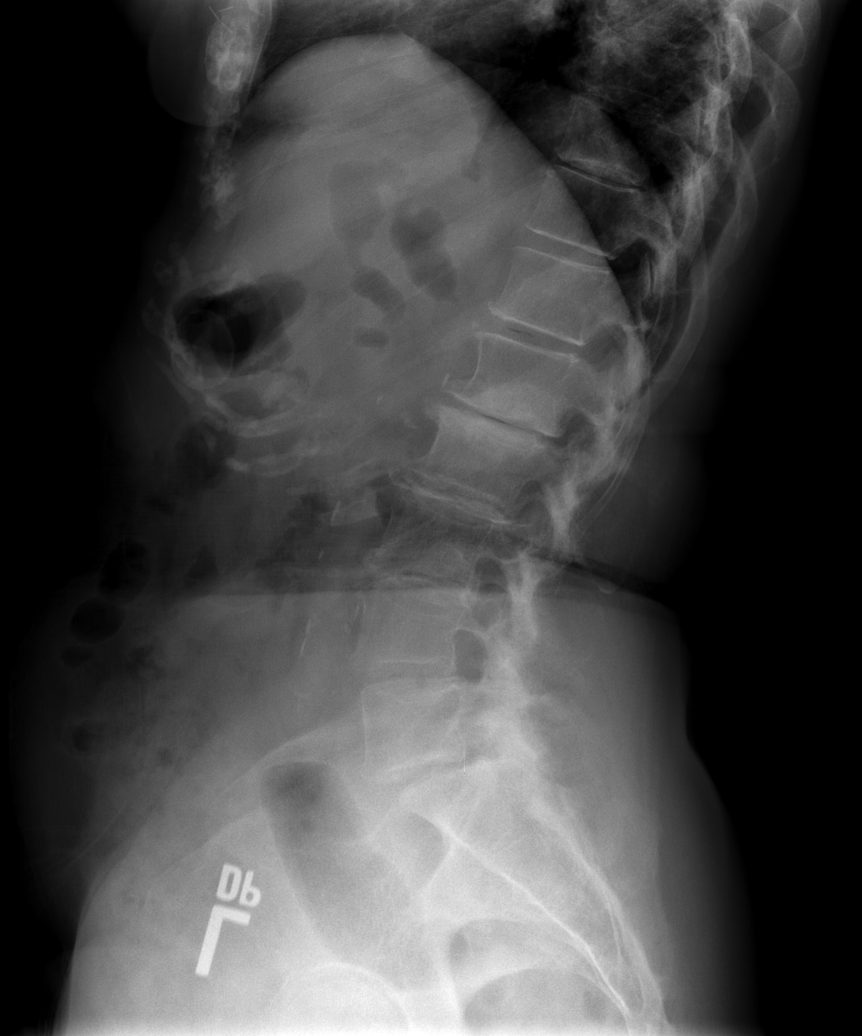

[t l-spine l5-s1 spot]
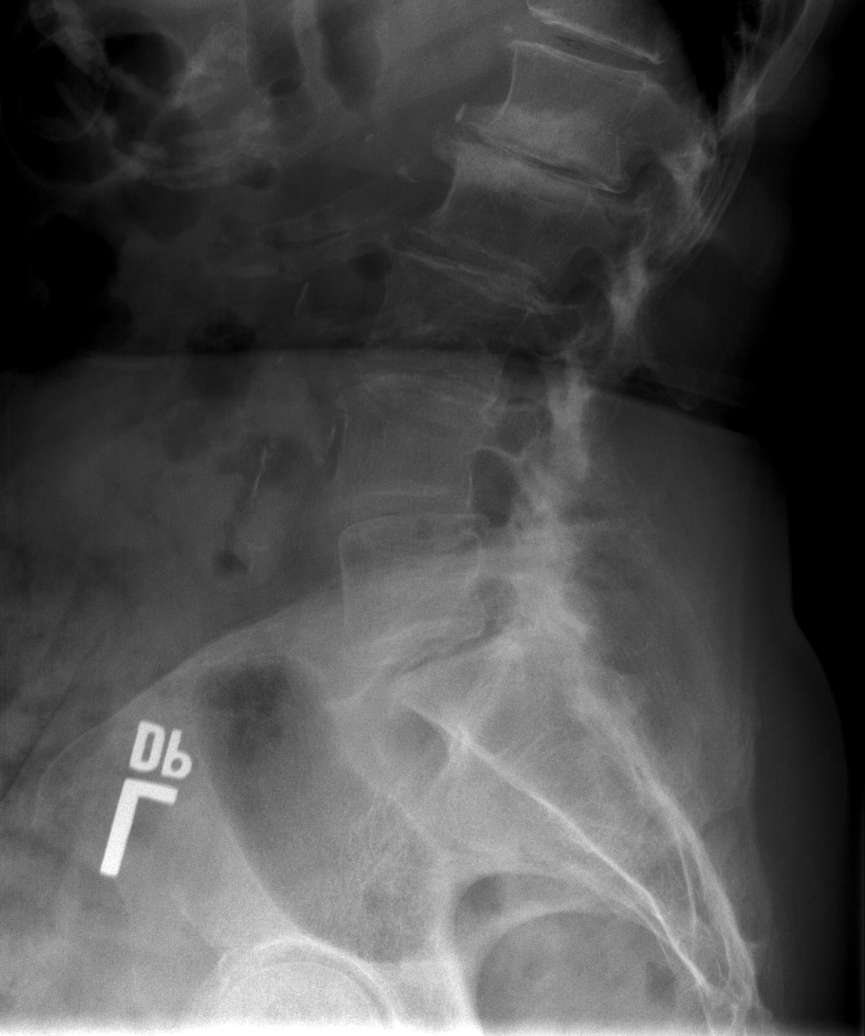

[5 of 5 positions shown; findings below may reference images not displayed]

FINDINGS: Five non-rib-bearing lumbar vertebrae. Mild to moderate levoconvex
lumbar rotary scoliosis. Mild and moderate anterior and lateral spur
formation at multiple levels. These are largest on the right at the
L1-2 level with marked discogenic sclerosis. Mild retrolisthesis at
the L1-2 and L2-3 levels. No fractures or pars defects. Facet
degenerative changes in the lower lumbar spine. Atheromatous
arterial calcifications without aneurysm. 3.3 cm gallstone in the
right upper abdomen.
IMPRESSION: 1. Multilevel degenerative changes and scoliosis, as described
above.
2. 3.3 cm gallstone in the right upper abdomen.

## 2021-02-03 ENCOUNTER — Other Ambulatory Visit: Payer: Self-pay | Admitting: Internal Medicine

## 2021-02-03 MED ORDER — MIRTAZAPINE 15 MG PO TABS: ORAL_TABLET | ORAL | 1 refills | Status: DC

## 2021-02-12 DIAGNOSIS — H9113 Presbycusis, bilateral: Secondary | ICD-10-CM | POA: Insufficient documentation

## 2021-02-12 DIAGNOSIS — R42 Dizziness and giddiness: Secondary | ICD-10-CM | POA: Diagnosis not present

## 2021-02-17 DIAGNOSIS — M1711 Unilateral primary osteoarthritis, right knee: Secondary | ICD-10-CM | POA: Diagnosis not present

## 2021-02-17 DIAGNOSIS — M25562 Pain in left knee: Secondary | ICD-10-CM | POA: Diagnosis not present

## 2021-02-18 ENCOUNTER — Encounter: Payer: Medicare HMO | Admitting: Internal Medicine

## 2021-02-20 ENCOUNTER — Other Ambulatory Visit: Payer: Self-pay | Admitting: Adult Health

## 2021-02-20 ENCOUNTER — Other Ambulatory Visit: Payer: Self-pay | Admitting: Internal Medicine

## 2021-02-20 DIAGNOSIS — E039 Hypothyroidism, unspecified: Secondary | ICD-10-CM

## 2021-02-20 MED ORDER — LEVOTHYROXINE SODIUM 150 MCG PO TABS: ORAL_TABLET | ORAL | 3 refills | Status: DC

## 2021-02-26 ENCOUNTER — Encounter: Payer: Self-pay | Admitting: Internal Medicine

## 2021-02-26 NOTE — Patient Instructions (Signed)

## 2021-02-26 NOTE — Progress Notes (Addendum)
Annual Screening/Preventative Visit & Comprehensive Evaluation &  Examination  Future Appointments  Date Time Provider Department  02/27/2021  2:00 PM Unk Pinto, MD [facility]  06/17/2021 11:00 AM Liane Comber, NP [facility]  03/05/2022  2:00 PM Unk Pinto, MD [facility]        This very nice 87 y.o. [name]  presents for a Screening /Preventative Visit & comprehensive evaluation and management of multiple medical co-morbidities.  Patient has been followed for HTN, HLD, Prediabetes,  Hypothyroidism,  IBS-D and Vitamin D Deficiency.    LS spine XR in Sept 2020  showed  Aortic atherosclerosis .                                          Patient also has hx/o PMR  (Oct 2020) a treated with steroids thru Oct 2021  by Dr Estanislado Pandy.          Patient has been followed expectantly for mild labile HTN .   She does have CKD3a attributed to her HTN Patient's BP has been controlled at home and patient denies any cardiac symptoms as chest pain, palpitations, shortness of breath, dizziness or ankle swelling. Today's BP is at goal -  138/80.        Patient's hyperlipidemia is controlled with diet & Simvastatin. Patient denies myalgias or other medication SE's. Last lipids were at goal :     Lab Results  Component Value Date   CHOL 169 08/13/2020   HDL 68 08/13/2020   LDLCALC 81 08/13/2020   TRIG 100 08/13/2020   CHOLHDL 2.5 08/13/2020         Patient has also been followed proactively for glucose intolerance  and patient denies reactive hypoglycemic symptoms, visual blurring, diabetic polys or paresthesias. Last A1c was normal & at goal :  Lab Results  Component Value Date   HGBA1C 5.2 08/13/2020                                             Patient was dx'd Hypothyroid in 2000 & has been on thyroid replacement since then .         Finally, patient has history of Vitamin D Deficiency  ("37" /2011) and last Vitamin D was borderline high goal range :  Lab Results  Component Value  Date   VD25OH 103 (H) 08/13/2020     Current Outpatient Medications on File Prior to Visit  Medication Sig   ASPIRIN 81  EC  Take 1 tablet daily   VITAMIN D   5,000 Units Take   2  times daily.   LOTRISONE  cream  Apply  topically as needed.)   diazepam  5 MG tablet Take 1/2-1 tablet 2 - 3 x /day ONLY if needed for Anxiety    levothyroxine  150 MCG tablet Take 1/2-1 tablet daily    mirtazapine 15 MG tablet Take  1 tablet  1 hour  before  Bedtime for Appetite & Sleep   Probiotic  Take 1 tablet  daily.   simvastatin 20 MG tablet Take one tablet at bedtime for Cholesterol.    Allergies  Allergen Reactions   Ciprofloxacin    Epinephrine Other (See Comments)    Heart raced    Etodolac Hives   Synephrine  Palpitations    Flagyl [Metronidazole Hcl] Rash     Past Medical History:  Diagnosis Date   Anxiety    Breast cancer ([place])    left   Colon polyp 2009   BENIGN POLYPOID   Diverticulosis of colon (without mention of hemorrhage) 2009   DJD (degenerative joint disease)    Family history of malignant neoplasm of gastrointestinal tract 05/06/2011   HOH (hard of hearing)    Right ear   Hypercholesteremia    Hypothyroidism    IBS (irritable bowel syndrome)    Internal hemorrhoids without mention of complication    Primary osteoarthritis of right knee 11/26/2015   S/p TKA   Sinus drainage    Vitiligo    Wears dentures    top   Wears glasses      Health Maintenance  Topic Date Due   Zoster Vaccines- Shingrix (1 of 2) Never done   COVID-19 Vaccine (3 - Pfizer risk series) 07/04/2019   TETANUS/TDAP  01/11/2030   Pneumonia Vaccine 14+ Years old  Completed   INFLUENZA VACCINE  Completed   DEXA SCAN  Completed   HPV VACCINES  Aged Out     Immunization History  Administered Date(s) Administered   Influenza Split 12/01/2010   Influenza, High Dose Seasonal PF 11/12/2015, 11/23/2016, 12/01/2017, 12/06/2018, 12/12/2019, 12/03/2020   Influenza-Unspecified 10/31/2012,  11/29/2014   PFIZER(Purple Top)SARS-COV-2 Vaccination 05/13/2019, 06/06/2019   Pneumococcal Conjugate-13 10/18/2013   Pneumococcal Polysaccharide-23 03/20/2010   Td 10/17/2012, 01/12/2020   Last Colon -  2009 - No f/u due to age   Last MGM - 09/05/2020  Past Surgical History:  Procedure Laterality Date   ABDOMINAL HYSTERECTOMY     bladder tac     BREAST LUMPECTOMY  2004   left lump-snbx   BUNIONECTOMY     bilateral   COLONOSCOPY  2009   several   CYSTOCELE REPAIR  2009   EYE SURGERY Bilateral    Cataract removal   KNEE ARTHROSCOPY Left 01/17/2013   Procedure: ARTHROSCOPY LEFT KNEE;  Surgeon: Hessie Dibble, MD;  Location: [place];  Service: Orthopedics;  Laterality: Left;  partial medial and partial lateral chondroplasty and removal loose body   right shoulder  2003   bone spur removed-rcr   TONSILLECTOMY     TOTAL KNEE ARTHROPLASTY Right 11/26/2015   Procedure: TOTAL KNEE ARTHROPLASTY;  Surgeon: Melrose Nakayama, MD;  Location: [facility];  Service: Orthopedics;  Laterality: Right;   WRIST FRACTURE SURGERY  2003   left-lipoma     Family History  Problem Relation Age of Onset   Alzheimer's disease Sister    Cancer Sister 76       breast   Breast cancer Sister    Cancer Sister 67       breast cancer   Colon cancer Son    Heart disease Mother        died at age 77   Alzheimer's disease Mother    Testicular cancer Father     Social History   Tobacco Use   Smoking status: Never   Smokeless tobacco: Never  Vaping Use   Vaping Use: Never used  Substance Use Topics   Alcohol use: Yes    Comment: ocassionally   Drug use: No      ROS Constitutional: Denies fever, chills, weight loss/gain, headaches, insomnia,  night sweats, and change in appetite. Does c/o fatigue. Eyes: Denies redness, blurred vision, diplopia, discharge, itchy, watery eyes.  ENT: Denies discharge,  congestion, post nasal drip, epistaxis, sore throat, earache, hearing loss,  dental pain, Tinnitus, Vertigo, Sinus pain, snoring.  Cardio: Denies chest pain, palpitations, irregular heartbeat, syncope, dyspnea, diaphoresis, orthopnea, PND, claudication, edema Respiratory: denies cough, dyspnea, DOE, pleurisy, hoarseness, laryngitis, wheezing.  Gastrointestinal: Denies dysphagia, heartburn, reflux, water brash, pain, cramps, nausea, vomiting, bloating, diarrhea, constipation, hematemesis, melena, hematochezia, jaundice, hemorrhoids Genitourinary: Denies dysuria, frequency, urgency, nocturia, hesitancy, discharge, hematuria, flank pain Breast: Breast lumps, nipple discharge, bleeding.  Musculoskeletal: Denies arthralgia, myalgia, stiffness, Jt. Swelling, pain, limp, and strain/sprain. Denies falls. Skin: Denies puritis, rash, hives, warts, acne, eczema, changing in skin lesion Neuro: No weakness, tremor, incoordination, spasms, paresthesia, pain Psychiatric: Denies confusion, memory loss, sensory loss. Denies Depression. Endocrine: Denies change in weight, skin, hair change, nocturia, and paresthesia, diabetic polys, visual blurring, hyper / hypo glycemic episodes.  Heme/Lymph: No excessive bleeding, bruising, enlarged lymph nodes.  Physical Exam  BP 138/80    Pulse 66    Temp 97.9 F (36.6 C)    Resp 16    Ht 5\' 4"  (1.626 m)    Wt 136 lb 9.6 oz (62 kg)    SpO2 96%    BMI 23.45 kg/m   General Appearance: Well nourished, well groomed and in no apparent distress.  Eyes: PERRLA, EOMs, conjunctiva no swelling or erythema, normal fundi and vessels. Sinuses: No frontal/maxillary tenderness ENT/Mouth: EACs patent / TMs  nl. Nares clear without erythema, swelling, mucoid exudates. Oral hygiene is good. No erythema, swelling, or exudate. Tongue normal, non-obstructing. Tonsils not swollen or erythematous. Hearing normal.  Neck: Supple, thyroid not palpable. No bruits, nodes or JVD. Respiratory: Respiratory effort normal.  BS equal and clear bilateral without rales, rhonci,  wheezing or stridor. Cardio: Heart sounds are normal with regular rate and rhythm and no murmurs, rubs or gallops. Peripheral pulses are normal and equal bilaterally without edema. No aortic or femoral bruits. Chest: symmetric with normal excursions and percussion. Breasts: Symmetric, without lumps, nipple discharge, retractions, or fibrocystic changes.  Abdomen: Flat, soft with bowel sounds active. Nontender, no guarding, rebound, hernias, masses, or organomegaly.  Lymphatics: Non tender without lymphadenopathy.  Genitourinary:  Musculoskeletal: Full ROM all peripheral extremities, joint stability, 5/5 strength, and normal gait. Skin: Warm and dry without rashes, lesions, cyanosis, clubbing or  ecchymosis.  Neuro: Cranial nerves intact, reflexes equal bilaterally. Normal muscle tone, no cerebellar symptoms. Sensation intact.  Pysch: Alert and oriented X 3, normal affect, Insight and Judgment appropriate.    Assessment and Plan  1. Annual Preventative Screening Examination  2. Labile hypertension  - EKG 12-Lead - Urinalysis, Routine w reflex microscopic - Microalbumin / creatinine urine ratio - CBC with Differential/Platelet - COMPLETE METABOLIC PANEL WITH GFR - Magnesium - TSH  3. Hyperlipidemia, mixed  - EKG 12-Lead - Lipid panel - TSH  4. Abnormal glucose  - EKG 12-Lead - Hemoglobin A1c - Insulin, random  5. Vitamin D deficiency  - VITAMIN D 25 Hydroxy   6. Hypothyroidism  - TSH  7. Aortic atherosclerosis ([place]) by LS spine XR 11/2018  - EKG 12-Lead - Lipid panel  8. Stage 3a chronic kidney disease (HCC)  - Urinalysis, Routine w reflex microscopic - Microalbumin / creatinine urine ratio - PTH, intact and calcium - COMPLETE METABOLIC PANEL WITH GFR  9. PMR (polymyalgia rheumatica) (HCC)  - Sedimentation rate - C-reactive protein  10. Personal history of malignant neoplasm of breast   11. Screening for colorectal cancer  - POC Hemoccult Bld/Stl    12. Screening  for ischemic heart disease  - EKG 12-Lead  13. FHx: heart disease  - EKG 12-Lead  14. Medication management  - Urinalysis, Routine w reflex microscopic - Microalbumin / creatinine urine ratio - CBC with Differential/Platelet - COMPLETE METABOLIC PANEL WITH GFR - Magnesium - Lipid panel - TSH - Hemoglobin A1c - Insulin, random - VITAMIN D 25 Hydroxy  - Sedimentation rate - C-reactive protein        Patient was counseled in prudent diet to achieve/maintain BMI less than 25 for weight control, BP monitoring, regular exercise and medications. Discussed med's effects and SE's. Screening labs and tests as requested with regular follow-up as recommended. Over 40 minutes of exam, counseling, chart review and high complex critical decision making was performed.   Kirtland Bouchard, MD

## 2021-02-27 ENCOUNTER — Ambulatory Visit (INDEPENDENT_AMBULATORY_CARE_PROVIDER_SITE_OTHER): Payer: Medicare HMO | Admitting: Internal Medicine

## 2021-02-27 ENCOUNTER — Encounter: Payer: Self-pay | Admitting: Internal Medicine

## 2021-02-27 ENCOUNTER — Other Ambulatory Visit: Payer: Self-pay

## 2021-02-27 VITALS — BP 138/80 | HR 66 | Temp 97.9°F | Resp 16 | Ht 64.0 in | Wt 136.6 lb

## 2021-02-27 DIAGNOSIS — M353 Polymyalgia rheumatica: Secondary | ICD-10-CM | POA: Diagnosis not present

## 2021-02-27 DIAGNOSIS — Z136 Encounter for screening for cardiovascular disorders: Secondary | ICD-10-CM

## 2021-02-27 DIAGNOSIS — I700 Atherosclerosis of aorta: Secondary | ICD-10-CM | POA: Diagnosis not present

## 2021-02-27 DIAGNOSIS — N1831 Chronic kidney disease, stage 3a: Secondary | ICD-10-CM | POA: Diagnosis not present

## 2021-02-27 DIAGNOSIS — Z1211 Encounter for screening for malignant neoplasm of colon: Secondary | ICD-10-CM

## 2021-02-27 DIAGNOSIS — R7309 Other abnormal glucose: Secondary | ICD-10-CM

## 2021-02-27 DIAGNOSIS — R0989 Other specified symptoms and signs involving the circulatory and respiratory systems: Secondary | ICD-10-CM | POA: Diagnosis not present

## 2021-02-27 DIAGNOSIS — Z853 Personal history of malignant neoplasm of breast: Secondary | ICD-10-CM

## 2021-02-27 DIAGNOSIS — E039 Hypothyroidism, unspecified: Secondary | ICD-10-CM | POA: Diagnosis not present

## 2021-02-27 DIAGNOSIS — Z0001 Encounter for general adult medical examination with abnormal findings: Secondary | ICD-10-CM

## 2021-02-27 DIAGNOSIS — Z79899 Other long term (current) drug therapy: Secondary | ICD-10-CM

## 2021-02-27 DIAGNOSIS — E782 Mixed hyperlipidemia: Secondary | ICD-10-CM | POA: Diagnosis not present

## 2021-02-27 DIAGNOSIS — Z8249 Family history of ischemic heart disease and other diseases of the circulatory system: Secondary | ICD-10-CM | POA: Diagnosis not present

## 2021-02-27 DIAGNOSIS — E559 Vitamin D deficiency, unspecified: Secondary | ICD-10-CM

## 2021-02-27 DIAGNOSIS — Z0000 Encounter for general adult medical examination without abnormal findings: Secondary | ICD-10-CM

## 2021-02-27 MED ORDER — GABAPENTIN 300 MG PO CAPS: ORAL_CAPSULE | ORAL | 0 refills | Status: DC

## 2021-02-28 LAB — CBC WITH DIFFERENTIAL/PLATELET
Absolute Monocytes: 585 cells/uL (ref 200–950)
Basophils Absolute: 47 cells/uL (ref 0–200)
Basophils Relative: 0.6 %
Eosinophils Absolute: 78 cells/uL (ref 15–500)
Eosinophils Relative: 1 %
HCT: 38.5 % (ref 35.0–45.0)
Hemoglobin: 13.2 g/dL (ref 11.7–15.5)
Lymphs Abs: 2246 cells/uL (ref 850–3900)
MCH: 30.5 pg (ref 27.0–33.0)
MCHC: 34.3 g/dL (ref 32.0–36.0)
MCV: 88.9 fL (ref 80.0–100.0)
MPV: 10 fL (ref 7.5–12.5)
Monocytes Relative: 7.5 %
Neutro Abs: 4844 cells/uL (ref 1500–7800)
Neutrophils Relative %: 62.1 %
Platelets: 285 10*3/uL (ref 140–400)
RBC: 4.33 10*6/uL (ref 3.80–5.10)
RDW: 12.6 % (ref 11.0–15.0)
Total Lymphocyte: 28.8 %
WBC: 7.8 10*3/uL (ref 3.8–10.8)

## 2021-02-28 LAB — URINALYSIS, ROUTINE W REFLEX MICROSCOPIC
Bilirubin Urine: NEGATIVE
Glucose, UA: NEGATIVE
Hgb urine dipstick: NEGATIVE
Ketones, ur: NEGATIVE
Leukocytes,Ua: NEGATIVE
Nitrite: NEGATIVE
Protein, ur: NEGATIVE
Specific Gravity, Urine: 1.009 (ref 1.001–1.035)
pH: 5.5 (ref 5.0–8.0)

## 2021-02-28 LAB — COMPLETE METABOLIC PANEL WITH GFR
AG Ratio: 1.9 (calc) (ref 1.0–2.5)
ALT: 11 U/L (ref 6–29)
AST: 16 U/L (ref 10–35)
Albumin: 4.2 g/dL (ref 3.6–5.1)
Alkaline phosphatase (APISO): 77 U/L (ref 37–153)
BUN: 16 mg/dL (ref 7–25)
CO2: 26 mmol/L (ref 20–32)
Calcium: 10.3 mg/dL (ref 8.6–10.4)
Chloride: 106 mmol/L (ref 98–110)
Creat: 0.82 mg/dL (ref 0.60–0.95)
Globulin: 2.2 g/dL (calc) (ref 1.9–3.7)
Glucose, Bld: 88 mg/dL (ref 65–139)
Potassium: 4 mmol/L (ref 3.5–5.3)
Sodium: 141 mmol/L (ref 135–146)
Total Bilirubin: 1.1 mg/dL (ref 0.2–1.2)
Total Protein: 6.4 g/dL (ref 6.1–8.1)
eGFR: 67 mL/min/{1.73_m2} (ref >=60)

## 2021-02-28 LAB — LIPID PANEL
Cholesterol: 183 mg/dL (ref <200)
HDL: 70 mg/dL (ref >=50)
LDL Cholesterol (Calc): 92 mg/dL (calc)
Non-HDL Cholesterol (Calc): 113 mg/dL (calc) (ref <130)
Total CHOL/HDL Ratio: 2.6 (calc) (ref <5.0)
Triglycerides: 113 mg/dL (ref <150)

## 2021-02-28 LAB — MICROALBUMIN / CREATININE URINE RATIO
Creatinine, Urine: 50 mg/dL (ref 20–275)
Microalb Creat Ratio: 6 mcg/mg creat (ref <30)
Microalb, Ur: 0.3 mg/dL

## 2021-02-28 LAB — VITAMIN D 25 HYDROXY (VIT D DEFICIENCY, FRACTURES): Vit D, 25-Hydroxy: 114 ng/mL — ABNORMAL HIGH (ref 30–100)

## 2021-02-28 LAB — SEDIMENTATION RATE: Sed Rate: 2 mm/h (ref 0–30)

## 2021-02-28 LAB — PTH, INTACT AND CALCIUM
Calcium: 10.3 mg/dL (ref 8.6–10.4)
PTH: 42 pg/mL (ref 16–77)

## 2021-02-28 LAB — C-REACTIVE PROTEIN: CRP: 1.2 mg/L (ref <8.0)

## 2021-02-28 LAB — MAGNESIUM: Magnesium: 2.2 mg/dL (ref 1.5–2.5)

## 2021-02-28 LAB — INSULIN, RANDOM: Insulin: 4.7 u[IU]/mL

## 2021-02-28 LAB — HEMOGLOBIN A1C
Hgb A1c MFr Bld: 5.4 % of total Hgb (ref <5.7)
Mean Plasma Glucose: 108 mg/dL
eAG (mmol/L): 6 mmol/L

## 2021-02-28 LAB — TSH: TSH: 1.17 mIU/L (ref 0.40–4.50)

## 2021-03-01 NOTE — Progress Notes (Signed)
============================================================ °============================================================ ° °-    Sed Rate & CRP are both normal & Low   - So No PMR or Temporal Arteritis like a year ago - Great  ! ============================================================ ============================================================  -  PTH - hormone that regulates calcium balance is Normal & OK  ============================================================ ============================================================  -  Total Chol = 183   -  Excellent   - Very low risk for Heart Attack  / Stroke ============================================================ ============================================================  -  Vitamin D  is borderline high Normal    - Recc cut down from 2 capsules of Vit D to 1 capsule  /day  ============================================================ ============================================================  -  All Else - CBC - Kidneys - Electrolytes - Liver - Magnesium & Thyroid    - all  Normal / OK ============================================================ ============================================================

## 2021-03-04 ENCOUNTER — Other Ambulatory Visit: Payer: Self-pay | Admitting: Internal Medicine

## 2021-03-04 DIAGNOSIS — R2681 Unsteadiness on feet: Secondary | ICD-10-CM

## 2021-03-04 DIAGNOSIS — Z9181 History of falling: Secondary | ICD-10-CM

## 2021-03-10 ENCOUNTER — Other Ambulatory Visit: Payer: Self-pay

## 2021-03-10 DIAGNOSIS — Z1212 Encounter for screening for malignant neoplasm of rectum: Secondary | ICD-10-CM

## 2021-03-10 DIAGNOSIS — Z1211 Encounter for screening for malignant neoplasm of colon: Secondary | ICD-10-CM | POA: Diagnosis not present

## 2021-03-10 LAB — POC HEMOCCULT BLD/STL (HOME/3-CARD/SCREEN)
Card #2 Fecal Occult Blod, POC: NEGATIVE
Card #3 Fecal Occult Blood, POC: NEGATIVE
Fecal Occult Blood, POC: NEGATIVE

## 2021-03-21 ENCOUNTER — Encounter: Payer: Self-pay | Admitting: Internal Medicine

## 2021-03-24 ENCOUNTER — Other Ambulatory Visit: Payer: Self-pay | Admitting: Internal Medicine

## 2021-03-25 DIAGNOSIS — H532 Diplopia: Secondary | ICD-10-CM | POA: Diagnosis not present

## 2021-03-25 DIAGNOSIS — D3132 Benign neoplasm of left choroid: Secondary | ICD-10-CM | POA: Diagnosis not present

## 2021-03-25 DIAGNOSIS — H0102A Squamous blepharitis right eye, upper and lower eyelids: Secondary | ICD-10-CM | POA: Diagnosis not present

## 2021-03-25 DIAGNOSIS — H5051 Esophoria: Secondary | ICD-10-CM | POA: Diagnosis not present

## 2021-03-25 DIAGNOSIS — H26492 Other secondary cataract, left eye: Secondary | ICD-10-CM | POA: Diagnosis not present

## 2021-03-25 DIAGNOSIS — D3131 Benign neoplasm of right choroid: Secondary | ICD-10-CM | POA: Diagnosis not present

## 2021-03-25 DIAGNOSIS — H0102B Squamous blepharitis left eye, upper and lower eyelids: Secondary | ICD-10-CM | POA: Diagnosis not present

## 2021-03-25 DIAGNOSIS — H43393 Other vitreous opacities, bilateral: Secondary | ICD-10-CM | POA: Diagnosis not present

## 2021-03-25 DIAGNOSIS — H04123 Dry eye syndrome of bilateral lacrimal glands: Secondary | ICD-10-CM | POA: Diagnosis not present

## 2021-03-25 LAB — HM DIABETES EYE EXAM

## 2021-03-31 DIAGNOSIS — M79662 Pain in left lower leg: Secondary | ICD-10-CM | POA: Diagnosis not present

## 2021-03-31 DIAGNOSIS — M1712 Unilateral primary osteoarthritis, left knee: Secondary | ICD-10-CM | POA: Diagnosis not present

## 2021-04-09 ENCOUNTER — Encounter: Payer: Self-pay | Admitting: Internal Medicine

## 2021-04-22 ENCOUNTER — Other Ambulatory Visit: Payer: Self-pay | Admitting: Internal Medicine

## 2021-04-22 DIAGNOSIS — F411 Generalized anxiety disorder: Secondary | ICD-10-CM

## 2021-04-22 MED ORDER — DIAZEPAM 5 MG PO TABS: ORAL_TABLET | ORAL | 0 refills | Status: DC

## 2021-04-25 ENCOUNTER — Other Ambulatory Visit: Payer: Self-pay

## 2021-04-25 ENCOUNTER — Ambulatory Visit: Payer: Medicare HMO | Admitting: Nurse Practitioner

## 2021-04-25 ENCOUNTER — Encounter: Payer: Self-pay | Admitting: Nurse Practitioner

## 2021-04-25 DIAGNOSIS — U071 COVID-19: Secondary | ICD-10-CM | POA: Diagnosis not present

## 2021-04-25 MED ORDER — DEXAMETHASONE 1 MG PO TABS: ORAL_TABLET | ORAL | 0 refills | Status: DC

## 2021-04-25 NOTE — Progress Notes (Signed)
THIS ENCOUNTER IS A VIRTUAL VISIT DUE TO COVID-19 - PATIENT WAS NOT SEEN IN THE OFFICE.  PATIENT HAS CONSENTED TO VIRTUAL VISIT / TELEMEDICINE VISIT   Virtual Visit via telephone Note  I connected with  Alexandria Davies on 04/25/2021 by telephone.  I verified that I am speaking with the correct person using two identifiers.    I discussed the limitations of evaluation and management by telemedicine and the availability of in person appointments. The patient expressed understanding and agreed to proceed.  History of Present Illness:  There were no vitals taken for this visit. 88 y.o. patient contacted office reporting URI sx . she tested positive by home test. OV was conducted by telephone to minimize exposure. This patient was vaccinated for covid 19, last 2021 Immunization History  Administered Date(s) Administered   Influenza Split 12/01/2010   Influenza, High Dose Seasonal PF 11/12/2015, 11/23/2016, 12/01/2017, 12/06/2018, 12/12/2019, 12/03/2020   Influenza-Unspecified 10/31/2012, 11/29/2014   PFIZER(Purple Top)SARS-COV-2 Vaccination 05/13/2019, 06/06/2019   Pneumococcal Conjugate-13 10/18/2013   Pneumococcal Polysaccharide-23 03/20/2010   Td 10/17/2012, 01/12/2020    Sx began 3 days ago with fever/chills, sinus congestion  Treatments tried so far: Advil and Tylenol  Exposures: Unknown   Medications  Current Outpatient Medications (Endocrine & Metabolic):    levothyroxine (SYNTHROID) 150 MCG tablet, TAKE 1/2 TO 1 TABLET DAILY AS DIRECTED ON AN EMPTY STOMACH WITH ONLY WATER FOR 30 MINUTES AND NO ANTACID MEDICATION, CALCIUM OR MAGNESIUM FOR 4 HOURS AND AVOID BIOTIN  Current Outpatient Medications (Cardiovascular):    simvastatin (ZOCOR) 20 MG tablet, Take one tablet at bedtime for Cholesterol.   Current Outpatient Medications (Analgesics):    aspirin (ASPIRIN 81) 81 MG EC tablet, Take 1 tablet daily   Current Outpatient Medications (Other):    Cholecalciferol (VITAMIN D  PO), Take 5,000 Units by mouth 2 (two) times daily.   clotrimazole-betamethasone (LOTRISONE) cream, Apply 1 application topically 2 (two) times daily. (Patient taking differently: Apply 1 application topically as needed.)   gabapentin (NEURONTIN) 300 MG capsule, Take  1 capsule  3 x /day  for Neuropathy pain   mirtazapine (REMERON) 15 MG tablet, Take  1 tablet  1 hour  before  Bedtime for Appetite & Sleep   Probiotic Product (PROBIOTIC PO), Take 1 tablet by mouth daily.   diazepam (VALIUM) 5 MG tablet, Take 1/2-1 tablet 2 - 3 x /day ONLY if needed for Anxiety Attack &  limit to 5 days /week to avoid addiction (Patient not taking: Reported on 04/25/2021)  Allergies:  Allergies  Allergen Reactions   Ciprofloxacin    Epinephrine Other (See Comments)    Heart raced    Etodolac Hives   Synephrine     Palpitations    Flagyl [Metronidazole Hcl] Rash    Problem list She has Hypothyroidism; Internal hemorrhoids; Osteoarthritis; PERSONAL HISTORY OF MALIGNANT NEOPLASM OF BREAST; Diverticulosis; Colon polyps; Rhinitis, allergic; Labile hypertension; Hyperlipidemia, mixed; Vitamin D deficiency; Insomnia; Generalized anxiety disorder; PMR (polymyalgia rheumatica) (HCC); CKD (chronic kidney disease) stage 3, GFR 30-59 ml/min ([place]); Major depression in remission [facility]); and Aortic atherosclerosis ([place]) by LS spine XR 11/2018 on their problem list.   Social History:   reports that she has never smoked. She has never used smokeless tobacco. She reports current alcohol use. She reports that she does not use drugs.  Observations/Objective:  General : Well sounding patient in no apparent distress HEENT: no hoarseness, no cough for duration of visit Lungs: speaks in complete sentences, no audible wheezing,  no apparent distress Neurological: alert, oriented x 3 Psychiatric: pleasant, judgement appropriate   Assessment and Plan:   Alece was seen today for cough.  Diagnoses and all orders for this  visit:  COVID-19 -     dexamethasone (DECADRON) 1 MG tablet; Take 3 tabs for 3 days, 2 tabs for 3 days 1 tab for 5 days. Take with food.    Covid 19 positive per rapid screening test in home today Risk factors include: CKD, Advanced age, PMR Symptoms are: mild Immue support reviewed  Take tylenol PRN temp 101+ Push hydration Regular ambulation or calf exercises exercises for clot prevention and 81 mg ASA unless contraindicated Sx supportive therapy suggested Follow up via mychart or telephone if needed Advised patient obtain O2 monitor; present to ED if persistently <90% or with severe dyspnea, CP, fever uncontrolled by tylenol, confusion, sudden decline Should remain in isolation until at least 5 days from onset of sx, 24-48 hours fever free without tylenol, sx such as cough are improved.      Follow Up Instructions:  I discussed the assessment and treatment plan with the patient. The patient was provided an opportunity to ask questions and all were answered. The patient agreed with the plan and demonstrated an understanding of the instructions.   The patient was advised to call back or seek an in-person evaluation if the symptoms worsen or if the condition fails to improve as anticipated.  I provided 20 minutes of non-face-to-face time during this encounter.   Magda Bernheim, NP

## 2021-05-05 ENCOUNTER — Other Ambulatory Visit: Payer: Self-pay | Admitting: Internal Medicine

## 2021-05-05 MED ORDER — MIRTAZAPINE 30 MG PO TABS: ORAL_TABLET | ORAL | 1 refills | Status: DC

## 2021-05-13 ENCOUNTER — Telehealth: Payer: Self-pay | Admitting: Nurse Practitioner

## 2021-05-13 NOTE — Telephone Encounter (Signed)
She will need to be evaluated in the office

## 2021-05-13 NOTE — Telephone Encounter (Signed)
Alexandria Davies did a phone visit for her on 02/24 for covid exposure. She said she is feeling better from that but has a swollen gland on the left side near her neck, that she has tried warm compresses and otc medications but said it hurts for her to swallow. Wanting to know if something can be sent in for her  ?

## 2021-06-17 ENCOUNTER — Ambulatory Visit: Payer: Medicare HMO | Admitting: Adult Health

## 2021-06-26 DIAGNOSIS — Z79899 Other long term (current) drug therapy: Secondary | ICD-10-CM | POA: Diagnosis not present

## 2021-06-26 DIAGNOSIS — M79604 Pain in right leg: Secondary | ICD-10-CM | POA: Diagnosis not present

## 2021-06-26 DIAGNOSIS — M79605 Pain in left leg: Secondary | ICD-10-CM | POA: Diagnosis not present

## 2021-07-03 ENCOUNTER — Ambulatory Visit: Payer: Medicare HMO | Admitting: Nurse Practitioner

## 2021-07-09 ENCOUNTER — Telehealth: Payer: Self-pay | Admitting: Internal Medicine

## 2021-07-09 NOTE — Progress Notes (Signed)
?  Chronic Care Management  ? ?Note ? ?07/09/2021 ?Name: Alexandria Davies MRN: [id number] DOB: December 31, 1925 ? ?Alexandria Davies is a 88 y.o. year old female who is a primary care patient of Alexandria Pinto, MD. I reached out to Alexandria Davies by phone today in response to a referral sent by [name]'s PCP, Alexandria Pinto, MD.  ? ?Alexandria Davies was given information about Chronic Care Management services today including:  ?CCM service includes personalized support from designated clinical staff supervised by her physician, including individualized plan of care and coordination with other care providers ?24/7 contact phone numbers for assistance for urgent and routine care needs. ?Service will only be billed when office clinical staff spend 20 minutes or more in a month to coordinate care. ?Only one practitioner may furnish and bill the service in a calendar month. ?The patient may stop CCM services at any time (effective at the end of the month) by phone call to the office staff. ? ? ?Patient agreed to services and verbal consent obtained.  ? ?Follow up plan: no copay ? ? ?Alexandria Davies ?Upstream Scheduler  ?

## 2021-07-16 ENCOUNTER — Ambulatory Visit (INDEPENDENT_AMBULATORY_CARE_PROVIDER_SITE_OTHER): Payer: Medicare HMO | Admitting: Nurse Practitioner

## 2021-07-16 ENCOUNTER — Encounter: Payer: Self-pay | Admitting: Nurse Practitioner

## 2021-07-16 VITALS — BP 138/72 | HR 61 | Temp 97.2°F | Wt 136.0 lb

## 2021-07-16 DIAGNOSIS — J309 Allergic rhinitis, unspecified: Secondary | ICD-10-CM

## 2021-07-16 DIAGNOSIS — K648 Other hemorrhoids: Secondary | ICD-10-CM

## 2021-07-16 DIAGNOSIS — Z79899 Other long term (current) drug therapy: Secondary | ICD-10-CM

## 2021-07-16 DIAGNOSIS — K635 Polyp of colon: Secondary | ICD-10-CM

## 2021-07-16 DIAGNOSIS — M159 Polyosteoarthritis, unspecified: Secondary | ICD-10-CM

## 2021-07-16 DIAGNOSIS — E782 Mixed hyperlipidemia: Secondary | ICD-10-CM

## 2021-07-16 DIAGNOSIS — E559 Vitamin D deficiency, unspecified: Secondary | ICD-10-CM | POA: Diagnosis not present

## 2021-07-16 DIAGNOSIS — F325 Major depressive disorder, single episode, in full remission: Secondary | ICD-10-CM

## 2021-07-16 DIAGNOSIS — I700 Atherosclerosis of aorta: Secondary | ICD-10-CM

## 2021-07-16 DIAGNOSIS — Z0000 Encounter for general adult medical examination without abnormal findings: Secondary | ICD-10-CM | POA: Diagnosis not present

## 2021-07-16 DIAGNOSIS — R0989 Other specified symptoms and signs involving the circulatory and respiratory systems: Secondary | ICD-10-CM | POA: Diagnosis not present

## 2021-07-16 DIAGNOSIS — M353 Polymyalgia rheumatica: Secondary | ICD-10-CM

## 2021-07-16 DIAGNOSIS — E039 Hypothyroidism, unspecified: Secondary | ICD-10-CM | POA: Diagnosis not present

## 2021-07-16 DIAGNOSIS — Z853 Personal history of malignant neoplasm of breast: Secondary | ICD-10-CM

## 2021-07-16 DIAGNOSIS — N1831 Chronic kidney disease, stage 3a: Secondary | ICD-10-CM

## 2021-07-16 NOTE — Progress Notes (Signed)
MEDICARE ANNUAL WELLNESS VISIT AND FOLLOW UP  Assessment:   Diagnoses and all orders for this visit:  1. Encounter for Medicare annual wellness exam Due Annually  2. Labile hypertension At goal. Not currently on medications. Monitor blood pressure at home; call if consistently over 140/80 Continue DASH diet.   Reminder to go to the ER if any CP, SOB, nausea, dizziness, severe HA, changes vision/speech, left arm numbness and tingling and jaw pain.  - CBC with Differential/Platelet  3. Aortic atherosclerosis ([place]) by LS spine XR 11/2018 Control blood pressure Control cholesterol, glucose,  Stay active and increase exercise.   - CBC with Differential/Platelet - COMPLETE METABOLIC PANEL WITH GFR -Lipid Panel  4. Internal hemorrhoids Stable.  No new events. Treat PRN Continue to monitor.  5. Allergic rhinitis, unspecified seasonality, unspecified trigger OTC antihistamine PRN Avoid triggers.  6. Polyp of colon, unspecified part of colon, unspecified type Hx of, No further colonoscopies due to age, no concerning sx  7. Hypothyroidism, unspecified type Continue Synthroid. Continue to monitor.  - TSH  8. Osteoarthritis of multiple joints, unspecified osteoarthritis type Past R TKA Continue Pregabalin  No new flares Followed by Orthopedics. Continue to monitor.   9. Stage 3a chronic kidney disease ([place]) Stay well hydrated Avoid NSAIDS Avoid processed meats, salt. Monitor sugars Continue to monitor.  10. Personal history of malignant neoplasm of breast Continue annual mammograms  -MM Digital Screening  11. Hyperlipidemia, mixed Continue Simvastatin. Continue low cholesterol diet and exercise.  Continue to monitor.  12. Vitamin D deficiency Above goal. Continue to monitor.  - VITAMIN D 25 Hydroxy (Vit-D Deficiency, Fractures)  13. Major depression in remission ([place]) Stable Continue stress management Stay well hydrated. Suggest 6-8 hours sleep.  i  14. PMR (polymyalgia rheumatica) (HCC) Stable.  No recurrent sx  Dr. Estanislado Pandy following  15. Medication management All medications reviewed at length. All questions and concerns addressed.  - CBC with Differential/Platelet - COMPLETE METABOLIC PANEL WITH GFR - Lipid panel - VITAMIN D 25 Hydroxy (Vit-D Deficiency, Fractures) - TSH  Over 40 minutes of exam, counseling, chart review and critical decision making was performed Future Appointments  Date Time Provider [place]  08/05/2021 11:00 AM Carleene Mains, RPH [facility] None  01/21/2022  2:30 PM Darrol Jump, NP [facility] None  03/05/2022  2:00 PM Unk Pinto, MD [facility] None  07/07/2022  4:00 PM Darrol Jump, NP [facility] None     Plan:   During the course of the visit the patient was educated and counseled about appropriate screening and preventive services including:   Pneumococcal vaccine  Prevnar 13 Influenza vaccine Td vaccine Screening electrocardiogram Bone densitometry screening Colorectal cancer screening Diabetes screening Glaucoma screening Nutrition counseling  Advanced directives: requested   Subjective:  Alexandria Davies is a 88 y.o. female who presents for Medicare Annual Wellness Visit and 3 month follow up.   she has a diagnosis of anxiety and has been on valium 5 mg TID PRN. she currently takes very rarely, takes 2-3 a month or so when she cannot sleep.   She was diagnosed with PMR last year, follows with Dr. Duke Salvia has successfully tapered off of prednisone without recurrent sx.   BMI is Body mass index is 23.34 kg/m., she has been working on diet, exercise is somewhat limited due to joint pains (mainly knees) recently, but generally active around her house and yard.  Wt Readings from Last 3 Encounters:  07/16/21 136 lb (61.7 kg)  02/27/21 136 lb 9.6  oz (62 kg)  01/06/21 137 lb 3.2 oz (62.2 kg)   She does not currently check BP at home, today their BP is BP:  138/72 Similar by manual recheck by provider. Last check in office was low, 122/60, historically labile.  She does not workout. She denies chest pain, shortness of breath, dizziness.  She has aortic atherosclerosis per XR 11/2018.    She is on cholesterol medication (simvastatin 20 mg daily) and denies myalgias. Her cholesterol is at goal. The cholesterol last visit was:   Lab Results  Component Value Date   CHOL 158 07/16/2021   HDL 62 07/16/2021   LDLCALC 78 07/16/2021   TRIG 93 07/16/2021   CHOLHDL 2.5 07/16/2021   She has been working on diet and exercise for glucose management, and denies foot ulcerations, increased appetite, nausea, paresthesia of the feet, polydipsia, polyuria, visual disturbances, vomiting and weight loss. Last A1C in the office was:  Lab Results  Component Value Date   HGBA1C 5.4 02/27/2021   She is on thyroid medication. Her medication was not changed last visit. Synthroid 1 tab (150 mcg on Wednesday, and 1/2 tab all other days).   Lab Results  Component Value Date   TSH 0.22 (L) 07/16/2021   She has CKD IIIa monitored at this office. Last GFR: Lab Results  Component Value Date   GFRNONAA 60 08/13/2020   Patient is on Vitamin D supplement.  Elevated last check.  She has held off talking the medication. Lab Results  Component Value Date   VD25OH 90 07/16/2021      Medication Review: Current Outpatient Medications on File Prior to Visit  Medication Sig Dispense Refill   aspirin (ASPIRIN 81) 81 MG EC tablet Take 1 tablet daily     Cholecalciferol (VITAMIN D PO) Take 5,000 Units by mouth 2 (two) times daily.     clotrimazole-betamethasone (LOTRISONE) cream Apply 1 application topically 2 (two) times daily. (Patient taking differently: Apply 1 application. topically as needed.) 30 g 1   gabapentin (NEURONTIN) 300 MG capsule Take  1 capsule  3 x /day  for Neuropathy pain 270 capsule 0   levothyroxine (SYNTHROID) 150 MCG tablet TAKE 1/2 TO 1 TABLET DAILY  AS DIRECTED ON AN EMPTY STOMACH WITH ONLY WATER FOR 30 MINUTES AND NO ANTACID MEDICATION, CALCIUM OR MAGNESIUM FOR 4 HOURS AND AVOID BIOTIN 90 tablet 3   mirtazapine (REMERON) 30 MG tablet Take  1 tablet  1 hour  before  Bedtime for Appetite & Sleep 90 tablet 1   pregabalin (LYRICA) 50 MG capsule Take 50 mg by mouth 3 (three) times daily.     Probiotic Product (PROBIOTIC PO) Take 1 tablet by mouth daily.     simvastatin (ZOCOR) 20 MG tablet Take one tablet at bedtime for Cholesterol. 90 tablet 3   dexamethasone (DECADRON) 1 MG tablet Take 3 tabs for 3 days, 2 tabs for 3 days 1 tab for 5 days. Take with food. 20 tablet 0   No current facility-administered medications on file prior to visit.    Allergies  Allergen Reactions   Ciprofloxacin    Epinephrine Other (See Comments)    Heart raced    Etodolac Hives   Synephrine     Palpitations    Flagyl [Metronidazole Hcl] Rash    Current Problems (verified) Patient Active Problem List   Diagnosis Date Noted   Aortic atherosclerosis ([place]) by LS spine XR 11/2018 02/06/2020   Major depression in remission ([place]) 10/16/2019  CKD (chronic kidney disease) stage 3, GFR 30-59 ml/min (HCC) 07/15/2019   PMR (polymyalgia rheumatica) ([place]) 10/26/2018   Insomnia 10/22/2014   Generalized anxiety disorder 10/22/2014   Labile hypertension 02/14/2014   Hyperlipidemia, mixed 02/14/2014   Vitamin D deficiency 02/14/2014   Rhinitis, allergic 08/19/2011   Diverticulosis 04/23/2011   Colon polyps 04/23/2011   PERSONAL HISTORY OF MALIGNANT NEOPLASM OF BREAST 12/07/2007   Hypothyroidism 12/05/2007   Osteoarthritis 12/05/2007   Internal hemorrhoids 11/08/2002    Screening Tests Immunization History  Administered Date(s) Administered   Influenza Split 12/01/2010   Influenza, High Dose Seasonal PF 11/12/2015, 11/23/2016, 12/01/2017, 12/06/2018, 12/12/2019, 12/03/2020   Influenza-Unspecified 10/31/2012, 11/29/2014   PFIZER(Purple Top)SARS-COV-2  Vaccination 05/13/2019, 06/06/2019   Pneumococcal Conjugate-13 10/18/2013   Pneumococcal Polysaccharide-23 03/20/2010   Td 10/17/2012, 01/12/2020    Preventative care: Last colonoscopy: 2009, hx of polyps, done d/t age 5:  07/2020.  Due 07/2021 Bone Density:  04/2019 - forearm T -1.4 , due 2023  Prior vaccinations: TD or Tdap: 2014  Influenza: 2022  Pneumococcal: 2012 Prevnar13: 2015 Shingles/Zostavax: declines Covid 19: 2/2, 2021, pfizer - declined booster  Names of Other Physician/Practitioners you currently use: 1. [facility] here for primary care 2. Dr. Katy Fitch, eye doctor, last visit 2022 3. Dr. Randol Kern, dentist, last visit 2020, encouraged to schedule    Patient Care Team: Unk Pinto, MD as PCP - General (Internal Medicine) Melrose Nakayama, MD as Consulting Physician (Orthopedic Surgery) Deneise Lever, MD as Consulting Physician (Pulmonary Disease) Inda Castle, MD (Inactive) as Consulting Physician (Gastroenterology) Carolan Clines, MD (Inactive) as Consulting Physician (Urology) Carleene Mains, [facility] as Pharmacist (Pharmacist)  SURGICAL HISTORY She  has a past surgical history that includes Abdominal hysterectomy; bladder tac; Bunionectomy; right shoulder (2003); Wrist fracture surgery (2003); Cystocele repair (2009); Tonsillectomy; Knee arthroscopy (Left, 01/17/2013); Colonoscopy (2009); Eye surgery (Bilateral); Total knee arthroplasty (Right, 11/26/2015); and Breast lumpectomy (2004). FAMILY HISTORY Her family history includes Alzheimer's disease in her mother and sister; Breast cancer in her sister; Cancer (age of onset: 57) in her sister; Cancer (age of onset: 54) in her sister; Colon cancer in her son; Heart disease in her mother; Testicular cancer in her father. SOCIAL HISTORY She  reports that she has never smoked. She has never used smokeless tobacco. She reports current alcohol use. She reports that she  does not use drugs.   MEDICARE WELLNESS OBJECTIVES: Physical activity: Current Exercise Habits: Home exercise routine, Time (Minutes): 15, Frequency (Times/Week): 3, Weekly Exercise (Minutes/Week): 45, Intensity: Mild Cardiac risk factors: Cardiac Risk Factors include: advanced age (>105mn, >>47women) Depression/mood screen:      07/21/2021    1:44 PM  Depression screen PHQ 2/9  Decreased Interest 0  Down, Depressed, Hopeless 0  PHQ - 2 Score 0    ADLs:     07/21/2021    1:44 PM 08/12/2020   11:41 PM  In your present state of health, do you have any difficulty performing the following activities:  Hearing? 0 0  Vision?  0  Difficulty concentrating or making decisions? 0 0  Walking or climbing stairs? 0 0  Dressing or bathing? 0 0  Doing errands, shopping? 0 0  Preparing Food and eating ? N   Using the Toilet? N   In the past six months, have you accidently leaked urine? N   Do you have problems with loss of bowel control? N   Managing your Medications? N   Managing your  Finances? N   Housekeeping or managing your Housekeeping? N      Cognitive Testing  Alert? Yes  Normal Appearance?Yes  Oriented to person? Yes  Place? Yes   Time? Yes  Recall of three objects?  Yes  Can perform simple calculations? Yes  Displays appropriate judgment?Yes  Can read the correct time from a watch face?Yes  EOL planning: Does Patient Have a Medical Advance Directive?: No Would patient like information on creating a medical advance directive?: Yes (MAU/Ambulatory/Procedural Areas - Information given)    Objective:     Today's Vitals   07/16/21 1447  BP: 138/72  Pulse: 61  Temp: (!) 97.2 F (36.2 C)  SpO2: 97%  Weight: 136 lb (61.7 kg)   Body mass index is 23.34 kg/m.  General appearance: alert, no distress, WD/WN, female HEENT: normocephalic, sclerae anicteric, TMs pearly, nares patent, no discharge or erythema, pharynx normal Oral cavity: MMM, no lesions Neck: supple, no  lymphadenopathy, no thyromegaly, no masses Heart: RRR, normal S1, S2, no murmurs Lungs: CTA bilaterally, no wheezes, rhonchi, or rales Abdomen: +bs, soft, non tender, non distended, no masses, no hepatomegaly, no splenomegaly Musculoskeletal: nontender, no swelling, no obvious deformity Extremities: no edema, no cyanosis, no clubbing Pulses: 2+ symmetric, upper and lower extremities, normal cap refill Neurological: alert, oriented x 3, CN2-12 intact, strength normal upper extremities and lower extremities, sensation normal throughout, DTRs 2+ throughout, no cerebellar signs, gait normal Psychiatric: normal affect, behavior normal, pleasant   Medicare Attestation I have personally reviewed: The patient's medical and social history Their use of alcohol, tobacco or illicit drugs Their current medications and supplements The patient's functional ability including ADLs,fall risks, home safety risks, cognitive, and hearing and visual impairment Diet and physical activities Evidence for depression or mood disorders  The patient's weight, height, BMI, and visual acuity have been recorded in the chart.  I have made referrals, counseling, and provided education to the patient based on review of the above and I have provided the patient with a written personalized care plan for preventive services.     Darrol Jump, NP   07/21/2021

## 2021-07-16 NOTE — Patient Instructions (Signed)
Healthy Aging: Exercise ?Regular exercise can help people live healthier and longer lives. You will learn about the different types of exercise and how exercise can help you stay healthy. ?To view the content, go to this web address: ?[email or web address] ? ?This video will expire on: 05/20/2023. If you need access to this video following this date, please reach out to the healthcare provider who assigned it to you. ?This information is not intended to replace advice given to you by your health care provider. Make sure you discuss any questions you have with your health care provider. ?Elsevier Patient Education ? [place]. ? ?

## 2021-07-17 LAB — COMPLETE METABOLIC PANEL WITH GFR
AG Ratio: 2.2 (calc) (ref 1.0–2.5)
ALT: 10 U/L (ref 6–29)
AST: 14 U/L (ref 10–35)
Albumin: 4.2 g/dL (ref 3.6–5.1)
Alkaline phosphatase (APISO): 68 U/L (ref 37–153)
BUN: 17 mg/dL (ref 7–25)
CO2: 25 mmol/L (ref 20–32)
Calcium: 9.7 mg/dL (ref 8.6–10.4)
Chloride: 108 mmol/L (ref 98–110)
Creat: 0.75 mg/dL (ref 0.60–0.95)
Globulin: 1.9 g/dL (calc) (ref 1.9–3.7)
Glucose, Bld: 86 mg/dL (ref 65–99)
Potassium: 4.6 mmol/L (ref 3.5–5.3)
Sodium: 144 mmol/L (ref 135–146)
Total Bilirubin: 0.9 mg/dL (ref 0.2–1.2)
Total Protein: 6.1 g/dL (ref 6.1–8.1)
eGFR: 74 mL/min/{1.73_m2} (ref >=60)

## 2021-07-17 LAB — CBC WITH DIFFERENTIAL/PLATELET
Absolute Monocytes: 484 cells/uL (ref 200–950)
Basophils Absolute: 30 cells/uL (ref 0–200)
Basophils Relative: 0.5 %
Eosinophils Absolute: 83 cells/uL (ref 15–500)
Eosinophils Relative: 1.4 %
HCT: 36.8 % (ref 35.0–45.0)
Hemoglobin: 12.4 g/dL (ref 11.7–15.5)
Lymphs Abs: 1923 cells/uL (ref 850–3900)
MCH: 30.2 pg (ref 27.0–33.0)
MCHC: 33.7 g/dL (ref 32.0–36.0)
MCV: 89.5 fL (ref 80.0–100.0)
MPV: 10.6 fL (ref 7.5–12.5)
Monocytes Relative: 8.2 %
Neutro Abs: 3381 cells/uL (ref 1500–7800)
Neutrophils Relative %: 57.3 %
Platelets: 230 10*3/uL (ref 140–400)
RBC: 4.11 10*6/uL (ref 3.80–5.10)
RDW: 12.6 % (ref 11.0–15.0)
Total Lymphocyte: 32.6 %
WBC: 5.9 10*3/uL (ref 3.8–10.8)

## 2021-07-17 LAB — VITAMIN D 25 HYDROXY (VIT D DEFICIENCY, FRACTURES): Vit D, 25-Hydroxy: 90 ng/mL (ref 30–100)

## 2021-07-17 LAB — LIPID PANEL
Cholesterol: 158 mg/dL (ref <200)
HDL: 62 mg/dL (ref >=50)
LDL Cholesterol (Calc): 78 mg/dL (calc)
Non-HDL Cholesterol (Calc): 96 mg/dL (calc) (ref <130)
Total CHOL/HDL Ratio: 2.5 (calc) (ref <5.0)
Triglycerides: 93 mg/dL (ref <150)

## 2021-07-17 LAB — TSH: TSH: 0.22 mIU/L — ABNORMAL LOW (ref 0.40–4.50)

## 2021-07-18 ENCOUNTER — Other Ambulatory Visit: Payer: Self-pay | Admitting: Nurse Practitioner

## 2021-07-18 DIAGNOSIS — E039 Hypothyroidism, unspecified: Secondary | ICD-10-CM

## 2021-07-22 ENCOUNTER — Other Ambulatory Visit: Payer: Self-pay | Admitting: Internal Medicine

## 2021-07-22 DIAGNOSIS — E039 Hypothyroidism, unspecified: Secondary | ICD-10-CM

## 2021-07-22 MED ORDER — LEVOTHYROXINE SODIUM 75 MCG PO TABS: ORAL_TABLET | ORAL | 3 refills | Status: DC

## 2021-07-26 ENCOUNTER — Other Ambulatory Visit: Payer: Self-pay | Admitting: Adult Health

## 2021-08-01 ENCOUNTER — Telehealth: Payer: Self-pay

## 2021-08-01 NOTE — Telephone Encounter (Signed)
LM-08/01/21-Calling pt. To confirm CP visit for 6/6 at 11:00am. Unable to reach pt. [place].  Total time spent: 2 min.

## 2021-08-04 NOTE — Telephone Encounter (Signed)
LM-08/04/21-Calling pt. To confirm CP visit for 6/6 at 11:00AM. Spoke to pt. And confirmed visit. Pt. Mentioned she thinks she has laryngytitis requesting medications. Assisted pt. By transferring her directly to office to request medication.  Total time spent: 5 min.

## 2021-08-05 ENCOUNTER — Ambulatory Visit: Payer: Medicare HMO | Admitting: Pharmacy Technician

## 2021-08-05 ENCOUNTER — Telehealth: Payer: Self-pay

## 2021-08-05 DIAGNOSIS — Z79899 Other long term (current) drug therapy: Secondary | ICD-10-CM

## 2021-08-05 DIAGNOSIS — N1831 Chronic kidney disease, stage 3a: Secondary | ICD-10-CM

## 2021-08-05 DIAGNOSIS — E782 Mixed hyperlipidemia: Secondary | ICD-10-CM

## 2021-08-05 DIAGNOSIS — R2681 Unsteadiness on feet: Secondary | ICD-10-CM

## 2021-08-05 DIAGNOSIS — Z9181 History of falling: Secondary | ICD-10-CM

## 2021-08-05 DIAGNOSIS — F411 Generalized anxiety disorder: Secondary | ICD-10-CM

## 2021-08-05 DIAGNOSIS — R0989 Other specified symptoms and signs involving the circulatory and respiratory systems: Secondary | ICD-10-CM

## 2021-08-05 DIAGNOSIS — M159 Polyosteoarthritis, unspecified: Secondary | ICD-10-CM

## 2021-08-05 DIAGNOSIS — G4700 Insomnia, unspecified: Secondary | ICD-10-CM

## 2021-08-05 DIAGNOSIS — E039 Hypothyroidism, unspecified: Secondary | ICD-10-CM

## 2021-08-05 DIAGNOSIS — Z6824 Body mass index (BMI) 24.0-24.9, adult: Secondary | ICD-10-CM

## 2021-08-05 NOTE — Telephone Encounter (Signed)
Patient called complaining of throat burning in the morning, can't talk, and feels bad. She said she can't come in. I spoke with Hinton Dyer and without her coming in to be evaluated that she can recommend regular Mucinex and salt water gargle for her throat.

## 2021-08-06 DIAGNOSIS — R070 Pain in throat: Secondary | ICD-10-CM | POA: Diagnosis not present

## 2021-08-06 DIAGNOSIS — B349 Viral infection, unspecified: Secondary | ICD-10-CM | POA: Diagnosis not present

## 2021-08-06 DIAGNOSIS — J029 Acute pharyngitis, unspecified: Secondary | ICD-10-CM | POA: Diagnosis not present

## 2021-08-08 NOTE — Progress Notes (Signed)
Pharmacist Visit  Alexandria Davies, PAE [id number]  19 years, Female  DOB: [birth year]  M: [phone] Care Team: Bethena Roys  Assigned to: Rosary Lively  Assigned On: 07/10/2021 __________________________________________________ Chronic Conditions Patient's Chronic Conditions: Hypertension (HTN), Allergic rhinitis, Hypothyroidism, Osteoarthritis, Chronic Kidney Disease (CKD), Hyperlipidemia/Dyslipidemia (HLD), Insomnia, Anxiety, Depression, Other List Other Conditions (separated by comma): Vitamin D deficiency, internal hemorrhoids, Aortic atherosclerosis, Diverticulosis, Colon polyps, PMR  Disease Assessments Current BP: 138/72 Current HR: 61 taken on: 07/16/2021 Previous BP: 138/76 Previous HR: 65 taken on: 06/26/2021 Weight: 136 BMI: 23.34 Last GFR: 74 taken on: 07/16/2021  Visit Completed on: 08/05/2021 Did patient bring medications to appointment?: No What source (s) was used to reconcile medications this visit?: EMR list, Verbal recall from patient/caregiver Why did the patient present?: CCM Initial Visit Marital status?: Married Details: [name] Retired? 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 Worked at [employer] x 5 years. What does the patient do during the day?: Limited by knee pain and takes care of husband who has recent fall with rib fractures. Who does the patient spend their time with and what do they do?: Husband. She states she has outlived most of her friends and is not able to socialize as she would like. Lifestyle habits such as diet and exercise?: No exercise and not following a specific diet other than salt restriction. Alcohol, tobacco, and illicit drug usage?: None What is the patient's sleep pattern?: No sleep issues, Trouble falling asleep How many hours per night does patient typically sleep?: 8+ Patient pleased with health care they are receiving?: Yes Factors that may affect medication adherence?:  Perceived lack of benefit of therapy, Lack of understanding / insight of condition, Medication side effects Name and location of Current pharmacy: [facility] Current Rx insurance plan: [employer] Are meds delivered by current pharmacy?: Yes - by mail order pharmacy Would patient benefit from direct intervention of clinical lead in dispensing process to optimize clinical outcomes?: No Any additional demeanor/mood notes?: Very pleasant lady presenting via phone for CCM initial visit. Patient reported loss of voice and feeling like she has laryngitis. She has her 89-birthday party this weekend and is excited to see family. She utilizes a pill box for organization and declines any copay or pharmacy barriers. She can be non-compliant with medications and stop them without consulting provider if she does not like how it makes her feel.  SDOH: Accountable Health Communities Health-Related Social Needs Screening Tool (BloggerBowl.es) SDOH questions were documented and reviewed (EMR or [employer]) within the past 3 months?: No What is your living situation today? (ref #1): I have a steady place to live Think about the place you live. Do you have problems with any of the following? (ref #2): None of the above Within the past 12 months, you worried that your food would run out before you got money to buy more (ref #3): Never true Within the past 12 months, the food you bought just didn't last and you didn't have money to get more (ref #4): Never true In the past 12 months, has lack of reliable transportation kept you from medical appointments, meetings, work or from getting things needed for daily living? (ref #5): No In the past 12 months, has the electric, gas, oil, or water company threatened to shut off services in your home? (ref #6): No How often does anyone, including family and friends, physically hurt you? (ref  #7): Never (1)  How often does anyone, including family and friends, insult or talk down to you? (ref #8): Never (1) How often does anyone, including friends and family, threaten you with harm? (ref #9): Never (1) How often does anyone, including family and friends, scream or curse at you? (ref #10): Never (1)  Hypertension (HTN) Discussed with patient today?: Yes Is patient able to obtain BP reading today?: No Goal: <140/90 mmHG Hypertension Stage: Stage 1 (SBP: 130-139 or DBP: 80-89) Is Patient checking BP at home?: No How often does patient miss taking their blood pressure medications?: Not on BP medications Has patient experienced hypotension, dizziness, falls or bradycardia?: Yes Provide Details: Patient states "a few falls", some dizziness Check present secondary causes (below) for HTN: CKD BP RPM device: Does patient qualify?: No We discussed: DASH diet:  following a diet emphasizing fruits and vegetables and low-fat dairy products along with whole grains, fish, poultry, and nuts. Reducing red meats and sugars., Reducing the amount of salt intake to '1500mg'$ /per day., Getting enough potassium in your diet equaling 3500-'5000mg'$ /day.  This helps to regulate BP by balancing out the effects of salt., Proper Home BP Measurement, Increasing movement, Contacting PCP office for signs and symptoms of high or low blood pressure (hypotension, dizziness, falls, headaches, edema) Assessment:: Controlled Drug: None Assessment: Query Appropriateness Additional Info: Higher BP goal due to risk for falls Plan to Start: Checking home BP at least 3 times weekly Plan to Review: CMP Plan to Counsel: Counseled on water intake, low salt diet, and increasing activity as tolerated Plan to (other): Patient to call if BP >140/90 Pharmacist Follow up: No changes.  Hyperlipidemia/Dyslipidemia (HLD) Last Lipid panel on: 07/16/2021 TC (Goal<200): 158 LDL: 78 HDL (Goal>40): 62 TG (Goal<150): 93 ASCVD 10-year  risk?is:: N/A due to Age > 41 Discussed with patient today?: Yes LDL Goal: <70 Check present secondary causes (below) that can lead to increased cholesterol levels (multi-choice optional): CKD, Hypothyroidism We discussed: How a diet high in fruits/vegetables/nuts/whole grains/beans may help to reduce your cholesterol. Increasing soluble fiber intake.  Avoiding sugary foods and trans fat, limiting carbohydrates, and reducing portion sizes. Recommended increasing intake of healthy fats into their diet, Complications of hyperlipidemia and prevention methods with patient for 5-10 minutes, Increasing exercise (walking, biking, swimming) to a goal of 30 minutes per day, as able based on current activity level and health or as directed by your healthcare provider Assessment:: Uncontrolled Drug: Simvastatin '20mg'$  daily Assessment: Appropriate, Query Effectiveness Additional Info: LDL above goal of 70. Plan to Review: Lipid Panel Plan to Counsel: Counseled on increasing fiber intake and dietary modifications   Anxiety Previous GAD-7 Score: Unknown Discussed with patient today?: Yes Completing the GAD-7 Questionnaire today?: Yes GAD-7: Over the last 2 weeks, how often have you been bothered by the following problems?: Done Feeling nervous, anxious or on edge: 1 (several days) Not being able to stop or control worrying: 0 (Not at all) Worrying too much about different things: 1 (several days) Trouble relaxing: 1 (several days) Being so restless that it is hard to sit still: 2 (More than half of the days) Becoming easily annoyed or irritable: 1 (several days) Feeling afraid, as in something awful might happen: 1 (several days) Total GAD-7 Score (please total responses for questions above): 7 Anxiety Severity: Mild Anxiety (Score 5-9) In your opinion, how do you feel your anxiety symptoms have been controlled over the past 3 months?: Stable / stayed the same Have you ever had a panic attack?:  Yes Details:  Unable to describe Are there any particular situations or events that make you very fearful (airplanes, heights, crowds, leaving the house)?: None What non-pharmacologic therapies (CBT, talk therapy, meditation) have you tried to help manage your anxiety?: None We discussed: Anxiety management techniques (e.g., relaxation, deep breathing, positive visualization, reassuring self-statements)., Impact of caffeine and/or nicotine on anxiety symptoms, Proper use of medications to treat anxiety and the expected timeline of effects, side effects and to report back if symptoms persist despite medical treatment Assessment:: Controlled Additional Info: Husbands health and stress with taking care of him has caused increase in situational anxiety Plan to (other): Increase walking, outdoor time, breathing exercises for stress relief  Chronic kidney disease (CKD) Previous GFR: 67 taken on: 02/27/2021 The current microalbumin ratio is: 6 tested on: 02/27/2021 Discussed with patient today?: Yes CKD Stage: Stage 2 (GFR 61-90 ml/min) Albuminuria Stage: A1 (<30) Contributing factors for developing CKD: HTN Is Patient taking statin medication: Yes Is patient taking ACEi / ARB?: No Reason patient is not taking ACEi / ARB: Unknown Renal dose adjustments recommended?: No We discussed: Limiting dietary sodium intake to less than 2000 mg / day, Maintaining blood pressure control, Avoidance of nephrotoxic drugs (NSAIDs) Assessment:: Controlled Plan to Review: SCR, GFR Plan to Counsel: Increase water intake to 4-6 glasses/day Plan to (other): Message to PCP to properly capture CKD stage in diagnosis codes.  Pharmacist Follow up: Stable  Hypothyroidism Current TSH: 0.22 taken on: 07/16/2021 Previous TSH: 1.17 taken on: 02/27/2021 Current T4: 1.1 taken on: 06/26/2021 Current T3: Unknown Discussed with patient today?: Yes Patient has experienced the following symptoms: Hyperlipidemia,  Fatigue We discussed: Proper administration of levothyroxine (empty stomach, 30 min before breakfast and with no other meds / vitamins), Monitoring for signs / symptoms of hypothyroidism (weight gain, fatigue, hair loss, cold intolerance) Assessment:: Uncontrolled Drug: Levothyroxine 7mg daily Assessment: Appropriate, Query Effectiveness Plan to Order: Consider TSH 08/29/21 or after for adjustments Plan to Review: TSH, T4 Pharmacist Follow up: Recommend PCP re-draw TSH 08/29/21 or after  Osteoarthritis Discussed with patient today?: Yes Where is the pain primarily located?: Knees Prior surgeries / interventions and response: Knee replacement on R knee How would you rate your pain without medications (scale of 1-10)?: 8 How would you rate your pain with medications (scale of 1-10)?: 6 What makes your pain better?: Voltaren and Tylenol help some per patient. What makes your pain worse?: Activity Any issues with constipation related to your medications?: No We discussed: Considering referral to physical therapy., Benefit of strength exercises to help improve pain control, Benefit of weight loss on improving pain Patient has tried and failed: Injections Assessment:: Uncontrolled Additional Info: Needs knee replacement due to bone on bone. Refuses surgery at her age Plan to (other): Declines PT Pharmacist Follow up: Follow up with Orthopedics  General Discussed with patient today?: Yes What condition are we assessing today?: Loss of voice Other Information: Patient thinks she has laryngitis. Must be seen in office for any treatment per PCP. Patient will try Tylenol every 6 hours and salt water gurgles. Plan to (other): Patient to call in 48 hours if no relief Pharmacist Follow up: Suggested walk in clinic close to home if necessary Exercise, Diet and Non-Drug Coordination Needs Additional exercise counseling points. We discussed: incorporating flexibility, balance, and strength training  exercises, decreasing sedentary behavior Additional diet counseling points. We discussed: aiming to consume at least 8 cups of water day, limiting caffeine intake, key components of the DASH diet Discussed Non-Drug Care Coordination  Needs: Yes Does Patient have Medication financial barriers?: No  Engagement Notes Marda Stalker on 08/05/2021 03:58 PM CPP Prep: 48mn CPP OV: 737m CPP Doc: [id number] Care Plan: 21m28mClinical Summary Patient Risk: Low Next CCM Follow Up: CCS Transfer Next AWV: 07/07/22 Next PCP Visit: 01/21/22  Summary for PCP:  1. Suggested patient start checking BP at home daily or 3 times weekly. 2. LDL above goal of 70. Suggested dietary modifications. 3. Patient stopped Remeron, Lyrica, Gabapentin. 4. TSH low. PCP consider re-drawing TSH 08/29/21 or after for medication adjustment. 5. PCP please capture correct CKD stage in problem list. Current GFR is 74 which is stage 2.  Attestation Statement:: CCM Services:  This encounter meets complex CCM services and moderate to high medical decision making.  Prior to outreach and patient consent for Chronic Care Management, I referred this patient for services after reviewing the nominated patient list or from a personal encounter with the patient.  I have personally reviewed this encounter including the documentation in this note and have collaborated with the care management provider regarding care management and care coordination activities to include development and update of the comprehensive care plan I am certifying that I agree with the content of this note and encounter as supervising physician.  AverMarda StalkerarmD Clinical Pharmacist AverNaida [email or web address] [phone]

## 2021-08-19 ENCOUNTER — Telehealth: Payer: Self-pay | Admitting: Nurse Practitioner

## 2021-08-19 NOTE — Telephone Encounter (Signed)
[facility] and was prescribed amxocillin, she said that she has finished the medicine but has now broke out and is itching really bad. She was wanting a cream that we sent in for her almost 3 years ago. I told her that we would need to treat her before being able to send that in and she said she couldn't come in

## 2021-08-20 NOTE — Telephone Encounter (Signed)
Patient states that she is taking Famotidine and Clotrimazole cream. Will finish that and if not any better, will call the office to be seen. Wanted [place] to send something in for her but I informed her she would need to be seen in person.

## 2021-08-25 ENCOUNTER — Encounter: Payer: Self-pay | Admitting: Nurse Practitioner

## 2021-08-25 ENCOUNTER — Ambulatory Visit (INDEPENDENT_AMBULATORY_CARE_PROVIDER_SITE_OTHER): Payer: Medicare HMO | Admitting: Nurse Practitioner

## 2021-08-25 VITALS — BP 140/62 | HR 63 | Temp 97.7°F | Wt 128.0 lb

## 2021-08-25 DIAGNOSIS — Z889 Allergy status to unspecified drugs, medicaments and biological substances status: Secondary | ICD-10-CM

## 2021-08-25 DIAGNOSIS — R21 Rash and other nonspecific skin eruption: Secondary | ICD-10-CM

## 2021-08-25 DIAGNOSIS — L539 Erythematous condition, unspecified: Secondary | ICD-10-CM

## 2021-08-25 MED ORDER — DEXAMETHASONE SODIUM PHOSPHATE 10 MG/ML IJ SOLN
10.0000 mg | Freq: Once | INTRAMUSCULAR | Status: AC
  Administered 2021-08-25: 10 mg via INTRAMUSCULAR

## 2021-08-27 ENCOUNTER — Telehealth: Payer: Self-pay | Admitting: Nurse Practitioner

## 2021-08-27 NOTE — Telephone Encounter (Signed)
Pt says she is feeling worse from her rash, wanting to know what to do. She says that she has been trying the regimen Tonya suggested but yesterday she called saying she had just picked up the zyrtec and wasn't sure if she was going to take it. So I am not sure if she truly has given it time to work or not

## 2021-08-29 DIAGNOSIS — E039 Hypothyroidism, unspecified: Secondary | ICD-10-CM | POA: Diagnosis not present

## 2021-08-29 DIAGNOSIS — N1831 Chronic kidney disease, stage 3a: Secondary | ICD-10-CM | POA: Diagnosis not present

## 2021-08-29 DIAGNOSIS — E782 Mixed hyperlipidemia: Secondary | ICD-10-CM | POA: Diagnosis not present

## 2021-08-29 DIAGNOSIS — G4700 Insomnia, unspecified: Secondary | ICD-10-CM | POA: Diagnosis not present

## 2021-09-09 ENCOUNTER — Other Ambulatory Visit: Payer: Self-pay | Admitting: Internal Medicine

## 2021-09-09 DIAGNOSIS — Z1231 Encounter for screening mammogram for malignant neoplasm of breast: Secondary | ICD-10-CM

## 2021-09-11 ENCOUNTER — Encounter: Payer: Self-pay | Admitting: Nurse Practitioner

## 2021-09-11 ENCOUNTER — Ambulatory Visit (INDEPENDENT_AMBULATORY_CARE_PROVIDER_SITE_OTHER): Payer: Medicare HMO | Admitting: Nurse Practitioner

## 2021-09-11 VITALS — BP 138/60 | HR 67 | Temp 97.7°F | Wt 128.0 lb

## 2021-09-11 DIAGNOSIS — R21 Rash and other nonspecific skin eruption: Secondary | ICD-10-CM

## 2021-09-11 DIAGNOSIS — Z79899 Other long term (current) drug therapy: Secondary | ICD-10-CM

## 2021-09-11 DIAGNOSIS — R0989 Other specified symptoms and signs involving the circulatory and respiratory systems: Secondary | ICD-10-CM

## 2021-09-11 MED ORDER — MIRTAZAPINE 30 MG PO TABS: ORAL_TABLET | ORAL | 1 refills | Status: DC

## 2021-09-11 NOTE — Progress Notes (Signed)
Assessment and Plan:  Alexandria Davies was seen today for an episodic visit.  Diagnoses and all order for this visit:  1. Rash and nonspecific skin eruption Resolved. Continue OTC Cetaphil Cream  2. Labile hypertension Controlled  Continue medications;  Discussed DASH (Dietary Approaches to Stop Hypertension) DASH diet is lower in sodium than a typical American diet. Cut back on foods that are high in saturated fat, cholesterol, and trans fats. Eat more whole-grain foods, fish, poultry, and nuts Remain active and exercise as tolerated daily.  Monitor BP at home-Call if greater than 130/80.   3.  Medication Management All medications discussed and reviewed in full. All questions and concerns regarding medications addressed.    Requesting refill on Remeron.   Notify office for further evaluation and treatment, questions or concerns if s/s fail to improve. The risks and benefits of my recommendations, as well as other treatment options were discussed with the patient today. Questions were answered.  Further disposition pending results of labs. Discussed med's effects and SE's.    Over 15 minutes of exam, counseling, chart review, and critical decision making was performed.   Future Appointments  Date Time Provider [place]  10/16/2021  2:10 PM [facility] 3 [facility]  01/21/2022  2:30 PM Lafe Clerk, Kenney Houseman, NP [facility] None  03/05/2022  2:00 PM Unk Pinto, MD [facility] None  07/07/2022  4:00 PM Darrol Jump, NP [facility] None    ------------------------------------------------------------------------------------------------------------------   HPI BP 138/60   Pulse 67   Temp 97.7 F (36.5 C)   Wt 128 lb (58.1 kg)   SpO2 98%   BMI 21.97 kg/m    Alexandria Davies is a 88 y.o. female who presents for evaluation of a rash involving the  entire body  that has now healed.  She has been using OTC Cetaphil lotion with improvement.    Past Medical  History:  Diagnosis Date   Anxiety    Breast cancer ([place])    left   Colon polyp 2009   BENIGN POLYPOID   Diverticulosis of colon (without mention of hemorrhage) 2009   DJD (degenerative joint disease)    Family history of malignant neoplasm of gastrointestinal tract 05/06/2011   HOH (hard of hearing)    Right ear   Hypercholesteremia    Hypothyroidism    IBS (irritable bowel syndrome)    Internal hemorrhoids without mention of complication    Primary osteoarthritis of right knee 11/26/2015   S/p TKA   Sinus drainage    Vitiligo    Wears dentures    top   Wears glasses      Allergies  Allergen Reactions   Ciprofloxacin    Epinephrine Other (See Comments)    Heart raced    Etodolac Hives   Lyrica [Pregabalin]    Synephrine     Palpitations    Flagyl [Metronidazole Hcl] Rash    Current Outpatient Medications on File Prior to Visit  Medication Sig   aspirin (ASPIRIN 81) 81 MG EC tablet Take 1 tablet daily   Cholecalciferol (VITAMIN D PO) Take 5,000 Units by mouth 2 (two) times daily.   levothyroxine (SYNTHROID) 75 MCG tablet Take  1 tablet  Daily  on an empty stomach with only water for 30 minutes & no Antacid meds, Calcium or Magnesium for 4 hours & avoid Biotin   Probiotic Product (PROBIOTIC PO) Take 1 tablet by mouth daily.   simvastatin (ZOCOR) 20 MG tablet TAKE 1 TABLET AT BEDTIME  FOR CHOLESTEROL   clotrimazole-betamethasone (LOTRISONE) cream Apply 1 application topically 2 (two) times daily. (Patient not taking: Reported on 08/25/2021)   gabapentin (NEURONTIN) 300 MG capsule Take  1 capsule  3 x /day  for Neuropathy pain (Patient not taking: Reported on 08/05/2021)   mirtazapine (REMERON) 30 MG tablet Take  1 tablet  1 hour  before  Bedtime for Appetite & Sleep (Patient not taking: Reported on 08/05/2021)   pregabalin (LYRICA) 50 MG capsule Take 50 mg by mouth 3 (three) times daily. (Patient not taking: Reported on 08/05/2021)   No current facility-administered medications  on file prior to visit.    ROS: all negative except what is noted in the HPI.   Physical Exam:  BP 138/60   Pulse 67   Temp 97.7 F (36.5 C)   Wt 128 lb (58.1 kg)   SpO2 98%   BMI 21.97 kg/m   General Appearance: NAD.  Awake, conversant and cooperative. Eyes: PERRLA, EOMs intact.  Sclera white.  Conjunctiva without erythema. Sinuses: No frontal/maxillary tenderness.  No nasal discharge. Nares patent.  ENT/Mouth: Ext aud canals clear.  Bilateral TMs w/DOL and without erythema or bulging. Hearing intact.  Posterior pharynx without swelling or exudate.  Tonsils without swelling or erythema.  Neck: Supple.  No masses, nodules or thyromegaly. Respiratory: Effort is regular with non-labored breathing. Breath sounds are equal bilaterally without rales, rhonchi, wheezing or stridor.  Cardio: RRR with no MRGs. Brisk peripheral pulses without edema.  Abdomen: Active BS in all four quadrants.  Soft and non-tender without guarding, rebound tenderness, hernias or masses. Lymphatics: Non tender without lymphadenopathy.  Musculoskeletal: Full ROM, 5/5 strength, normal ambulation.  No clubbing or cyanosis. Skin:  Appropriate color for ethnicity.  Warm.  Dry Neuro: CN II-XII grossly normal. Normal muscle tone without cerebellar symptoms and intact sensation.   Psych: AO X 3,  appropriate mood and affect, insight and judgment.     Darrol Jump, NP 2:12 PM [facility]

## 2021-10-16 ENCOUNTER — Ambulatory Visit
Admission: RE | Admit: 2021-10-16 | Discharge: 2021-10-16 | Disposition: A | Discharge status: Home / Self Care | Payer: Medicare HMO | Source: Ambulatory Visit | Attending: Internal Medicine | Admitting: Internal Medicine

## 2021-10-16 DIAGNOSIS — Z1231 Encounter for screening mammogram for malignant neoplasm of breast: Secondary | ICD-10-CM | POA: Diagnosis not present

## 2021-11-03 DIAGNOSIS — G4700 Insomnia, unspecified: Secondary | ICD-10-CM | POA: Diagnosis not present

## 2021-11-03 DIAGNOSIS — Z008 Encounter for other general examination: Secondary | ICD-10-CM | POA: Diagnosis not present

## 2021-11-03 DIAGNOSIS — E785 Hyperlipidemia, unspecified: Secondary | ICD-10-CM | POA: Diagnosis not present

## 2021-11-03 DIAGNOSIS — N182 Chronic kidney disease, stage 2 (mild): Secondary | ICD-10-CM | POA: Diagnosis not present

## 2021-11-03 DIAGNOSIS — R32 Unspecified urinary incontinence: Secondary | ICD-10-CM | POA: Diagnosis not present

## 2021-11-03 DIAGNOSIS — M8580 Other specified disorders of bone density and structure, unspecified site: Secondary | ICD-10-CM | POA: Diagnosis not present

## 2021-11-03 DIAGNOSIS — I739 Peripheral vascular disease, unspecified: Secondary | ICD-10-CM | POA: Diagnosis not present

## 2021-11-03 DIAGNOSIS — I700 Atherosclerosis of aorta: Secondary | ICD-10-CM | POA: Diagnosis not present

## 2021-11-03 DIAGNOSIS — R69 Illness, unspecified: Secondary | ICD-10-CM | POA: Diagnosis not present

## 2021-11-03 DIAGNOSIS — E039 Hypothyroidism, unspecified: Secondary | ICD-10-CM | POA: Diagnosis not present

## 2021-11-03 DIAGNOSIS — R030 Elevated blood-pressure reading, without diagnosis of hypertension: Secondary | ICD-10-CM | POA: Diagnosis not present

## 2021-11-03 DIAGNOSIS — M1990 Unspecified osteoarthritis, unspecified site: Secondary | ICD-10-CM | POA: Diagnosis not present

## 2021-11-26 ENCOUNTER — Encounter: Payer: Self-pay | Admitting: Internal Medicine

## 2021-12-10 DIAGNOSIS — Z23 Encounter for immunization: Secondary | ICD-10-CM | POA: Diagnosis not present

## 2021-12-26 DIAGNOSIS — M79662 Pain in left lower leg: Secondary | ICD-10-CM | POA: Diagnosis not present

## 2021-12-26 DIAGNOSIS — M1712 Unilateral primary osteoarthritis, left knee: Secondary | ICD-10-CM | POA: Diagnosis not present

## 2022-01-12 ENCOUNTER — Encounter: Payer: Self-pay | Admitting: Nurse Practitioner

## 2022-01-12 ENCOUNTER — Ambulatory Visit (INDEPENDENT_AMBULATORY_CARE_PROVIDER_SITE_OTHER): Payer: Medicare HMO | Admitting: Nurse Practitioner

## 2022-01-12 VITALS — BP 144/70 | HR 69 | Temp 97.8°F | Resp 16 | Ht 65.75 in | Wt 130.2 lb

## 2022-01-12 DIAGNOSIS — R8290 Unspecified abnormal findings in urine: Secondary | ICD-10-CM | POA: Diagnosis not present

## 2022-01-12 DIAGNOSIS — M353 Polymyalgia rheumatica: Secondary | ICD-10-CM | POA: Diagnosis not present

## 2022-01-12 DIAGNOSIS — R69 Illness, unspecified: Secondary | ICD-10-CM | POA: Diagnosis not present

## 2022-01-12 DIAGNOSIS — I700 Atherosclerosis of aorta: Secondary | ICD-10-CM

## 2022-01-12 DIAGNOSIS — J309 Allergic rhinitis, unspecified: Secondary | ICD-10-CM | POA: Diagnosis not present

## 2022-01-12 DIAGNOSIS — E559 Vitamin D deficiency, unspecified: Secondary | ICD-10-CM | POA: Diagnosis not present

## 2022-01-12 DIAGNOSIS — K648 Other hemorrhoids: Secondary | ICD-10-CM | POA: Diagnosis not present

## 2022-01-12 DIAGNOSIS — F411 Generalized anxiety disorder: Secondary | ICD-10-CM

## 2022-01-12 DIAGNOSIS — N1831 Chronic kidney disease, stage 3a: Secondary | ICD-10-CM | POA: Diagnosis not present

## 2022-01-12 DIAGNOSIS — Z23 Encounter for immunization: Secondary | ICD-10-CM

## 2022-01-12 DIAGNOSIS — Z79899 Other long term (current) drug therapy: Secondary | ICD-10-CM

## 2022-01-12 DIAGNOSIS — R0989 Other specified symptoms and signs involving the circulatory and respiratory systems: Secondary | ICD-10-CM | POA: Diagnosis not present

## 2022-01-12 DIAGNOSIS — E039 Hypothyroidism, unspecified: Secondary | ICD-10-CM | POA: Diagnosis not present

## 2022-01-12 DIAGNOSIS — E782 Mixed hyperlipidemia: Secondary | ICD-10-CM

## 2022-01-12 DIAGNOSIS — F325 Major depressive disorder, single episode, in full remission: Secondary | ICD-10-CM

## 2022-01-12 MED ORDER — DIAZEPAM 5 MG PO TABS: ORAL_TABLET | ORAL | 0 refills | Status: DC

## 2022-01-12 NOTE — Patient Instructions (Signed)
Eating Plan for Brain Health A healthy diet is an important part of overall wellness, and it may help to improve brain function. Eating a healthy diet can lower the risk for Alzheimer's disease and dementia. It also may slow the progression of those diseases. Depending on your overall health and any conditions you have, you may need to follow certain dietary guidelines. Work with your health care provider or nutrition specialist (dietitian) to create an eating plan that is right for you. What are tips for following this plan? Reading food labels Check the Nutrition Facts on food labels for the Daily Value (DV) percentages of nutrients in one serving of food. DVs are based on the recommended amounts of nutrients to eat, or not to exceed, each day. Aim for a DV of 5% or less per serving for: Saturated fats. Trans fats. Cholesterol. Salt (sodium). Choose whole grains instead of processed grains such as white flour, white bread, and white rice. "Whole grain" or "whole wheat" should be among the first items in the ingredients list. Shopping Before you go grocery shopping, plan your meals and make a shopping list. Look for lean meats, fish, low-fat dairy products, fruits and vegetables, and whole grains. Avoid buying processed or prepared foods. These are higher in added sugar, fat, and sodium. Cooking Use healthy oils, such as olive oil, instead of butter or margarine. Avoid frying foods. Healthier ways of cooking include roasting, baking, poaching, and steaming. Many healthy foods can be prepared without cooking, such as canned tuna, nuts, beans, vegetables, and fruits. Meal planning Plan to eat one or more servings of each of these foods every day: Green leafy vegetables. Nuts. Whole grains. Plan to eat berries, beans, fish, and poultry two or more times every week. Avoid red meats, butter, cheese, sweets, and fried foods. Eat 6 smaller meals throughout the day rather than 3 full  meals. General tips If you have difficulty chewing or swallowing: Choose foods that are tender, soft, and moist. Avoid foods that are sticky, hard, dry, chewy, or crunchy. Cut food into pieces that are smaller than your thumbnail. If you are underweight: Add extra protein or calories to meals by adding cream, nut butters, or protein powder to foods and drinks. Drink nutritional supplement shakes as told by your health care provider or dietitian. Drink enough fluid to keep your urine pale yellow. If you drink alcohol: Limit how much you use to: 0-1 drink a day for women who are not pregnant. 0-2 drinks a day for men. Be aware of how much alcohol is in your drink. In the [place] one drink equals one 12 oz bottle of beer (355 mL), one 5 oz glass of wine (148 mL), or one 1 oz glass of hard liquor (44 mL). Take daily vitamin and mineral supplements as told by your health care provider or dietitian. Follow daily calorie and nutrient intake goals as told by your dietitian. What foods are recommended?  Foods that are high in omega-3s (omega-3 fatty acids). Omega-3s are often found in coldwater fish. They are also found in ground flaxseed, walnuts, edamame, and seaweed. It is recommended that adults eat at least 8 oz of fish or other seafood every week. Wild salmon, albacore, tuna, sardines, and farmed trout are among the best sources for omega-3s. Bake or grill fish instead of frying it to avoid unhealthy fats. Foods that contain vitamins C, D, and E. These include: Avocados. Beans. Nuts and seeds. Green leafy vegetables, like spinach and kale. Cruciferous vegetables,   like broccoli or Brussels sprouts. Cherries and berries, especially blackberries and blueberries. Berries have antioxidants and nutrients that support memory function. Whole grains, such as oatmeal, whole-grain cereal, whole-grain bread, brown rice, and barley. Herbs and spices. Cooking with certain herbs and spices, such as  turmeric, can help you absorb vitamins. Foods in the Mediterranean diet, the DASH (Dietary Approaches to Stop Hypertension) diet, or a combination of foods in both plans. These diets recommend that you: Eat green leafy vegetables, nuts, and whole grains every day. Drink one glass of wine a day if appropriate. Eat berries, beans, fish, and poultry two or more times every week. Limit red meats, butter, cheese, sweets, fried foods, and fast food to once a week or less. Healthy breakfast foods, such as whole-grain cereal, low-fat yogurt, berries or vegetables, and nuts. Eating breakfast every day can boost brain function and concentration for people of all ages. What foods are not recommended? Foods that are high in trans fats, including: Fried foods. Snack foods such as potato chips. Pastries like cookies and donuts, especially those that come prepackaged. Foods that are high in saturated fats, including: Processed, precooked, or cured meat, such as sausages or meat loaves. Red meat. Certain dairy products like butter, margarine, and some cheeses. Processed grains, such as white bread. Sweets and fast food. Limit these types of food to once a week or less. Summary Eating a nutritious diet can support brain health as well as overall wellness. Choose foods that are high in omega-3 fatty acids, vitamins, and whole grains. Avoid foods that contain trans fats and saturated fats. Limit sweets and fast food. This information is not intended to replace advice given to you by your health care provider. Make sure you discuss any questions you have with your health care provider. Document Revised: 05/17/2019 Document Reviewed: 05/17/2019 Elsevier Patient Education  2023 Else[employer] Inc.  

## 2022-01-12 NOTE — Progress Notes (Signed)
FOLLOW UP  Assessment:   Diagnoses and all orders for this visit:  Labile hypertension Discussed DASH (Dietary Approaches to Stop Hypertension) DASH diet is lower in sodium than a typical American diet. Cut back on foods that are high in saturated fat, cholesterol, and trans fats. Eat more whole-grain foods, fish, poultry, and nuts Remain active and exercise as tolerated daily.  Monitor BP at home-Call if greater than 130/80.  Check CMP/CBC  Aortic atherosclerosis ([place]) by LS spine XR 11/2018 Control blood pressure Control cholesterol, glucose,  Stay active and increase exercise.   Internal hemorrhoids Stable.  No new events. Treat PRN Continue to monitor.  Allergic rhinitis, unspecified seasonality, unspecified trigger OTC antihistamine PRN Avoid triggers.  Hypothyroidism, unspecified type Continue Synthroid. Continue to monitor.  Stage 3a chronic kidney disease ([place]) Stay well hydrated Avoid NSAIDS Avoid processed meats, salt. Monitor sugars Continue to monitor.  Hyperlipidemia, mixed Continue Simvastatin. Continue low cholesterol diet and exercise.  Continue to monitor.  Vitamin D deficiency Above goal. Continue to monitor.  Major depression in remission (HCC)/Generalized anxiety Stable Continue Valium PRN only Continue stress management Stay well hydrated. Suggest 6-8 hours sleep. i  PMR (polymyalgia rheumatica) (HCC) Stable.  No recurrent sx  Dr. Estanislado Davies following  Medication management All medications reviewed at length. All questions and concerns addressed.  Malodorous urine Check UA Stay well hydrated Monitor for increase in fever, chills, N/V, Notify office if s/s fail to improve.  Need for Pneumonia vaccine Administered Patient tolerated well  Orders Placed This Encounter  Procedures   Urine Culture   Pneumococcal conjugate vaccine 20-valent (Prevnar 20)   CBC with Differential/Platelet   COMPLETE METABOLIC PANEL WITH GFR    Lipid panel   TSH   VITAMIN D 25 Hydroxy (Vit-D Deficiency, Fractures)   Urinalysis, Routine w reflex microscopic   Meds ordered this encounter  Medications   diazepam (VALIUM) 5 MG tablet    Sig: Take 1/2-1 tablet 2 - 3 x /day ONLY if needed for Anxiety Attack &  limit to 5 days /week to avoid addiction    Dispense:  30 tablet    Refill:  0    Order Specific Question:   Supervising Provider    Answer:   Alexandria Davies [phone]    Notify office for further evaluation and treatment, questions or concerns if any reported s/s fail to improve.   The patient was advised to call back or seek an in-person evaluation if any symptoms worsen or if the condition fails to improve as anticipated.   Further disposition pending results of labs. Discussed med's effects and SE's.    I discussed the assessment and treatment plan with the patient. The patient was provided an opportunity to ask questions and all were answered. The patient agreed with the plan and demonstrated an understanding of the instructions.  Discussed med's effects and SE's. Screening labs and tests as requested with regular follow-up as recommended.  I provided 20 minutes of face-to-face time during this encounter including counseling, chart review, and critical decision making was preformed.     Future Appointments  Date Time Provider [place]  03/05/2022  2:00 PM Alexandria Pinto, MD [facility] None  07/07/2022  4:00 PM Alexandria Jump, NP [facility] None    Subjective:  Alexandria Davies is a 88 y.o. female who presents for 6 month follow up.   Overall she reports feeling well.    Saw [name] 12/26/21 for left knee pain.  Noted to have lost 15  lb recently.  She had steroid injection.  She will f/u PRN.  She is no longer taking Gabapentin.  she has a diagnosis of anxiety and has been on valium 5 mg TID PRN. she currently takes very rarely, takes 2-3 a month or so when she cannot sleep. She has been  more stressed out having to care for her husband who had colon surgery.    She was diagnosed with PMR, follows with Dr. Duke Davies has successfully tapered off of prednisone without recurrent sx.   She has noticed increase in malodorous urine.  Denies frequency, dysuria, fever, chills, N/V.   BMI is Body mass index is 21.17 kg/m., she has not been working on diet,  Wt Readings from Last 3 Encounters:  01/12/22 130 lb 3.2 oz (59.1 kg)  09/11/21 128 lb (58.1 kg)  08/25/21 128 lb (58.1 kg)   She does not currently check BP at home, today their BP is BP: (!) 144/70 S  She does not workout. She denies chest pain, shortness of breath, dizziness.  She has aortic atherosclerosis per CXR 11/2018.    She is on cholesterol medication (simvastatin 20 mg daily) and denies myalgias. Her cholesterol is at goal. The cholesterol last visit was:   Lab Results  Component Value Date   CHOL 158 07/16/2021   HDL 62 07/16/2021   LDLCALC 78 07/16/2021   TRIG 93 07/16/2021   CHOLHDL 2.5 07/16/2021   She has been working on diet and exercise for glucose management, and denies foot ulcerations, increased appetite, nausea, paresthesia of the feet, polydipsia, polyuria, visual disturbances, vomiting and weight loss. Last A1C in the office was:  Lab Results  Component Value Date   HGBA1C 5.4 02/27/2021   She is on thyroid medication. Her medication was not changed last visit. Synthroid 1 tab (150 mcg on Wednesday, and 1/2 tab all other days).   Lab Results  Component Value Date   TSH 0.22 (L) 07/16/2021   She has CKD IIIa monitored at this office. Last GFR: Lab Results  Component Value Date   GFRNONAA 60 08/13/2020   Patient is on Vitamin D supplement.  Elevated last check.  She has held off talking the medication. Lab Results  Component Value Date   VD25OH 90 07/16/2021      Medication Review: Current Outpatient Medications on File Prior to Visit  Medication Sig Dispense Refill   aspirin  (ASPIRIN 81) 81 MG EC tablet Take 1 tablet daily     Cholecalciferol (VITAMIN D PO) Take 5,000 Units by mouth 2 (two) times daily.     levothyroxine (SYNTHROID) 75 MCG tablet Take  1 tablet  Daily  on an empty stomach with only water for 30 minutes & no Antacid meds, Calcium or Magnesium for 4 hours & avoid Biotin 90 tablet 3   mirtazapine (REMERON) 30 MG tablet Take  1 tablet  1 hour  before  Bedtime for Appetite & Sleep 90 tablet 1   Probiotic Product (PROBIOTIC PO) Take 1 tablet by mouth daily.     simvastatin (ZOCOR) 20 MG tablet TAKE 1 TABLET AT BEDTIME   FOR CHOLESTEROL 90 tablet 3   clotrimazole-betamethasone (LOTRISONE) cream Apply 1 application topically 2 (two) times daily. (Patient not taking: Reported on 08/25/2021) 30 g 1   gabapentin (NEURONTIN) 300 MG capsule Take  1 capsule  3 x /day  for Neuropathy pain (Patient not taking: Reported on 08/05/2021) 270 capsule 0   pregabalin (LYRICA) 50 MG capsule Take 50  mg by mouth 3 (three) times daily. (Patient not taking: Reported on 08/05/2021)     No current facility-administered medications on file prior to visit.    Allergies  Allergen Reactions   Ciprofloxacin    Epinephrine Other (See Comments)    Heart raced    Etodolac Hives   Lyrica [Pregabalin]    Synephrine     Palpitations    Flagyl [Metronidazole Hcl] Rash    Current Problems (verified) Patient Active Problem List   Diagnosis Date Noted   Aortic atherosclerosis ([place]) by LS spine XR 11/2018 02/06/2020   Major depression in remission ([place]) 10/16/2019   CKD (chronic kidney disease) stage 3, GFR 30-59 ml/min (HCC) 07/15/2019   PMR (polymyalgia rheumatica) ([place]) 10/26/2018   Insomnia 10/22/2014   Generalized anxiety disorder 10/22/2014   Labile hypertension 02/14/2014   Hyperlipidemia, mixed 02/14/2014   Vitamin D deficiency 02/14/2014   Rhinitis, allergic 08/19/2011   Diverticulosis 04/23/2011   Colon polyps 04/23/2011   PERSONAL HISTORY OF MALIGNANT NEOPLASM OF BREAST  12/07/2007   Hypothyroidism 12/05/2007   Osteoarthritis 12/05/2007   Internal hemorrhoids 11/08/2002    Screening Tests Immunization History  Administered Date(s) Administered   Influenza Split 12/01/2010   Influenza, High Dose Seasonal PF 11/12/2015, 11/23/2016, 12/01/2017, 12/06/2018, 12/12/2019, 12/03/2020   Influenza-Unspecified 10/31/2012, 11/29/2014   PFIZER(Purple Top)SARS-COV-2 Vaccination 05/13/2019, 06/06/2019   Pneumococcal Conjugate-13 10/18/2013   Pneumococcal Polysaccharide-23 03/20/2010   Td 10/17/2012, 01/12/2020     Patient Care Team: Alexandria Pinto, MD as PCP - General (Internal Medicine) Melrose Nakayama, MD as Consulting Physician (Orthopedic Surgery) Deneise Lever, MD as Consulting Physician (Pulmonary Disease) Inda Castle, MD (Inactive) as Consulting Physician (Gastroenterology) Carolan Clines, MD (Inactive) as Consulting Physician (Urology) Carleene Mains, [facility] as Pharmacist (Pharmacist)  SURGICAL HISTORY She  has a past surgical history that includes Abdominal hysterectomy; bladder tac; Bunionectomy; right shoulder (2003); Wrist fracture surgery (2003); Cystocele repair (2009); Tonsillectomy; Knee arthroscopy (Left, 01/17/2013); Colonoscopy (2009); Eye surgery (Bilateral); Total knee arthroplasty (Right, 11/26/2015); and Breast lumpectomy (2004). FAMILY HISTORY Her family history includes Alzheimer's disease in her mother and sister; Breast cancer in her niece, niece, and sister; Breast cancer (age of onset: 45) in her daughter; Cancer (age of onset: 33) in her sister; Cancer (age of onset: 55) in her sister; Colon cancer in her son; Heart disease in her mother; Testicular cancer in her father. SOCIAL HISTORY She  reports that she has never smoked. She has never used smokeless tobacco. She reports current alcohol use. She reports that she does not use drugs.   Objective:     Today's Vitals   01/12/22 1437  BP: (!) 144/70  Pulse: 69  Resp:  16  Temp: 97.8 F (36.6 C)  SpO2: 97%  Weight: 130 lb 3.2 oz (59.1 kg)  Height: 5' 5.75" (1.67 m)   Body mass index is 21.17 kg/m.  General appearance: alert, no distress, WD/WN, female HEENT: normocephalic, sclerae anicteric, TMs pearly, nares patent, no discharge or erythema, pharynx normal Oral cavity: MMM, no lesions Neck: supple, no lymphadenopathy, no thyromegaly, no masses Heart: RRR, normal S1, S2, no murmurs Lungs: CTA bilaterally, no wheezes, rhonchi, or rales Abdomen: +bs, soft, non tender, non distended, no masses, no hepatomegaly, no splenomegaly Musculoskeletal: nontender, no swelling, no obvious deformity Extremities: no edema, no cyanosis, no clubbing Pulses: 2+ symmetric, upper and lower extremities, normal cap refill Neurological: alert, oriented x 3, CN2-12 intact, strength normal upper extremities and lower extremities, sensation normal throughout, DTRs  2+ throughout, no cerebellar signs, gait normal Psychiatric: normal affect, behavior normal, pleasant     Alexandria Jump, NP   01/12/2022

## 2022-01-13 LAB — COMPLETE METABOLIC PANEL WITH GFR
AG Ratio: 1.9 (calc) (ref 1.0–2.5)
ALT: 9 U/L (ref 6–29)
AST: 15 U/L (ref 10–35)
Albumin: 4.4 g/dL (ref 3.6–5.1)
Alkaline phosphatase (APISO): 73 U/L (ref 37–153)
BUN: 15 mg/dL (ref 7–25)
CO2: 26 mmol/L (ref 20–32)
Calcium: 9.9 mg/dL (ref 8.6–10.4)
Chloride: 105 mmol/L (ref 98–110)
Creat: 0.77 mg/dL (ref 0.60–0.95)
Globulin: 2.3 g/dL (calc) (ref 1.9–3.7)
Glucose, Bld: 92 mg/dL (ref 65–99)
Potassium: 4 mmol/L (ref 3.5–5.3)
Sodium: 140 mmol/L (ref 135–146)
Total Bilirubin: 1.2 mg/dL (ref 0.2–1.2)
Total Protein: 6.7 g/dL (ref 6.1–8.1)
eGFR: 71 mL/min/{1.73_m2} (ref >=60)

## 2022-01-13 LAB — URINE CULTURE
MICRO NUMBER:: 14180930
Result:: NO GROWTH
SPECIMEN QUALITY:: ADEQUATE

## 2022-01-13 LAB — URINALYSIS, ROUTINE W REFLEX MICROSCOPIC
Bilirubin Urine: NEGATIVE
Glucose, UA: NEGATIVE
Hgb urine dipstick: NEGATIVE
Ketones, ur: NEGATIVE
Leukocytes,Ua: NEGATIVE
Nitrite: NEGATIVE
Protein, ur: NEGATIVE
Specific Gravity, Urine: 1.006 (ref 1.001–1.035)
pH: 5.5 (ref 5.0–8.0)

## 2022-01-13 LAB — CBC WITH DIFFERENTIAL/PLATELET
Absolute Monocytes: 548 cells/uL (ref 200–950)
Basophils Absolute: 37 cells/uL (ref 0–200)
Basophils Relative: 0.5 %
Eosinophils Absolute: 59 cells/uL (ref 15–500)
Eosinophils Relative: 0.8 %
HCT: 39 % (ref 35.0–45.0)
Hemoglobin: 13.2 g/dL (ref 11.7–15.5)
Lymphs Abs: 2176 cells/uL (ref 850–3900)
MCH: 29.7 pg (ref 27.0–33.0)
MCHC: 33.8 g/dL (ref 32.0–36.0)
MCV: 87.8 fL (ref 80.0–100.0)
MPV: 9.8 fL (ref 7.5–12.5)
Monocytes Relative: 7.4 %
Neutro Abs: 4581 cells/uL (ref 1500–7800)
Neutrophils Relative %: 61.9 %
Platelets: 249 10*3/uL (ref 140–400)
RBC: 4.44 10*6/uL (ref 3.80–5.10)
RDW: 13 % (ref 11.0–15.0)
Total Lymphocyte: 29.4 %
WBC: 7.4 10*3/uL (ref 3.8–10.8)

## 2022-01-13 LAB — LIPID PANEL
Cholesterol: 176 mg/dL (ref <200)
HDL: 70 mg/dL (ref >=50)
LDL Cholesterol (Calc): 84 mg/dL (calc)
Non-HDL Cholesterol (Calc): 106 mg/dL (calc) (ref <130)
Total CHOL/HDL Ratio: 2.5 (calc) (ref <5.0)
Triglycerides: 121 mg/dL (ref <150)

## 2022-01-13 LAB — TSH: TSH: 1.87 mIU/L (ref 0.40–4.50)

## 2022-01-13 LAB — VITAMIN D 25 HYDROXY (VIT D DEFICIENCY, FRACTURES): Vit D, 25-Hydroxy: 74 ng/mL (ref 30–100)

## 2022-01-21 ENCOUNTER — Ambulatory Visit: Payer: Medicare HMO | Admitting: Nurse Practitioner

## 2022-01-29 ENCOUNTER — Ambulatory Visit: Payer: Medicare HMO | Admitting: Nurse Practitioner

## 2022-02-02 ENCOUNTER — Other Ambulatory Visit: Payer: Self-pay | Admitting: Internal Medicine

## 2022-02-02 DIAGNOSIS — E039 Hypothyroidism, unspecified: Secondary | ICD-10-CM

## 2022-02-07 NOTE — Progress Notes (Unsigned)
Assessment and Plan:  There are no diagnoses linked to this encounter.    Further disposition pending results of labs. Discussed med's effects and SE's.   Over 30 minutes of exam, counseling, chart review, and critical decision making was performed.   Future Appointments  Date Time Provider [place]  02/09/2022 10:45 AM Alycia Rossetti, NP [facility] None  05/04/2022  2:00 PM Unk Pinto, MD [facility] None  07/07/2022  4:00 PM Darrol Jump, NP [facility] None    ------------------------------------------------------------------------------------------------------------------   HPI There were no vitals taken for this visit. 88 y.o.female presents for  Past Medical History:  Diagnosis Date   Anxiety    Breast cancer ([place])    left   Colon polyp 2009   BENIGN POLYPOID   Diverticulosis of colon (without mention of hemorrhage) 2009   DJD (degenerative joint disease)    Family history of malignant neoplasm of gastrointestinal tract 05/06/2011   HOH (hard of hearing)    Right ear   Hypercholesteremia    Hypothyroidism    IBS (irritable bowel syndrome)    Internal hemorrhoids without mention of complication    Primary osteoarthritis of right knee 11/26/2015   S/p TKA   Sinus drainage    Vitiligo    Wears dentures    top   Wears glasses      Allergies  Allergen Reactions   Ciprofloxacin    Epinephrine Other (See Comments)    Heart raced    Etodolac Hives   Lyrica [Pregabalin]    Synephrine     Palpitations    Flagyl [Metronidazole Hcl] Rash    Current Outpatient Medications on File Prior to Visit  Medication Sig   aspirin (ASPIRIN 81) 81 MG EC tablet Take 1 tablet daily   Cholecalciferol (VITAMIN D PO) Take 5,000 Units by mouth 2 (two) times daily.   clotrimazole-betamethasone (LOTRISONE) cream Apply 1 application topically 2 (two) times daily. (Patient not taking: Reported on 08/25/2021)   diazepam (VALIUM) 5 MG tablet Take 1/2-1 tablet 2 - 3 x  /day ONLY if needed for Anxiety Attack &  limit to 5 days /week to avoid addiction   gabapentin (NEURONTIN) 300 MG capsule Take  1 capsule  3 x /day  for Neuropathy pain (Patient not taking: Reported on 08/05/2021)   levothyroxine (SYNTHROID) 75 MCG tablet Take  1 tablet  Daily  on an empty stomach with only water for 30 minutes & no Antacid meds, Calcium or Magnesium for 4 hours & avoid Biotin   mirtazapine (REMERON) 30 MG tablet Take  1 tablet  1 hour  before  Bedtime for Appetite & Sleep   pregabalin (LYRICA) 50 MG capsule Take 50 mg by mouth 3 (three) times daily. (Patient not taking: Reported on 08/05/2021)   Probiotic Product (PROBIOTIC PO) Take 1 tablet by mouth daily.   simvastatin (ZOCOR) 20 MG tablet TAKE 1 TABLET AT BEDTIME   FOR CHOLESTEROL   No current facility-administered medications on file prior to visit.    ROS: all negative except above.   Physical Exam:  There were no vitals taken for this visit.  General Appearance: Well nourished, in no apparent distress. Eyes: PERRLA, EOMs, conjunctiva no swelling or erythema Sinuses: No Frontal/maxillary tenderness ENT/Mouth: Ext aud canals clear, TMs without erythema, bulging. No erythema, swelling, or exudate on post pharynx.  Tonsils not swollen or erythematous. Hearing normal.  Neck: Supple, thyroid normal.  Respiratory: Respiratory effort normal, BS equal bilaterally without rales, rhonchi, wheezing or stridor.  Cardio: RRR with no MRGs. Brisk peripheral pulses without edema.  Abdomen: Soft, + BS.  Non tender, no guarding, rebound, hernias, masses. Lymphatics: Non tender without lymphadenopathy.  Musculoskeletal: Full ROM, 5/5 strength, normal gait.  Skin: Warm, dry without rashes, lesions, ecchymosis.  Neuro: Cranial nerves intact. Normal muscle tone, no cerebellar symptoms. Sensation intact.  Psych: Awake and oriented X 3, normal affect, Insight and Judgment appropriate.     Alycia Rossetti, NP 11:14 AM [facility]

## 2022-02-09 ENCOUNTER — Encounter: Payer: Self-pay | Admitting: Nurse Practitioner

## 2022-02-09 ENCOUNTER — Ambulatory Visit (INDEPENDENT_AMBULATORY_CARE_PROVIDER_SITE_OTHER): Payer: Medicare HMO | Admitting: Nurse Practitioner

## 2022-02-09 VITALS — BP 128/68 | HR 62 | Temp 97.1°F | Wt 131.6 lb

## 2022-02-09 DIAGNOSIS — R21 Rash and other nonspecific skin eruption: Secondary | ICD-10-CM | POA: Diagnosis not present

## 2022-02-09 DIAGNOSIS — R8290 Unspecified abnormal findings in urine: Secondary | ICD-10-CM | POA: Diagnosis not present

## 2022-02-09 DIAGNOSIS — L720 Epidermal cyst: Secondary | ICD-10-CM | POA: Diagnosis not present

## 2022-02-09 MED ORDER — CLOTRIMAZOLE-BETAMETHASONE 1-0.05 % EX CREA: 1.0000 | TOPICAL_CREAM | Freq: Two times a day (BID) | CUTANEOUS | 1 refills | Status: AC

## 2022-02-09 NOTE — Patient Instructions (Signed)
Incision and Drainage, Care After This sheet gives you information about how to care for yourself after your procedure. Your health care provider may also give you more specific instructions. If you have problems or questions, contact your health care provider. What can I expect after the procedure? After the procedure, it is common to have: Pain or discomfort around the incision site. Blood, fluid, or pus (drainage) from the incision. Redness and firm skin around the incision site. Follow these instructions at home: Medicines Take over-the-counter and prescription medicines only as told by your health care provider. If you were prescribed an antibiotic medicine, use or take it as told by your health care provider. Do not stop using the antibiotic even if you start to feel better. Wound care Follow instructions from your health care provider about how to take care of your wound. Make sure you: Wash your hands with soap and water before and after you change your bandage (dressing). If soap and water are not available, use hand sanitizer. Change your dressing and packing as told by your health care provider. If your dressing is dry or stuck when you try to remove it, moisten or wet the dressing with saline or water so that it can be removed without harming your skin or tissues. If your wound is packed, leave it in place until your health care provider tells you to remove it. To remove the packing, moisten or wet the packing with saline or water so that it can be removed without harming your skin or tissues. Leave stitches (sutures), skin glue, or adhesive strips in place. These skin closures may need to stay in place for 2 weeks or longer. If adhesive strip edges start to loosen and curl up, you may trim the loose edges. Do not remove adhesive strips completely unless your health care provider tells you to do that. Check your wound every day for signs of infection. Check for: More redness, swelling,  or pain. More fluid or blood. Warmth. Pus or a bad smell. If you were sent home with a drain tube in place, follow instructions from your health care provider about: How to empty it. How to care for it at home.  General instructions Rest the affected area. Do not take baths, swim, or use a hot tub until your health care provider approves. Ask your health care provider if you may take showers. You may only be allowed to take sponge baths. Return to your normal activities as told by your health care provider. Ask your health care provider what activities are safe for you. Your health care provider may put you on activity or lifting restrictions. The incision will continue to drain. It is normal to have some clear or slightly bloody drainage. The amount of drainage should lessen each day. Do not apply any creams, ointments, or liquids unless you have been told to by your health care provider. Keep all follow-up visits as told by your health care provider. This is important. Contact a health care provider if: Your cyst or abscess returns. You have more redness, swelling, or pain around your incision. You have more fluid or blood coming from your incision. Your incision feels warm to the touch. You have pus or a bad smell coming from your incision. You have red streaks above or below the incision site. Get help right away if: You have severe pain or bleeding. You cannot eat or drink without vomiting. You have a fever or chills. You have redness that spreads  quickly. You have decreased urine output. You become short of breath. You have chest pain. You cough up blood. The affected area becomes numb or starts to tingle. These symptoms may represent a serious problem that is an emergency. Do not wait to see if the symptoms will go away. Get medical help right away. Call your local emergency services (911 in the [place] Do not drive yourself to the hospital. Summary After this procedure, it is  common to have fluid, blood, or pus coming from the surgery site. Follow all home care instructions. You will be told how to take care of your incision, how to check for infection, and how to take medicines. If you were prescribed an antibiotic medicine, take it as told by your health care provider. Do not stop taking the antibiotic even if you start to feel better. Contact a health care provider if you have increased redness, swelling, or pain around your incision. Get help right away if you have chest pain, you vomit, you cough up blood, or you have shortness of breath. Keep all follow-up visits as told by your health care provider. This is important. This information is not intended to replace advice given to you by your health care provider. Make sure you discuss any questions you have with your health care provider. Document Revised: 05/22/2021 Document Reviewed: 11/28/2020 Elsevier Patient Education  [place].

## 2022-02-10 LAB — URINALYSIS, ROUTINE W REFLEX MICROSCOPIC
Bilirubin Urine: NEGATIVE
Glucose, UA: NEGATIVE
Hgb urine dipstick: NEGATIVE
Ketones, ur: NEGATIVE
Leukocytes,Ua: NEGATIVE
Nitrite: NEGATIVE
Protein, ur: NEGATIVE
Specific Gravity, Urine: 1.007 (ref 1.001–1.035)
pH: 5.5 (ref 5.0–8.0)

## 2022-02-10 LAB — URINE CULTURE
MICRO NUMBER:: 14297452
Result:: NO GROWTH
SPECIMEN QUALITY:: ADEQUATE

## 2022-02-13 ENCOUNTER — Encounter: Payer: Self-pay | Admitting: Internal Medicine

## 2022-02-16 ENCOUNTER — Encounter: Payer: Self-pay | Admitting: Internal Medicine

## 2022-02-16 DIAGNOSIS — M1712 Unilateral primary osteoarthritis, left knee: Secondary | ICD-10-CM | POA: Diagnosis not present

## 2022-03-05 ENCOUNTER — Encounter: Payer: Medicare HMO | Admitting: Internal Medicine

## 2022-04-01 DIAGNOSIS — Z961 Presence of intraocular lens: Secondary | ICD-10-CM | POA: Diagnosis not present

## 2022-04-01 DIAGNOSIS — H0102A Squamous blepharitis right eye, upper and lower eyelids: Secondary | ICD-10-CM | POA: Diagnosis not present

## 2022-04-01 DIAGNOSIS — H26493 Other secondary cataract, bilateral: Secondary | ICD-10-CM | POA: Diagnosis not present

## 2022-04-01 DIAGNOSIS — H532 Diplopia: Secondary | ICD-10-CM | POA: Diagnosis not present

## 2022-04-01 DIAGNOSIS — H43393 Other vitreous opacities, bilateral: Secondary | ICD-10-CM | POA: Diagnosis not present

## 2022-04-01 DIAGNOSIS — Z0100 Encounter for examination of eyes and vision without abnormal findings: Secondary | ICD-10-CM | POA: Diagnosis not present

## 2022-04-01 DIAGNOSIS — H04123 Dry eye syndrome of bilateral lacrimal glands: Secondary | ICD-10-CM | POA: Diagnosis not present

## 2022-04-01 DIAGNOSIS — H0102B Squamous blepharitis left eye, upper and lower eyelids: Secondary | ICD-10-CM | POA: Diagnosis not present

## 2022-04-01 DIAGNOSIS — H5051 Esophoria: Secondary | ICD-10-CM | POA: Diagnosis not present

## 2022-04-01 DIAGNOSIS — D3131 Benign neoplasm of right choroid: Secondary | ICD-10-CM | POA: Diagnosis not present

## 2022-04-01 DIAGNOSIS — H1045 Other chronic allergic conjunctivitis: Secondary | ICD-10-CM | POA: Diagnosis not present

## 2022-04-01 DIAGNOSIS — D3132 Benign neoplasm of left choroid: Secondary | ICD-10-CM | POA: Diagnosis not present

## 2022-04-09 DIAGNOSIS — M1712 Unilateral primary osteoarthritis, left knee: Secondary | ICD-10-CM | POA: Diagnosis not present

## 2022-04-09 DIAGNOSIS — M25662 Stiffness of left knee, not elsewhere classified: Secondary | ICD-10-CM | POA: Diagnosis not present

## 2022-04-09 DIAGNOSIS — R262 Difficulty in walking, not elsewhere classified: Secondary | ICD-10-CM | POA: Diagnosis not present

## 2022-04-09 DIAGNOSIS — M25562 Pain in left knee: Secondary | ICD-10-CM | POA: Diagnosis not present

## 2022-04-16 DIAGNOSIS — M25562 Pain in left knee: Secondary | ICD-10-CM | POA: Diagnosis not present

## 2022-04-16 DIAGNOSIS — M25662 Stiffness of left knee, not elsewhere classified: Secondary | ICD-10-CM | POA: Diagnosis not present

## 2022-04-16 DIAGNOSIS — M1712 Unilateral primary osteoarthritis, left knee: Secondary | ICD-10-CM | POA: Diagnosis not present

## 2022-04-16 DIAGNOSIS — R262 Difficulty in walking, not elsewhere classified: Secondary | ICD-10-CM | POA: Diagnosis not present

## 2022-04-23 DIAGNOSIS — M1712 Unilateral primary osteoarthritis, left knee: Secondary | ICD-10-CM | POA: Diagnosis not present

## 2022-04-23 DIAGNOSIS — M25562 Pain in left knee: Secondary | ICD-10-CM | POA: Diagnosis not present

## 2022-04-23 DIAGNOSIS — M25662 Stiffness of left knee, not elsewhere classified: Secondary | ICD-10-CM | POA: Diagnosis not present

## 2022-04-23 DIAGNOSIS — R262 Difficulty in walking, not elsewhere classified: Secondary | ICD-10-CM | POA: Diagnosis not present

## 2022-04-30 ENCOUNTER — Encounter: Payer: Medicare HMO | Admitting: Internal Medicine

## 2022-04-30 DIAGNOSIS — M25562 Pain in left knee: Secondary | ICD-10-CM | POA: Diagnosis not present

## 2022-04-30 DIAGNOSIS — R262 Difficulty in walking, not elsewhere classified: Secondary | ICD-10-CM | POA: Diagnosis not present

## 2022-04-30 DIAGNOSIS — M1712 Unilateral primary osteoarthritis, left knee: Secondary | ICD-10-CM | POA: Diagnosis not present

## 2022-04-30 DIAGNOSIS — M25662 Stiffness of left knee, not elsewhere classified: Secondary | ICD-10-CM | POA: Diagnosis not present

## 2022-05-03 ENCOUNTER — Encounter: Payer: Self-pay | Admitting: Internal Medicine

## 2022-05-03 NOTE — Patient Instructions (Signed)

## 2022-05-03 NOTE — Progress Notes (Unsigned)
Annual Screening/Preventative Visit & Comprehensive Evaluation &  Examination  Future Appointments  Date Time Provider Department  05/04/2022  2:00 PM Unk Pinto, MD [facility]  07/07/2022  4:00 PM Darrol Jump, NP [facility]  05/06/2023  2:00 PM Unk Pinto, MD [facility]        This very nice 89 y.o. [name]  presents for a Screening /Preventative Visit & comprehensive evaluation and management of multiple medical co-morbidities.  Patient has been followed for HTN, HLD, Prediabetes,  Hypothyroidism,  IBS-D and Vitamin D Deficiency.    LS spine XR in Sept 2020  showed  Aortic atherosclerosis .                                          Patient also has hx/o PMR  (Oct 2020) a treated with steroids thru Oct 2021  by Dr Estanislado Pandy.           Patient has been followed expectantly for mild labile HTN .   She does have CKD3a attributed to her HTN Patient's BP has been controlled at home and patient denies any cardiac symptoms as chest pain, palpitations, shortness of breath, dizziness or ankle swelling. Today's BP is at goal -  138/80.         Patient's hyperlipidemia is controlled with diet & Simvastatin. Patient denies myalgias or other medication SE's. Last lipids were at goal :     Lab Results  Component Value Date   CHOL 176 01/12/2022   HDL 70 01/12/2022   LDLCALC 84 01/12/2022   TRIG 121 01/12/2022   CHOLHDL 2.5 01/12/2022         Patient has also been followed proactively for glucose intolerance  and patient denies reactive hypoglycemic symptoms, visual blurring, diabetic polys or paresthesias. Last A1c was normal & at goal :  Lab Results  Component Value Date   HGBA1C 5.4 02/27/2021                                             Patient was dx'd Hypothyroid in 2000 & has been on thyroid replacement since then.         Finally, patient has history of Vitamin D Deficiency  ("37" /2011) and last Vitamin D was borderline high goal range :  Lab Results  Component  Value Date   VD25OH 103 (H) 08/13/2020     Current Outpatient Medications on File Prior to Visit  Medication Sig   ASPIRIN 81  EC  Take 1 tablet daily   VITAMIN D   5,000 Units Take   2  times daily.   LOTRISONE  cream  Apply  topically as needed.   diazepam  5 MG tablet Take 1/2-1 tablet 2 - 3 x /day ONLY if needed for Anxiety    levothyroxine  150 MCG tablet Take 1/2-1 tablet daily    mirtazapine 15 MG tablet Take  1 tablet  1 hour  before  Bedtime for Appetite & Sleep   Probiotic  Take 1 tablet  daily.   simvastatin 20 MG tablet Take one tablet at bedtime for Cholesterol.    Allergies  Allergen Reactions   Ciprofloxacin    Epinephrine Other (See Comments)    Heart raced    Etodolac Hives  Synephrine     Palpitations    Flagyl [Metronidazole Hcl] Rash     Past Medical History:  Diagnosis Date   Anxiety    Breast cancer ([place])    left   Colon polyp 2009   BENIGN POLYPOID   Diverticulosis of colon (without mention of hemorrhage) 2009   DJD (degenerative joint disease)    Family history of malignant neoplasm of gastrointestinal tract 05/06/2011   HOH (hard of hearing)    Right ear   Hypercholesteremia    Hypothyroidism    IBS (irritable bowel syndrome)    Internal hemorrhoids without mention of complication    Primary osteoarthritis of right knee 11/26/2015   S/p TKA   Sinus drainage    Vitiligo    Wears dentures    top   Wears glasses      Health Maintenance  Topic Date Due   Zoster Vaccines- Shingrix (1 of 2) Never done   COVID-19 Vaccine (3 - Pfizer risk series) 07/04/2019   TETANUS/TDAP  01/11/2030   Pneumonia Vaccine 92+ Years old  Completed   INFLUENZA VACCINE  Completed   DEXA SCAN  Completed   HPV VACCINES  Aged Out     Immunization History  Administered Date(s) Administered   Influenza Split 12/01/2010   Influenza, High Dose  12/01/2017, 12/06/2018, 12/12/2019, 12/03/2020   Influenza 10/31/2012, 11/29/2014   PFIZER SARS-COV-2 Vacc  05/13/2019, 06/06/2019   Pneumococcal - 13 10/18/2013   Pneumococcal - 23 03/20/2010   Td 10/17/2012, 01/12/2020   Last Colon -  2009 - No f/u due to age   Last MGM - 09/05/2020  Past Surgical History:  Procedure Laterality Date   ABDOMINAL HYSTERECTOMY     bladder tac     BREAST LUMPECTOMY  2004   left lump-snbx   BUNIONECTOMY     bilateral   COLONOSCOPY  2009   several   CYSTOCELE REPAIR  2009   EYE SURGERY Bilateral    Cataract removal   KNEE ARTHROSCOPY Left 01/17/2013   Procedure: ARTHROSCOPY LEFT KNEE;  Surgeon: Hessie Dibble, MD;  Location: [facility];  Service: Orthopedics;  Laterality: Left;  partial medial and partial lateral chondroplasty and removal loose body   right shoulder  2003   bone spur removed-rcr   TONSILLECTOMY     TOTAL KNEE ARTHROPLASTY Right 11/26/2015   Procedure: TOTAL KNEE ARTHROPLASTY;  Surgeon: Melrose Nakayama, MD;  Location: [facility];  Service: Orthopedics;  Laterality: Right;   WRIST FRACTURE SURGERY  2003   left-lipoma     Family History  Problem Relation Age of Onset   Alzheimer's disease Sister    Cancer Sister 106       breast   Breast cancer Sister    Cancer Sister 55       breast cancer   Colon cancer Son    Heart disease Mother        died at age 31   Alzheimer's disease Mother    Testicular cancer Father     Social History   Tobacco Use   Smoking status: Never   Smokeless tobacco: Never  Vaping Use   Vaping Use: Never used  Substance Use Topics   Alcohol use: Yes    Comment: ocassionally   Drug use: No      ROS Constitutional: Denies fever, chills, weight loss/gain, headaches, insomnia,  night sweats, and change in appetite. Does c/o fatigue. Eyes: Denies redness, blurred vision, diplopia, discharge, itchy, watery eyes.  ENT: Denies discharge, congestion, post nasal drip, epistaxis, sore throat, earache, hearing loss, dental pain, Tinnitus, Vertigo, Sinus pain, snoring.  Cardio: Denies chest pain,  palpitations, irregular heartbeat, syncope, dyspnea, diaphoresis, orthopnea, PND, claudication, edema Respiratory: denies cough, dyspnea, DOE, pleurisy, hoarseness, laryngitis, wheezing.  Gastrointestinal: Denies dysphagia, heartburn, reflux, water brash, pain, cramps, nausea, vomiting, bloating, diarrhea, constipation, hematemesis, melena, hematochezia, jaundice, hemorrhoids Genitourinary: Denies dysuria, frequency, urgency, nocturia, hesitancy, discharge, hematuria, flank pain Breast: Breast lumps, nipple discharge, bleeding.  Musculoskeletal: Denies arthralgia, myalgia, stiffness, Jt. Swelling, pain, limp, and strain/sprain. Denies falls. Skin: Denies puritis, rash, hives, warts, acne, eczema, changing in skin lesion Neuro: No weakness, tremor, incoordination, spasms, paresthesia, pain Psychiatric: Denies confusion, memory loss, sensory loss. Denies Depression. Endocrine: Denies change in weight, skin, hair change, nocturia, and paresthesia, diabetic polys, visual blurring, hyper / hypo glycemic episodes.  Heme/Lymph: No excessive bleeding, bruising, enlarged lymph nodes.  Physical Exam  There were no vitals taken for this visit.  General Appearance: Well nourished, well groomed and in no apparent distress.  Eyes: PERRLA, EOMs, conjunctiva no swelling or erythema, normal fundi and vessels. Sinuses: No frontal/maxillary tenderness ENT/Mouth: EACs patent / TMs  nl. Nares clear without erythema, swelling, mucoid exudates. Oral hygiene is good. No erythema, swelling, or exudate. Tongue normal, non-obstructing. Tonsils not swollen or erythematous. Hearing normal.  Neck: Supple, thyroid not palpable. No bruits, nodes or JVD. Respiratory: Respiratory effort normal.  BS equal and clear bilateral without rales, rhonci, wheezing or stridor. Cardio: Heart sounds are normal with regular rate and rhythm and no murmurs, rubs or gallops. Peripheral pulses are normal and equal bilaterally without edema. No  aortic or femoral bruits. Chest: symmetric with normal excursions and percussion. Breasts: Symmetric, without lumps, nipple discharge, retractions, or fibrocystic changes.  Abdomen: Flat, soft with bowel sounds active. Nontender, no guarding, rebound, hernias, masses, or organomegaly.  Lymphatics: Non tender without lymphadenopathy.  Genitourinary:  Musculoskeletal: Full ROM all peripheral extremities, joint stability, 5/5 strength, and normal gait. Skin: Warm and dry without rashes, lesions, cyanosis, clubbing or  ecchymosis.  Neuro: Cranial nerves intact, reflexes equal bilaterally. Normal muscle tone, no cerebellar symptoms. Sensation intact.  Pysch: Alert and oriented X 3, normal affect, Insight and Judgment appropriate.    Assessment and Plan  1. Annual Preventative Screening Examination  2. Labile hypertension  - EKG 12-Lead - Urinalysis, Routine w reflex microscopic - Microalbumin / creatinine urine ratio - CBC with Differential/Platelet - COMPLETE METABOLIC PANEL WITH GFR - Magnesium - TSH  3. Hyperlipidemia, mixed  - EKG 12-Lead - Lipid panel - TSH  4. Abnormal glucose  - EKG 12-Lead - Hemoglobin A1c - Insulin, random  5. Vitamin D deficiency  - VITAMIN D 25 Hydroxy   6. Hypothyroidism  - TSH  7. Aortic atherosclerosis ([place]) by LS spine XR 11/2018  - EKG 12-Lead - Lipid panel  8. Stage 3a chronic kidney disease (HCC)  - Urinalysis, Routine w reflex microscopic - Microalbumin / creatinine urine ratio - PTH, intact and calcium - COMPLETE METABOLIC PANEL WITH GFR  9. PMR (polymyalgia rheumatica) (HCC)  - Sedimentation rate - C-reactive protein  10. Personal history of malignant neoplasm of breast   11. Screening for colorectal cancer  - POC Hemoccult Bld/Stl   12. Screening for ischemic heart disease  - EKG 12-Lead  13. FHx: heart disease  - EKG 12-Lead  14. Medication management  - Urinalysis, Routine w reflex microscopic -  Microalbumin / creatinine urine ratio - CBC with Differential/Platelet -  COMPLETE METABOLIC PANEL WITH GFR - Magnesium - Lipid panel - TSH - Hemoglobin A1c - Insulin, random - VITAMIN D 25 Hydroxy  - Sedimentation rate - C-reactive protein        Patient was counseled in prudent diet to achieve/maintain BMI less than 25 for weight control, BP monitoring, regular exercise and medications. Discussed med's effects and SE's. Screening labs and tests as requested with regular follow-up as recommended. Over 40 minutes of exam, counseling, chart review and high complex critical decision making was performed.   Kirtland Bouchard, MD.

## 2022-05-04 ENCOUNTER — Encounter: Payer: Self-pay | Admitting: Internal Medicine

## 2022-05-04 ENCOUNTER — Ambulatory Visit (INDEPENDENT_AMBULATORY_CARE_PROVIDER_SITE_OTHER): Payer: Medicare HMO | Admitting: Internal Medicine

## 2022-05-04 VITALS — BP 130/74 | HR 80 | Temp 97.9°F | Resp 16 | Ht 65.75 in | Wt 132.2 lb

## 2022-05-04 DIAGNOSIS — Z1211 Encounter for screening for malignant neoplasm of colon: Secondary | ICD-10-CM

## 2022-05-04 DIAGNOSIS — R7309 Other abnormal glucose: Secondary | ICD-10-CM

## 2022-05-04 DIAGNOSIS — E782 Mixed hyperlipidemia: Secondary | ICD-10-CM

## 2022-05-04 DIAGNOSIS — E039 Hypothyroidism, unspecified: Secondary | ICD-10-CM | POA: Diagnosis not present

## 2022-05-04 DIAGNOSIS — Z853 Personal history of malignant neoplasm of breast: Secondary | ICD-10-CM

## 2022-05-04 DIAGNOSIS — Z8249 Family history of ischemic heart disease and other diseases of the circulatory system: Secondary | ICD-10-CM

## 2022-05-04 DIAGNOSIS — Z1212 Encounter for screening for malignant neoplasm of rectum: Secondary | ICD-10-CM | POA: Diagnosis not present

## 2022-05-04 DIAGNOSIS — R0989 Other specified symptoms and signs involving the circulatory and respiratory systems: Secondary | ICD-10-CM

## 2022-05-04 DIAGNOSIS — N1831 Chronic kidney disease, stage 3a: Secondary | ICD-10-CM | POA: Diagnosis not present

## 2022-05-04 DIAGNOSIS — E559 Vitamin D deficiency, unspecified: Secondary | ICD-10-CM

## 2022-05-04 DIAGNOSIS — Z136 Encounter for screening for cardiovascular disorders: Secondary | ICD-10-CM | POA: Diagnosis not present

## 2022-05-04 DIAGNOSIS — I700 Atherosclerosis of aorta: Secondary | ICD-10-CM | POA: Diagnosis not present

## 2022-05-04 DIAGNOSIS — Z79899 Other long term (current) drug therapy: Secondary | ICD-10-CM

## 2022-05-04 DIAGNOSIS — Z0000 Encounter for general adult medical examination without abnormal findings: Secondary | ICD-10-CM | POA: Diagnosis not present

## 2022-05-04 DIAGNOSIS — Z0001 Encounter for general adult medical examination with abnormal findings: Secondary | ICD-10-CM

## 2022-05-05 ENCOUNTER — Other Ambulatory Visit: Payer: Self-pay | Admitting: Internal Medicine

## 2022-05-05 DIAGNOSIS — K580 Irritable bowel syndrome with diarrhea: Secondary | ICD-10-CM

## 2022-05-05 DIAGNOSIS — F411 Generalized anxiety disorder: Secondary | ICD-10-CM

## 2022-05-05 DIAGNOSIS — F325 Major depressive disorder, single episode, in full remission: Secondary | ICD-10-CM

## 2022-05-05 LAB — LIPID PANEL
Cholesterol: 187 mg/dL (ref <200)
HDL: 75 mg/dL (ref >=50)
LDL Cholesterol (Calc): 88 mg/dL (calc)
Non-HDL Cholesterol (Calc): 112 mg/dL (calc) (ref <130)
Total CHOL/HDL Ratio: 2.5 (calc) (ref <5.0)
Triglycerides: 144 mg/dL (ref <150)

## 2022-05-05 LAB — COMPLETE METABOLIC PANEL WITH GFR
AG Ratio: 2.1 (calc) (ref 1.0–2.5)
ALT: 10 U/L (ref 6–29)
AST: 15 U/L (ref 10–35)
Albumin: 4.7 g/dL (ref 3.6–5.1)
Alkaline phosphatase (APISO): 73 U/L (ref 37–153)
BUN: 19 mg/dL (ref 7–25)
CO2: 25 mmol/L (ref 20–32)
Calcium: 10.1 mg/dL (ref 8.6–10.4)
Chloride: 103 mmol/L (ref 98–110)
Creat: 0.87 mg/dL (ref 0.60–0.95)
Globulin: 2.2 g/dL (calc) (ref 1.9–3.7)
Glucose, Bld: 93 mg/dL (ref 65–99)
Potassium: 4.6 mmol/L (ref 3.5–5.3)
Sodium: 140 mmol/L (ref 135–146)
Total Bilirubin: 1.2 mg/dL (ref 0.2–1.2)
Total Protein: 6.9 g/dL (ref 6.1–8.1)
eGFR: 62 mL/min/{1.73_m2} (ref >=60)

## 2022-05-05 LAB — URINALYSIS, ROUTINE W REFLEX MICROSCOPIC
Bilirubin Urine: NEGATIVE
Glucose, UA: NEGATIVE
Hgb urine dipstick: NEGATIVE
Ketones, ur: NEGATIVE
Leukocytes,Ua: NEGATIVE
Nitrite: NEGATIVE
Protein, ur: NEGATIVE
Specific Gravity, Urine: 1.004 (ref 1.001–1.035)
pH: 6 (ref 5.0–8.0)

## 2022-05-05 LAB — CBC WITH DIFFERENTIAL/PLATELET
Absolute Monocytes: 569 cells/uL (ref 200–950)
Basophils Absolute: 29 cells/uL (ref 0–200)
Basophils Relative: 0.4 %
Eosinophils Absolute: 79 cells/uL (ref 15–500)
Eosinophils Relative: 1.1 %
HCT: 39.3 % (ref 35.0–45.0)
Hemoglobin: 13.2 g/dL (ref 11.7–15.5)
Lymphs Abs: 2138 cells/uL (ref 850–3900)
MCH: 29.2 pg (ref 27.0–33.0)
MCHC: 33.6 g/dL (ref 32.0–36.0)
MCV: 86.9 fL (ref 80.0–100.0)
MPV: 10.3 fL (ref 7.5–12.5)
Monocytes Relative: 7.9 %
Neutro Abs: 4385 cells/uL (ref 1500–7800)
Neutrophils Relative %: 60.9 %
Platelets: 274 10*3/uL (ref 140–400)
RBC: 4.52 10*6/uL (ref 3.80–5.10)
RDW: 12.6 % (ref 11.0–15.0)
Total Lymphocyte: 29.7 %
WBC: 7.2 10*3/uL (ref 3.8–10.8)

## 2022-05-05 LAB — VITAMIN D 25 HYDROXY (VIT D DEFICIENCY, FRACTURES): Vit D, 25-Hydroxy: 81 ng/mL (ref 30–100)

## 2022-05-05 LAB — HEMOGLOBIN A1C
Hgb A1c MFr Bld: 5.5 % of total Hgb (ref <5.7)
Mean Plasma Glucose: 111 mg/dL
eAG (mmol/L): 6.2 mmol/L

## 2022-05-05 LAB — PTH, INTACT AND CALCIUM
Calcium: 10.1 mg/dL (ref 8.6–10.4)
PTH: 57 pg/mL (ref 16–77)

## 2022-05-05 LAB — TSH: TSH: 4.56 mIU/L — ABNORMAL HIGH (ref 0.40–4.50)

## 2022-05-05 LAB — MICROALBUMIN / CREATININE URINE RATIO
Creatinine, Urine: 23 mg/dL (ref 20–275)
Microalb Creat Ratio: 9 mcg/mg creat (ref <30)
Microalb, Ur: 0.2 mg/dL

## 2022-05-05 LAB — INSULIN, RANDOM: Insulin: 7.9 u[IU]/mL

## 2022-05-05 LAB — MAGNESIUM: Magnesium: 2.2 mg/dL (ref 1.5–2.5)

## 2022-05-05 MED ORDER — HYDROXYZINE HCL 25 MG PO TABS: ORAL_TABLET | ORAL | 1 refills | Status: DC

## 2022-05-05 MED ORDER — HYOSCYAMINE SULFATE ER 0.375 MG PO TB12: ORAL_TABLET | ORAL | 1 refills | Status: DC

## 2022-05-05 NOTE — Progress Notes (Signed)
<><><><><><><><><><><><><><><><><><><><><><><><><><><><><><><><><> <><><><><><><><><><><><><><><><><><><><><><><><><><><><><><><><><>  -   TSH is slightly elevated which means Thyroid hormone is low in blood , So   Recommend take an extra 1/2  tablet of levothyroxine 3 x / week on Mon Wed & Fri  <><><><><><><><><><><><><><><><><><><><><><><><><><><><><><><><><> <><><><><><><><><><><><><><><><><><><><><><><><><><><><><><><><><>  -  Chol 188    Excellent   - Very low risk for Heart Attack  / Stroke <><><><><><><><><><><><><><><><><><><><><><><><><><><><><><><><><> <><><><><><><><><><><><><><><><><><><><><><><><><><><><><><><><><>  - A1c - Normal - No Diabetes -   Great  !  <><><><><><><><><><><><><><><><><><><><><><><><><><><><><><><><><> <><><><><><><><><><><><><><><><><><><><><><><><><><><><><><><><><>  - Vit D = 81  - Excellent   - Please   keep  dose same                                                           <><><><><><><><><><><><><><><><><><><><><><><><><><><><><><><><><> <><><><><><><><><><><><><><><><><><><><><><><><><><><><><><><><><>  - All Else - CBC - Kidneys - Electrolytes - Liver - Magnesium   - all  Normal / OK <><><><><><><><><><><><><><><><><><><><><><><><><><><><><><><><><> <><><><><><><><><><><><><><><><><><><><><><><><><><><><><><><><><>

## 2022-05-06 ENCOUNTER — Other Ambulatory Visit: Payer: Self-pay | Admitting: Internal Medicine

## 2022-05-06 DIAGNOSIS — K580 Irritable bowel syndrome with diarrhea: Secondary | ICD-10-CM

## 2022-05-06 MED ORDER — HYOSCYAMINE SULFATE 0.125 MG PO TABS: ORAL_TABLET | ORAL | 0 refills | Status: AC

## 2022-05-07 DIAGNOSIS — M25662 Stiffness of left knee, not elsewhere classified: Secondary | ICD-10-CM | POA: Diagnosis not present

## 2022-05-07 DIAGNOSIS — M1712 Unilateral primary osteoarthritis, left knee: Secondary | ICD-10-CM | POA: Diagnosis not present

## 2022-05-07 DIAGNOSIS — R262 Difficulty in walking, not elsewhere classified: Secondary | ICD-10-CM | POA: Diagnosis not present

## 2022-05-07 DIAGNOSIS — M25562 Pain in left knee: Secondary | ICD-10-CM | POA: Diagnosis not present

## 2022-05-08 ENCOUNTER — Other Ambulatory Visit: Payer: Self-pay | Admitting: Internal Medicine

## 2022-05-08 MED ORDER — CIPROFLOXACIN HCL 500 MG PO TABS: 500.0000 mg | ORAL_TABLET | Freq: Two times a day (BID) | ORAL | 0 refills | Status: AC

## 2022-05-08 MED ORDER — DEXAMETHASONE 4 MG PO TABS: ORAL_TABLET | ORAL | 0 refills | Status: DC

## 2022-05-08 MED ORDER — METRONIDAZOLE 500 MG PO TABS: 500.0000 mg | ORAL_TABLET | Freq: Three times a day (TID) | ORAL | 0 refills | Status: AC

## 2022-05-09 ENCOUNTER — Other Ambulatory Visit: Payer: Self-pay | Admitting: Internal Medicine

## 2022-05-12 ENCOUNTER — Other Ambulatory Visit: Payer: Self-pay | Admitting: Internal Medicine

## 2022-05-20 ENCOUNTER — Other Ambulatory Visit: Payer: Self-pay | Admitting: Internal Medicine

## 2022-05-20 DIAGNOSIS — F325 Major depressive disorder, single episode, in full remission: Secondary | ICD-10-CM

## 2022-05-20 DIAGNOSIS — F411 Generalized anxiety disorder: Secondary | ICD-10-CM

## 2022-05-20 MED ORDER — DIAZEPAM 5 MG PO TABS: ORAL_TABLET | ORAL | 0 refills | Status: DC

## 2022-05-21 ENCOUNTER — Telehealth: Payer: Self-pay

## 2022-05-21 NOTE — Telephone Encounter (Signed)
The patient has been approved for Diazepam tablets 03/02/22 through 06/20/22.

## 2022-05-21 NOTE — Telephone Encounter (Signed)
A prior authorization has been submitted today for diazepam 5 mg tablets. Currently awaiting a decision.

## 2022-06-05 DIAGNOSIS — R5383 Other fatigue: Secondary | ICD-10-CM | POA: Diagnosis not present

## 2022-06-05 DIAGNOSIS — Z0000 Encounter for general adult medical examination without abnormal findings: Secondary | ICD-10-CM | POA: Diagnosis not present

## 2022-06-11 DIAGNOSIS — E039 Hypothyroidism, unspecified: Secondary | ICD-10-CM | POA: Diagnosis not present

## 2022-06-11 DIAGNOSIS — N183 Chronic kidney disease, stage 3 unspecified: Secondary | ICD-10-CM | POA: Diagnosis not present

## 2022-06-11 DIAGNOSIS — F411 Generalized anxiety disorder: Secondary | ICD-10-CM | POA: Diagnosis not present

## 2022-06-11 DIAGNOSIS — I700 Atherosclerosis of aorta: Secondary | ICD-10-CM | POA: Diagnosis not present

## 2022-06-11 DIAGNOSIS — I739 Peripheral vascular disease, unspecified: Secondary | ICD-10-CM | POA: Diagnosis not present

## 2022-06-11 DIAGNOSIS — F325 Major depressive disorder, single episode, in full remission: Secondary | ICD-10-CM | POA: Diagnosis not present

## 2022-06-11 DIAGNOSIS — R296 Repeated falls: Secondary | ICD-10-CM | POA: Diagnosis not present

## 2022-06-11 DIAGNOSIS — Z9181 History of falling: Secondary | ICD-10-CM | POA: Diagnosis not present

## 2022-06-23 DIAGNOSIS — I700 Atherosclerosis of aorta: Secondary | ICD-10-CM | POA: Diagnosis not present

## 2022-06-23 DIAGNOSIS — I739 Peripheral vascular disease, unspecified: Secondary | ICD-10-CM | POA: Diagnosis not present

## 2022-06-23 DIAGNOSIS — R296 Repeated falls: Secondary | ICD-10-CM | POA: Diagnosis not present

## 2022-06-23 DIAGNOSIS — N183 Chronic kidney disease, stage 3 unspecified: Secondary | ICD-10-CM | POA: Diagnosis not present

## 2022-06-23 DIAGNOSIS — Z9181 History of falling: Secondary | ICD-10-CM | POA: Diagnosis not present

## 2022-06-23 DIAGNOSIS — F411 Generalized anxiety disorder: Secondary | ICD-10-CM | POA: Diagnosis not present

## 2022-06-23 DIAGNOSIS — E039 Hypothyroidism, unspecified: Secondary | ICD-10-CM | POA: Diagnosis not present

## 2022-06-23 DIAGNOSIS — F325 Major depressive disorder, single episode, in full remission: Secondary | ICD-10-CM | POA: Diagnosis not present

## 2022-06-30 DIAGNOSIS — N183 Chronic kidney disease, stage 3 unspecified: Secondary | ICD-10-CM | POA: Diagnosis not present

## 2022-06-30 DIAGNOSIS — Z9181 History of falling: Secondary | ICD-10-CM | POA: Diagnosis not present

## 2022-06-30 DIAGNOSIS — I739 Peripheral vascular disease, unspecified: Secondary | ICD-10-CM | POA: Diagnosis not present

## 2022-06-30 DIAGNOSIS — I700 Atherosclerosis of aorta: Secondary | ICD-10-CM | POA: Diagnosis not present

## 2022-06-30 DIAGNOSIS — E039 Hypothyroidism, unspecified: Secondary | ICD-10-CM | POA: Diagnosis not present

## 2022-06-30 DIAGNOSIS — F325 Major depressive disorder, single episode, in full remission: Secondary | ICD-10-CM | POA: Diagnosis not present

## 2022-06-30 DIAGNOSIS — F411 Generalized anxiety disorder: Secondary | ICD-10-CM | POA: Diagnosis not present

## 2022-06-30 DIAGNOSIS — R296 Repeated falls: Secondary | ICD-10-CM | POA: Diagnosis not present

## 2022-07-07 ENCOUNTER — Ambulatory Visit (INDEPENDENT_AMBULATORY_CARE_PROVIDER_SITE_OTHER): Payer: Medicare HMO | Admitting: Nurse Practitioner

## 2022-07-07 ENCOUNTER — Encounter: Payer: Self-pay | Admitting: Nurse Practitioner

## 2022-07-07 VITALS — BP 120/66 | HR 57 | Temp 97.7°F | Ht 65.75 in | Wt 130.0 lb

## 2022-07-07 DIAGNOSIS — Z853 Personal history of malignant neoplasm of breast: Secondary | ICD-10-CM

## 2022-07-07 DIAGNOSIS — K635 Polyp of colon: Secondary | ICD-10-CM | POA: Diagnosis not present

## 2022-07-07 DIAGNOSIS — K648 Other hemorrhoids: Secondary | ICD-10-CM

## 2022-07-07 DIAGNOSIS — Z0001 Encounter for general adult medical examination with abnormal findings: Secondary | ICD-10-CM | POA: Diagnosis not present

## 2022-07-07 DIAGNOSIS — R6889 Other general symptoms and signs: Secondary | ICD-10-CM | POA: Diagnosis not present

## 2022-07-07 DIAGNOSIS — R0989 Other specified symptoms and signs involving the circulatory and respiratory systems: Secondary | ICD-10-CM

## 2022-07-07 DIAGNOSIS — J309 Allergic rhinitis, unspecified: Secondary | ICD-10-CM

## 2022-07-07 DIAGNOSIS — M353 Polymyalgia rheumatica: Secondary | ICD-10-CM

## 2022-07-07 DIAGNOSIS — M8588 Other specified disorders of bone density and structure, other site: Secondary | ICD-10-CM

## 2022-07-07 DIAGNOSIS — M159 Polyosteoarthritis, unspecified: Secondary | ICD-10-CM | POA: Diagnosis not present

## 2022-07-07 DIAGNOSIS — E559 Vitamin D deficiency, unspecified: Secondary | ICD-10-CM

## 2022-07-07 DIAGNOSIS — E782 Mixed hyperlipidemia: Secondary | ICD-10-CM

## 2022-07-07 DIAGNOSIS — I700 Atherosclerosis of aorta: Secondary | ICD-10-CM

## 2022-07-07 DIAGNOSIS — N1831 Chronic kidney disease, stage 3a: Secondary | ICD-10-CM | POA: Diagnosis not present

## 2022-07-07 DIAGNOSIS — E039 Hypothyroidism, unspecified: Secondary | ICD-10-CM

## 2022-07-07 DIAGNOSIS — Z0000 Encounter for general adult medical examination without abnormal findings: Secondary | ICD-10-CM

## 2022-07-07 DIAGNOSIS — F325 Major depressive disorder, single episode, in full remission: Secondary | ICD-10-CM | POA: Diagnosis not present

## 2022-07-07 DIAGNOSIS — Z79899 Other long term (current) drug therapy: Secondary | ICD-10-CM

## 2022-07-07 NOTE — Progress Notes (Signed)
MEDICARE ANNUAL WELLNESS VISIT AND FOLLOW UP  Assessment:   Diagnoses and all orders for this visit:  Encounter for Medicare annual wellness exam Due Annually Health maintenance reviewed  Labile hypertension At goal. Not currently on medications. Monitor blood pressure at home; call if consistently over 140/80 Continue DASH diet.   Reminder to go to the ER if any CP, SOB, nausea, dizziness, severe HA, changes vision/speech, left arm numbness and tingling and jaw pain.  Aortic atherosclerosis (HCC) by LS spine XR 11/2018 Control blood pressure Control cholesterol, glucose,  Stay active and increase exercise.   Internal hemorrhoids Stable.  No new events. Treat PRN Continue to monitor.  Allergic rhinitis, unspecified seasonality, unspecified trigger OTC antihistamine PRN Avoid triggers.  Polyp of colon, unspecified part of colon, unspecified type Hx of, No further colonoscopies due to age, no concerning sx  Hypothyroidism, unspecified type Continue Synthroid. Continue to monitor.  Osteoarthritis of multiple joints, unspecified osteoarthritis type Past R TKA Continue Pregabalin No new flares Followed by Orthopedics. Continue to monitor.  Stage 3a chronic kidney disease (HCC) Discussed how what you eat and drink can aide in kidney protection. Stay well hydrated. Avoid high salt foods. Avoid NSAIDS. Keep BP and BG well controlled.   Take medications as prescribed. Remain active and exercise as tolerated daily. Maintain weight.  Continue to monitor. Check CMP/GFR/Microablumin  Personal history of malignant neoplasm of breast Continue annual mammograms  Hyperlipidemia, mixed Continue Simvastatin. Discussed lifestyle modifications. Recommended diet heavy in fruits and veggies, omega 3's. Decrease consumption of animal meats, cheeses, and dairy products. Remain active and exercise as tolerated. Continue to monitor. Check lipids/TSH  Vitamin D  deficiency Above goal. Continue to monitor.  Major depression in remission (HCC) Stable Continue stress management Stay well hydrated. Suggest 6-8 hours sleep. i  PMR (polymyalgia rheumatica) (HCC) Stable.  No recurrent sx  Dr. Corliss Skains following  Medication management All medications reviewed at length. All questions and concerns addressed.  Osteopenia Pursue a combination of weight-bearing exercises and strength training. Patients with severe mobility impairment should be referred for physical therapy. Advised on fall prevention measures including proper lighting in all rooms, removal of area rugs and floor clutter, use of walking devices as deemed appropriate, avoidance of uneven walking surfaces. Smoking cessation and moderate alcohol consumption if applicable Consume 800 to 1000 IU of vitamin D daily with a goal vitamin D serum value of 30 ng/mL or higher. Aim for 1000 to 1200 mg of elemental calcium daily through supplements and/or dietary sources.   Orders Placed This Encounter  Procedures   DG Bone Density    Standing Status:   Future    Standing Expiration Date:   07/08/2023    Order Specific Question:   Reason for Exam (SYMPTOM  OR DIAGNOSIS REQUIRED)    Answer:   osteopenia    Order Specific Question:   Preferred imaging location?    Answer:   [facility]   CBC with Differential/Platelet   COMPLETE METABOLIC PANEL WITH GFR   Lipid panel   TSH   VITAMIN D 25 Hydroxy (Vit-D Deficiency, Fractures)    Notify office for further evaluation and treatment, questions or concerns if any reported s/s fail to improve.   The patient was advised to call back or seek an in-person evaluation if any symptoms worsen or if the condition fails to improve as anticipated.   Further disposition pending results of labs. Discussed med's effects and SE's.    I discussed the assessment and treatment  plan with the patient. The patient was provided an opportunity to ask questions  and all were answered. The patient agreed with the plan and demonstrated an understanding of the instructions.  Discussed med's effects and SE's. Screening labs and tests as requested with regular follow-up as recommended.  I provided 40 minutes of face-to-face time during this encounter including counseling, chart review, and critical decision making was preformed.  Today's Plan of Care is based on a patient-centered health care approach known as shared decision making - the decisions, tests and treatments allow for patient preferences and values to be balanced with clinical evidence.     Future Appointments  Date Time Provider Department Center  10/29/2022  2:30 PM Lucky Cowboy, MD [facility] None  05/06/2023  2:00 PM Lucky Cowboy, MD [facility] None  08/09/2023 11:00 AM Adela Glimpse, NP [facility] None     Plan:   During the course of the visit the patient was educated and counseled about appropriate screening and preventive services including:   Pneumococcal vaccine  Prevnar 13 Influenza vaccine Td vaccine Screening electrocardiogram Bone densitometry screening Colorectal cancer screening Diabetes screening Glaucoma screening Nutrition counseling  Advanced directives: requested   Subjective:  Alexandria Davies is a 89 y.o. female who presents for Medicare Annual Wellness Visit and 3 month follow up.   Overall she reports feeling well today.  She has no new concerns at this time.   She has a diagnosis of anxiety and has been on valium 5 mg TID PRN. she currently takes very rarely, takes 2-3 a month or so when she cannot sleep.   She was diagnosed with PMR and follows with Dr. Cathie Beams has successfully tapered off of prednisone without recurrent sx.   BMI is Body mass index is 21.14 kg/m., she has been working on diet, exercise is somewhat limited due to joint pains (mainly knees) recently, but generally active around her house and yard.  Wt Readings from Last 3  Encounters:  07/07/22 130 lb (59 kg)  05/04/22 132 lb 3.2 oz (60 kg)  02/09/22 131 lb 9.6 oz (59.7 kg)   She does not currently check BP at home, today their BP is BP: 120/66 Similar by manual recheck by provider. Last check in office was low, 122/60, historically labile.  She does not workout. She denies chest pain, shortness of breath, dizziness.  She has aortic atherosclerosis per XR 11/2018.    She is on cholesterol medication (simvastatin 20 mg daily) and denies myalgias. Her cholesterol is at goal. The cholesterol last visit was:   Lab Results  Component Value Date   CHOL 158 07/07/2022   HDL 61 07/07/2022   LDLCALC 77 07/07/2022   TRIG 122 07/07/2022   CHOLHDL 2.6 07/07/2022   She has been working on diet and exercise for glucose management, and denies foot ulcerations, increased appetite, nausea, paresthesia of the feet, polydipsia, polyuria, visual disturbances, vomiting and weight loss. Last A1C in the office was:  Lab Results  Component Value Date   HGBA1C 5.5 05/04/2022   She is on thyroid medication. Her medication was not changed last visit. Synthroid 1 tab (150 mcg on Wednesday, and 1/2 tab all other days).   Lab Results  Component Value Date   TSH 0.15 (L) 07/07/2022   She has CKD IIIa monitored at this office. Last GFR: Lab Results  Component Value Date   GFRNONAA 60 08/13/2020   Patient is on Vitamin D supplement.  Elevated last check.  She has held  off talking the medication. Lab Results  Component Value Date   VD25OH 91 07/07/2022      Medication Review: Current Outpatient Medications on File Prior to Visit  Medication Sig Dispense Refill   aspirin (ASPIRIN 81) 81 MG EC tablet Take 1 tablet daily     Cholecalciferol (VITAMIN D PO) Take 5,000 Units by mouth 2 (two) times daily.     clotrimazole-betamethasone (LOTRISONE) cream Apply 1 Application topically 2 (two) times daily. 30 g 1   diazepam (VALIUM) 5 MG tablet Take 1/2-1 tablet 2 - 3 x /day ONLY if  needed for Anxiety Attack &  limit to 5 days /week to avoid addiction 30 tablet 0   hydrOXYzine (ATARAX) 25 MG tablet Take 1 tablet 3 x /day for Chronic Anxiety  ( Replaces Diazepam ) 270 tablet 1   hyoscyamine (LEVSIN) 0.125 MG tablet Take  1 to 2 tablets  3 to 4 x /day  (every 4 to 6 hours) as needed for Nausea, Cramping or Diarrhea 120 tablet 0   levothyroxine (SYNTHROID) 75 MCG tablet Take  1 tablet  Daily  on an empty stomach with only water for 30 minutes & no Antacid meds, Calcium or Magnesium for 4 hours & avoid Biotin 90 tablet 3   mirtazapine (REMERON) 30 MG tablet Take  1 tablet  1 hour  before  Bedtime for Appetite & Sleep 90 tablet 1   pregabalin (LYRICA) 50 MG capsule Take 50 mg by mouth 3 (three) times daily.     Probiotic Product (PROBIOTIC PO) Take 1 tablet by mouth daily.     simvastatin (ZOCOR) 20 MG tablet TAKE 1 TABLET AT BEDTIME   FOR CHOLESTEROL 90 tablet 3   dexamethasone (DECADRON) 4 MG tablet Take 1 tab 3 x /day for 2 days,      then 2 x /day for 2  Days,     then 1 tab daily 13 tablet 0   No current facility-administered medications on file prior to visit.    Allergies  Allergen Reactions   Ciprofloxacin    Epinephrine Other (See Comments)    Heart raced    Etodolac Hives   Lyrica [Pregabalin]    Synephrine     Palpitations    Flagyl [Metronidazole Hcl] Rash    Current Problems (verified) Patient Active Problem List   Diagnosis Date Noted   Aortic atherosclerosis (HCC) by LS spine XR 11/2018 02/06/2020   Major depression in remission (HCC) 10/16/2019   CKD (chronic kidney disease) stage 3, GFR 30-59 ml/min (HCC) 07/15/2019   PMR (polymyalgia rheumatica) (HCC) 10/26/2018   Insomnia 10/22/2014   Generalized anxiety disorder 10/22/2014   Labile hypertension 02/14/2014   Hyperlipidemia, mixed 02/14/2014   Vitamin D deficiency 02/14/2014   Rhinitis, allergic 08/19/2011   Diverticulosis 04/23/2011   Colon polyps 04/23/2011   PERSONAL HISTORY OF MALIGNANT  NEOPLASM OF BREAST 12/07/2007   Hypothyroidism 12/05/2007   Osteoarthritis 12/05/2007   Internal hemorrhoids 11/08/2002    Screening Tests Immunization History  Administered Date(s) Administered   Influenza Split 12/01/2010   Influenza, High Dose Seasonal PF 11/12/2015, 11/23/2016, 12/01/2017, 12/06/2018, 12/12/2019, 12/03/2020   Influenza-Unspecified 10/31/2012, 11/29/2014   PFIZER(Purple Top)SARS-COV-2 Vaccination 05/13/2019, 06/06/2019   PNEUMOCOCCAL CONJUGATE-20 01/12/2022   Pneumococcal Conjugate-13 10/18/2013   Pneumococcal Polysaccharide-23 03/20/2010   Td 10/17/2012, 01/12/2020    Preventative care: Last colonoscopy: 2009, hx of polyps, done d/t age Mammogram:  09/2021 Bone Density:  Overdue - order placed  Prior vaccinations:  Shingles/Zostavax: declines  Covid 19: 2/2, 2021, pfizer - declined booster  Names of Other Physician/Practitioners you currently use: 1. [facility] here for primary care 2. Dr. Dione Booze, eye doctor, last visit 2023, encouraged to schedule    Patient Care Team: Lucky Cowboy, MD as PCP - General (Internal Medicine) Marcene Corning, MD as Consulting Physician (Orthopedic Surgery) Waymon Budge, MD as Consulting Physician (Pulmonary Disease) Louis Meckel, MD (Inactive) as Consulting Physician (Gastroenterology) Jethro Bolus, MD (Inactive) as Consulting Physician (Urology) Sundra Aland, [facility] as Pharmacist (Pharmacist)  SURGICAL HISTORY She  has a past surgical history that includes Abdominal hysterectomy; bladder tac; Bunionectomy; right shoulder (2003); Wrist fracture surgery (2003); Cystocele repair (2009); Tonsillectomy; Knee arthroscopy (Left, 01/17/2013); Colonoscopy (2009); Eye surgery (Bilateral); Total knee arthroplasty (Right, 11/26/2015); and Breast lumpectomy (2004). FAMILY HISTORY Her family history includes Alzheimer's disease in her mother and sister; Breast cancer in her niece,  niece, and sister; Breast cancer (age of onset: 86) in her daughter; Cancer (age of onset: 95) in her sister; Cancer (age of onset: 43) in her sister; Colon cancer in her son; Heart disease in her mother; Testicular cancer in her father. SOCIAL HISTORY She  reports that she has never smoked. She has never used smokeless tobacco. She reports current alcohol use. She reports that she does not use drugs.   MEDICARE WELLNESS OBJECTIVES: Physical activity:   Cardiac risk factors:   Depression/mood screen:      05/03/2022   11:56 PM  Depression screen PHQ 2/9  Decreased Interest 0  Down, Depressed, Hopeless 0  PHQ - 2 Score 0    ADLs:     05/03/2022   11:56 PM 07/21/2021    1:44 PM  In your present state of health, do you have any difficulty performing the following activities:  Hearing? 1 0  Comment Has Rt ear hearing aid   Vision? 0   Difficulty concentrating or making decisions? 0 0  Walking or climbing stairs? 0 0  Dressing or bathing? 0 0  Doing errands, shopping? 0 0  Preparing Food and eating ?  N  Using the Toilet?  N  In the past six months, have you accidently leaked urine?  N  Do you have problems with loss of bowel control?  N  Managing your Medications?  N  Managing your Finances?  N  Housekeeping or managing your Housekeeping?  N     Cognitive Testing  Alert? Yes  Normal Appearance?Yes  Oriented to person? Yes  Place? Yes   Time? Yes  Recall of three objects?  Yes  Can perform simple calculations? Yes  Displays appropriate judgment?Yes  Can read the correct time from a watch face?Yes  EOL planning:      Objective:     Today's Vitals   07/07/22 1513  BP: 120/66  Pulse: (!) 57  Temp: 97.7 F (36.5 C)  SpO2: 98%  Weight: 130 lb (59 kg)  Height: 5' 5.75" (1.67 m)    Body mass index is 21.14 kg/m.  General appearance: alert, no distress, WD/WN, female HEENT: normocephalic, sclerae anicteric, TMs pearly, nares patent, no discharge or erythema, pharynx  normal Oral cavity: MMM, no lesions Neck: supple, no lymphadenopathy, no thyromegaly, no masses Heart: RRR, normal S1, S2, no murmurs Lungs: CTA bilaterally, no wheezes, rhonchi, or rales Abdomen: +bs, soft, non tender, non distended, no masses, no hepatomegaly, no splenomegaly Musculoskeletal: nontender, no swelling, no obvious deformity Extremities: no edema, no cyanosis, no clubbing Pulses:  2+ symmetric, upper and lower extremities, normal cap refill Neurological: alert, oriented x 3, CN2-12 intact, strength normal upper extremities and lower extremities, sensation normal throughout, DTRs 2+ throughout, no cerebellar signs, gait normal Psychiatric: normal affect, behavior normal, pleasant   Medicare Attestation I have personally reviewed: The patient's medical and social history Their use of alcohol, tobacco or illicit drugs Their current medications and supplements The patient's functional ability including ADLs,fall risks, home safety risks, cognitive, and hearing and visual impairment Diet and physical activities Evidence for depression or mood disorders  The patient's weight, height, BMI, and visual acuity have been recorded in the chart.  I have made referrals, counseling, and provided education to the patient based on review of the above and I have provided the patient with a written personalized care plan for preventive services.     Adela Glimpse, NP   07/08/2022

## 2022-07-08 ENCOUNTER — Encounter: Payer: Self-pay | Admitting: Nurse Practitioner

## 2022-07-08 LAB — COMPLETE METABOLIC PANEL WITH GFR
AG Ratio: 2 (calc) (ref 1.0–2.5)
ALT: 8 U/L (ref 6–29)
AST: 13 U/L (ref 10–35)
Albumin: 4.2 g/dL (ref 3.6–5.1)
Alkaline phosphatase (APISO): 79 U/L (ref 37–153)
BUN: 14 mg/dL (ref 7–25)
CO2: 26 mmol/L (ref 20–32)
Calcium: 10.2 mg/dL (ref 8.6–10.4)
Chloride: 104 mmol/L (ref 98–110)
Creat: 0.75 mg/dL (ref 0.60–0.95)
Globulin: 2.1 g/dL (calc) (ref 1.9–3.7)
Glucose, Bld: 89 mg/dL (ref 65–99)
Potassium: 4.1 mmol/L (ref 3.5–5.3)
Sodium: 139 mmol/L (ref 135–146)
Total Bilirubin: 1.1 mg/dL (ref 0.2–1.2)
Total Protein: 6.3 g/dL (ref 6.1–8.1)
eGFR: 74 mL/min/{1.73_m2} (ref >=60)

## 2022-07-08 LAB — CBC WITH DIFFERENTIAL/PLATELET
Absolute Monocytes: 692 cells/uL (ref 200–950)
Basophils Absolute: 61 cells/uL (ref 0–200)
Basophils Relative: 0.8 %
Eosinophils Absolute: 304 cells/uL (ref 15–500)
Eosinophils Relative: 4 %
HCT: 37.3 % (ref 35.0–45.0)
Hemoglobin: 12.5 g/dL (ref 11.7–15.5)
Lymphs Abs: 1991 cells/uL (ref 850–3900)
MCH: 28.9 pg (ref 27.0–33.0)
MCHC: 33.5 g/dL (ref 32.0–36.0)
MCV: 86.1 fL (ref 80.0–100.0)
MPV: 10.1 fL (ref 7.5–12.5)
Monocytes Relative: 9.1 %
Neutro Abs: 4552 cells/uL (ref 1500–7800)
Neutrophils Relative %: 59.9 %
Platelets: 308 10*3/uL (ref 140–400)
RBC: 4.33 10*6/uL (ref 3.80–5.10)
RDW: 12.3 % (ref 11.0–15.0)
Total Lymphocyte: 26.2 %
WBC: 7.6 10*3/uL (ref 3.8–10.8)

## 2022-07-08 LAB — LIPID PANEL
Cholesterol: 158 mg/dL (ref <200)
HDL: 61 mg/dL (ref >=50)
LDL Cholesterol (Calc): 77 mg/dL (calc)
Non-HDL Cholesterol (Calc): 97 mg/dL (calc) (ref <130)
Total CHOL/HDL Ratio: 2.6 (calc) (ref <5.0)
Triglycerides: 122 mg/dL (ref <150)

## 2022-07-08 LAB — TSH: TSH: 0.15 mIU/L — ABNORMAL LOW (ref 0.40–4.50)

## 2022-07-08 LAB — VITAMIN D 25 HYDROXY (VIT D DEFICIENCY, FRACTURES): Vit D, 25-Hydroxy: 91 ng/mL (ref 30–100)

## 2022-07-08 NOTE — Patient Instructions (Signed)

## 2022-08-05 ENCOUNTER — Other Ambulatory Visit: Payer: Self-pay | Admitting: Internal Medicine

## 2022-08-05 DIAGNOSIS — Z1231 Encounter for screening mammogram for malignant neoplasm of breast: Secondary | ICD-10-CM

## 2022-08-24 NOTE — Progress Notes (Unsigned)
Assessment and Plan:  There are no diagnoses linked to this encounter.    Further disposition pending results of labs. Discussed med's effects and SE's.   Over 30 minutes of exam, counseling, chart review, and critical decision making was performed.   Future Appointments  Date Time Provider Department Center  08/25/2022 11:15 AM Raynelle Dick, NP [facility] None  10/20/2022  2:00 PM [facility]  10/29/2022  2:30 PM Lucky Cowboy, MD [facility] None  05/06/2023  2:00 PM Lucky Cowboy, MD [facility] None  08/09/2023 11:00 AM Adela Glimpse, NP [facility] None    ------------------------------------------------------------------------------------------------------------------   HPI There were no vitals taken for this visit. 89 y.o.female presents for  Past Medical History:  Diagnosis Date   Anxiety    Breast cancer (HCC)    left   Colon polyp 2009   BENIGN POLYPOID   Diverticulosis of colon (without mention of hemorrhage) 2009   DJD (degenerative joint disease)    Family history of malignant neoplasm of gastrointestinal tract 05/06/2011   HOH (hard of hearing)    Right ear   Hypercholesteremia    Hypothyroidism    IBS (irritable bowel syndrome)    Internal hemorrhoids without mention of complication    Primary osteoarthritis of right knee 11/26/2015   S/p TKA   Sinus drainage    Vitiligo    Wears dentures    top   Wears glasses      Allergies  Allergen Reactions   Ciprofloxacin    Epinephrine Other (See Comments)    Heart raced    Etodolac Hives   Lyrica [Pregabalin]    Synephrine     Palpitations    Flagyl [Metronidazole Hcl] Rash    Current Outpatient Medications on File Prior to Visit  Medication Sig   aspirin (ASPIRIN 81) 81 MG EC tablet Take 1 tablet daily   Cholecalciferol (VITAMIN D PO) Take 5,000 Units by mouth 2 (two) times daily.   clotrimazole-betamethasone (LOTRISONE) cream Apply 1 Application topically 2 (two) times  daily.   diazepam (VALIUM) 5 MG tablet Take 1/2-1 tablet 2 - 3 x /day ONLY if needed for Anxiety Attack &  limit to 5 days /week to avoid addiction   hydrOXYzine (ATARAX) 25 MG tablet Take 1 tablet 3 x /day for Chronic Anxiety  ( Replaces Diazepam )   hyoscyamine (LEVSIN) 0.125 MG tablet Take  1 to 2 tablets  3 to 4 x /day  (every 4 to 6 hours) as needed for Nausea, Cramping or Diarrhea   levothyroxine (SYNTHROID) 75 MCG tablet Take  1 tablet  Daily  on an empty stomach with only water for 30 minutes & no Antacid meds, Calcium or Magnesium for 4 hours & avoid Biotin   mirtazapine (REMERON) 30 MG tablet Take  1 tablet  1 hour  before  Bedtime for Appetite & Sleep   pregabalin (LYRICA) 50 MG capsule Take 50 mg by mouth 3 (three) times daily.   Probiotic Product (PROBIOTIC PO) Take 1 tablet by mouth daily.   simvastatin (ZOCOR) 20 MG tablet TAKE 1 TABLET AT BEDTIME   FOR CHOLESTEROL   No current facility-administered medications on file prior to visit.    ROS: all negative except above.   Physical Exam:  There were no vitals taken for this visit.  General Appearance: Well nourished, in no apparent distress. Eyes: PERRLA, EOMs, conjunctiva no swelling or erythema Sinuses: No Frontal/maxillary tenderness ENT/Mouth: Ext aud canals clear, TMs without erythema, bulging.  No erythema, swelling, or exudate on post pharynx.  Tonsils not swollen or erythematous. Hearing normal.  Neck: Supple, thyroid normal.  Respiratory: Respiratory effort normal, BS equal bilaterally without rales, rhonchi, wheezing or stridor.  Cardio: RRR with no MRGs. Brisk peripheral pulses without edema.  Abdomen: Soft, + BS.  Non tender, no guarding, rebound, hernias, masses. Lymphatics: Non tender without lymphadenopathy.  Musculoskeletal: Full ROM, 5/5 strength, normal gait.  Skin: Warm, dry without rashes, lesions, ecchymosis.  Neuro: Cranial nerves intact. Normal muscle tone, no cerebellar symptoms. Sensation intact.   Psych: Awake and oriented X 3, normal affect, Insight and Judgment appropriate.     Raynelle Dick, NP 1:47 PM [facility]

## 2022-08-25 ENCOUNTER — Encounter: Payer: Self-pay | Admitting: Nurse Practitioner

## 2022-08-25 ENCOUNTER — Ambulatory Visit (INDEPENDENT_AMBULATORY_CARE_PROVIDER_SITE_OTHER): Payer: Medicare HMO | Admitting: Nurse Practitioner

## 2022-08-25 VITALS — BP 130/62 | HR 61 | Temp 97.3°F | Ht 65.75 in | Wt 127.2 lb

## 2022-08-25 DIAGNOSIS — D508 Other iron deficiency anemias: Secondary | ICD-10-CM | POA: Diagnosis not present

## 2022-08-25 DIAGNOSIS — D509 Iron deficiency anemia, unspecified: Secondary | ICD-10-CM | POA: Diagnosis not present

## 2022-08-25 DIAGNOSIS — R5383 Other fatigue: Secondary | ICD-10-CM

## 2022-08-25 DIAGNOSIS — N1831 Chronic kidney disease, stage 3a: Secondary | ICD-10-CM | POA: Diagnosis not present

## 2022-08-25 DIAGNOSIS — E039 Hypothyroidism, unspecified: Secondary | ICD-10-CM

## 2022-08-25 LAB — CBC WITH DIFFERENTIAL/PLATELET
Basophils Absolute: 32 cells/uL (ref 0–200)
Basophils Relative: 0.5 %
Monocytes Relative: 9.6 %
RDW: 12.9 % (ref 11.0–15.0)

## 2022-08-25 LAB — TEST AUTHORIZATION

## 2022-08-25 NOTE — Patient Instructions (Signed)
Fatigue If you have fatigue, you feel tired all the time and have a lack of energy or a lack of motivation. Fatigue may make it difficult to start or complete tasks because of exhaustion. Occasional or mild fatigue is often a normal response to activity or life. However, long-term (chronic) or extreme fatigue may be a symptom of a medical condition such as: Depression. Not having enough red blood cells or hemoglobin in the blood (anemia). A problem with a small gland located in the lower front part of the neck (thyroid disorder). Rheumatologic conditions. These are problems related to the body's defense system (immune system). Infections, especially certain viral infections. Fatigue can also lead to negative health outcomes over time. Follow these instructions at home: Medicines Take over-the-counter and prescription medicines only as told by your health care provider. Take a multivitamin if told by your health care provider. Do not use herbal or dietary supplements unless they are approved by your health care provider. Eating and drinking  Avoid heavy meals in the evening. Eat a well-balanced diet, which includes lean proteins, whole grains, plenty of fruits and vegetables, and low-fat dairy products. Avoid eating or drinking too many products with caffeine in them. Avoid alcohol. Drink enough fluid to keep your urine pale yellow. Activity  Exercise regularly, as told by your health care provider. Use or practice techniques to help you relax, such as yoga, tai chi, meditation, or massage therapy. Lifestyle Change situations that cause you stress. Try to keep your work and personal schedules in balance. Do not use recreational or illegal drugs. General instructions Monitor your fatigue for any changes. Go to bed and get up at the same time every day. Avoid fatigue by pacing yourself during the day and getting enough sleep at night. Maintain a healthy weight. Contact a health care  provider if: Your fatigue does not get better. You have a fever. You suddenly lose or gain weight. You have headaches. You have trouble falling asleep or sleeping through the night. You feel angry, guilty, anxious, or sad. You have swelling in your legs or another part of your body. Get help right away if: You feel confused, feel like you might faint, or faint. Your vision is blurry or you have a severe headache. You have severe pain in your abdomen, your back, or the area between your waist and hips (pelvis). You have chest pain, shortness of breath, or an irregular or fast heartbeat. You are unable to urinate, or you urinate less than normal. You have abnormal bleeding from the rectum, nose, lungs, nipples, or, if you are female, the vagina. You vomit blood. You have thoughts about hurting yourself or others. These symptoms may be an emergency. Get help right away. Call 911. Do not wait to see if the symptoms will go away. Do not drive yourself to the hospital. Get help right away if you feel like you may hurt yourself or others, or have thoughts about taking your own life. Go to your nearest emergency room or: Call 911. Call the National Suicide Prevention Lifeline at 1-800-273-8255 or 988. This is open 24 hours a day. Text the Crisis Text Line at 741741. Summary If you have fatigue, you feel tired all the time and have a lack of energy or a lack of motivation. Fatigue may make it difficult to start or complete tasks because of exhaustion. Long-term (chronic) or extreme fatigue may be a symptom of a medical condition. Exercise regularly, as told by your health care provider.   Change situations that cause you stress. Try to keep your work and personal schedules in balance. This information is not intended to replace advice given to you by your health care provider. Make sure you discuss any questions you have with your health care provider. Document Revised: 12/09/2020 Document  Reviewed: 12/09/2020 Elsevier Patient Education  2024 [employer]  

## 2022-08-25 NOTE — Addendum Note (Signed)
Addended by: Anda Kraft E on: 08/25/2022 12:54 PM   Modules accepted: Orders

## 2022-08-25 NOTE — Addendum Note (Signed)
Addended by: Anda Kraft E on: 08/25/2022 12:30 PM   Modules accepted: Orders

## 2022-08-26 ENCOUNTER — Other Ambulatory Visit: Payer: Self-pay | Admitting: Nurse Practitioner

## 2022-08-26 DIAGNOSIS — E039 Hypothyroidism, unspecified: Secondary | ICD-10-CM

## 2022-08-26 LAB — COMPLETE METABOLIC PANEL WITH GFR
AG Ratio: 2.1 (calc) (ref 1.0–2.5)
BUN: 21 mg/dL (ref 7–25)
Calcium: 10.1 mg/dL (ref 8.6–10.4)
Chloride: 105 mmol/L (ref 98–110)
Globulin: 2.1 g/dL (calc) (ref 1.9–3.7)

## 2022-08-26 LAB — CBC WITH DIFFERENTIAL/PLATELET
Eosinophils Relative: 2.1 %
MCHC: 32.8 g/dL (ref 32.0–36.0)
MPV: 10.2 fL (ref 7.5–12.5)
Neutro Abs: 3742 cells/uL (ref 1500–7800)
Platelets: 245 10*3/uL (ref 140–400)

## 2022-08-26 LAB — TEST AUTHORIZATION

## 2022-08-26 MED ORDER — LEVOTHYROXINE SODIUM 50 MCG PO TABS
50.0000 ug | ORAL_TABLET | Freq: Every day | ORAL | 3 refills | Status: DC
Start: 2022-08-26 — End: 2022-08-26

## 2022-08-26 MED ORDER — LEVOTHYROXINE SODIUM 50 MCG PO TABS: ORAL_TABLET | ORAL | 3 refills | Status: DC

## 2022-08-26 NOTE — Progress Notes (Signed)
Pt was taking her levothyroxine 75 mcg 1 tab 4 days a week was not taking 1/2 tabs.  She is advised to start taking levothyroxine 50 mcg 5 days a week. Script is sent to the pharmacy and can recheck at next appointment

## 2022-08-27 LAB — COMPLETE METABOLIC PANEL WITH GFR
ALT: 15 U/L (ref 6–29)
AST: 17 U/L (ref 10–35)
Albumin: 4.4 g/dL (ref 3.6–5.1)
Alkaline phosphatase (APISO): 70 U/L (ref 37–153)
CO2: 27 mmol/L (ref 20–32)
Creat: 0.78 mg/dL (ref 0.60–0.95)
Glucose, Bld: 95 mg/dL (ref 65–99)
Potassium: 4.5 mmol/L (ref 3.5–5.3)
Sodium: 141 mmol/L (ref 135–146)
Total Bilirubin: 1.4 mg/dL — ABNORMAL HIGH (ref 0.2–1.2)
Total Protein: 6.5 g/dL (ref 6.1–8.1)
eGFR: 70 mL/min/{1.73_m2} (ref >=60)

## 2022-08-27 LAB — CBC WITH DIFFERENTIAL/PLATELET
Absolute Monocytes: 605 cells/uL (ref 200–950)
Eosinophils Absolute: 132 cells/uL (ref 15–500)
HCT: 38.1 % (ref 35.0–45.0)
Hemoglobin: 12.5 g/dL (ref 11.7–15.5)
Lymphs Abs: 1789 cells/uL (ref 850–3900)
MCH: 28.3 pg (ref 27.0–33.0)
MCV: 86.2 fL (ref 80.0–100.0)
Neutrophils Relative %: 59.4 %
RBC: 4.42 10*6/uL (ref 3.80–5.10)
Total Lymphocyte: 28.4 %
WBC: 6.3 10*3/uL (ref 3.8–10.8)

## 2022-08-27 LAB — TEST AUTHORIZATION: TEST CODE:: 457

## 2022-08-27 LAB — TSH: TSH: 0.37 mIU/L — ABNORMAL LOW (ref 0.40–4.50)

## 2022-08-27 LAB — IRON,TIBC AND FERRITIN PANEL
%SAT: 32 % (calc) (ref 16–45)
Ferritin: 186 ng/mL (ref 16–288)
Iron: 105 ug/dL (ref 45–160)
TIBC: 330 mcg/dL (calc) (ref 250–450)

## 2022-09-28 ENCOUNTER — Other Ambulatory Visit: Payer: Self-pay | Admitting: Internal Medicine

## 2022-09-28 DIAGNOSIS — F411 Generalized anxiety disorder: Secondary | ICD-10-CM

## 2022-09-28 MED ORDER — BUSPIRONE HCL 10 MG PO TABS
ORAL_TABLET | ORAL | 0 refills | Status: AC
Start: 2022-09-28 — End: ?

## 2022-10-13 ENCOUNTER — Other Ambulatory Visit: Payer: Self-pay

## 2022-10-13 MED ORDER — SIMVASTATIN 20 MG PO TABS: ORAL_TABLET | ORAL | 3 refills | Status: AC

## 2022-10-20 ENCOUNTER — Ambulatory Visit
Admission: RE | Admit: 2022-10-20 | Discharge: 2022-10-20 | Disposition: A | Discharge status: Home / Self Care | Payer: Medicare HMO | Source: Ambulatory Visit | Attending: Internal Medicine | Admitting: Internal Medicine

## 2022-10-20 DIAGNOSIS — Z1231 Encounter for screening mammogram for malignant neoplasm of breast: Secondary | ICD-10-CM

## 2022-10-29 ENCOUNTER — Ambulatory Visit (INDEPENDENT_AMBULATORY_CARE_PROVIDER_SITE_OTHER): Payer: Medicare HMO | Admitting: Internal Medicine

## 2022-10-29 ENCOUNTER — Encounter: Payer: Self-pay | Admitting: Internal Medicine

## 2022-10-29 VITALS — BP 136/78 | HR 69 | Temp 97.9°F | Resp 17 | Ht 65.75 in | Wt 126.2 lb

## 2022-10-29 DIAGNOSIS — I700 Atherosclerosis of aorta: Secondary | ICD-10-CM

## 2022-10-29 DIAGNOSIS — R7309 Other abnormal glucose: Secondary | ICD-10-CM | POA: Diagnosis not present

## 2022-10-29 DIAGNOSIS — E782 Mixed hyperlipidemia: Secondary | ICD-10-CM

## 2022-10-29 DIAGNOSIS — E039 Hypothyroidism, unspecified: Secondary | ICD-10-CM

## 2022-10-29 DIAGNOSIS — R0989 Other specified symptoms and signs involving the circulatory and respiratory systems: Secondary | ICD-10-CM | POA: Diagnosis not present

## 2022-10-29 DIAGNOSIS — N1831 Chronic kidney disease, stage 3a: Secondary | ICD-10-CM

## 2022-10-29 DIAGNOSIS — Z79899 Other long term (current) drug therapy: Secondary | ICD-10-CM | POA: Diagnosis not present

## 2022-10-29 DIAGNOSIS — E559 Vitamin D deficiency, unspecified: Secondary | ICD-10-CM | POA: Diagnosis not present

## 2022-10-29 NOTE — Patient Instructions (Signed)

## 2022-10-29 NOTE — Progress Notes (Signed)
Future Appointments  Date Time Provider Department  10/29/2022                 6 mo   ov  2:30 PM Lucky Cowboy, MD [facility]  05/06/2023                   cpe              2:00 PM Lucky Cowboy, MD [facility]  08/09/2023                   OV & WELLNESS 11:00 AM Adela Glimpse, NP [facility]    History of Present Illness:      This very nice 89 y.o. [name]  with  HTN, HLD, Hypothyroidism,  Prediabetes,  IBS-D and Vitamin D Deficiency presents for 6  month follow up . Patient has Aortic atherosclerosis by LS spine XR  (11/2018). In Oct 2020, patient was dx'd with PMR treated with steroids and slow gradual taper finally d/c'd  about Sept /Oct 2021. LS X-ray in 2020 showed Aortic Atherosclerosis.       Patient has mild labile HTN & followed expectantly BP has been controlled at home. Today's BP is at goal - 136/78 .  Patient has CKD3 attributed to her HTCVD. Patient has had no complaints of any cardiac type chest pain, palpitations, dyspnea Pollyann Kennedy /PND, dizziness, claudication  or dependent edema.       Hyperlipidemia is controlled with diet & Simvastatin.  Patient denies myalgias or other med SE's. Last Lipids were at goal:  Lab Results  Component Value Date   CHOL 158 07/07/2022   HDL 61 07/07/2022   LDLCALC 77 07/07/2022   TRIG 122 07/07/2022   CHOLHDL 2.6 07/07/2022     Also, the patient is followed expectantly for glucose intolerance and has had no symptoms of reactive hypoglycemia, diabetic polys, paresthesias or visual blurring.  Last A1c was normal & at goal:  Lab Results  Component Value Date   HGBA1C 5.5 05/04/2022                                           Patient   was dx'd Hypothyroid in 2000 and has been on thyroid replacement since.                                                        Further, the patient also has history of Vitamin D Deficiency ("37" /2011) and supplements vitamin D without any suspected side-effects. Last vitamin D was at goal:  Lab  Results  Component Value Date   VD25OH 91 07/07/2022       Current Outpatient Medications  Medication Instructions   aspirin 81 MG EC  Take 1 tablet daily   busPIRone 10 MG tablet Take 1/2 to 1 tablet 3 x / day as Needed  (Replace Valium)   VITAMIN D   5,000 Units ,2 times daily   LOTRISONE cream 2 times daily    (Replaces Diazepam)   hyoscyamine 0.125 MG tablet Take  1 to 2 tablets  3 to 4 x /day   as needed    levothyroxine 50 MCG tablet 1 tab daily 5  days a week     PROBIOTIC 1 tablet  Daily   simvastatin  20 MG tablet TAKE 1 TABLET AT BEDTIME        Allergies  Allergen Reactions   Ciprofloxacin    Epinephrine Heart raced    Etodolac Hives   Synephrine Palpitations   Flagyl [Metronidazole Hcl] Rash     PMHx:   Past Medical History:  Diagnosis Date   Anxiety    Breast cancer (HCC)    left   Colon polyp 2009   BENIGN POLYPOID   Diverticulosis of colon (without mention of hemorrhage) 2009   DJD (degenerative joint disease)    Family history of malignant neoplasm of gastrointestinal tract 05/06/2011   HOH (hard of hearing)    Right ear   Hypercholesteremia    Hypothyroidism    IBS (irritable bowel syndrome)    Internal hemorrhoids without mention of complication    Primary osteoarthritis of right knee 11/26/2015   S/p TKA   Sinus drainage    Vitiligo    Wears dentures    top   Wears glasses      Immunization History  Administered Date(s) Administered   Influenza Split 12/01/2010   Influenza, High Dose  12/01/2017, 12/06/2018, 12/12/2019   Influenza 10/31/2012, 11/29/2014   PFIZER  SARS-COV-2 Vacc 05/13/2019, 06/06/2019   Pneumococcal-13 10/18/2013   Pneumococcal -23 03/20/2010   Td 10/17/2012, 01/12/2020     Past Surgical History:  Procedure Laterality Date   ABDOMINAL HYSTERECTOMY     bladder tac     BREAST LUMPECTOMY  2004   left lump-snbx   BUNIONECTOMY     bilateral   COLONOSCOPY  2009   several   CYSTOCELE REPAIR  2009   EYE SURGERY  Bilateral    Cataract removal   KNEE ARTHROSCOPY Left 01/17/2013   Procedure: ARTHROSCOPY LEFT KNEE;  Surgeon: Velna Ochs, MD;  Location: [facility];  Service: Orthopedics;  Laterality: Left;  partial medial and partial lateral chondroplasty and removal loose body   right shoulder  2003   bone spur removed-rcr   TONSILLECTOMY     TOTAL KNEE ARTHROPLASTY Right 11/26/2015   Procedure: TOTAL KNEE ARTHROPLASTY;  Surgeon: Marcene Corning, MD;  Location: [facility];  Service: Orthopedics;  Laterality: Right;   WRIST FRACTURE SURGERY  2003   left-lipoma    FHx:    Reviewed / unchanged  SHx:    Reviewed / unchanged   Systems Review:  Constitutional: Denies fever, chills, wt changes, headaches, insomnia, fatigue, night sweats, change in appetite. Eyes: Denies redness, blurred vision, diplopia, discharge, itchy, watery eyes.  ENT: Denies discharge, congestion, post nasal drip, epistaxis, sore throat, earache, hearing loss, dental pain, tinnitus, vertigo, sinus pain, snoring.  CV: Denies chest pain, palpitations, irregular heartbeat, syncope, dyspnea, diaphoresis, orthopnea, PND, claudication or edema. Respiratory: denies cough, dyspnea, DOE, pleurisy, hoarseness, laryngitis, wheezing.  Gastrointestinal: Denies dysphagia, odynophagia, heartburn, reflux, water brash, abdominal pain or cramps, nausea, vomiting, bloating, diarrhea, constipation, hematemesis, melena, hematochezia  or hemorrhoids. Genitourinary: Denies dysuria, frequency, urgency, nocturia, hesitancy, discharge, hematuria or flank pain. Musculoskeletal: Denies arthralgias, myalgias, stiffness, jt. swelling, pain, limping or strain/sprain.  Skin: Denies pruritus, rash, hives, warts, acne, eczema or change in skin lesion(s). Neuro: No weakness, tremor, incoordination, spasms, paresthesia or pain. Psychiatric: Denies confusion, memory loss or sensory loss. Endo: Denies change in weight, skin or hair change.  Heme/Lymph: No  excessive bleeding, bruising or enlarged lymph nodes.  Physical Exam  BP 136/78  Pulse 69   Temp 97.9 F (36.6 C)   Resp 17   Ht 5' 5.75" (1.67 m)   Wt 126 lb 3.2 oz (57.2 kg)   SpO2 99%   BMI 20.52 kg/m   Appears  well nourished, well groomed  and in no distress.  Eyes: PERRLA, EOMs, conjunctiva no swelling or erythema. Sinuses: No frontal/maxillary tenderness ENT/Mouth: EAC's clear, TM's nl w/o erythema, bulging. Nares clear w/o erythema, swelling, exudates. Oropharynx clear without erythema or exudates. Oral hygiene is good. Tongue normal, non obstructing. Hearing intact.  Neck: Supple. Thyroid not palpable. Car 2+/2+ without bruits, nodes or JVD. Chest: Respirations nl with BS clear & equal w/o rales, rhonchi, wheezing or stridor.  Cor: Heart sounds normal w/ regular rate and rhythm without sig. murmurs, gallops, clicks or rubs. Peripheral pulses normal and equal  without edema.  Abdomen: Soft & bowel sounds normal. Non-tender w/o guarding, rebound, hernias, masses or organomegaly.  Lymphatics: Unremarkable.  Musculoskeletal: Full ROM all peripheral extremities, joint stability, 5/5 strength and normal gait.  Skin: Warm, dry without exposed rashes, lesions or ecchymosis apparent.  Neuro: Cranial nerves intact, reflexes equal bilaterally. Sensory-motor testing grossly intact. Tendon reflexes grossly intact.  Pysch: Alert & oriented x 3.  Insight and judgement nl & appropriate. No ideations.  Assessment and Plan:   1. Labile hypertension  - Continue medication, monitor blood pressure at home.  - Continue DASH diet.  Reminder to go to the ER if any CP,  SOB, nausea, dizziness, severe HA, changes vision/speech.   - CBC with Differential/Platelet - COMPLETE METABOLIC PANEL WITH GFR - Magnesium - TSH  2. Hyperlipidemia, mixed  - Continue diet/meds, exercise,& lifestyle modifications.  - Continue monitor periodic cholesterol/liver & renal functions    - Lipid panel -  TSH   3. Abnormal glucose  - Continue diet, exercise  - Lifestyle modifications.  - Monitor appropriate labs   - Hemoglobin A1c - Insulin, random   4. Hypothyroidism  - TSH   5. Vitamin D deficiency  - Continue supplementation    - VITAMIN D 25 Hydroxy    6. Stage 3a chronic kidney disease (HCC)  - COMPLETE METABOLIC PANEL WITH GFR - Parathyroid hormone, intact (no Ca)   7. Aortic atherosclerosis (HCC) by LS spine XR 11/2018  - Lipid panel   8. Medication management  - CBC with Differential/Platelet - COMPLETE METABOLIC PANEL WITH GFR - Magnesium - Lipid panel - TSH - Hemoglobin A1c - Insulin, random - VITAMIN D 25 Hydroxy  - Parathyroid hormone, intact (no Ca)          Discussed  regular exercise, BP monitoring, weight control to achieve/maintain BMI less than 25 and discussed med and SE's. Recommended labs to assess and monitor clinical status with further disposition pending results of labs.  I discussed the assessment and treatment plan with the patient. The patient was provided an opportunity to ask questions and all were answered. The patient agreed with the plan and demonstrated an understanding of the instructions.  I provided over 30 minutes of exam, counseling, chart review and  complex critical decision making.         The patient was advised to call back or seek an in-person evaluation if the symptoms worsen or if the condition fails to improve as anticipated.   Marinus Maw, MD

## 2022-10-30 LAB — CBC WITH DIFFERENTIAL/PLATELET
Absolute Monocytes: 569 {cells}/uL (ref 200–950)
Basophils Absolute: 29 {cells}/uL (ref 0–200)
Basophils Relative: 0.4 %
Eosinophils Absolute: 79 {cells}/uL (ref 15–500)
Eosinophils Relative: 1.1 %
HCT: 36.2 % (ref 35.0–45.0)
Hemoglobin: 12.1 g/dL (ref 11.7–15.5)
Lymphs Abs: 2146 cells/uL (ref 850–3900)
MCH: 29.2 pg (ref 27.0–33.0)
MCHC: 33.4 g/dL (ref 32.0–36.0)
MCV: 87.2 fL (ref 80.0–100.0)
MPV: 10.3 fL (ref 7.5–12.5)
Monocytes Relative: 7.9 %
Neutro Abs: 4378 {cells}/uL (ref 1500–7800)
Neutrophils Relative %: 60.8 %
Platelets: 260 10*3/uL (ref 140–400)
RBC: 4.15 10*6/uL (ref 3.80–5.10)
RDW: 13.8 % (ref 11.0–15.0)
Total Lymphocyte: 29.8 %
WBC: 7.2 10*3/uL (ref 3.8–10.8)

## 2022-10-30 LAB — COMPLETE METABOLIC PANEL WITH GFR
AG Ratio: 2.1 (calc) (ref 1.0–2.5)
ALT: 11 U/L (ref 6–29)
AST: 16 U/L (ref 10–35)
Albumin: 4.4 g/dL (ref 3.6–5.1)
Alkaline phosphatase (APISO): 65 U/L (ref 37–153)
BUN: 17 mg/dL (ref 7–25)
CO2: 25 mmol/L (ref 20–32)
Calcium: 10 mg/dL (ref 8.6–10.4)
Chloride: 103 mmol/L (ref 98–110)
Creat: 0.85 mg/dL (ref 0.60–0.95)
Globulin: 2.1 g/dL (ref 1.9–3.7)
Glucose, Bld: 93 mg/dL (ref 65–99)
Potassium: 4 mmol/L (ref 3.5–5.3)
Sodium: 139 mmol/L (ref 135–146)
Total Bilirubin: 1.5 mg/dL — ABNORMAL HIGH (ref 0.2–1.2)
Total Protein: 6.5 g/dL (ref 6.1–8.1)
eGFR: 63 mL/min/{1.73_m2} (ref >=60)

## 2022-10-30 LAB — LIPID PANEL
Cholesterol: 168 mg/dL (ref <200)
HDL: 71 mg/dL (ref >=50)
LDL Cholesterol (Calc): 79 mg/dL
Non-HDL Cholesterol (Calc): 97 mg/dL (calc) (ref <130)
Total CHOL/HDL Ratio: 2.4 (calc) (ref <5.0)
Triglycerides: 98 mg/dL (ref <150)

## 2022-10-30 LAB — HEMOGLOBIN A1C
Hgb A1c MFr Bld: 5.5 %{Hb} (ref <5.7)
Mean Plasma Glucose: 111 mg/dL
eAG (mmol/L): 6.2 mmol/L

## 2022-10-30 LAB — TSH: TSH: 15.23 m[IU]/L — ABNORMAL HIGH (ref 0.40–4.50)

## 2022-10-30 LAB — INSULIN, RANDOM: Insulin: 4.5 u[IU]/mL

## 2022-10-30 LAB — PARATHYROID HORMONE, INTACT (NO CA): PTH: 51 pg/mL (ref 16–77)

## 2022-10-30 LAB — VITAMIN D 25 HYDROXY (VIT D DEFICIENCY, FRACTURES): Vit D, 25-Hydroxy: 84 ng/mL (ref 30–100)

## 2022-10-30 LAB — MAGNESIUM: Magnesium: 2 mg/dL (ref 1.5–2.5)

## 2022-10-31 ENCOUNTER — Other Ambulatory Visit: Payer: Self-pay | Admitting: Internal Medicine

## 2022-10-31 DIAGNOSIS — E039 Hypothyroidism, unspecified: Secondary | ICD-10-CM

## 2022-10-31 NOTE — Progress Notes (Signed)
<>*<>*<>*<>*<>*<>*<>*<>*<>*<>*<>*<>*<>*<>*<>*<>*<>*<>*<>*<>*<>*<>*<>*<>*<> <>*<>*<>*<>*<>*<>*<>*<>*<>*<>*<>*<>*<>*<>*<>*<>*<>*<>*<>*<>*<>*<>*<>*<>*<>  -  Test results slightly outside the reference range are not unusual. If there is anything important, I will review this with you,  otherwise it is considered normal test values.  If you have further questions,  please do not hesitate to contact me at the office or via My Chart.   <>*<>*<>*<>*<>*<>*<>*<>*<>*<>*<>*<>*<>*<>*<>*<>*<>*<>*<>*<>*<>*<>*<>*<>*<> <>*<>*<>*<>*<>*<>*<>*<>*<>*<>*<>*<>*<>*<>*<>*<>*<>*<>*<>*<>*<>*<>*<>*<>*<>  - High or elevated TSH  Means Thyroid Hormone is too Low in Blood, So   Please go back to taking    1 Whole Tablet every Day  &  And ABSOLUTELY PLEASE     DO NOT TAKE any SUPPLEMENTS with BIOTIN in it  ! ! !   - Please schedule a NV for TSH lab in 1 month   <>*<>*<>*<>*<>*<>*<>*<>*<>*<>*<>*<>*<>*<>*<>*<>*<>*<>*<>*<>*<>*<>*<>*<>*<>  - Chol = 168 -  Excellent   - Very low risk for Heart Attack  / Stroke  <>*<>*<>*<>*<>*<>*<>*<>*<>*<>*<>*<>*<>*<>*<>*<>*<>*<>*<>*<>*<>*<>*<>*<>*<>  -  A1c - Normal - No Diabetes  - Great  !   <>*<>*<>*<>*<>*<>*<>*<>*<>*<>*<>*<>*<>*<>*<>*<>*<>*<>*<>*<>*<>*<>*<>*<>*<>  -  Vitamin D = 84  also Excellent  - Please keep dose same   <>*<>*<>*<>*<>*<>*<>*<>*<>*<>*<>*<>*<>*<>*<>*<>*<>*<>*<>*<>*<>*<>*<>*<>*<>  - All Else - CBC - Kidneys - Electrolytes - Liver &  Magnesium    - all  Normal / OK  <>*<>*<>*<>*<>*<>*<>*<>*<>*<>*<>*<>*<>*<>*<>*<>*<>*<>*<>*<>*<>*<>*<>*<>*<> <>*<>*<>*<>*<>*<>*<>*<>*<>*<>*<>*<>*<>*<>*<>*<>*<>*<>*<>*<>*<>*<>*<>*<>*<>          -

## 2022-11-16 ENCOUNTER — Other Ambulatory Visit: Payer: Self-pay | Admitting: Internal Medicine

## 2022-11-16 MED ORDER — MIRTAZAPINE 15 MG PO TABS: ORAL_TABLET | ORAL | 3 refills | Status: AC

## 2022-12-03 ENCOUNTER — Ambulatory Visit (INDEPENDENT_AMBULATORY_CARE_PROVIDER_SITE_OTHER): Payer: Medicare HMO

## 2022-12-03 VITALS — BP 156/72 | HR 65 | Temp 97.7°F | Wt 126.2 lb

## 2022-12-03 DIAGNOSIS — Z23 Encounter for immunization: Secondary | ICD-10-CM | POA: Diagnosis not present

## 2022-12-03 DIAGNOSIS — E039 Hypothyroidism, unspecified: Secondary | ICD-10-CM | POA: Diagnosis not present

## 2022-12-03 NOTE — Progress Notes (Signed)
Patient presents to the office for a nurse visit to have labs done to recheck TSH levels and have the flu vaccine. Patient states that she is taking of Levothyroxine, one tablet daily with just water. Doesn't take a Biotin supplement.

## 2022-12-04 LAB — TSH: TSH: 9.06 m[IU]/L — ABNORMAL HIGH (ref 0.40–4.50)

## 2022-12-05 ENCOUNTER — Other Ambulatory Visit: Payer: Self-pay | Admitting: Internal Medicine

## 2022-12-05 DIAGNOSIS — E039 Hypothyroidism, unspecified: Secondary | ICD-10-CM

## 2022-12-05 MED ORDER — LEVOTHYROXINE SODIUM 75 MCG PO TABS
ORAL_TABLET | ORAL | 1 refills | Status: AC
Start: 2022-12-05 — End: ?

## 2023-01-06 ENCOUNTER — Ambulatory Visit: Payer: Medicare HMO

## 2023-01-06 DIAGNOSIS — E039 Hypothyroidism, unspecified: Secondary | ICD-10-CM

## 2023-01-06 NOTE — Progress Notes (Signed)
Patient presents to the office for a nurse visit to have labs done to recheck a TSH. Patient states that she is taking her Levothyroxine, 1 tablet daily. States that when she took the actual Synthroid, she wasn't having any problems with her thyroid.

## 2023-01-07 LAB — TSH: TSH: 4.93 m[IU]/L — ABNORMAL HIGH (ref 0.40–4.50)

## 2023-01-07 NOTE — Progress Notes (Signed)
<>*<>*<>*<>*<>*<>*<>*<>*<>*<>*<>*<>*<>*<>*<>*<>*<>*<>*<>*<>*<>*<>*<>*<>*<> <>*<>*<>*<>*<>*<>*<>*<>*<>*<>*<>*<>*<>*<>*<>*<>*<>*<>*<>*<>*<>*<>*<>*<>*<>  -    Thyroid much better now                           - Please stay on same dose for now & will recheck at next OV  <>*<>*<>*<>*<>*<>*<>*<>*<>*<>*<>*<>*<>*<>*<>*<>*<>*<>*<>*<>*<>*<>*<>*<>*<> <>*<>*<>*<>*<>*<>*<>*<>*<>*<>*<>*<>*<>*<>*<>*<>*<>*<>*<>*<>*<>*<>*<>*<>*<>  -

## 2023-02-01 DIAGNOSIS — M1712 Unilateral primary osteoarthritis, left knee: Secondary | ICD-10-CM | POA: Diagnosis not present

## 2023-02-02 ENCOUNTER — Ambulatory Visit: Payer: Medicare HMO | Admitting: Nurse Practitioner

## 2023-02-05 ENCOUNTER — Ambulatory Visit: Payer: Medicare HMO | Admitting: Nurse Practitioner

## 2023-05-06 ENCOUNTER — Encounter: Payer: Medicare HMO | Admitting: Internal Medicine

## 2023-05-21 DIAGNOSIS — R5383 Other fatigue: Secondary | ICD-10-CM | POA: Diagnosis not present

## 2023-05-21 DIAGNOSIS — G629 Polyneuropathy, unspecified: Secondary | ICD-10-CM | POA: Diagnosis not present

## 2023-05-25 ENCOUNTER — Encounter: Payer: Medicare HMO | Admitting: Internal Medicine

## 2023-05-28 DIAGNOSIS — M545 Low back pain, unspecified: Secondary | ICD-10-CM | POA: Diagnosis not present

## 2023-05-28 DIAGNOSIS — F419 Anxiety disorder, unspecified: Secondary | ICD-10-CM | POA: Diagnosis not present

## 2023-05-28 DIAGNOSIS — G629 Polyneuropathy, unspecified: Secondary | ICD-10-CM | POA: Diagnosis not present

## 2023-06-02 DIAGNOSIS — M545 Low back pain, unspecified: Secondary | ICD-10-CM | POA: Diagnosis not present

## 2023-06-02 DIAGNOSIS — R350 Frequency of micturition: Secondary | ICD-10-CM | POA: Diagnosis not present

## 2023-08-09 ENCOUNTER — Ambulatory Visit: Payer: Medicare HMO | Admitting: Nurse Practitioner

## 2023-08-12 DIAGNOSIS — H9201 Otalgia, right ear: Secondary | ICD-10-CM | POA: Diagnosis not present

## 2023-08-16 DIAGNOSIS — M545 Low back pain, unspecified: Secondary | ICD-10-CM | POA: Diagnosis not present

## 2023-08-31 ENCOUNTER — Ambulatory Visit: Payer: Medicare HMO | Admitting: Nurse Practitioner

## 2023-10-11 ENCOUNTER — Encounter: Payer: Self-pay | Admitting: Neurology

## 2023-10-11 ENCOUNTER — Ambulatory Visit: Admitting: Neurology

## 2023-10-11 VITALS — BP 154/70 | HR 56 | Wt 125.8 lb

## 2023-10-11 DIAGNOSIS — R269 Unspecified abnormalities of gait and mobility: Secondary | ICD-10-CM | POA: Diagnosis not present

## 2023-10-11 DIAGNOSIS — M792 Neuralgia and neuritis, unspecified: Secondary | ICD-10-CM | POA: Insufficient documentation

## 2023-10-11 MED ORDER — GABAPENTIN 100 MG PO CAPS: 100.0000 mg | ORAL_CAPSULE | Freq: Three times a day (TID) | ORAL | 11 refills | Status: DC | PRN

## 2023-10-11 MED ORDER — LIDOCAINE-PRILOCAINE 2.5-2.5 % EX CREA: TOPICAL_CREAM | CUTANEOUS | 11 refills | Status: AC

## 2023-10-11 NOTE — Progress Notes (Signed)
 Chief Complaint  Patient presents with   Establish Care    Room 16, in room with daughter, Neuropathy in the left ankle and leg,  feels like burning, numbness, and tingling, occurs mostly when lying down, intermittent, feels as if it may be getting worse. Lower back feels as though theres a knot as if theres a bulging disc.       ASSESSMENT AND PLAN  Alexandria Davies is a 89 y.o. female   Slow Worsening gait abnormality Left lateral leg neuropathic pain allodynia since her injury in 2020, in the territory of left superficial peroneal nerve  Most worrisome for cervical spondylitic myelopathy with superimposed lumbar radiculopathy with her worsening gait abnormality  MRI of cervical and lumbar spine  Gabapentin  low-dose 100 mg 3 times daily as needed for left leg neuropathic pain, likely due to left superficial peroneal nerve injury, Emla  gel  Return to clinic in few months to review MRI findings  She does not want to do physical therapy, emphasized importance of walker to avoid further injury from fall  DIAGNOSTIC DATA (LABS, IMAGING, TESTING) - I reviewed patient records, labs, notes, testing and imaging myself where available.   MEDICAL HISTORY:  Alexandria Davies is a 89 year old female, seen in request by  her primary care on [place] nurse practitioner Barbra Odor for evaluation of gait abnormality, left lateral neck pain, she is companied by her daughter at initial visit October 11, 2023  History is obtained from the patient and review of electronic medical records. I personally reviewed pertinent available imaging films in PACS.   PMHx of  Hypothyroidism HLD Depression, anxiety Left breast cancer, s/p lobectomy in 2004, radiation therapy HX of right knee replacement surgery in 2018, did well  She is still active, independent in her living, lives with her husband, still driving, in [address], trimming bushes, she fell, cuts left lateral leg, since then, she had  numbness at left lateral leg below left knee, allodynia, burning stinging sensation, also complains of left knee pain, potential candidate for placement  She has slow worsening gait abnormality, also has urinary urgency, worsening low back pain since she fell landed on her back in February 2025  PHYSICAL EXAM:   Vitals:   10/11/23 1347  BP: (!) 154/70  Pulse: (!) 56  Weight: 125 lb 12.8 oz (57.1 kg)   Body mass index is 20.46 kg/m.  PHYSICAL EXAMNIATION:  Gen: NAD, conversant, well nourised, well groomed                     Cardiovascular: Regular rate rhythm, no peripheral edema, warm, nontender. Eyes: Conjunctivae clear without exudates or hemorrhage Neck: Supple, no carotid bruits. Pulmonary: Clear to auscultation bilaterally   NEUROLOGICAL EXAM:  MENTAL STATUS: Speech/cognition: Awake, alert, oriented to history taking and casual conversation CRANIAL NERVES: CN II: Visual fields are full to confrontation. Pupils are round equal and briskly reactive to light. CN III, IV, VI: extraocular movement are normal. No ptosis. CN V: Facial sensation is intact to light touch CN VII: Face is symmetric with normal eye closure  CN VIII: Hearing is normal to causal conversation. CN IX, X: Phonation is normal. CN XI: Head turning and shoulder shrug are intact  MOTOR: Moderate intrinsic hand muscle atrophy, moderate bilateral finger abduction weakness, mild to moderate bilateral hip flexion ankle dorsiflexion weakness  REFLEXES: Reflexes are 2+ and symmetric at the biceps, triceps, brisk at knee levels, and absent at ankles. Plantar responses are sensory  bilaterally  SENSORY: Mildly length-dependent decreased light touch, pinprick vibratory sensation  COORDINATION: There is no trunk or limb dysmetria noted.  GAIT/STANCE: Push-up to get up from seated position, wide-based, cautious very unsteady gait  REVIEW OF SYSTEMS:  Full 14 system review of systems performed and notable only  for as above All other review of systems were negative.   ALLERGIES: Allergies  Allergen Reactions   Ciprofloxacin     Epinephrine  Other (See Comments)    Heart raced    Etodolac Hives   Lyrica [Pregabalin]    Synephrine     Palpitations    Flagyl  [Metronidazole  Hcl] Rash    HOME MEDICATIONS: Current Outpatient Medications  Medication Sig Dispense Refill   aspirin  (ASPIRIN  81) 81 MG EC tablet Take 1 tablet daily     busPIRone  (BUSPAR ) 10 MG tablet Take 1/2 to 1 tablet 3 x / day as Needed for Anxiety or Nerves  ( to Replace Valium  ) 90 tablet 0   Cholecalciferol (VITAMIN D  PO) Take 5,000 Units by mouth 2 (two) times daily.     clotrimazole -betamethasone  (LOTRISONE ) cream Apply 1 Application topically 2 (two) times daily. (Patient taking differently: Apply 1 Application topically 2 (two) times daily. Taking as needed) 30 g 1   levothyroxine  (SYNTHROID ) 75 MCG tablet Take  1 tablet  Daily  on an empty stomach with only water for 30 minutes & no Antacid meds, Calcium or Magnesium for 4 hours & avoid Biotin 90 tablet 1   mirtazapine  (REMERON ) 15 MG tablet Take  1 tablet 1 to 2 hours before  Bedtime for Sleep & Appetite 90 tablet 3   Probiotic Product (PROBIOTIC PO) Take 1 tablet by mouth daily.     simvastatin  (ZOCOR ) 20 MG tablet TAKE 1 TABLET AT BEDTIME   FOR CHOLESTEROL 90 tablet 3   hyoscyamine  (LEVSIN ) 0.125 MG tablet Take  1 to 2 tablets  3 to 4 x /day  (every 4 to 6 hours) as needed for Nausea, Cramping or Diarrhea (Patient not taking: Reported on 10/11/2023) 120 tablet 0   No current facility-administered medications for this visit.    PAST MEDICAL HISTORY: Past Medical History:  Diagnosis Date   Anxiety    Breast cancer (HCC)    left   Colon polyp 2009   BENIGN POLYPOID   Diverticulosis of colon (without mention of hemorrhage) 2009   DJD (degenerative joint disease)    Family history of malignant neoplasm of gastrointestinal tract 05/06/2011   HOH (hard of hearing)     Right ear   Hypercholesteremia    Hypothyroidism    IBS (irritable bowel syndrome)    Internal hemorrhoids without mention of complication    Primary osteoarthritis of right knee 11/26/2015   S/p TKA   Sinus drainage    Vitiligo    Wears dentures    top   Wears glasses     PAST SURGICAL HISTORY: Past Surgical History:  Procedure Laterality Date   ABDOMINAL HYSTERECTOMY     bladder tac     BREAST LUMPECTOMY  2004   left lump-snbx   BUNIONECTOMY     bilateral   COLONOSCOPY  2009   several   CYSTOCELE REPAIR  2009   EYE SURGERY Bilateral    Cataract removal   KNEE ARTHROSCOPY Left 01/17/2013   Procedure: ARTHROSCOPY LEFT KNEE;  Surgeon: Maude KANDICE Herald, MD;  Location: [facility];  Service: Orthopedics;  Laterality: Left;  partial medial and partial lateral chondroplasty and removal  loose body   right shoulder  2003   bone spur removed-rcr   TONSILLECTOMY     TOTAL KNEE ARTHROPLASTY Right 11/26/2015   Procedure: TOTAL KNEE ARTHROPLASTY;  Surgeon: Maude Herald, MD;  Location: [facility];  Service: Orthopedics;  Laterality: Right;   WRIST FRACTURE SURGERY  2003   left-lipoma    FAMILY HISTORY: Family History  Problem Relation Age of Onset   Heart disease Mother        died at age 35   Alzheimer's disease Mother    Testicular cancer Father    Alzheimer's disease Sister    Cancer Sister 78       breast   Breast cancer Sister    Cancer Sister 59       breast cancer   Breast cancer Daughter 74   Colon cancer Son    Breast cancer Niece    Breast cancer Niece     SOCIAL HISTORY: Social History   Socioeconomic History   Marital status: Married    Spouse name: Not on file   Number of children: Not on file   Years of education: Not on file   Highest education level: Not on file  Occupational History   Occupation: Retired  Tobacco Use   Smoking status: Never   Smokeless tobacco: Never  Vaping Use   Vaping status: Never Used  Substance and Sexual  Activity   Alcohol use: Not Currently    Comment: ocassionally   Drug use: No   Sexual activity: Not on file  Other Topics Concern   Not on file  Social History Narrative   Not on file   Social Drivers of Health   Financial Resource Strain: Not on file  Food Insecurity: Not on file  Transportation Needs: Not on file  Physical Activity: Not on file  Stress: Not on file  Social Connections: Not on file  Intimate Partner Violence: Not on file      Modena Callander, M.D. Ph.D.  [facility] [address] [place], [place] [address] Ph: [phone] Fax: [phone]  CC:  Barbra Odor, NP [address] [place],  [place] [address]  Barbra Odor, NP

## 2023-10-13 NOTE — Progress Notes (Deleted)
 Office Visit Note  Patient: Alexandria Davies             Date of Birth: 02-Mar-1926           MRN: [id number]             PCP: Barbra Odor, NP Referring: Sheril Coy, MD Visit Date: 10/27/2023 Occupation: @GUAROCC @  Subjective:  No chief complaint on file.   History of Present Illness: Alexandria Davies is a 89 y.o. female ***     Activities of Daily Living:  Patient reports morning stiffness for *** {minute/hour:19697}.   Patient {ACTIONS;DENIES/REPORTS:21021675::Denies} nocturnal pain.  Difficulty dressing/grooming: {ACTIONS;DENIES/REPORTS:21021675::Denies} Difficulty climbing stairs: {ACTIONS;DENIES/REPORTS:21021675::Denies} Difficulty getting out of chair: {ACTIONS;DENIES/REPORTS:21021675::Denies} Difficulty using hands for taps, buttons, cutlery, and/or writing: {ACTIONS;DENIES/REPORTS:21021675::Denies}  No Rheumatology ROS completed.   PMFS History:  Patient Active Problem List   Diagnosis Date Noted   Neuropathic pain 10/11/2023   Gait abnormality 10/11/2023   Aortic atherosclerosis (HCC) by LS spine XR 11/2018 02/06/2020   Major depression in remission (HCC) 10/16/2019   CKD (chronic kidney disease) stage 3, GFR 30-59 ml/min (HCC) 07/15/2019   PMR (polymyalgia rheumatica) (HCC) 10/26/2018   Insomnia 10/22/2014   Generalized anxiety disorder 10/22/2014   Labile hypertension 02/14/2014   Hyperlipidemia, mixed 02/14/2014   Vitamin D  deficiency 02/14/2014   Rhinitis, allergic 08/19/2011   Diverticulosis 04/23/2011   Colon polyps 04/23/2011   PERSONAL HISTORY OF MALIGNANT NEOPLASM OF BREAST 12/07/2007   Hypothyroidism 12/05/2007   Osteoarthritis 12/05/2007   Internal hemorrhoids 11/08/2002    Past Medical History:  Diagnosis Date   Anxiety    Breast cancer (HCC)    left   Colon polyp 2009   BENIGN POLYPOID   Diverticulosis of colon (without mention of hemorrhage) 2009   DJD (degenerative joint disease)    Family history of malignant  neoplasm of gastrointestinal tract 05/06/2011   HOH (hard of hearing)    Right ear   Hypercholesteremia    Hypothyroidism    IBS (irritable bowel syndrome)    Internal hemorrhoids without mention of complication    Primary osteoarthritis of right knee 11/26/2015   S/p TKA   Sinus drainage    Vitiligo    Wears dentures    top   Wears glasses     Family History  Problem Relation Age of Onset   Heart disease Mother        died at age 48   Alzheimer's disease Mother    Testicular cancer Father    Alzheimer's disease Sister    Cancer Sister 48       breast   Breast cancer Sister    Cancer Sister 37       breast cancer   Breast cancer Daughter 42   Colon cancer Son    Breast cancer Niece    Breast cancer Niece    Past Surgical History:  Procedure Laterality Date   ABDOMINAL HYSTERECTOMY     bladder tac     BREAST LUMPECTOMY  2004   left lump-snbx   BUNIONECTOMY     bilateral   COLONOSCOPY  2009   several   CYSTOCELE REPAIR  2009   EYE SURGERY Bilateral    Cataract removal   KNEE ARTHROSCOPY Left 01/17/2013   Procedure: ARTHROSCOPY LEFT KNEE;  Surgeon: Coy KANDICE Sheril, MD;  Location: [facility];  Service: Orthopedics;  Laterality: Left;  partial medial and partial lateral chondroplasty and removal loose body   right shoulder  2003   bone spur removed-rcr   TONSILLECTOMY     TOTAL KNEE ARTHROPLASTY Right 11/26/2015   Procedure: TOTAL KNEE ARTHROPLASTY;  Surgeon: Maude Herald, MD;  Location: [facility];  Service: Orthopedics;  Laterality: Right;   WRIST FRACTURE SURGERY  2003   left-lipoma   Social History   Social History Narrative   Not on file   Immunization History  Administered Date(s) Administered   Influenza Split 12/01/2010   Influenza, High Dose Seasonal PF 11/12/2015, 11/23/2016, 12/01/2017, 12/06/2018, 12/12/2019, 12/03/2020, 12/03/2022   Influenza-Unspecified 10/31/2012, 11/29/2014   PFIZER(Purple Top)SARS-COV-2 Vaccination 05/13/2019,  06/06/2019   PNEUMOCOCCAL CONJUGATE-20 01/12/2022   Pneumococcal Conjugate-13 10/18/2013   Pneumococcal Polysaccharide-23 03/20/2010   Td 10/17/2012, 01/12/2020     Objective: Vital Signs: There were no vitals taken for this visit.   Physical Exam   Musculoskeletal Exam: ***  CDAI Exam: CDAI Score: -- Patient Global: --; Provider Global: -- Swollen: --; Tender: -- Joint Exam 10/27/2023   No joint exam has been documented for this visit   There is currently no information documented on the homunculus. Go to the Rheumatology activity and complete the homunculus joint exam.  Investigation: No additional findings.  Imaging: No results found.  Recent Labs: Lab Results  Component Value Date   WBC 7.2 10/29/2022   HGB 12.1 10/29/2022   PLT 260 10/29/2022   NA 139 10/29/2022   K 4.0 10/29/2022   CL 103 10/29/2022   CO2 25 10/29/2022   GLUCOSE 93 10/29/2022   BUN 17 10/29/2022   CREATININE 0.85 10/29/2022   BILITOT 1.5 (H) 10/29/2022   ALKPHOS 62 04/23/2016   AST 16 10/29/2022   ALT 11 10/29/2022   PROT 6.5 10/29/2022   ALBUMIN 4.2 04/23/2016   CALCIUM 10.0 10/29/2022   GFRAA 69 08/13/2020    Speciality Comments: No specialty comments available.  Procedures:  No procedures performed Allergies: Ciprofloxacin , Epinephrine , Etodolac, Lyrica [pregabalin], Synephrine, and Flagyl  [metronidazole  hcl]   Assessment / Plan:     Visit Diagnoses: No diagnosis found.  Orders: No orders of the defined types were placed in this encounter.  No orders of the defined types were placed in this encounter.   Face-to-face time spent with patient was *** minutes. Greater than 50% of time was spent in counseling and coordination of care.  Follow-Up Instructions: No follow-ups on file.   Maya Nash, MD  Note - This record has been created using Animal nutritionist.  Chart creation errors have been sought, but may not always  have been located. Such creation errors do not  reflect on  the standard of medical care.

## 2023-10-25 ENCOUNTER — Telehealth: Payer: Self-pay | Admitting: Neurology

## 2023-10-25 DIAGNOSIS — R269 Unspecified abnormalities of gait and mobility: Secondary | ICD-10-CM

## 2023-10-25 DIAGNOSIS — M792 Neuralgia and neuritis, unspecified: Secondary | ICD-10-CM

## 2023-10-25 MED ORDER — OXCARBAZEPINE 150 MG PO TABS: 75.0000 mg | ORAL_TABLET | Freq: Two times a day (BID) | ORAL | 5 refills | Status: DC

## 2023-10-25 NOTE — Telephone Encounter (Signed)
 Pt called stating she was Placed on gabapentin  (NEURONTIN ) 100 MG capsule  , Pt states that when she took medication she notice that  her feel and ankles started to swell UP . Pt is concern and wants to know if she should continue to take medication .

## 2023-10-25 NOTE — Telephone Encounter (Signed)
 Call to patient, she is no at home. Husband will have her return call to the office.

## 2023-10-25 NOTE — Telephone Encounter (Signed)
 Pt has returned call to RN

## 2023-10-25 NOTE — Addendum Note (Signed)
 Addended by: JOSHUA IZETTA CROME on: 10/25/2023 04:29 PM   Modules accepted: Orders

## 2023-10-25 NOTE — Addendum Note (Signed)
 Addended by: Kriss Ishler on: 10/25/2023 06:04 PM   Modules accepted: Orders

## 2023-10-25 NOTE — Telephone Encounter (Signed)
 Call to patient who reports since taking the gabapentin , she has had swelling in her feet and ankles and it has not helped her neuropathic pain/sciatica. She reports that she is going to stop taking the gabapentin . She is wanting to know what can be done, She does have an upcoming MRI and I reviewed the note and she doesn't want to to PT because of previous knee surgeries. Advised I would send to Dr. Onita for advise and recommendations

## 2023-10-25 NOTE — Telephone Encounter (Signed)
 Meds ordered this encounter  Medications   OXcarbazepine  (TRILEPTAL ) 150 MG tablet    Sig: Take 0.5 tablets (75 mg total) by mouth 2 (two) times daily.    Dispense:  30 tablet    Refill:  5

## 2023-10-26 NOTE — Telephone Encounter (Signed)
 Pt called returning Phone cal , Inform pt she will get call back

## 2023-10-26 NOTE — Telephone Encounter (Signed)
 Called and spoke to pt and stated that the provider wanted to switch from gabapentin  to trileptal  due to swelling. Pt voiced gratitude and understanding.

## 2023-10-26 NOTE — Telephone Encounter (Signed)
 Lvm 1st attempt by hf 10/26/23

## 2023-10-27 ENCOUNTER — Encounter: Admitting: Rheumatology

## 2023-10-27 DIAGNOSIS — R0989 Other specified symptoms and signs involving the circulatory and respiratory systems: Secondary | ICD-10-CM

## 2023-10-27 DIAGNOSIS — M19041 Primary osteoarthritis, right hand: Secondary | ICD-10-CM

## 2023-10-27 DIAGNOSIS — M5030 Other cervical disc degeneration, unspecified cervical region: Secondary | ICD-10-CM

## 2023-10-27 DIAGNOSIS — M353 Polymyalgia rheumatica: Secondary | ICD-10-CM

## 2023-10-27 DIAGNOSIS — F325 Major depressive disorder, single episode, in full remission: Secondary | ICD-10-CM

## 2023-10-27 DIAGNOSIS — Z96651 Presence of right artificial knee joint: Secondary | ICD-10-CM

## 2023-10-27 DIAGNOSIS — E782 Mixed hyperlipidemia: Secondary | ICD-10-CM

## 2023-10-27 DIAGNOSIS — E559 Vitamin D deficiency, unspecified: Secondary | ICD-10-CM

## 2023-10-27 DIAGNOSIS — M65342 Trigger finger, left ring finger: Secondary | ICD-10-CM

## 2023-10-27 DIAGNOSIS — K5790 Diverticulosis of intestine, part unspecified, without perforation or abscess without bleeding: Secondary | ICD-10-CM

## 2023-10-27 DIAGNOSIS — M19042 Primary osteoarthritis, left hand: Secondary | ICD-10-CM

## 2023-10-27 DIAGNOSIS — M47816 Spondylosis without myelopathy or radiculopathy, lumbar region: Secondary | ICD-10-CM

## 2023-10-27 DIAGNOSIS — J3089 Other allergic rhinitis: Secondary | ICD-10-CM

## 2023-10-27 DIAGNOSIS — G4709 Other insomnia: Secondary | ICD-10-CM

## 2023-10-27 DIAGNOSIS — M792 Neuralgia and neuritis, unspecified: Secondary | ICD-10-CM

## 2023-10-27 DIAGNOSIS — M1712 Unilateral primary osteoarthritis, left knee: Secondary | ICD-10-CM

## 2023-10-27 DIAGNOSIS — G8929 Other chronic pain: Secondary | ICD-10-CM

## 2023-10-27 DIAGNOSIS — N1831 Chronic kidney disease, stage 3a: Secondary | ICD-10-CM

## 2023-10-27 DIAGNOSIS — F411 Generalized anxiety disorder: Secondary | ICD-10-CM

## 2023-10-27 DIAGNOSIS — M4156 Other secondary scoliosis, lumbar region: Secondary | ICD-10-CM

## 2023-10-27 DIAGNOSIS — K648 Other hemorrhoids: Secondary | ICD-10-CM

## 2023-10-27 DIAGNOSIS — Z853 Personal history of malignant neoplasm of breast: Secondary | ICD-10-CM

## 2023-10-29 ENCOUNTER — Encounter: Payer: Self-pay | Admitting: Neurology

## 2023-10-29 ENCOUNTER — Other Ambulatory Visit

## 2023-11-11 ENCOUNTER — Ambulatory Visit
Admission: RE | Admit: 2023-11-11 | Discharge: 2023-11-11 | Disposition: A | Discharge status: Home / Self Care | Source: Ambulatory Visit | Attending: Neurology | Admitting: Neurology

## 2023-11-11 DIAGNOSIS — M792 Neuralgia and neuritis, unspecified: Secondary | ICD-10-CM

## 2023-11-11 DIAGNOSIS — R269 Unspecified abnormalities of gait and mobility: Secondary | ICD-10-CM | POA: Diagnosis not present

## 2023-11-15 ENCOUNTER — Telehealth: Payer: Self-pay | Admitting: Neurology

## 2023-11-15 NOTE — Telephone Encounter (Signed)
 Please call patient MRI of cervical spine showed significant spinal canal stenosis at 6 7, C7-T1, evidence of a hazy spinal cord signal abnormality from C5-T1  MRI of lumbar spine showed multilevel degenerative changes, severe spinal canal stenosis L4-5, moderate L3-4,  Please set up virtual visit with patient and her family to go over MRI findings in detail  IMPRESSION:    MRI cervical spine (without) demonstrating: - The spinal cord is notable for hazy T2 / STIR hyperintense signal within the spinal cord from C5 down to T1-2 level consistent with myelomalacia; associated spinal stenosis at C6-7 and C7-T1 levels as below. - At C6-7 disc bulging and facet hypertrophy with severe spinal stenosis and severe bilateral foraminal stenosis - At C7-T1 pseudo disc bulging and facet hypertrophy with moderate spinal stenosis and severe bilateral foraminal stenosis - At T1-2 disc bulging facet hypertrophy with severe bilateral foraminal stenosis - At C3-4 uncovertebral joint and facet hypertrophy with severe bilateral foraminal stenosis - At C4-5 disc bulging sedimentary with severe bilateral foraminal stenosis - At C5-6 disc bulging and facet hypertrophy with severe bilateral foraminal stenosis   IMPRESSION:    MRI lumbar spine without contrast demonstrating: - Multilevel degenerative spondylosis, disc bulging, endplate disease, and scoliosis. - At L4-5: Disc bulging and facet hypertrophy with severe spinal stenosis and severe bilateral foraminal stenosis. - At L3-4: Disc bulging and facet hypertrophy with moderate spinal stenosis and moderate bilateral foraminal stenosis. - At L5-S1: Severe disc bulging and facet hypertrophy with mild spinal stenosis and moderate right and severe left foraminal stenosis.

## 2023-11-16 NOTE — Telephone Encounter (Signed)
 Call to patient, no answer. Left message to call back.  Call to daughter sent mychart link to activate and reviewed results. Daughter verbalized understanding. VV scheduled to review results with Dr. Onita for 9/22, aware they are worked in

## 2023-11-22 ENCOUNTER — Telehealth (INDEPENDENT_AMBULATORY_CARE_PROVIDER_SITE_OTHER): Admitting: Neurology

## 2023-11-22 ENCOUNTER — Encounter: Payer: Self-pay | Admitting: Neurology

## 2023-11-22 VITALS — Ht 65.5 in

## 2023-11-22 DIAGNOSIS — M5416 Radiculopathy, lumbar region: Secondary | ICD-10-CM | POA: Insufficient documentation

## 2023-11-22 DIAGNOSIS — G959 Disease of spinal cord, unspecified: Secondary | ICD-10-CM | POA: Insufficient documentation

## 2023-11-22 DIAGNOSIS — M792 Neuralgia and neuritis, unspecified: Secondary | ICD-10-CM

## 2023-11-22 DIAGNOSIS — M545 Low back pain, unspecified: Secondary | ICD-10-CM | POA: Diagnosis not present

## 2023-11-22 DIAGNOSIS — R269 Unspecified abnormalities of gait and mobility: Secondary | ICD-10-CM | POA: Diagnosis not present

## 2023-11-22 MED ORDER — OXCARBAZEPINE 150 MG PO TABS: 150.0000 mg | ORAL_TABLET | Freq: Two times a day (BID) | ORAL | 5 refills | Status: AC | PRN

## 2023-11-22 NOTE — Progress Notes (Signed)
 Chief Complaint  Patient presents with   Results      ASSESSMENT AND PLAN  Alexandria Davies is a 89 y.o. female   Slow Worsening gait abnormality Left lateral leg neuropathic pain, allodynia since her leg injury in 2020, in the territory of left superficial peroneal nerve Low back pain,  MRI of cervical spine showed cervical myelopathy, myelomalacia signal abnormality at C5-T1, severe spinal canal stenosis at C6-7 and C7-T1   MRI of lumbar spine also showed multilevel degenerative changes, severe spinal stenosis L4-5, with severe biforaminal narrowing, moderate canal stenosis L3-4, moderate canal stenosis, L5-S1 showed severe left moderate right foraminal narrowing  Above finding will certainly explain her gait abnormality, she does not want to consider surgical intervention, She does have worsening low back pain, left or left pain, we will refer her to [facility], for potential epidural injection Try higher dose of Trileptal  150 mg twice a day as needed, understanding potential risk of increased dizziness unsteady gait Convince patient to use walker all the time to avoid fall, also referred to home physical therapy    DIAGNOSTIC DATA (LABS, IMAGING, TESTING) - I reviewed patient records, labs, notes, testing and imaging myself where available.   MEDICAL HISTORY:  Alexandria Davies is a 89 year old female, seen in request by  her primary care on [place] nurse practitioner Barbra Odor for evaluation of gait abnormality, left lateral neck pain, she is companied by her daughter at initial visit October 11, 2023  History is obtained from the patient and review of electronic medical records. I personally reviewed pertinent available imaging films in PACS.   PMHx of  Hypothyroidism HLD Depression, anxiety Left breast cancer, s/p lobectomy in 2004, radiation therapy HX of right knee replacement surgery in 2018, did well  She is still  active, independent in her living, lives with her husband, still driving, in [address], trimming bushes, she fell, cuts left lateral leg, since then, she had numbness at left lateral leg below left knee, allodynia, burning stinging sensation, also complains of left knee pain, potential candidate for placement  She has slow worsening gait abnormality, also has urinary urgency, worsening low back pain since she fell landed on her back in February 2025  Virtual Visit via Video Note  I connected with Alexandria Davies on 11/22/23 at  1:15 PM EDT by a video enabled telemedicine application and verified that I am speaking with the correct person using two identifiers.  Location: Patient: With her daughter Dagoberto Search at home Provider: Office   I discussed the limitations of evaluation and management by telemedicine and the availability of in person appointments. The patient expressed understanding and agreed to proceed.  History of Present Illness: Patient continues to have left leg pain, tolerating low-dose Trileptal  150 mg half tablets twice a day, but the benefit is short lasting, she continues to have gait abnormality, furniture walking at home, daughter is very much concerned about her fall risk, patient does not like to walk with her walker  We reviewed MRI of cervical and lumbar spine from November 14, 2023 via screen share, MRI of cervical spine multilevel degenerative changes, congenital small central canal, hazy T2/FLAIR signal abnormality within the cord from C5 down to T1 to level with myelomalacia, with associated severe spinal stenosis at C6-7 C7 and T1, variable degree of foraminal narrowing  MRI of lumbar spine multilevel degenerative changes, severe canal stenosis L4-5 severe bilateral foraminal stenosis, moderate stenosis and bilateral foraminal narrowing C3-4,  L5-S1, severe disc bulging, facet hypertrophy, moderate right severe left foraminal narrowing  She does have significant  left lower extremity neuropathic pain, also complains of low back pain tends to stay on the left side  Observations/Objective:  Awake, alert, oriented to history taking and casual conversation, facial symmetric, no dysarthria, no aphasia, moving upper extremity without difficulty,  REVIEW OF SYSTEMS:  Full 14 system review of systems performed and notable only for as above All other review of systems were negative.   ALLERGIES: Allergies  Allergen Reactions   Ciprofloxacin     Epinephrine  Other (See Comments)    Heart raced    Etodolac Hives   Lyrica [Pregabalin]    Synephrine     Palpitations    Flagyl  [Metronidazole  Hcl] Rash    HOME MEDICATIONS: Current Outpatient Medications  Medication Sig Dispense Refill   aspirin  (ASPIRIN  81) 81 MG EC tablet Take 1 tablet daily     busPIRone  (BUSPAR ) 10 MG tablet Take 1/2 to 1 tablet 3 x / day as Needed for Anxiety or Nerves  ( to Replace Valium  ) 90 tablet 0   Cholecalciferol (VITAMIN D  PO) Take 5,000 Units by mouth 2 (two) times daily.     clotrimazole -betamethasone  (LOTRISONE ) cream Apply 1 Application topically 2 (two) times daily. 30 g 1   hyoscyamine  (LEVSIN ) 0.125 MG tablet Take  1 to 2 tablets  3 to 4 x /day  (every 4 to 6 hours) as needed for Nausea, Cramping or Diarrhea 120 tablet 0   levothyroxine  (SYNTHROID ) 75 MCG tablet Take  1 tablet  Daily  on an empty stomach with only water for 30 minutes & no Antacid meds, Calcium or Magnesium for 4 hours & avoid Biotin 90 tablet 1   lidocaine -prilocaine  (EMLA ) cream 1 gram tid prn 30 g 11   mirtazapine  (REMERON ) 15 MG tablet Take  1 tablet 1 to 2 hours before  Bedtime for Sleep & Appetite 90 tablet 3   OXcarbazepine  (TRILEPTAL ) 150 MG tablet Take 0.5 tablets (75 mg total) by mouth 2 (two) times daily. 30 tablet 5   Probiotic Product (PROBIOTIC PO) Take 1 tablet by mouth daily.     simvastatin  (ZOCOR ) 20 MG tablet TAKE 1 TABLET AT BEDTIME   FOR CHOLESTEROL 90 tablet 3   No current  facility-administered medications for this visit.    PAST MEDICAL HISTORY: Past Medical History:  Diagnosis Date   Anxiety    Breast cancer (HCC)    left   Colon polyp 2009   BENIGN POLYPOID   Diverticulosis of colon (without mention of hemorrhage) 2009   DJD (degenerative joint disease)    Family history of malignant neoplasm of gastrointestinal tract 05/06/2011   HOH (hard of hearing)    Right ear   Hypercholesteremia    Hypothyroidism    IBS (irritable bowel syndrome)    Internal hemorrhoids without mention of complication    Primary osteoarthritis of right knee 11/26/2015   S/p TKA   Sinus drainage    Vitiligo    Wears dentures    top   Wears glasses     PAST SURGICAL HISTORY: Past Surgical History:  Procedure Laterality Date   ABDOMINAL HYSTERECTOMY     bladder tac     BREAST LUMPECTOMY  2004   left lump-snbx   BUNIONECTOMY     bilateral   COLONOSCOPY  2009   several   CYSTOCELE REPAIR  2009   EYE SURGERY Bilateral    Cataract removal  KNEE ARTHROSCOPY Left 01/17/2013   Procedure: ARTHROSCOPY LEFT KNEE;  Surgeon: Maude KANDICE Herald, MD;  Location: [facility];  Service: Orthopedics;  Laterality: Left;  partial medial and partial lateral chondroplasty and removal loose body   right shoulder  2003   bone spur removed-rcr   TONSILLECTOMY     TOTAL KNEE ARTHROPLASTY Right 11/26/2015   Procedure: TOTAL KNEE ARTHROPLASTY;  Surgeon: Maude Herald, MD;  Location: [facility];  Service: Orthopedics;  Laterality: Right;   WRIST FRACTURE SURGERY  2003   left-lipoma    FAMILY HISTORY: Family History  Problem Relation Age of Onset   Heart disease Mother        died at age 77   Alzheimer's disease Mother    Testicular cancer Father    Alzheimer's disease Sister    Cancer Sister 63       breast   Breast cancer Sister    Cancer Sister 75       breast cancer   Breast cancer Daughter 59   Colon cancer Son    Breast cancer Niece    Breast cancer Niece      SOCIAL HISTORY: Social History   Socioeconomic History   Marital status: Married    Spouse name: Not on file   Number of children: Not on file   Years of education: Not on file   Highest education level: Not on file  Occupational History   Occupation: Retired  Tobacco Use   Smoking status: Never   Smokeless tobacco: Never  Vaping Use   Vaping status: Never Used  Substance and Sexual Activity   Alcohol use: Not Currently    Comment: ocassionally   Drug use: No   Sexual activity: Not on file  Other Topics Concern   Not on file  Social History Narrative   Not on file   Social Drivers of Health   Financial Resource Strain: Not on file  Food Insecurity: Not on file  Transportation Needs: Not on file  Physical Activity: Not on file  Stress: Not on file  Social Connections: Not on file  Intimate Partner Violence: Not on file      Modena Callander, M.D. Ph.D.  [facility] [address] [place], [place] [address] Ph: [phone] Fax: [phone]  CC:  Barbra Odor, NP [address] [place],  [place] [address]  Barbra Odor, NP   I personally spent a total of 40 minutes in the care of the patient today including preparing to see the patient, getting/reviewing separately obtained history, performing a medically appropriate exam/evaluation, counseling and educating, placing orders, referring and communicating with other health care professionals, documenting clinical information in the EHR, independently interpreting results, communicating results, and coordinating care.

## 2023-11-23 ENCOUNTER — Telehealth: Payer: Self-pay | Admitting: Neurology

## 2023-11-23 NOTE — Telephone Encounter (Signed)
 [facility] is taking this patient.

## 2023-11-23 NOTE — Telephone Encounter (Signed)
Referral for pain clinic fax to [facility]. Phone: [phone], Fax: [phone]. 

## 2023-11-30 NOTE — Telephone Encounter (Signed)
 I have no way to contact Alexandria Davies. No phone number documented

## 2023-11-30 NOTE — Telephone Encounter (Signed)
 [name] from [facility] called needing VO for PT with the frequency of 1 X [phone] Please advise.

## 2023-12-01 NOTE — Telephone Encounter (Signed)
 PT Hadassah from [facility] has called back, her # with a secure vm is [phone], please call

## 2023-12-01 NOTE — Telephone Encounter (Signed)
 Returned call to [facility] from [facility] and approved verbal orders

## 2024-03-06 ENCOUNTER — Telehealth: Payer: Self-pay | Admitting: Neurology

## 2024-03-06 NOTE — Telephone Encounter (Signed)
 I called pt.  She said that she has had pain (nerve pain) due to L wound (about 5 yrs ago (deep cut).  She states the inside of her leg burns. She had appt VV 9-2025and returned to her pcp.  I relayed that we did not have an appt with her (she mentioned march/or may was offered) she said that was too far out.  She said that she had appt with wound center tomorrow at 1330 near [facility]. Looks like her pcp made the referral.  I asked if she was taking trileptal  and she said that she had stopped that since she had allergic reaction. (Rash). She had gone to [facility] for epidural injections for back pain and that helped her back for about 2 months.  I asked if she needed me to call her daughter.  She said she would see how the appt goes tomorrow and then let us  know.

## 2024-03-06 NOTE — Telephone Encounter (Signed)
 Pt daughter called stating that Pt is having trouble with her leg the patient leg feel like it burning and not sure what to do . They are requesting to  get a sooner appt . Appt offered was to far out

## 2024-03-07 ENCOUNTER — Encounter (HOSPITAL_BASED_OUTPATIENT_CLINIC_OR_DEPARTMENT_OTHER): Attending: Internal Medicine | Admitting: Internal Medicine

## 2024-03-07 DIAGNOSIS — I87302 Chronic venous hypertension (idiopathic) without complications of left lower extremity: Secondary | ICD-10-CM | POA: Insufficient documentation

## 2024-03-07 DIAGNOSIS — M79605 Pain in left leg: Secondary | ICD-10-CM | POA: Diagnosis present

## 2024-03-07 DIAGNOSIS — M549 Dorsalgia, unspecified: Secondary | ICD-10-CM | POA: Insufficient documentation
